# Patient Record
Sex: Male | Born: 1965 | Hispanic: No | Marital: Married | Smoking: Never smoker
Health system: Southern US, Community
[De-identification: ages and names within clinical notes are randomized; demographics above are authoritative.]

## PROBLEM LIST (undated history)

## (undated) DIAGNOSIS — E785 Hyperlipidemia, unspecified: Secondary | ICD-10-CM

## (undated) DIAGNOSIS — R202 Paresthesia of skin: Secondary | ICD-10-CM

## (undated) DIAGNOSIS — K838 Other specified diseases of biliary tract: Secondary | ICD-10-CM

## (undated) DIAGNOSIS — R200 Anesthesia of skin: Secondary | ICD-10-CM

## (undated) DIAGNOSIS — R7303 Prediabetes: Secondary | ICD-10-CM

## (undated) DIAGNOSIS — I10 Essential (primary) hypertension: Secondary | ICD-10-CM

## (undated) DIAGNOSIS — R51 Headache: Secondary | ICD-10-CM

## (undated) DIAGNOSIS — F419 Anxiety disorder, unspecified: Secondary | ICD-10-CM

## (undated) HISTORY — PX: HAND SURGERY: SHX662

## (undated) HISTORY — DX: Hyperlipidemia, unspecified: E78.5

## (undated) HISTORY — PX: COLONOSCOPY: SHX174

## (undated) HISTORY — DX: Essential (primary) hypertension: I10

## (undated) HISTORY — DX: Other specified diseases of biliary tract: K83.8

## (undated) HISTORY — DX: Prediabetes: R73.03

## (undated) HISTORY — DX: Anxiety disorder, unspecified: F41.9

## (undated) SURGERY — Surgical Case
Anesthesia: *Unknown

---

## 2000-03-24 ENCOUNTER — Emergency Department (HOSPITAL_COMMUNITY): Admission: EM | Admit: 2000-03-24 | Discharge: 2000-03-24 | Payer: Self-pay

## 2000-03-24 ENCOUNTER — Encounter: Payer: Self-pay | Admitting: Emergency Medicine

## 2009-04-19 ENCOUNTER — Emergency Department (HOSPITAL_COMMUNITY): Admission: EM | Admit: 2009-04-19 | Discharge: 2009-04-20 | Payer: Self-pay | Admitting: Emergency Medicine

## 2012-06-16 ENCOUNTER — Ambulatory Visit (INDEPENDENT_AMBULATORY_CARE_PROVIDER_SITE_OTHER): Payer: BC Managed Care – PPO | Admitting: Family Medicine

## 2012-06-16 ENCOUNTER — Ambulatory Visit
Admission: RE | Admit: 2012-06-16 | Discharge: 2012-06-16 | Disposition: A | Discharge status: Home / Self Care | Payer: BC Managed Care – PPO | Source: Ambulatory Visit | Attending: Family Medicine | Admitting: Family Medicine

## 2012-06-16 VITALS — BP 170/120 | HR 88 | Temp 98.1°F | Resp 18 | Ht 62.0 in | Wt 163.0 lb

## 2012-06-16 DIAGNOSIS — R030 Elevated blood-pressure reading, without diagnosis of hypertension: Secondary | ICD-10-CM

## 2012-06-16 DIAGNOSIS — Z0000 Encounter for general adult medical examination without abnormal findings: Secondary | ICD-10-CM

## 2012-06-16 DIAGNOSIS — R1013 Epigastric pain: Secondary | ICD-10-CM

## 2012-06-16 DIAGNOSIS — IMO0001 Reserved for inherently not codable concepts without codable children: Secondary | ICD-10-CM

## 2012-06-16 LAB — POCT CBC
Granulocyte percent: 73.5 %G (ref 37–80)
HCT, POC: 52.2 % (ref 43.5–53.7)
Hemoglobin: 17.2 g/dL (ref 14.1–18.1)
Lymph, poc: 1.5 (ref 0.6–3.4)
MCH, POC: 30.2 pg (ref 27–31.2)
MCHC: 33 g/dL (ref 31.8–35.4)
MCV: 91.5 fL (ref 80–97)
MID (cbc): 0.4 (ref 0–0.9)
MPV: 8.3 fL (ref 0–99.8)
POC Granulocyte: 5.3 (ref 2–6.9)
POC LYMPH PERCENT: 20.3 %L (ref 10–50)
POC MID %: 6.2 %M (ref 0–12)
Platelet Count, POC: 274 10*3/uL (ref 142–424)
RBC: 5.7 M/uL (ref 4.69–6.13)
RDW, POC: 14.4 %
WBC: 7.2 10*3/uL (ref 4.6–10.2)

## 2012-06-16 LAB — COMPREHENSIVE METABOLIC PANEL
ALT: 172 U/L — ABNORMAL HIGH (ref 0–53)
AST: 118 U/L — ABNORMAL HIGH (ref 0–37)
Albumin: 4.5 g/dL (ref 3.5–5.2)
Alkaline Phosphatase: 76 U/L (ref 39–117)
BUN: 11 mg/dL (ref 6–23)
CO2: 27 mEq/L (ref 19–32)
Calcium: 9.8 mg/dL (ref 8.4–10.5)
Chloride: 103 mEq/L (ref 96–112)
Creat: 0.95 mg/dL (ref 0.50–1.35)
Glucose, Bld: 117 mg/dL — ABNORMAL HIGH (ref 70–99)
Potassium: 3.6 mEq/L (ref 3.5–5.3)
Sodium: 143 mEq/L (ref 135–145)
Total Bilirubin: 0.5 mg/dL (ref 0.3–1.2)
Total Protein: 7.5 g/dL (ref 6.0–8.3)

## 2012-06-16 LAB — POCT URINALYSIS DIPSTICK
Bilirubin, UA: NEGATIVE
Glucose, UA: NEGATIVE
Ketones, UA: NEGATIVE
Leukocytes, UA: NEGATIVE
Nitrite, UA: NEGATIVE
Protein, UA: 100
Spec Grav, UA: 1.02
Urobilinogen, UA: 0.2
pH, UA: 7

## 2012-06-16 LAB — POCT UA - MICROSCOPIC ONLY
Bacteria, U Microscopic: NEGATIVE
Casts, Ur, LPF, POC: NEGATIVE
Crystals, Ur, HPF, POC: NEGATIVE
Mucus, UA: NEGATIVE
Yeast, UA: NEGATIVE

## 2012-06-16 LAB — LIPID PANEL
Cholesterol: 269 mg/dL — ABNORMAL HIGH (ref 0–200)
HDL: 43 mg/dL (ref >39)
LDL Cholesterol: 180 mg/dL — ABNORMAL HIGH (ref 0–99)
Total CHOL/HDL Ratio: 6.3 Ratio
Triglycerides: 232 mg/dL — ABNORMAL HIGH (ref <150)
VLDL: 46 mg/dL — ABNORMAL HIGH (ref 0–40)

## 2012-06-16 MED ORDER — LISINOPRIL 40 MG PO TABS: 40.0000 mg | ORAL_TABLET | Freq: Every day | ORAL | Status: DC

## 2012-06-16 NOTE — Progress Notes (Signed)
Patient ID: Ethan Silva MRN: [id number], DOB: 1965/07/01 47 y.o. Date of Encounter: 06/16/2012, 10:48 AM  Primary Physician: No primary provider on file.  Chief Complaint: Physical (CPE)  HPI: 47 y.o. y/o male with history noted below here for CPE.  Works as a Education administrator Has had epigastric pain from time to time associated with nausea and vomiting, worse after eating.  Review of Systems: Consitutional: No fever, chills, fatigue, night sweats, lymphadenopathy, or weight changes. Eyes: No visual changes, eye redness, or discharge. ENT/Mouth: Ears: No otalgia, tinnitus, hearing loss, discharge. Nose: No congestion, rhinorrhea, sinus pain, or epistaxis. Throat: No sore throat, post nasal drip, or teeth pain. Cardiovascular: No CP, palpitations, diaphoresis, DOE, edema, orthopnea, PND. Respiratory: No cough, hemoptysis, SOB, or wheezing. Gastrointestinal: No anorexia, dysphagia, reflux, pain,  hematemesis, diarrhea, constipation, BRBPR, or melena. Genitourinary: No dysuria, frequency, urgency, hematuria, incontinence, nocturia, decreased urinary stream, discharge, impotence, or testicular pain/masses. Musculoskeletal: No decreased ROM, myalgias, stiffness, joint swelling, or weakness. Skin: No rash, erythema, lesion changes, pain, warmth, jaundice, or pruritis. Neurological: No headache, dizziness, syncope, seizures, tremors, memory loss, coordination problems, or paresthesias. Psychological: No anxiety, depression, hallucinations, SI/HI. Endocrine: No fatigue, polydipsia, polyphagia, polyuria, or known diabetes. All other systems were reviewed and are otherwise negative.  Past Medical History  Diagnosis Date  . Hyperlipidemia   . Hypertension      History reviewed. No pertinent past surgical history.  Home Meds:  Prior to Admission medications   Medication Sig Start Date End Date Taking? Authorizing Provider  pravastatin (PRAVACHOL) 40 MG tablet Take 40 mg by mouth daily.    Yes Historical Provider, MD    Allergies: No Known Allergies  History   Social History  . Marital Status: Married    Spouse Name: N/A    Number of Children: N/A  . Years of Education: N/A   Occupational History  . Not on file.   Social History Main Topics  . Smoking status: Never Smoker   . Smokeless tobacco: Not on file  . Alcohol Use: No  . Drug Use: No  . Sexually Active: Not on file   Other Topics Concern  . Not on file   Social History Narrative  . No narrative on file    History reviewed. No pertinent family history.  Physical Exam:  200/118 rcheck BP Blood pressure 170/120, pulse 88, temperature 98.1 F (36.7 C), temperature source Oral, resp. rate 18, height 5\' 2"  (1.575 m), weight 163 lb (73.936 kg).  General: Well developed, well nourished, in no acute distress. HEENT: Normocephalic, atraumatic. Conjunctiva pink, sclera non-icteric. Pupils 2 mm constricting to 1 mm, round, regular, and equally reactive to light and accomodation. EOMI. Internal auditory canal clear. TMs with good cone of light and without pathology. Nasal mucosa pink. Nares are without discharge. No sinus tenderness. Oral mucosa pink. Dentition good. Pharynx without exudate.   Neck: Supple. Trachea midline. No thyromegaly. Full ROM. No lymphadenopathy. Lungs: Clear to auscultation bilaterally without wheezes, rales, or rhonchi. Breathing is of normal effort and unlabored. Cardiovascular: RRR with S1 S2. No murmurs, rubs, or gallops appreciated. Distal pulses 2+ symmetrically. No carotid or abdominal bruits Abdomen: Soft, non-distended with normoactive bowel sounds. No hepatosplenomegaly or masses. No rebound/guarding. No CVA tenderness. Without hernias.  Genitourinary:  uncircumcised male. No penile lesions. Testes descended bilaterally, and smooth without tenderness or masses.  Musculoskeletal: Full range of motion and 5/5 strength throughout. Without swelling, atrophy, tenderness, crepitus, or  warmth. Extremities without clubbing, cyanosis, or edema.  Calves supple. Skin: Warm and moist without erythema, ecchymosis, wounds, or rash. Neuro: A+Ox3. CN II-XII grossly intact. Moves all extremities spontaneously. Full sensation throughout. Normal gait. DTR 2+ throughout upper and lower extremities. Finger to nose intact. Psych:  Responds to questions appropriately with a normal affect.   Studies: CBC, CMET, Lipid, UA:  Ekg:  No acute changes  Assessment/Plan:  47 y.o. y/o  male here for CPE -  Signed, Elvina Sidle, MD 06/16/2012 10:48 AM

## 2012-06-17 ENCOUNTER — Other Ambulatory Visit: Payer: Self-pay | Admitting: Family Medicine

## 2012-06-17 DIAGNOSIS — K8020 Calculus of gallbladder without cholecystitis without obstruction: Secondary | ICD-10-CM

## 2012-06-26 ENCOUNTER — Encounter (INDEPENDENT_AMBULATORY_CARE_PROVIDER_SITE_OTHER): Payer: Self-pay | Admitting: General Surgery

## 2012-06-26 ENCOUNTER — Ambulatory Visit (INDEPENDENT_AMBULATORY_CARE_PROVIDER_SITE_OTHER): Payer: BC Managed Care – PPO | Admitting: General Surgery

## 2012-06-26 VITALS — BP 142/98 | HR 62 | Temp 98.0°F | Resp 18 | Ht 62.0 in | Wt 163.0 lb

## 2012-06-26 DIAGNOSIS — K8020 Calculus of gallbladder without cholecystitis without obstruction: Secondary | ICD-10-CM

## 2012-06-26 NOTE — Progress Notes (Signed)
Patient ID: Ethan Silva, male   DOB: April 08, 1965, 47 y.o.   MRN: [id number]  Chief Complaint  Patient presents with  . New Evaluation    Gallbladder    HPI Ethan Silva is a 47 y.o. male.  Is a 47 year old male who is referred by Dr. Milus Glazier for evaluation of gallstones. The patient states that after 2-3. High-fat foods, or spicy foods, he has epigastric pain. This has occurred over the last 2-3 months, with increasing frequency. The patient does have some nausea that time. HPI  Past Medical History  Diagnosis Date  . Hyperlipidemia   . Hypertension     History reviewed. No pertinent past surgical history.  Family History  Problem Relation Age of Onset  . Gallstones Mother     Social History History  Substance Use Topics  . Smoking status: Never Smoker   . Smokeless tobacco: Not on file  . Alcohol Use: No    No Known Allergies  Current Outpatient Prescriptions  Medication Sig Dispense Refill  . lisinopril (PRINIVIL,ZESTRIL) 40 MG tablet Take 1 tablet (40 mg total) by mouth daily.  90 tablet  3  . pravastatin (PRAVACHOL) 40 MG tablet Take 40 mg by mouth daily.       No current facility-administered medications for this visit.    Review of Systems Review of Systems  Constitutional: Negative.   HENT: Negative.   Respiratory: Negative.   Cardiovascular: Negative.   Gastrointestinal: Positive for abdominal pain.  Musculoskeletal: Negative.   Neurological: Negative.   All other systems reviewed and are negative.    Blood pressure 142/98, pulse 62, temperature 98 F (36.7 C), resp. rate 18, height 5\' 2"  (1.575 m), weight 163 lb (73.936 kg).  Physical Exam Physical Exam  Constitutional: He is oriented to person, place, and time. He appears well-developed and well-nourished.  HENT:  Head: Normocephalic and atraumatic.  Eyes: Conjunctivae and EOM are normal. Pupils are equal, round, and reactive to light.  Neck: Normal range of motion. Neck  supple.  Cardiovascular: Normal rate, regular rhythm and normal heart sounds.   Pulmonary/Chest: Effort normal and breath sounds normal.  Abdominal: Soft. Bowel sounds are normal. There is no tenderness. There is no rebound.  Musculoskeletal: Normal range of motion.  Neurological: He is alert and oriented to person, place, and time.  Skin: Skin is warm and dry.    Data Reviewed AST and ALT slightly elevated. T. Bili within normal limits, alkaline phosphatase normal limits  Ultrasound reveals gallstones  Assessment    47 year old male with symptomatic cholelithiasis     Plan    1. We'll proceed to the operating room for a laparoscopic cholecystectomy with IOC. 2.All risks and benefits were discussed with the patient to generally include: infection, bleeding, possible need for post op ERCP, damage to the bile ducts, and bile leak. Alternatives were offered and described.  All questions were answered and the patient voiced understanding of the procedure and wishes to proceed at this point with a laparoscopic cholecystectomy         Ethan Silva., Ethan Silva 06/26/2012, 9:20 AM

## 2012-07-01 DIAGNOSIS — I350 Nonrheumatic aortic (valve) stenosis: Secondary | ICD-10-CM

## 2012-07-01 HISTORY — DX: Nonrheumatic aortic (valve) stenosis: I35.0

## 2012-07-09 ENCOUNTER — Ambulatory Visit: Admit: 2012-07-09 | Payer: Self-pay | Admitting: General Surgery

## 2012-07-09 SURGERY — LAPAROSCOPIC CHOLECYSTECTOMY WITH INTRAOPERATIVE CHOLANGIOGRAM
Anesthesia: General

## 2012-07-10 ENCOUNTER — Encounter (HOSPITAL_COMMUNITY): Payer: Self-pay

## 2012-07-10 ENCOUNTER — Encounter (HOSPITAL_COMMUNITY)
Admission: RE | Admit: 2012-07-10 | Discharge: 2012-07-10 | Disposition: A | Discharge status: Home / Self Care | Payer: BC Managed Care – PPO | Source: Ambulatory Visit | Attending: General Surgery | Admitting: General Surgery

## 2012-07-10 ENCOUNTER — Ambulatory Visit (HOSPITAL_COMMUNITY)
Admission: RE | Admit: 2012-07-10 | Discharge: 2012-07-10 | Disposition: A | Discharge status: Home / Self Care | Payer: BC Managed Care – PPO | Source: Ambulatory Visit | Attending: Anesthesiology | Admitting: Anesthesiology

## 2012-07-10 DIAGNOSIS — Z01812 Encounter for preprocedural laboratory examination: Secondary | ICD-10-CM | POA: Insufficient documentation

## 2012-07-10 DIAGNOSIS — Z01818 Encounter for other preprocedural examination: Secondary | ICD-10-CM | POA: Insufficient documentation

## 2012-07-10 DIAGNOSIS — I10 Essential (primary) hypertension: Secondary | ICD-10-CM | POA: Insufficient documentation

## 2012-07-10 HISTORY — DX: Paresthesia of skin: R20.0

## 2012-07-10 HISTORY — DX: Headache: R51

## 2012-07-10 HISTORY — DX: Anesthesia of skin: R20.2

## 2012-07-10 LAB — CBC
HCT: 45.4 % (ref 39.0–52.0)
Hemoglobin: 16.1 g/dL (ref 13.0–17.0)
MCH: 30.1 pg (ref 26.0–34.0)
MCHC: 35.5 g/dL (ref 30.0–36.0)
MCV: 85 fL (ref 78.0–100.0)
Platelets: 203 10*3/uL (ref 150–400)
RBC: 5.34 MIL/uL (ref 4.22–5.81)
RDW: 13.1 % (ref 11.5–15.5)
WBC: 8.7 10*3/uL (ref 4.0–10.5)

## 2012-07-10 LAB — BASIC METABOLIC PANEL
BUN: 17 mg/dL (ref 6–23)
CO2: 27 mEq/L (ref 19–32)
Calcium: 9.8 mg/dL (ref 8.4–10.5)
Chloride: 102 mEq/L (ref 96–112)
Creatinine, Ser: 1.03 mg/dL (ref 0.50–1.35)
GFR calc Af Amer: >90 mL/min (ref >90)
GFR calc non Af Amer: 84 mL/min — ABNORMAL LOW (ref >90)
Glucose, Bld: 104 mg/dL — ABNORMAL HIGH (ref 70–99)
Potassium: 3.6 mEq/L (ref 3.5–5.1)
Sodium: 141 mEq/L (ref 135–145)

## 2012-07-10 MED ORDER — CEFAZOLIN SODIUM-DEXTROSE 2-3 GM-% IV SOLR
2.0000 g | INTRAVENOUS | Status: AC
  Administered 2012-07-11: 2 g via INTRAVENOUS
  Filled 2012-07-10: qty 50

## 2012-07-10 NOTE — Pre-Procedure Instructions (Signed)
Ethan Silva  07/10/2012   Your procedure is scheduled on: Friday, July 11t   Report to [facility] at  5:30 AM.             Come into Entrance "A", follow signs to EAST Elevators and go to 3rd floor.   Call this number if you have problems the morning of surgery: [phone]   Remember:   Do not eat food or drink liquids after midnight Thursday.   Take these medicines the morning of surgery with A SIP OF WATER: None   Do not wear jewelry.  Do not wear lotions, powders, or colognes. You may NOT wear deodorant.   Men may shave face and neck.   Do not bring valuables to the hospital.  [facility] is not responsible for any belongings or valuables.  Contacts, dentures or bridgework may not be worn into surgery.  Leave suitcase in the car. After surgery it may be brought to your room.   For patients admitted to the hospital, checkout time is 11:00 AM the day of discharge.   Patients discharged the day of surgery will not be allowed to drive home,             And will need a adult to stay with you for the first 24 hrs after surgery.   Name and phone number of your driver:    Special Instructions: Shower using CHG 2 nights before surgery and the night before surgery.  If you shower the day of surgery use CHG.  Use special wash - you have one bottle of CHG for all showers.  You should use approximately 1/3 of the bottle for each shower.   Please read over the following fact sheets that you were given: Pain Booklet and Surgical Site Infection Prevention

## 2012-07-10 NOTE — Progress Notes (Signed)
Pt denies SOB, chest pain, being under the care of a cardiologist, having a stress, echo, or cardiac cath done.

## 2012-07-11 ENCOUNTER — Encounter (HOSPITAL_COMMUNITY): Payer: Self-pay | Admitting: Critical Care Medicine

## 2012-07-11 ENCOUNTER — Ambulatory Visit (HOSPITAL_COMMUNITY): Payer: BC Managed Care – PPO

## 2012-07-11 ENCOUNTER — Ambulatory Visit (HOSPITAL_COMMUNITY)
Admission: RE | Admit: 2012-07-11 | Discharge: 2012-07-11 | Disposition: A | Discharge status: Home / Self Care | Payer: BC Managed Care – PPO | Source: Ambulatory Visit | Attending: General Surgery | Admitting: General Surgery

## 2012-07-11 ENCOUNTER — Encounter (HOSPITAL_COMMUNITY)
Admission: RE | Disposition: A | Discharge status: Home / Self Care | Payer: Self-pay | Source: Ambulatory Visit | Attending: General Surgery

## 2012-07-11 ENCOUNTER — Ambulatory Visit (HOSPITAL_COMMUNITY): Payer: BC Managed Care – PPO | Admitting: Critical Care Medicine

## 2012-07-11 DIAGNOSIS — K219 Gastro-esophageal reflux disease without esophagitis: Secondary | ICD-10-CM | POA: Insufficient documentation

## 2012-07-11 DIAGNOSIS — Z79899 Other long term (current) drug therapy: Secondary | ICD-10-CM | POA: Insufficient documentation

## 2012-07-11 DIAGNOSIS — K8010 Calculus of gallbladder with chronic cholecystitis without obstruction: Secondary | ICD-10-CM

## 2012-07-11 DIAGNOSIS — K8020 Calculus of gallbladder without cholecystitis without obstruction: Secondary | ICD-10-CM | POA: Insufficient documentation

## 2012-07-11 DIAGNOSIS — E785 Hyperlipidemia, unspecified: Secondary | ICD-10-CM | POA: Insufficient documentation

## 2012-07-11 DIAGNOSIS — I10 Essential (primary) hypertension: Secondary | ICD-10-CM | POA: Insufficient documentation

## 2012-07-11 HISTORY — PX: CHOLECYSTECTOMY: SHX55

## 2012-07-11 SURGERY — LAPAROSCOPIC CHOLECYSTECTOMY WITH INTRAOPERATIVE CHOLANGIOGRAM
Anesthesia: General | Site: Abdomen | Wound class: Clean Contaminated

## 2012-07-11 MED ORDER — HYDROMORPHONE HCL PF 1 MG/ML IJ SOLN
0.2500 mg | INTRAMUSCULAR | Status: DC | PRN
  Administered 2012-07-11 (×2): 0.5 mg via INTRAVENOUS

## 2012-07-11 MED ORDER — LIDOCAINE HCL 4 % MT SOLN
OROMUCOSAL | Status: DC | PRN
  Administered 2012-07-11: 4 mL via TOPICAL

## 2012-07-11 MED ORDER — 0.9 % SODIUM CHLORIDE (POUR BTL) OPTIME
TOPICAL | Status: DC | PRN
  Administered 2012-07-11: 1000 mL

## 2012-07-11 MED ORDER — FENTANYL CITRATE 0.05 MG/ML IJ SOLN
INTRAMUSCULAR | Status: DC | PRN
  Administered 2012-07-11: 50 ug via INTRAVENOUS
  Administered 2012-07-11: 100 ug via INTRAVENOUS

## 2012-07-11 MED ORDER — LIDOCAINE HCL (CARDIAC) 20 MG/ML IV SOLN
INTRAVENOUS | Status: DC | PRN
  Administered 2012-07-11: 60 mg via INTRAVENOUS

## 2012-07-11 MED ORDER — NEOSTIGMINE METHYLSULFATE 1 MG/ML IJ SOLN
INTRAMUSCULAR | Status: DC | PRN
  Administered 2012-07-11: 3 mg via INTRAVENOUS

## 2012-07-11 MED ORDER — ONDANSETRON HCL 4 MG/2ML IJ SOLN
INTRAMUSCULAR | Status: DC | PRN
  Administered 2012-07-11: 4 mg via INTRAVENOUS

## 2012-07-11 MED ORDER — SODIUM CHLORIDE 0.9 % IR SOLN
Status: DC | PRN
  Administered 2012-07-11: 1000 mL

## 2012-07-11 MED ORDER — ROCURONIUM BROMIDE 100 MG/10ML IV SOLN
INTRAVENOUS | Status: DC | PRN
  Administered 2012-07-11: 10 mg via INTRAVENOUS
  Administered 2012-07-11: 30 mg via INTRAVENOUS

## 2012-07-11 MED ORDER — IOHEXOL 300 MG/ML  SOLN
INTRAMUSCULAR | Status: DC | PRN
  Administered 2012-07-11: 50 mL

## 2012-07-11 MED ORDER — OXYCODONE HCL 5 MG PO TABS: 5.0000 mg | ORAL_TABLET | ORAL | Status: DC | PRN

## 2012-07-11 MED ORDER — LACTATED RINGERS IV SOLN: INTRAVENOUS | Status: DC | PRN

## 2012-07-11 MED ORDER — SODIUM CHLORIDE 0.9 % IJ SOLN: 3.0000 mL | Freq: Two times a day (BID) | INTRAMUSCULAR | Status: DC

## 2012-07-11 MED ORDER — ACETAMINOPHEN 325 MG PO TABS: 650.0000 mg | ORAL_TABLET | ORAL | Status: DC | PRN

## 2012-07-11 MED ORDER — ONDANSETRON HCL 4 MG/2ML IJ SOLN: 4.0000 mg | Freq: Once | INTRAMUSCULAR | Status: DC | PRN

## 2012-07-11 MED ORDER — ONDANSETRON HCL 4 MG/2ML IJ SOLN: 4.0000 mg | Freq: Four times a day (QID) | INTRAMUSCULAR | Status: DC | PRN

## 2012-07-11 MED ORDER — BUPIVACAINE HCL (PF) 0.25 % IJ SOLN
INTRAMUSCULAR | Status: AC
  Filled 2012-07-11: qty 30

## 2012-07-11 MED ORDER — SODIUM CHLORIDE 0.9 % IJ SOLN: 3.0000 mL | INTRAMUSCULAR | Status: DC | PRN

## 2012-07-11 MED ORDER — MIDAZOLAM HCL 5 MG/5ML IJ SOLN
INTRAMUSCULAR | Status: DC | PRN
  Administered 2012-07-11: 2 mg via INTRAVENOUS

## 2012-07-11 MED ORDER — PROPOFOL 10 MG/ML IV BOLUS
INTRAVENOUS | Status: DC | PRN
  Administered 2012-07-11: 200 mg via INTRAVENOUS

## 2012-07-11 MED ORDER — PHENYLEPHRINE HCL 10 MG/ML IJ SOLN
INTRAMUSCULAR | Status: DC | PRN
  Administered 2012-07-11: 80 ug via INTRAVENOUS
  Administered 2012-07-11: 120 ug via INTRAVENOUS
  Administered 2012-07-11 (×4): 80 ug via INTRAVENOUS

## 2012-07-11 MED ORDER — SODIUM CHLORIDE 0.9 % IV SOLN: 250.0000 mL | INTRAVENOUS | Status: DC | PRN

## 2012-07-11 MED ORDER — CHLORHEXIDINE GLUCONATE 4 % EX LIQD: 1.0000 "application " | Freq: Once | CUTANEOUS | Status: DC

## 2012-07-11 MED ORDER — HYDROMORPHONE HCL PF 1 MG/ML IJ SOLN
INTRAMUSCULAR | Status: AC
  Filled 2012-07-11: qty 1

## 2012-07-11 MED ORDER — GLYCOPYRROLATE 0.2 MG/ML IJ SOLN
INTRAMUSCULAR | Status: DC | PRN
  Administered 2012-07-11: 0.1 mg via INTRAVENOUS
  Administered 2012-07-11: 0.4 mg via INTRAVENOUS
  Administered 2012-07-11: 0.1 mg via INTRAVENOUS

## 2012-07-11 MED ORDER — SUCCINYLCHOLINE CHLORIDE 20 MG/ML IJ SOLN
INTRAMUSCULAR | Status: DC | PRN
  Administered 2012-07-11: 80 mg via INTRAVENOUS

## 2012-07-11 MED ORDER — BUPIVACAINE HCL 0.25 % IJ SOLN
INTRAMUSCULAR | Status: DC | PRN
  Administered 2012-07-11: 30 mL

## 2012-07-11 MED ORDER — OXYCODONE-ACETAMINOPHEN 10-325 MG PO TABS: 1.0000 | ORAL_TABLET | ORAL | Status: DC | PRN

## 2012-07-11 MED ORDER — ACETAMINOPHEN 650 MG RE SUPP: 650.0000 mg | RECTAL | Status: DC | PRN

## 2012-07-11 SURGICAL SUPPLY — 53 items
APL SKNCLS STERI-STRIP NONHPOA (GAUZE/BANDAGES/DRESSINGS) ×1
APPLIER CLIP 5 13 M/L LIGAMAX5 (MISCELLANEOUS) ×2
APR CLP MED LRG 5 ANG JAW (MISCELLANEOUS) ×1
BAG SPEC RTRVL LRG 6X4 10 (ENDOMECHANICALS)
BENZOIN TINCTURE PRP APPL 2/3 (GAUZE/BANDAGES/DRESSINGS) ×2 IMPLANT
CANISTER SUCTION 2500CC (MISCELLANEOUS) ×2 IMPLANT
CHLORAPREP W/TINT 26ML (MISCELLANEOUS) ×2 IMPLANT
CLIP APPLIE 5 13 M/L LIGAMAX5 (MISCELLANEOUS) ×1 IMPLANT
CLOTH BEACON ORANGE TIMEOUT ST (SAFETY) ×2 IMPLANT
COVER MAYO STAND STRL (DRAPES) ×2 IMPLANT
COVER SURGICAL LIGHT HANDLE (MISCELLANEOUS) ×2 IMPLANT
COVER TRANSDUCER ULTRASND (DRAPES) ×1 IMPLANT
DEVICE TROCAR PUNCTURE CLOSURE (ENDOMECHANICALS) ×2 IMPLANT
DRAPE C-ARM 42X72 X-RAY (DRAPES) ×2 IMPLANT
DRAPE UTILITY 15X26 W/TAPE STR (DRAPE) ×4 IMPLANT
ELECT REM PT RETURN 9FT ADLT (ELECTROSURGICAL) ×2
ELECTRODE REM PT RTRN 9FT ADLT (ELECTROSURGICAL) ×1 IMPLANT
GAUZE SPONGE 2X2 8PLY STRL LF (GAUZE/BANDAGES/DRESSINGS) ×1 IMPLANT
GLOVE BIO SURGEON STRL SZ7.5 (GLOVE) ×2 IMPLANT
GLOVE BIOGEL PI IND STRL 6.5 (GLOVE) IMPLANT
GLOVE BIOGEL PI IND STRL 7.0 (GLOVE) IMPLANT
GLOVE BIOGEL PI INDICATOR 6.5 (GLOVE) ×1
GLOVE BIOGEL PI INDICATOR 7.0 (GLOVE) ×2
GLOVE SKINSENSE NS SZ6.5 (GLOVE) ×1
GLOVE SKINSENSE NS SZ7.0 (GLOVE) ×2
GLOVE SKINSENSE STRL SZ6.5 (GLOVE) IMPLANT
GLOVE SKINSENSE STRL SZ7.0 (GLOVE) IMPLANT
GOWN STRL NON-REIN LRG LVL3 (GOWN DISPOSABLE) ×6 IMPLANT
GOWN STRL REIN XL XLG (GOWN DISPOSABLE) ×2 IMPLANT
IV CATH 14GX2 1/4 (CATHETERS) ×2 IMPLANT
KIT BASIN OR (CUSTOM PROCEDURE TRAY) ×2 IMPLANT
KIT ROOM TURNOVER OR (KITS) ×2 IMPLANT
NDL INSUFFLATION 14GA 120MM (NEEDLE) ×1 IMPLANT
NEEDLE INSUFFLATION 14GA 120MM (NEEDLE) ×2 IMPLANT
NS IRRIG 1000ML POUR BTL (IV SOLUTION) ×2 IMPLANT
PAD ARMBOARD 7.5X6 YLW CONV (MISCELLANEOUS) ×4 IMPLANT
POUCH SPECIMEN RETRIEVAL 10MM (ENDOMECHANICALS) IMPLANT
SCISSORS LAP 5X35 DISP (ENDOMECHANICALS) ×2 IMPLANT
SET CHOLANGIOGRAPHY FRANKLIN (SET/KITS/TRAYS/PACK) ×2 IMPLANT
SET IRRIG TUBING LAPAROSCOPIC (IRRIGATION / IRRIGATOR) ×2 IMPLANT
SLEEVE ENDOPATH XCEL 5M (ENDOMECHANICALS) ×2 IMPLANT
SPECIMEN JAR SMALL (MISCELLANEOUS) ×2 IMPLANT
SPONGE GAUZE 2X2 STER 10/PKG (GAUZE/BANDAGES/DRESSINGS) ×1
SPONGE GAUZE 4X4 12PLY (GAUZE/BANDAGES/DRESSINGS) ×1 IMPLANT
STRIP CLOSURE SKIN 1/2X4 (GAUZE/BANDAGES/DRESSINGS) ×1 IMPLANT
SUT MNCRL AB 3-0 PS2 18 (SUTURE) ×2 IMPLANT
SUT VICRYL 0 UR6 27IN ABS (SUTURE) ×1 IMPLANT
TAPE CLOTH SURG 4X10 WHT LF (GAUZE/BANDAGES/DRESSINGS) ×1 IMPLANT
TOWEL OR 17X24 6PK STRL BLUE (TOWEL DISPOSABLE) ×2 IMPLANT
TOWEL OR 17X26 10 PK STRL BLUE (TOWEL DISPOSABLE) ×2 IMPLANT
TRAY LAPAROSCOPIC (CUSTOM PROCEDURE TRAY) ×2 IMPLANT
TROCAR XCEL NON-BLD 11X100MML (ENDOMECHANICALS) ×2 IMPLANT
TROCAR XCEL NON-BLD 5MMX100MML (ENDOMECHANICALS) ×2 IMPLANT

## 2012-07-11 NOTE — Op Note (Signed)
   Pre Operative Diagnosis:  Symptomatic gallstones  Post Operative Diagnosis: same  Procedure: Lap chole with IOC  Surgeon: Dr. Axel Filler  Assistant: none  Anesthesia: GETA  EBL: 5cc  Complications: none  Counts: reported as correct x 2  Findings:  Normal IOC.  Moderately inflammed GB  Indications for procedure:  Pt is a 47 y/o M with several month h/o RUQ abd pain after fatty meals.  Pt was dx with gallstones and seen and counseled in clinic.  He decided to have this gallbladder electively removed.  Details of the procedure:  The patient was taken to the operating and placed in the supine position with bilateral SCDs in place. A time out was called and all facts were verified. A pneumoperitoneum was obtained via A Veress needle technique to a pressure of 14mm of mercury. A 5mm trochar was then placed in the right upper quadrant under visualization, and there were no injuries to any abdominal organs. A 11 mm port was then placed in the umbilical region after infiltrating with local anesthesia under direct visualization. A second epigastric port was placed under direct visualization. The gallbladder was identified and retracted, the peritoneum was then sharply dissected from the gallbladder and this dissection was carried down to Calot's triangle. The cystic duct was identified and stripped away circumferentially and seen going into the gallbladder 360, and the critical angle was obtained. A Cook catheter was used to perform an intraoperative cholangiogram. The biliary radicals as well as the cystic duct and common bile duct were seen free of filling defects.  2 clips were placed proximally one distally and the cystic duct transected. The cystic artery was identified and 2 clips placed proximally and one distally and transected.  We then proceeded to remove the gallbladder off the hepatic fossa with Bovie cautery. A latex retrieval bag was then placed in the abdomen and gallbladder  placed in the bag. The hepatic fossa was then reexamined and hemostasis was achieved with laparoscopic specula and Bovie cautery and was excellent at this portion of the case. The subhepatic fossa and perihepatic fossa was then irrigated until the effluent was clear. The 11 mm trocar fascia was reapproximated with the Endo Close #1 Vicryl x3.  The pneumoperitoneum was evacuated and all trochars removed under direct visulalization.  The skin was then closed with 4-0 Monocryl and the skin dressed with Steri-Strips, gauze, and tape.  The patient was awaken from general anesthesia and taken to the recovery room in stable condition.

## 2012-07-11 NOTE — Anesthesia Postprocedure Evaluation (Signed)
  Anesthesia Post-op Note  Patient: Ethan Silva  Procedure(s) Performed: Procedure(s): LAPAROSCOPIC CHOLECYSTECTOMY WITH INTRAOPERATIVE CHOLANGIOGRAM (N/A)  Patient Location: PACU  Anesthesia Type:General  Level of Consciousness: awake, oriented and patient cooperative  Airway and Oxygen Therapy: Patient Spontanous Breathing  Post-op Pain: mild  Post-op Assessment: Post-op Vital signs reviewed, Patient's Cardiovascular Status Stable, Respiratory Function Stable, Patent Airway, No signs of Nausea or vomiting and Pain level controlled  Post-op Vital Signs: stable  Complications: No apparent anesthesia complications

## 2012-07-11 NOTE — Interval H&P Note (Signed)
History and Physical Interval Note:  07/11/2012 7:19 AM  Ethan Silva  has presented today for surgery, with the diagnosis of GALLSTONES  The various methods of treatment have been discussed with the patient and family. After consideration of risks, benefits and other options for treatment, the patient has consented to  Procedure(s): LAPAROSCOPIC CHOLECYSTECTOMY WITH INTRAOPERATIVE CHOLANGIOGRAM (N/A) as a surgical intervention .  The patient's history has been reviewed, patient examined, no change in status, stable for surgery.  I have reviewed the patient's chart and labs.  Questions were answered to the patient's satisfaction.     [name]., [name]

## 2012-07-11 NOTE — Anesthesia Procedure Notes (Signed)
Procedure Name: Intubation Date/Time: 07/11/2012 7:27 AM Performed by: Elon Alas Pre-anesthesia Checklist: Patient identified, Timeout performed, Emergency Drugs available, Suction available and Patient being monitored Patient Re-evaluated:Patient Re-evaluated prior to inductionOxygen Delivery Method: Circle system utilized Preoxygenation: Pre-oxygenation with 100% oxygen Intubation Type: IV induction Ventilation: Mask ventilation without difficulty Laryngoscope Size: Mac and 4 Grade View: Grade III Tube type: Oral Tube size: 7.5 mm Number of attempts: 1 Airway Equipment and Method: Stylet and LTA kit utilized Placement Confirmation: positive ETCO2,  ETT inserted through vocal cords under direct vision and breath sounds checked- equal and bilateral Secured at: 23 cm Tube secured with: Tape Dental Injury: Teeth and Oropharynx as per pre-operative assessment

## 2012-07-11 NOTE — Preoperative (Signed)
Beta Blockers   Reason not to administer Beta Blockers:Not Applicable 

## 2012-07-11 NOTE — Anesthesia Preprocedure Evaluation (Addendum)
Anesthesia Evaluation  Patient identified by MRN, date of birth, ID band Patient awake    Reviewed: Allergy & Precautions, H&P , Patient's Chart, lab work & pertinent test results  Airway       Dental  (+) Dental Advisory Given   Pulmonary          Cardiovascular hypertension, Pt. on medications     Neuro/Psych  Headaches,    GI/Hepatic GERD-  ,  Endo/Other    Renal/GU      Musculoskeletal   Abdominal   Peds  Hematology   Anesthesia Other Findings   Reproductive/Obstetrics                           Anesthesia Physical Anesthesia Plan  ASA: II  Anesthesia Plan: General   Post-op Pain Management:    Induction: Intravenous  Airway Management Planned: Oral ETT  Additional Equipment:   Intra-op Plan:   Post-operative Plan: Extubation in OR  Informed Consent: I have reviewed the patients History and Physical, chart, labs and discussed the procedure including the risks, benefits and alternatives for the proposed anesthesia with the patient or authorized representative who has indicated his/her understanding and acceptance.   Dental advisory given  Plan Discussed with: Anesthesiologist, Surgeon and CRNA  Anesthesia Plan Comments:        Anesthesia Quick Evaluation

## 2012-07-11 NOTE — H&P (View-Only) (Signed)
Patient ID: Ethan Silva, male   DOB: 04/12/1965, 47 y.o.   MRN: [id number]  Chief Complaint  Patient presents with  . New Evaluation    Gallbladder    HPI Ethan Silva is a 47 y.o. male.  Is a 47-year-old male who is referred by Dr. Lauenstein for evaluation of gallstones. The patient states that after 2-3. High-fat foods, or spicy foods, he has epigastric pain. This has occurred over the last 2-3 months, with increasing frequency. The patient does have some nausea that time. HPI  Past Medical History  Diagnosis Date  . Hyperlipidemia   . Hypertension     History reviewed. No pertinent past surgical history.  Family History  Problem Relation Age of Onset  . Gallstones Mother     Social History History  Substance Use Topics  . Smoking status: Never Smoker   . Smokeless tobacco: Not on file  . Alcohol Use: No    No Known Allergies  Current Outpatient Prescriptions  Medication Sig Dispense Refill  . lisinopril (PRINIVIL,ZESTRIL) 40 MG tablet Take 1 tablet (40 mg total) by mouth daily.  90 tablet  3  . pravastatin (PRAVACHOL) 40 MG tablet Take 40 mg by mouth daily.       No current facility-administered medications for this visit.    Review of Systems Review of Systems  Constitutional: Negative.   HENT: Negative.   Respiratory: Negative.   Cardiovascular: Negative.   Gastrointestinal: Positive for abdominal pain.  Musculoskeletal: Negative.   Neurological: Negative.   All other systems reviewed and are negative.    Blood pressure 142/98, pulse 62, temperature 98 F (36.7 C), resp. rate 18, height 5' 2" (1.575 m), weight 163 lb (73.936 kg).  Physical Exam Physical Exam  Constitutional: He is oriented to person, place, and time. He appears well-developed and well-nourished.  HENT:  Head: Normocephalic and atraumatic.  Eyes: Conjunctivae and EOM are normal. Pupils are equal, round, and reactive to light.  Neck: Normal range of motion. Neck  supple.  Cardiovascular: Normal rate, regular rhythm and normal heart sounds.   Pulmonary/Chest: Effort normal and breath sounds normal.  Abdominal: Soft. Bowel sounds are normal. There is no tenderness. There is no rebound.  Musculoskeletal: Normal range of motion.  Neurological: He is alert and oriented to person, place, and time.  Skin: Skin is warm and dry.    Data Reviewed AST and ALT slightly elevated. T. Bili within normal limits, alkaline phosphatase normal limits  Ultrasound reveals gallstones  Assessment    47-year-old male with symptomatic cholelithiasis     Plan    1. We'll proceed to the operating room for a laparoscopic cholecystectomy with IOC. 2.All risks and benefits were discussed with the patient to generally include: infection, bleeding, possible need for post op ERCP, damage to the bile ducts, and bile leak. Alternatives were offered and described.  All questions were answered and the patient voiced understanding of the procedure and wishes to proceed at this point with a laparoscopic cholecystectomy         Ethan Booher Jr., Ethan Silva 06/26/2012, 9:20 AM    

## 2012-07-11 NOTE — Transfer of Care (Signed)
Immediate Anesthesia Transfer of Care Note  Patient: Ethan Silva  Procedure(s) Performed: Procedure(s): LAPAROSCOPIC CHOLECYSTECTOMY WITH INTRAOPERATIVE CHOLANGIOGRAM (N/A)  Patient Location: PACU  Anesthesia Type:General  Level of Consciousness: awake, alert  and oriented  Airway & Oxygen Therapy: Patient Spontanous Breathing and Patient connected to nasal cannula oxygen  Post-op Assessment: Report given to PACU RN, Post -op Vital signs reviewed and stable and Patient moving all extremities X 4  Post vital signs: Reviewed and stable  Complications: No apparent anesthesia complications

## 2012-07-15 ENCOUNTER — Encounter (HOSPITAL_COMMUNITY): Payer: Self-pay | Admitting: General Surgery

## 2012-07-16 ENCOUNTER — Observation Stay (HOSPITAL_COMMUNITY): Payer: BC Managed Care – PPO | Admitting: Anesthesiology

## 2012-07-16 ENCOUNTER — Encounter (HOSPITAL_COMMUNITY): Payer: Self-pay | Admitting: Anesthesiology

## 2012-07-16 ENCOUNTER — Encounter (HOSPITAL_COMMUNITY)
Admission: EM | Disposition: A | Discharge status: Home / Self Care | Payer: Self-pay | Source: Home / Self Care | Attending: General Surgery

## 2012-07-16 ENCOUNTER — Emergency Department (HOSPITAL_COMMUNITY): Payer: BC Managed Care – PPO

## 2012-07-16 ENCOUNTER — Observation Stay (HOSPITAL_COMMUNITY): Payer: BC Managed Care – PPO

## 2012-07-16 ENCOUNTER — Encounter (HOSPITAL_COMMUNITY): Payer: Self-pay | Admitting: Emergency Medicine

## 2012-07-16 ENCOUNTER — Inpatient Hospital Stay (HOSPITAL_COMMUNITY)
Admission: EM | Admit: 2012-07-16 | Discharge: 2012-07-18 | DRG: 188 | Disposition: A | Discharge status: Home / Self Care | Payer: BC Managed Care – PPO | Attending: General Surgery | Admitting: General Surgery

## 2012-07-16 DIAGNOSIS — K8020 Calculus of gallbladder without cholecystitis without obstruction: Secondary | ICD-10-CM | POA: Diagnosis present

## 2012-07-16 DIAGNOSIS — I10 Essential (primary) hypertension: Secondary | ICD-10-CM | POA: Diagnosis present

## 2012-07-16 DIAGNOSIS — E785 Hyperlipidemia, unspecified: Secondary | ICD-10-CM | POA: Diagnosis present

## 2012-07-16 DIAGNOSIS — Y9210 Unspecified residential institution as the place of occurrence of the external cause: Secondary | ICD-10-CM | POA: Diagnosis present

## 2012-07-16 DIAGNOSIS — G8918 Other acute postprocedural pain: Secondary | ICD-10-CM | POA: Diagnosis present

## 2012-07-16 DIAGNOSIS — R109 Unspecified abdominal pain: Secondary | ICD-10-CM

## 2012-07-16 DIAGNOSIS — E876 Hypokalemia: Secondary | ICD-10-CM | POA: Diagnosis present

## 2012-07-16 DIAGNOSIS — K219 Gastro-esophageal reflux disease without esophagitis: Secondary | ICD-10-CM | POA: Diagnosis present

## 2012-07-16 DIAGNOSIS — K929 Disease of digestive system, unspecified: Principal | ICD-10-CM | POA: Diagnosis present

## 2012-07-16 DIAGNOSIS — Y838 Other surgical procedures as the cause of abnormal reaction of the patient, or of later complication, without mention of misadventure at the time of the procedure: Secondary | ICD-10-CM | POA: Diagnosis present

## 2012-07-16 DIAGNOSIS — K838 Other specified diseases of biliary tract: Secondary | ICD-10-CM

## 2012-07-16 HISTORY — PX: ERCP: SHX60

## 2012-07-16 HISTORY — PX: ERCP: SHX5425

## 2012-07-16 LAB — POCT I-STAT, CHEM 8
BUN: 20 mg/dL (ref 6–23)
Calcium, Ion: 1.2 mmol/L (ref 1.12–1.23)
Chloride: 102 mEq/L (ref 96–112)
Creatinine, Ser: 1.2 mg/dL (ref 0.50–1.35)
Glucose, Bld: 131 mg/dL — ABNORMAL HIGH (ref 70–99)
HCT: 53 % — ABNORMAL HIGH (ref 39.0–52.0)
Hemoglobin: 18 g/dL — ABNORMAL HIGH (ref 13.0–17.0)
Potassium: 3 mEq/L — ABNORMAL LOW (ref 3.5–5.1)
Sodium: 142 mEq/L (ref 135–145)
TCO2: 25 mmol/L (ref 0–100)

## 2012-07-16 LAB — URINALYSIS, ROUTINE W REFLEX MICROSCOPIC
Bilirubin Urine: NEGATIVE
Glucose, UA: NEGATIVE mg/dL
Ketones, ur: NEGATIVE mg/dL
Leukocytes, UA: NEGATIVE
Nitrite: NEGATIVE
Protein, ur: NEGATIVE mg/dL
Specific Gravity, Urine: 1.029 (ref 1.005–1.030)
Urobilinogen, UA: 0.2 mg/dL (ref 0.0–1.0)
pH: 6.5 (ref 5.0–8.0)

## 2012-07-16 LAB — SURGICAL PCR SCREEN
MRSA, PCR: NEGATIVE
Staphylococcus aureus: NEGATIVE

## 2012-07-16 LAB — URINE MICROSCOPIC-ADD ON

## 2012-07-16 LAB — CBC
HCT: 48.9 % (ref 39.0–52.0)
Hemoglobin: 18.1 g/dL — ABNORMAL HIGH (ref 13.0–17.0)
MCH: 31.6 pg (ref 26.0–34.0)
MCHC: 37 g/dL — ABNORMAL HIGH (ref 30.0–36.0)
MCV: 85.3 fL (ref 78.0–100.0)
Platelets: 257 10*3/uL (ref 150–400)
RBC: 5.73 MIL/uL (ref 4.22–5.81)
RDW: 13 % (ref 11.5–15.5)
WBC: 11.7 10*3/uL — ABNORMAL HIGH (ref 4.0–10.5)

## 2012-07-16 LAB — COMPREHENSIVE METABOLIC PANEL
ALT: 95 U/L — ABNORMAL HIGH (ref 0–53)
AST: 36 U/L (ref 0–37)
Albumin: 4.2 g/dL (ref 3.5–5.2)
Alkaline Phosphatase: 69 U/L (ref 39–117)
BUN: 19 mg/dL (ref 6–23)
CO2: 25 mEq/L (ref 19–32)
Calcium: 9.7 mg/dL (ref 8.4–10.5)
Chloride: 99 mEq/L (ref 96–112)
Creatinine, Ser: 1.07 mg/dL (ref 0.50–1.35)
GFR calc Af Amer: >90 mL/min (ref >90)
GFR calc non Af Amer: 80 mL/min — ABNORMAL LOW (ref >90)
Glucose, Bld: 128 mg/dL — ABNORMAL HIGH (ref 70–99)
Potassium: 3.1 mEq/L — ABNORMAL LOW (ref 3.5–5.1)
Sodium: 141 mEq/L (ref 135–145)
Total Bilirubin: 0.3 mg/dL (ref 0.3–1.2)
Total Protein: 7.6 g/dL (ref 6.0–8.3)

## 2012-07-16 LAB — LIPASE, BLOOD: Lipase: 37 U/L (ref 11–59)

## 2012-07-16 SURGERY — ERCP, WITH INTERVENTION IF INDICATED
Anesthesia: General

## 2012-07-16 SURGERY — ERCP, WITH INTERVENTION IF INDICATED
Anesthesia: Monitor Anesthesia Care

## 2012-07-16 MED ORDER — PIPERACILLIN-TAZOBACTAM 3.375 G IVPB
3.3750 g | Freq: Three times a day (TID) | INTRAVENOUS | Status: DC
  Administered 2012-07-16 – 2012-07-18 (×6): 3.375 g via INTRAVENOUS
  Filled 2012-07-16 (×10): qty 50

## 2012-07-16 MED ORDER — DEXTROSE-NACL 5-0.9 % IV SOLN: INTRAVENOUS | Status: DC

## 2012-07-16 MED ORDER — SODIUM CHLORIDE 0.9 % IV SOLN: INTRAVENOUS | Status: DC

## 2012-07-16 MED ORDER — FENTANYL CITRATE 0.05 MG/ML IJ SOLN
INTRAMUSCULAR | Status: AC
  Filled 2012-07-16: qty 2

## 2012-07-16 MED ORDER — LACTATED RINGERS IV SOLN: INTRAVENOUS | Status: DC | PRN

## 2012-07-16 MED ORDER — PHENYLEPHRINE HCL 10 MG/ML IJ SOLN
INTRAMUSCULAR | Status: DC | PRN
  Administered 2012-07-16: 80 ug via INTRAVENOUS
  Administered 2012-07-16 (×2): 40 ug via INTRAVENOUS
  Administered 2012-07-16: 80 ug via INTRAVENOUS

## 2012-07-16 MED ORDER — PANTOPRAZOLE SODIUM 40 MG IV SOLR
40.0000 mg | Freq: Every day | INTRAVENOUS | Status: DC
  Administered 2012-07-16 – 2012-07-17 (×2): 40 mg via INTRAVENOUS
  Filled 2012-07-16 (×3): qty 40

## 2012-07-16 MED ORDER — HYDROMORPHONE HCL PF 1 MG/ML IJ SOLN
1.0000 mg | INTRAMUSCULAR | Status: AC
  Administered 2012-07-16: 1 mg via INTRAVENOUS
  Filled 2012-07-16: qty 1

## 2012-07-16 MED ORDER — HYDROMORPHONE HCL PF 1 MG/ML IJ SOLN
1.0000 mg | Freq: Once | INTRAMUSCULAR | Status: AC
  Administered 2012-07-16: 1 mg via INTRAVENOUS
  Filled 2012-07-16: qty 1

## 2012-07-16 MED ORDER — LISINOPRIL 40 MG PO TABS
40.0000 mg | ORAL_TABLET | Freq: Every day | ORAL | Status: DC
  Administered 2012-07-16 – 2012-07-18 (×3): 40 mg via ORAL
  Filled 2012-07-16: qty 2
  Filled 2012-07-16 (×2): qty 1

## 2012-07-16 MED ORDER — OXYCODONE HCL 5 MG PO TABS: 5.0000 mg | ORAL_TABLET | Freq: Once | ORAL | Status: DC | PRN

## 2012-07-16 MED ORDER — ARTIFICIAL TEARS OP OINT
TOPICAL_OINTMENT | OPHTHALMIC | Status: DC | PRN
  Administered 2012-07-16: 1 via OPHTHALMIC

## 2012-07-16 MED ORDER — SODIUM CHLORIDE 0.9 % IV SOLN
250.0000 mg | Freq: Once | INTRAVENOUS | Status: DC
  Filled 2012-07-16 (×2): qty 250

## 2012-07-16 MED ORDER — FENTANYL CITRATE 0.05 MG/ML IJ SOLN
INTRAMUSCULAR | Status: DC | PRN
  Administered 2012-07-16 (×3): 50 ug via INTRAVENOUS

## 2012-07-16 MED ORDER — SODIUM CHLORIDE 0.9 % IV SOLN: INTRAVENOUS | Status: DC | PRN

## 2012-07-16 MED ORDER — HYDROMORPHONE HCL PF 1 MG/ML IJ SOLN
1.0000 mg | INTRAMUSCULAR | Status: DC | PRN
  Administered 2012-07-16 (×2): 1 mg via INTRAVENOUS
  Filled 2012-07-16 (×2): qty 1

## 2012-07-16 MED ORDER — HYDROMORPHONE HCL PF 1 MG/ML IJ SOLN: 1.0000 mg | INTRAMUSCULAR | Status: DC | PRN

## 2012-07-16 MED ORDER — IOHEXOL 300 MG/ML  SOLN
25.0000 mL | INTRAMUSCULAR | Status: AC
  Administered 2012-07-16 (×2): 25 mL via ORAL

## 2012-07-16 MED ORDER — ONDANSETRON HCL 4 MG/2ML IJ SOLN
4.0000 mg | Freq: Once | INTRAMUSCULAR | Status: AC
  Administered 2012-07-16: 4 mg via INTRAVENOUS
  Filled 2012-07-16: qty 2

## 2012-07-16 MED ORDER — OXYCODONE HCL 5 MG/5ML PO SOLN: 5.0000 mg | Freq: Once | ORAL | Status: DC | PRN

## 2012-07-16 MED ORDER — ONDANSETRON HCL 4 MG/2ML IJ SOLN
INTRAMUSCULAR | Status: DC | PRN
  Administered 2012-07-16: 4 mg via INTRAVENOUS

## 2012-07-16 MED ORDER — METOCLOPRAMIDE HCL 5 MG/ML IJ SOLN: 10.0000 mg | Freq: Once | INTRAMUSCULAR | Status: DC | PRN

## 2012-07-16 MED ORDER — KCL IN DEXTROSE-NACL 40-5-0.9 MEQ/L-%-% IV SOLN
INTRAVENOUS | Status: DC
  Filled 2012-07-16 (×7): qty 1000

## 2012-07-16 MED ORDER — HYDROCODONE-ACETAMINOPHEN 5-325 MG PO TABS
1.0000 | ORAL_TABLET | ORAL | Status: DC | PRN
  Administered 2012-07-18: 2 via ORAL
  Filled 2012-07-16: qty 2

## 2012-07-16 MED ORDER — IOHEXOL 300 MG/ML  SOLN
80.0000 mL | Freq: Once | INTRAMUSCULAR | Status: AC | PRN
  Administered 2012-07-16: 80 mL via INTRAVENOUS

## 2012-07-16 MED ORDER — ONDANSETRON HCL 4 MG/2ML IJ SOLN
4.0000 mg | Freq: Four times a day (QID) | INTRAMUSCULAR | Status: DC | PRN
  Administered 2012-07-17 (×2): 4 mg via INTRAVENOUS
  Filled 2012-07-16 (×3): qty 2

## 2012-07-16 MED ORDER — ONDANSETRON HCL 4 MG/2ML IJ SOLN
4.0000 mg | Freq: Once | INTRAMUSCULAR | Status: AC
  Administered 2012-07-16: 4 mg via INTRAVENOUS

## 2012-07-16 MED ORDER — FENTANYL CITRATE 0.05 MG/ML IJ SOLN
25.0000 ug | INTRAMUSCULAR | Status: DC | PRN
  Administered 2012-07-16 (×2): 50 ug via INTRAVENOUS

## 2012-07-16 MED ORDER — MIDAZOLAM HCL 5 MG/5ML IJ SOLN
INTRAMUSCULAR | Status: DC | PRN
  Administered 2012-07-16: 1 mg via INTRAVENOUS

## 2012-07-16 MED ORDER — HYDROMORPHONE HCL PF 1 MG/ML IJ SOLN
1.0000 mg | INTRAMUSCULAR | Status: DC | PRN
  Administered 2012-07-16 – 2012-07-17 (×7): 1 mg via INTRAVENOUS
  Filled 2012-07-16 (×7): qty 1

## 2012-07-16 MED ORDER — TECHNETIUM TC 99M MEBROFENIN IV KIT
5.0000 | PACK | Freq: Once | INTRAVENOUS | Status: AC | PRN
  Administered 2012-07-16: 5 via INTRAVENOUS

## 2012-07-16 MED ORDER — POTASSIUM CHLORIDE 10 MEQ/100ML IV SOLN
10.0000 meq | Freq: Once | INTRAVENOUS | Status: AC
  Administered 2012-07-16: 10 meq via INTRAVENOUS
  Filled 2012-07-16: qty 100

## 2012-07-16 MED ORDER — LACTATED RINGERS IV SOLN: INTRAVENOUS | Status: DC

## 2012-07-16 NOTE — ED Notes (Addendum)
Pt presents very sweaty and in a lot of pain. Pt's son reports the pt awoke from his sleep about 0330 am in a lot of pain. Pt's son reports the pt has gall bladder surgery on July 11. Pt reports pain on his lower right side and into his lower right side of his back.

## 2012-07-16 NOTE — ED Notes (Signed)
PT. REPORTS SUDDEN ONSET UPPER ABDOMINAL /RIGHT LATERAL ABDOMINAL PAIN THIS MORNING , S/P LAPAROSCOPIC CHOLECYSTECTOMY LAST 7/ 11/2012 BY DR. Derrell Lolling .

## 2012-07-16 NOTE — ED Notes (Signed)
Went into do vital signs but patient is still out of room

## 2012-07-16 NOTE — ED Notes (Signed)
RN called to nuclear med for pt complaining of abdominal pain while receiving test. Pt medicated. Family given update.

## 2012-07-16 NOTE — Progress Notes (Signed)
Admitted to room 6N19 due to bile leak, VSS, alert and oriented,on oxygen at 2LPM.Will continue to monitor.

## 2012-07-16 NOTE — Transfer of Care (Signed)
Immediate Anesthesia Transfer of Care Note  Patient: Ethan Silva  Procedure(s) Performed: Procedure(s): ENDOSCOPIC RETROGRADE CHOLANGIOPANCREATOGRAPHY (ERCP) (N/A)  Patient Location: PACU  Anesthesia Type:General  Level of Consciousness: awake, alert  and patient cooperative  Airway & Oxygen Therapy: Patient Spontanous Breathing and Patient connected to nasal cannula oxygen  Post-op Assessment: Report given to PACU RN, Post -op Vital signs reviewed and stable and Patient moving all extremities  Post vital signs: Reviewed and stable  Complications: No apparent anesthesia complications

## 2012-07-16 NOTE — Anesthesia Preprocedure Evaluation (Addendum)
Anesthesia Evaluation  Patient identified by MRN, date of birth, ID band Patient awake    Reviewed: Allergy & Precautions, H&P , NPO status , Patient's Chart, lab work & pertinent test results, reviewed documented beta blocker date and time   Airway Mallampati: II TM Distance: >3 FB Neck ROM: full    Dental  (+) Teeth Intact and Dental Advisory Given   Pulmonary neg pulmonary ROS,  breath sounds clear to auscultation        Cardiovascular hypertension, On Medications and Pt. on medications negative cardio ROS  Rhythm:regular     Neuro/Psych  Headaches, negative psych ROS   GI/Hepatic Neg liver ROS, GERD-  Medicated and Controlled,  Endo/Other  negative endocrine ROS  Renal/GU negative Renal ROS  negative genitourinary   Musculoskeletal   Abdominal   Peds  Hematology negative hematology ROS (+)   Anesthesia Other Findings See surgeon's H&P   Reproductive/Obstetrics negative OB ROS                          Anesthesia Physical Anesthesia Plan  ASA: II  Anesthesia Plan: General   Post-op Pain Management:    Induction: Intravenous  Airway Management Planned: Oral ETT  Additional Equipment:   Intra-op Plan:   Post-operative Plan: Extubation in OR  Informed Consent: I have reviewed the patients History and Physical, chart, labs and discussed the procedure including the risks, benefits and alternatives for the proposed anesthesia with the patient or authorized representative who has indicated his/her understanding and acceptance.   Dental Advisory Given  Plan Discussed with: CRNA and Surgeon  Anesthesia Plan Comments:         Anesthesia Quick Evaluation

## 2012-07-16 NOTE — Progress Notes (Signed)
ERCP demonstrated leak from the cystic duct. A #10 French 5 cm plastic biliary stent was placed.

## 2012-07-16 NOTE — Consult Note (Signed)
[facility] Consultation  Referring Provider:  Dr. Derrell Lolling - [facility] Primary Care Physician:  No PCP Per Patient Primary Gastroenterologist:   none      Reason for Consultation:     Bile duct leak         HPI:   Ethan Silva is a 47 y.o. male who underwent lap chole with IOC on 07/11/12. He presented to ED with upper abdominal pain. HIDA c/w bile duct leak. Patient speaks limited [place], son serving as interpreter. Pain began this am. It is constant, located in upper abdomen with radiation to RUQ. No nausea. No chills. He hasn't eaten yet today  Past Medical History  Diagnosis Date  . Hyperlipidemia   . Hypertension   . GERD (gastroesophageal reflux disease)   . Headache(784.0)     Hx: of when BP is elevated  . Numbness and tingling in hands     Hx: of    Past Surgical History  Procedure Laterality Date  . No past surgeries    . Cholecystectomy N/A 07/11/2012    Procedure: LAPAROSCOPIC CHOLECYSTECTOMY WITH INTRAOPERATIVE CHOLANGIOGRAM;  Surgeon: Axel Filler, MD;  Location: [facility] OR;  Service: General;  Laterality: N/A;    Family History  Problem Relation Age of Onset  . Gallstones Mother    Negative for colon cancer  History  Substance Use Topics  . Smoking status: Never Smoker   . Smokeless tobacco: Never Used  . Alcohol Use: No    Prior to Admission medications   Medication Sig Start Date End Date Taking? Authorizing Provider  lisinopril (PRINIVIL,ZESTRIL) 40 MG tablet Take 1 tablet (40 mg total) by mouth daily. 06/16/12  Yes Elvina Sidle, MD  oxyCODONE-acetaminophen (PERCOCET) 10-325 MG per tablet Take 1 tablet by mouth every 4 (four) hours as needed for pain. 07/11/12  Yes Axel Filler, MD    Current Facility-Administered Medications  Medication Dose Route Frequency Provider Last Rate Last Dose  . 0.9 %  sodium chloride infusion   Intravenous Continuous Sunnie Nielsen, MD      . dextrose 5 %-0.9 % sodium chloride infusion   Intravenous Continuous  Axel Filler, MD      . HYDROcodone-acetaminophen (NORCO/VICODIN) 5-325 MG per tablet 1-2 tablet  1-2 tablet Oral Q4H PRN Axel Filler, MD      . HYDROmorphone (DILAUDID) injection 1 mg  1 mg Intravenous Q4H PRN Axel Filler, MD      . lisinopril (PRINIVIL,ZESTRIL) tablet 40 mg  40 mg Oral Daily Axel Filler, MD      . ondansetron [facility]) injection 4 mg  4 mg Intravenous Q6H PRN Axel Filler, MD      . pantoprazole (PROTONIX) injection 40 mg  40 mg Intravenous QHS Axel Filler, MD      . piperacillin-tazobactam (ZOSYN) IVPB 3.375 g  3.375 g Intravenous Q8H Axel Filler, MD       Current Outpatient Prescriptions  Medication Sig Dispense Refill  . lisinopril (PRINIVIL,ZESTRIL) 40 MG tablet Take 1 tablet (40 mg total) by mouth daily.  90 tablet  3  . oxyCODONE-acetaminophen (PERCOCET) 10-325 MG per tablet Take 1 tablet by mouth every 4 (four) hours as needed for pain.  30 tablet  0    Allergies as of 07/16/2012  . (No Known Allergies)     Review of Systems:    All systems reviewed and negative except where noted in HPI.     Physical Exam:  Vital signs in last 24 hours: Temp:  [98.3 F (36.8 C)] 98.3  F (36.8 C) (07/16 1050) Pulse Rate:  [65-78] 78 (07/16 1050) Resp:  [20-30] 26 (07/16 1050) BP: (143-184)/(91-110) 163/100 mmHg (07/16 1050) SpO2:  [89 %-98 %] 97 % (07/16 1054)   General:   Hispanic male in NAD Head:  Normocephalic and atraumatic. Eyes:   No icterus.   Conjunctiva pink. Ears:  Normal auditory acuity. Neck:  Supple; no masses felt Lungs:  Respirations even and unlabored.In too much pain to sit up or roll over for auscultation.  Heart:  Regular rate and rhythm;  murmur heard. Abdomen:  Soft, mild-moderately distended with hypoactive bowel sounds. Tender in LUQ.   Msk:  Symmetrical without gross deformities.  Extremities:  Without edema. Neurologic:  Alert ;  grossly normal neurologically. Skin:  Intact without significant lesions or  rashes. Cervical Nodes:  No significant cervical adenopathy. Psych:  Alert and cooperative. Normal affect.  LAB RESULTS:  Recent Labs  07/16/12 0501 07/16/12 0509  WBC 11.7*  --   HGB 18.1* 18.0*  HCT 48.9 53.0*  PLT 257  --    BMET  Recent Labs  07/16/12 0501 07/16/12 0509  NA 141 142  K 3.1* 3.0*  CL 99 102  CO2 25  --   GLUCOSE 128* 131*  BUN 19 20  CREATININE 1.07 1.20  CALCIUM 9.7  --    LFT  Recent Labs  07/16/12 0501  PROT 7.6  ALBUMIN 4.2  AST 36  ALT 95*  ALKPHOS 69  BILITOT 0.3   STUDIES: Nm Hepatobiliary Liver Func  07/16/2012   *RADIOLOGY REPORT*  Clinical Data:  Pain.  Biliary leak.  Cholecystectomy.  NUCLEAR MEDICINE HEPATOBILIARY IMAGING  Technique:  Sequential images of the abdomen were obtained out to 60 minutes following intravenous administration of radiopharmaceutical.  Radiopharmaceutical:  Tc-61m Choletec  Comparison:  None.  Findings:   There is normal radiotracer uptake in the liver. Common bile duct activity is identified at 10 minutes.  There is pooling of radiotracer in the region of the porta hepatis and no duodenal excretion of contrast.  Radiotracer outlines the liver margin, compatible with biliary leak.  Radiotracer outlines both pericolic gutters. Was taken in the in the decubitus position which showed free intraperitoneal spread and redistribution of radiotracer away from the porta hepatis.  IMPRESSION: Intraperitoneal spread of radiotracer consistent with biliary leak following cholecystectomy.  These results will be called to the ordering clinician or representative by the Radiologist Assistant, and communication documented in the PACS Dashboard.   Original Report Authenticated By: Andreas Newport, M.D.   Ct Abdomen Pelvis W Contrast  07/16/2012   *RADIOLOGY REPORT*  Clinical Data: 4 days postop laparoscopic cholecystectomy with sudden onset of epigastric pain related to the right upper quadrant.  CT ABDOMEN AND PELVIS WITH  CONTRAST  Technique:  Multidetector CT imaging of the abdomen and pelvis was performed following the standard protocol during bolus administration of intravenous contrast.  Contrast: 80mL OMNIPAQUE IOHEXOL 300 MG/ML  SOLN  Comparison: 06/16/2012 ultrasound.  No comparison CT.  Findings: Bibasilar subsegmental atelectasis.  Postoperative changes anterior wall from recent cholecystectomy. Surgical clips in the region of the gallbladder bed and cystic duct remnant.  Fluid surrounds the liver and distal common bile duct with small amount of fluid in the pelvis.  In the present clinical setting of postoperative right upper quadrant pain this raises possibility of postoperative bile leak.  No free intraperitoneal air.  9 mm appendicolith.  The appendix distal to this region does not appear inflamed.  Bilateral  renal lesions too small to characterize.  Fatty infiltration of the liver without focal worrisome hepatic lesion.  No splenic, pancreatic or adrenal lesion.  Prominent amount of contrast in the stomach and distal esophagus may be related to the recent ingestion of contrast/slow transit time or reflux.  No abdominal aortic aneurysm.  No adenopathy.  Degenerative changes lumbar spine and lower thoracic spine.  No bony destructive lesion.  Noncontrast filled views of the urinary bladder unremarkable. Partial calcification of the prostate gland.  Clinical and laboratory correlation recommended.  Small hydroceles bilaterally greater on the right.  Minimal amount of fluid right inguinal canal.  IMPRESSION: Postoperative changes anterior wall from recent cholecystectomy. Surgical clips in the region of the gallbladder bed and cystic duct remnant.  Fluid surrounds the liver and distal common bile duct with small amount of fluid in the pelvis.  In the present clinical setting of postoperative right upper quadrant pain this raises possibility of postoperative bile leak.  No free intraperitoneal air.  9 mm appendicolith.  The  appendix distal to this region does not appear inflamed.  Bilateral renal lesions too small to characterize.  Fatty infiltration of the liver.  Prominent amount of contrast in the stomach and distal esophagus may be related to the recent ingestion of contrast/slow transit time or reflux.  Partial calcification of the prostate gland.  Clinical and laboratory correlation recommended.  Small hydroceles bilaterally greater on the right.  Minimal amount of fluid right inguinal canal.  Critical Value/emergent results were called by telephone at the time of interpretation on 07/16/2012 at 7:35 a.m. to Dr. Dierdre Highman, who verbally acknowledged these results.   Original Report Authenticated By: Lacy Duverney, M.D.   PREVIOUS ENDOSCOPIES:            none   Impression / Plan:   47 year old Hispanic male with bile duct leak post cholecystectomy 07/11/12. Will plan for ERCP with stent placement today. He has a lot of contrast in stomach and distal esophagus on CTscan. Will give IV Erythromycin now to clear stomach. Risk/ benefits of ERCP explained to patient and son and patient agrees to consent.   Thanks   LOS: 0 days   Willette Cluster  07/16/2012, 11:20 AM  Chart was reviewed and patient was examined. X-rays and lab were reviewed.    I agree with management and plans. Patient has a biliary leak causing bile peritonitis. Plan ERCP and stent placement today  Barbette Hair. Arlyce Dice, M.D., [facility] Cell [phone]   Barbette Hair. Arlyce Dice, MD, [facility] [phone]

## 2012-07-16 NOTE — Anesthesia Postprocedure Evaluation (Signed)
Anesthesia Post Note  Patient: Ethan Silva  Procedure(s) Performed: Procedure(s) (LRB): ENDOSCOPIC RETROGRADE CHOLANGIOPANCREATOGRAPHY (ERCP) (N/A)  Anesthesia type: General  Patient location: PACU  Post pain: Pain level controlled  Post assessment: Patient's Cardiovascular Status Stable  Last Vitals:  Filed Vitals:   07/16/12 1630  BP:   Pulse: 96  Temp:   Resp: 24    Post vital signs: Reviewed and stable  Level of consciousness: alert  Complications: No apparent anesthesia complications

## 2012-07-16 NOTE — Preoperative (Signed)
Beta Blockers   Reason not to administer Beta Blockers:Not Applicable 

## 2012-07-16 NOTE — ED Provider Notes (Signed)
History    CSN: [id number] Arrival date & time 07/16/12  0445  First MD Initiated Contact with Patient 07/16/12 0451     Chief Complaint  Patient presents with  . Abdominal Pain   (Consider location/radiation/quality/duration/timing/severity/associated sxs/prior Treatment) HPI History provided by patient through Spanish speaking interpreter. Right sided abdominal pain, severe after urinating around 4:30 this morning. He has gallbladder removed 07/11/2012. He has been having some postoperative pain, but not this severe. No fevers or chills. Some nausea but no vomiting or diarrhea. No trauma. No back pain. No hematuria. Pain constant since onset. Last bowel movement yesterday reportedly normal.  Past Medical History  Diagnosis Date  . Hyperlipidemia   . Hypertension   . GERD (gastroesophageal reflux disease)   . Headache(784.0)     Hx: of when BP is elevated  . Numbness and tingling in hands     Hx: of   Past Surgical History  Procedure Laterality Date  . No past surgeries    . Cholecystectomy N/A 07/11/2012    Procedure: LAPAROSCOPIC CHOLECYSTECTOMY WITH INTRAOPERATIVE CHOLANGIOGRAM;  Surgeon: Axel Filler, MD;  Location: [facility] OR;  Service: General;  Laterality: N/A;   Family History  Problem Relation Age of Onset  . Gallstones Mother    History  Substance Use Topics  . Smoking status: Never Smoker   . Smokeless tobacco: Never Used  . Alcohol Use: No    Review of Systems  Constitutional: Negative for fever and chills.  HENT: Negative for neck pain.   Respiratory: Negative for shortness of breath.   Cardiovascular: Negative for chest pain.  Gastrointestinal: Positive for abdominal pain.  Genitourinary: Negative for dysuria.  Musculoskeletal: Negative for back pain.  Skin: Negative for rash.  Neurological: Negative for headaches.  All other systems reviewed and are negative.    Allergies  Review of patient's allergies indicates no known allergies.  Home  Medications   Current Outpatient Rx  Name  Route  Sig  Dispense  Refill  . lisinopril (PRINIVIL,ZESTRIL) 40 MG tablet   Oral   Take 1 tablet (40 mg total) by mouth daily.   90 tablet   3   . oxyCODONE-acetaminophen (PERCOCET) 10-325 MG per tablet   Oral   Take 1 tablet by mouth every 4 (four) hours as needed for pain.   30 tablet   0    BP 143/91  Pulse 71  Temp(Src) 98.3 F (36.8 C) (Oral)  Resp 20  SpO2 98% Physical Exam  Constitutional: He is oriented to person, place, and time. He appears well-developed and well-nourished.  HENT:  Head: Normocephalic and atraumatic.  Eyes: EOM are normal. Pupils are equal, round, and reactive to light.  Neck: Neck supple.  Cardiovascular: Normal rate, regular rhythm and intact distal pulses.   Pulmonary/Chest: Effort normal and breath sounds normal. No respiratory distress.  Abdominal:  Distended with diffuse tenderness more so over right upper quadrant and right flank. Laparoscopic surgery scars with Steri-Strips in place  Musculoskeletal: Normal range of motion. He exhibits no edema.  Neurological: He is alert and oriented to person, place, and time.  Skin: Skin is warm and dry.    ED Course  Procedures (including critical care time)  Results for orders placed during the hospital encounter of 07/16/12  CBC      Result Value Range   WBC 11.7 (*) 4.0 - 10.5 K/uL   RBC 5.73  4.22 - 5.81 MIL/uL   Hemoglobin 18.1 (*) 13.0 - 17.0 g/dL  HCT 48.9  39.0 - 52.0 %   MCV 85.3  78.0 - 100.0 fL   MCH 31.6  26.0 - 34.0 pg   MCHC 37.0 (*) 30.0 - 36.0 g/dL   RDW 29.5  62.1 - 30.8 %   Platelets 257  150 - 400 K/uL  COMPREHENSIVE METABOLIC PANEL      Result Value Range   Sodium 141  135 - 145 mEq/L   Potassium 3.1 (*) 3.5 - 5.1 mEq/L   Chloride 99  96 - 112 mEq/L   CO2 25  19 - 32 mEq/L   Glucose, Bld 128 (*) 70 - 99 mg/dL   BUN 19  6 - 23 mg/dL   Creatinine, Ser 6.57  0.50 - 1.35 mg/dL   Calcium 9.7  8.4 - 84.6 mg/dL   Total  Protein 7.6  6.0 - 8.3 g/dL   Albumin 4.2  3.5 - 5.2 g/dL   AST 36  0 - 37 U/L   ALT 95 (*) 0 - 53 U/L   Alkaline Phosphatase 69  39 - 117 U/L   Total Bilirubin 0.3  0.3 - 1.2 mg/dL   GFR calc non Af Amer 80 (*) >90 mL/min   GFR calc Af Amer >90  >90 mL/min  LIPASE, BLOOD      Result Value Range   Lipase 37  11 - 59 U/L  POCT I-STAT, CHEM 8      Result Value Range   Sodium 142  135 - 145 mEq/L   Potassium 3.0 (*) 3.5 - 5.1 mEq/L   Chloride 102  96 - 112 mEq/L   BUN 20  6 - 23 mg/dL   Creatinine, Ser 9.62  0.50 - 1.35 mg/dL   Glucose, Bld 952 (*) 70 - 99 mg/dL   Calcium, Ion 8.41  1.12 - 1.23 mmol/L   TCO2 25  0 - 100 mmol/L   Hemoglobin 18.0 (*) 13.0 - 17.0 g/dL   HCT 32.4 (*) 40.1 - 02.7 %   Dg Chest 2 View  07/10/2012   *RADIOLOGY REPORT*  Clinical Data: Preoperative cholecystectomy.  Hypertension  CHEST - 2 VIEW  Comparison: None.  Findings: Lungs clear.  Heart size and pulmonary vascularity are normal.  No adenopathy.  No bone lesions.  IMPRESSION: No abnormality noted.   Original Report Authenticated By: Bretta Bang, M.D.   Dg Cholangiogram Operative  07/11/2012   *RADIOLOGY REPORT*  Clinical Data: Gallstones.  Laparoscopic cholecystectomy.  INTRAOPERATIVE CHOLANGIOGRAM  Technique:  Multiple fluoroscopic spot radiographs were obtained during intraoperative cholangiogram and are submitted for interpretation post-operatively.  Comparison: Abdominal ultrasound 06/16/2012.  Findings: The residual cystic duct is cannulated.  The cystic duct is opacified.  The common bile duct and common hepatic duct are opacified.  Both right and left to hepatic ducts are visualized. No focal filling defects are evident.  Contrast is followed into the duodenum.  IMPRESSION:  1.  Status post cholecystectomy without evidence for residual intraductal stone or significant dilation.   Original Report Authenticated By: Marin Roberts, M.D.   US Abdomen Complete  06/16/2012   *RADIOLOGY REPORT*   Clinical Data:  Abdominal pain  COMPLETE ABDOMINAL ULTRASOUND  Comparison:  None.  Findings:  Gallbladder:  There are multiple mobile gallstones within the lumen of the gallbladder measuring up to the 2.2 cm.  Gallbladder wall is mildly thickened at 2 mm.   Negative sonographic Murphy's sign.  Common bile duct:  The common bile duct mildly dilated at 7.4 mm  Liver:  No focal lesion identified.  Within normal limits in parenchymal echogenicity.  IVC:  Appears normal.  Pancreas:  No focal abnormality seen.  Spleen:  Normal size and echogenicity.  Right Kidney:  10.3cm in length.  No evidence of hydronephrosis or stones.  Left Kidney:  12.0.cm in length.  No evidence of hydronephrosis or stones.  Abdominal aorta:  01/05  IMPR 1. Multiple gallstones without evidence of acute cholecystitis.  2.  Mild dilatation of the common bile duct.   Original Report Authenticated By: Genevive Bi, M.D.   Ct Abdomen Pelvis W Contrast  07/16/2012   *RADIOLOGY REPORT*  Clinical Data: 4 days postop laparoscopic cholecystectomy with sudden onset of epigastric pain related to the right upper quadrant.  CT ABDOMEN AND PELVIS WITH CONTRAST  Technique:  Multidetector CT imaging of the abdomen and pelvis was performed following the standard protocol during bolus administration of intravenous contrast.  Contrast: 80mL OMNIPAQUE IOHEXOL 300 MG/ML  SOLN  Comparison: 06/16/2012 ultrasound.  No comparison CT.  Findings: Bibasilar subsegmental atelectasis.  Postoperative changes anterior wall from recent cholecystectomy. Surgical clips in the region of the gallbladder bed and cystic duct remnant.  Fluid surrounds the liver and distal common bile duct with small amount of fluid in the pelvis.  In the present clinical setting of postoperative right upper quadrant pain this raises possibility of postoperative bile leak.  No free intraperitoneal air.  9 mm appendicolith.  The appendix distal to this region does not appear inflamed.  Bilateral  renal lesions too small to characterize.  Fatty infiltration of the liver without focal worrisome hepatic lesion.  No splenic, pancreatic or adrenal lesion.  Prominent amount of contrast in the stomach and distal esophagus may be related to the recent ingestion of contrast/slow transit time or reflux.  No abdominal aortic aneurysm.  No adenopathy.  Degenerative changes lumbar spine and lower thoracic spine.  No bony destructive lesion.  Noncontrast filled views of the urinary bladder unremarkable. Partial calcification of the prostate gland.  Clinical and laboratory correlation recommended.  Small hydroceles bilaterally greater on the right.  Minimal amount of fluid right inguinal canal.  IMPRESSION: Postoperative changes anterior wall from recent cholecystectomy. Surgical clips in the region of the gallbladder bed and cystic duct remnant.  Fluid surrounds the liver and distal common bile duct with small amount of fluid in the pelvis.  In the present clinical setting of postoperative right upper quadrant pain this raises possibility of postoperative bile leak.  No free intraperitoneal air.  9 mm appendicolith.  The appendix distal to this region does not appear inflamed.  Bilateral renal lesions too small to characterize.  Fatty infiltration of the liver.  Prominent amount of contrast in the stomach and distal esophagus may be related to the recent ingestion of contrast/slow transit time or reflux.  Partial calcification of the prostate gland.  Clinical and laboratory correlation recommended.  Small hydroceles bilaterally greater on the right.  Minimal amount of fluid right inguinal canal.  Critical Value/emergent results were called by telephone at the time of interpretation on 07/16/2012 at 7:35 a.m. to Dr. Dierdre Highman, who verbally acknowledged these results.   Original Report Authenticated By: Lacy Duverney, M.D.   IV Dilaudid. IV Zofran. IV fluids. N.p.o. IV potassium for hypokalemia  7:55 AM d/w Tresa Endo from [facility] -  will evaluate for admit.   Date: 07/16/2012  Rate: 66  Rhythm: normal sinus rhythm  QRS Axis: normal  Intervals: normal  ST/T Wave abnormalities: nonspecific ST changes  Conduction  Disutrbances:none  Narrative Interpretation:   Old EKG Reviewed: none available   MDM  Severe right-sided abdominal pain today, 4 days status post cholecystectomy.  Treated with IV fluids and IV narcotics - patient remains a significant amount of pain  Evaluated with CT scan as above - concern for bile leak  General surgery consult for admission   Sunnie Nielsen, MD 07/16/12 [id number]

## 2012-07-16 NOTE — Op Note (Signed)
[name] [address] [place], [address]   ERCP PROCEDURE REPORT  PATIENT: Ethan Silva, Ethan Silva  MR# :[id number] BIRTHDATE: 10/21/1965  GENDER: Male ENDOSCOPIST: Louis Meckel, MD REFERRED BY: PROCEDURE DATE:  07/16/2012 PROCEDURE:   ERCP with stent placement ASA CLASS:   Class II INDICATIONS:established bile leak. MEDICATIONS: MAC sedation, administered by CRNA TOPICAL ANESTHETIC:  DESCRIPTION OF PROCEDURE:   After the risks benefits and alternatives of the procedure were thoroughly explained, informed consent was obtained.  The Pentax Ercp Scope [id number]  endoscope was introduced through the mouth  and advanced to the third portion of the duodenum .  The common bile duct was selectively cannulated with a 0.35 mm guidewire.  Injection demonstrated a moderate leak from the most proximal portion of the cystic duct.  No stones were seen.  A 12mm balloon stone extractor was swept from the common hepatic duct through the ampulla and into the duodenum.  No stones were delivered.  A #10 French 5 cm plastic biliary stent was placed into the common bile duct.. The scope was then completely withdrawn from the patient and the procedure terminated.     COMPLICATIONS:  ENDOSCOPIC IMPRESSION: bile duct leak-status post placement of biliary stent  RECOMMENDATIONS: Repeat ERCP with stent removal in 6-8 weeks    _______________________________ eSigned:  Louis Meckel, MD 07/16/2012 3:31 PM   CC: Axel Filler, MD

## 2012-07-16 NOTE — Anesthesia Procedure Notes (Signed)
Procedure Name: Intubation Date/Time: 07/16/2012 2:47 PM Performed by: Darcey Nora B Pre-anesthesia Checklist: Patient identified, Patient being monitored, Emergency Drugs available and Suction available Patient Re-evaluated:Patient Re-evaluated prior to inductionOxygen Delivery Method: Circle system utilized Preoxygenation: Pre-oxygenation with 100% oxygen Intubation Type: IV induction Laryngoscope Size: Mac and 3 (Glidescope) Grade View: Grade I Tube type: Oral Tube size: 7.5 mm Number of attempts: 1 Airway Equipment and Method: Stylet and Video-laryngoscopy Placement Confirmation: ETT inserted through vocal cords under direct vision,  breath sounds checked- equal and bilateral and positive ETCO2 Secured at: 21 (cm at teeth) cm Tube secured with: Tape Dental Injury: Teeth and Oropharynx as per pre-operative assessment

## 2012-07-16 NOTE — ED Notes (Signed)
MD notified pt continues to be in pain.  Medication ordered.  MD to see the patient.

## 2012-07-16 NOTE — H&P (Signed)
Ethan Silva is an 47 y.o. male.   Chief Complaint: abdominal pain HPI: the patient is a 47 year old male who is postop day #4 from a laparoscopic cholecystectomy with IOC. Patient states that over the admission he woke up at 3 AM with epigastric abdominal pain. He is a prior visit no pain. He said no fevers, chills, emesis, or nausea.  Upon evaluation in the ER he underwent a HIDA scan which revealed a biliary leak.  Past Medical History  Diagnosis Date  . Hyperlipidemia   . Hypertension   . GERD (gastroesophageal reflux disease)   . Headache(784.0)     Hx: of when BP is elevated  . Numbness and tingling in hands     Hx: of    Past Surgical History  Procedure Laterality Date  . No past surgeries    . Cholecystectomy N/A 07/11/2012    Procedure: LAPAROSCOPIC CHOLECYSTECTOMY WITH INTRAOPERATIVE CHOLANGIOGRAM;  Surgeon: Axel Filler, MD;  Location: [facility] OR;  Service: General;  Laterality: N/A;    Family History  Problem Relation Age of Onset  . Gallstones Mother    Social History:  reports that he has never smoked. He has never used smokeless tobacco. He reports that he does not drink alcohol or use illicit drugs.  Allergies: No Known Allergies   (Not in a hospital admission)  Results for orders placed during the hospital encounter of 07/16/12 (from the past 48 hour(s))  CBC     Status: Abnormal   Collection Time    07/16/12  5:01 AM      Result Value Range   WBC 11.7 (*) 4.0 - 10.5 K/uL   RBC 5.73  4.22 - 5.81 MIL/uL   Hemoglobin 18.1 (*) 13.0 - 17.0 g/dL   HCT 16.1  09.6 - 04.5 %   MCV 85.3  78.0 - 100.0 fL   MCH 31.6  26.0 - 34.0 pg   MCHC 37.0 (*) 30.0 - 36.0 g/dL   RDW 40.9  81.1 - 91.4 %   Platelets 257  150 - 400 K/uL  COMPREHENSIVE METABOLIC PANEL     Status: Abnormal   Collection Time    07/16/12  5:01 AM      Result Value Range   Sodium 141  135 - 145 mEq/L   Potassium 3.1 (*) 3.5 - 5.1 mEq/L   Chloride 99  96 - 112 mEq/L   CO2 25  19 - 32 mEq/L    Glucose, Bld 128 (*) 70 - 99 mg/dL   BUN 19  6 - 23 mg/dL   Creatinine, Ser 7.82  0.50 - 1.35 mg/dL   Calcium 9.7  8.4 - 95.6 mg/dL   Total Protein 7.6  6.0 - 8.3 g/dL   Albumin 4.2  3.5 - 5.2 g/dL   AST 36  0 - 37 U/L   ALT 95 (*) 0 - 53 U/L   Alkaline Phosphatase 69  39 - 117 U/L   Total Bilirubin 0.3  0.3 - 1.2 mg/dL   GFR calc non Af Amer 80 (*) >90 mL/min   GFR calc Af Amer >90  >90 mL/min   Comment:            The eGFR has been calculated     using the CKD EPI equation.     This calculation has not been     validated in all clinical     situations.     eGFR's persistently     <90 mL/min signify  possible Chronic Kidney Disease.  LIPASE, BLOOD     Status: None   Collection Time    07/16/12  5:01 AM      Result Value Range   Lipase 37  11 - 59 U/L  POCT I-STAT, CHEM 8     Status: Abnormal   Collection Time    07/16/12  5:09 AM      Result Value Range   Sodium 142  135 - 145 mEq/L   Potassium 3.0 (*) 3.5 - 5.1 mEq/L   Chloride 102  96 - 112 mEq/L   BUN 20  6 - 23 mg/dL   Creatinine, Ser 1.61  0.50 - 1.35 mg/dL   Glucose, Bld 096 (*) 70 - 99 mg/dL   Calcium, Ion 0.45  1.12 - 1.23 mmol/L   TCO2 25  0 - 100 mmol/L   Hemoglobin 18.0 (*) 13.0 - 17.0 g/dL   HCT 40.9 (*) 81.1 - 91.4 %  URINALYSIS, ROUTINE W REFLEX MICROSCOPIC     Status: Abnormal   Collection Time    07/16/12 10:14 AM      Result Value Range   Color, Urine YELLOW  YELLOW   APPearance CLEAR  CLEAR   Specific Gravity, Urine 1.029  1.005 - 1.030   pH 6.5  5.0 - 8.0   Glucose, UA NEGATIVE  NEGATIVE mg/dL   Hgb urine dipstick TRACE (*) NEGATIVE   Bilirubin Urine NEGATIVE  NEGATIVE   Ketones, ur NEGATIVE  NEGATIVE mg/dL   Protein, ur NEGATIVE  NEGATIVE mg/dL   Urobilinogen, UA 0.2  0.0 - 1.0 mg/dL   Nitrite NEGATIVE  NEGATIVE   Leukocytes, UA NEGATIVE  NEGATIVE  URINE MICROSCOPIC-ADD ON     Status: Abnormal   Collection Time    07/16/12 10:14 AM      Result Value Range   WBC, UA 0-2  <3 WBC/hpf    RBC / HPF 0-2  <3 RBC/hpf   Casts HYALINE CASTS (*) NEGATIVE   Nm Hepatobiliary Liver Func  07/16/2012   *RADIOLOGY REPORT*  Clinical Data:  Pain.  Biliary leak.  Cholecystectomy.  NUCLEAR MEDICINE HEPATOBILIARY IMAGING  Technique:  Sequential images of the abdomen were obtained out to 60 minutes following intravenous administration of radiopharmaceutical.  Radiopharmaceutical:  Tc-23m Choletec  Comparison:  None.  Findings:   There is normal radiotracer uptake in the liver. Common bile duct activity is identified at 10 minutes.  There is pooling of radiotracer in the region of the porta hepatis and no duodenal excretion of contrast.  Radiotracer outlines the liver margin, compatible with biliary leak.  Radiotracer outlines both pericolic gutters. Was taken in the in the decubitus position which showed free intraperitoneal spread and redistribution of radiotracer away from the porta hepatis.  IMPRESSION: Intraperitoneal spread of radiotracer consistent with biliary leak following cholecystectomy.  These results will be called to the ordering clinician or representative by the Radiologist Assistant, and communication documented in the PACS Dashboard.   Original Report Authenticated By: Andreas Newport, M.D.   Ct Abdomen Pelvis W Contrast  07/16/2012   *RADIOLOGY REPORT*  Clinical Data: 4 days postop laparoscopic cholecystectomy with sudden onset of epigastric pain related to the right upper quadrant.  CT ABDOMEN AND PELVIS WITH CONTRAST  Technique:  Multidetector CT imaging of the abdomen and pelvis was performed following the standard protocol during bolus administration of intravenous contrast.  Contrast: 80mL OMNIPAQUE IOHEXOL 300 MG/ML  SOLN  Comparison: 06/16/2012 ultrasound.  No comparison CT.  Findings:  Bibasilar subsegmental atelectasis.  Postoperative changes anterior wall from recent cholecystectomy. Surgical clips in the region of the gallbladder bed and cystic duct remnant.  Fluid surrounds  the liver and distal common bile duct with small amount of fluid in the pelvis.  In the present clinical setting of postoperative right upper quadrant pain this raises possibility of postoperative bile leak.  No free intraperitoneal air.  9 mm appendicolith.  The appendix distal to this region does not appear inflamed.  Bilateral renal lesions too small to characterize.  Fatty infiltration of the liver without focal worrisome hepatic lesion.  No splenic, pancreatic or adrenal lesion.  Prominent amount of contrast in the stomach and distal esophagus may be related to the recent ingestion of contrast/slow transit time or reflux.  No abdominal aortic aneurysm.  No adenopathy.  Degenerative changes lumbar spine and lower thoracic spine.  No bony destructive lesion.  Noncontrast filled views of the urinary bladder unremarkable. Partial calcification of the prostate gland.  Clinical and laboratory correlation recommended.  Small hydroceles bilaterally greater on the right.  Minimal amount of fluid right inguinal canal.  IMPRESSION: Postoperative changes anterior wall from recent cholecystectomy. Surgical clips in the region of the gallbladder bed and cystic duct remnant.  Fluid surrounds the liver and distal common bile duct with small amount of fluid in the pelvis.  In the present clinical setting of postoperative right upper quadrant pain this raises possibility of postoperative bile leak.  No free intraperitoneal air.  9 mm appendicolith.  The appendix distal to this region does not appear inflamed.  Bilateral renal lesions too small to characterize.  Fatty infiltration of the liver.  Prominent amount of contrast in the stomach and distal esophagus may be related to the recent ingestion of contrast/slow transit time or reflux.  Partial calcification of the prostate gland.  Clinical and laboratory correlation recommended.  Small hydroceles bilaterally greater on the right.  Minimal amount of fluid right inguinal canal.   Critical Value/emergent results were called by telephone at the time of interpretation on 07/16/2012 at 7:35 a.m. to Dr. Dierdre Highman, who verbally acknowledged these results.   Original Report Authenticated By: Lacy Duverney, M.D.    Review of Systems  Constitutional: Negative for fever and chills.  HENT: Negative.   Respiratory: Negative.   Cardiovascular: Negative.   Gastrointestinal: Positive for abdominal pain. Negative for heartburn, nausea and diarrhea.  Musculoskeletal: Negative.   Skin: Negative.   Neurological: Negative.   All other systems reviewed and are negative.    Blood pressure 163/100, pulse 78, temperature 98.3 F (36.8 C), temperature source Oral, resp. rate 26, SpO2 97.00%. Physical Exam  Vitals reviewed. Constitutional: He is oriented to person, place, and time. He appears well-developed and well-nourished.  HENT:  Head: Normocephalic and atraumatic.  Eyes: Conjunctivae and EOM are normal. Pupils are equal, round, and reactive to light.  Neck: Normal range of motion. Neck supple.  Cardiovascular: Normal rate, regular rhythm and normal heart sounds.   Respiratory: Effort normal and breath sounds normal.  GI: Soft. Bowel sounds are normal. He exhibits no distension. There is tenderness (x4Q). There is guarding. There is no rebound.  Musculoskeletal: Normal range of motion.  Neurological: He is alert and oriented to person, place, and time.  Skin: Skin is warm and dry.     Assessment/Plan 46 year old male with a biliary leak status post laparoscopic cholecystectomy.  1. With the patient in the hospital kept n.p.o. And started on pain medication. 2. Will consult GI  for evaluation for ERCP and possible stent placement. 3. This was discussed with the patient and all questions were answered.  [name]., Halley Kincer 07/16/2012, 10:57 AM

## 2012-07-17 ENCOUNTER — Encounter (HOSPITAL_COMMUNITY): Payer: Self-pay | Admitting: Gastroenterology

## 2012-07-17 LAB — COMPREHENSIVE METABOLIC PANEL
ALT: 76 U/L — ABNORMAL HIGH (ref 0–53)
AST: 68 U/L — ABNORMAL HIGH (ref 0–37)
Albumin: 3.3 g/dL — ABNORMAL LOW (ref 3.5–5.2)
Alkaline Phosphatase: 61 U/L (ref 39–117)
BUN: 13 mg/dL (ref 6–23)
CO2: 27 mEq/L (ref 19–32)
Calcium: 9 mg/dL (ref 8.4–10.5)
Chloride: 104 mEq/L (ref 96–112)
Creatinine, Ser: 0.82 mg/dL (ref 0.50–1.35)
GFR calc Af Amer: >90 mL/min (ref >90)
GFR calc non Af Amer: >90 mL/min (ref >90)
Glucose, Bld: 135 mg/dL — ABNORMAL HIGH (ref 70–99)
Potassium: 4.2 mEq/L (ref 3.5–5.1)
Sodium: 138 mEq/L (ref 135–145)
Total Bilirubin: 0.5 mg/dL (ref 0.3–1.2)
Total Protein: 6.8 g/dL (ref 6.0–8.3)

## 2012-07-17 LAB — CBC
HCT: 43.8 % (ref 39.0–52.0)
Hemoglobin: 15.8 g/dL (ref 13.0–17.0)
MCH: 31.2 pg (ref 26.0–34.0)
MCHC: 36.1 g/dL — ABNORMAL HIGH (ref 30.0–36.0)
MCV: 86.6 fL (ref 78.0–100.0)
Platelets: 201 10*3/uL (ref 150–400)
RBC: 5.06 MIL/uL (ref 4.22–5.81)
RDW: 13.6 % (ref 11.5–15.5)
WBC: 15.4 10*3/uL — ABNORMAL HIGH (ref 4.0–10.5)

## 2012-07-17 NOTE — Progress Notes (Signed)
INITIAL NUTRITION ASSESSMENT  DOCUMENTATION CODES Per approved criteria  -Not Applicable   INTERVENTION:  Advance diet as medically appropriate RD to follow for nutrition care plan, add interventions accordingly  NUTRITION DIAGNOSIS: Inadequate oral intake related to inability to eat as evidenced by NPO status  Goal: Pt to meet >/= 90% of their estimated nutrition needs  Monitor:  PO diet advancement & intake, weight, labs, I/O's  Reason for Assessment: Malnutrition Screening Tool Report  47 y.o. male  Admitting Dx: abdominal pain  ASSESSMENT: Patient s//p laparoscopic cholecystectomy 7/11; presented to ED with sudden upper abdominal pain with some nausea and vomiting.  Patient s/p procedure 7/17: ENDOSCOPIC RETROGRADE CHOLANGIOPANCREATOGRAPHY (ERCP)  Patient's family report patient had a poor appetite for 3 day PTA; was eating chicken soup with a piece of bread; remains NPO at this time; family also reports an approximate 10 lb weight loss, however, records show weight stability ---> RD to monitor nutrition care plan progression.  Height: Ht Readings from Last 1 Encounters:  07/17/12 5\' 3"  (1.6 m)    Weight: Wt Readings from Last 1 Encounters:  07/17/12 165 lb 8 oz (75.07 kg)    Ideal Body Weight: 124 lb  % Ideal Body Weight: 133%  Wt Readings from Last 10 Encounters:  07/17/12 165 lb 8 oz (75.07 kg)  07/17/12 165 lb 8 oz (75.07 kg)  07/17/12 165 lb 8 oz (75.07 kg)  07/11/12 165 lb (74.844 kg)  07/11/12 165 lb (74.844 kg)  07/10/12 165 lb 4.8 oz (74.98 kg)  06/26/12 163 lb (73.936 kg)  06/16/12 163 lb (73.936 kg)    Usual Body Weight: 163 lb  % Usual Body Weight: 101%  BMI:  Body mass index is 29.32 kg/(m^2).  Estimated Nutritional Needs: Kcal: 1800-2000 Protein: 90-100 gm Fluid: 1.8-2.0 L  Skin: abdominal incision   Diet Order: NPO  EDUCATION NEEDS: -No education needs identified at this time   Intake/Output Summary (Last 24 hours) at  07/17/12 1555 Last data filed at 07/17/12 1352  Gross per 24 hour  Intake 683.33 ml  Output   1250 ml  Net -566.67 ml    Labs:   Recent Labs Lab 07/10/12 1609 07/16/12 0501 07/16/12 0509 07/17/12 0525  NA 141 141 142 138  K 3.6 3.1* 3.0* 4.2  CL 102 99 102 104  CO2 27 25  --  27  BUN 17 19 20 13   CREATININE 1.03 1.07 1.20 0.82  CALCIUM 9.8 9.7  --  9.0  GLUCOSE 104* 128* 131* 135*    Scheduled Meds: . lisinopril  40 mg Oral Daily  . pantoprazole (PROTONIX) IV  40 mg Intravenous QHS  . piperacillin-tazobactam (ZOSYN)  IV  3.375 g Intravenous Q8H    Continuous Infusions: . dextrose 5 % and 0.9 % NaCl with KCl 40 mEq/L 100 mL/hr at 07/17/12 1329  . lactated ringers 50 mL/hr at 07/16/12 1335    Past Medical History  Diagnosis Date  . Hyperlipidemia   . Hypertension   . GERD (gastroesophageal reflux disease)   . Headache(784.0)     Hx: of when BP is elevated  . Numbness and tingling in hands     Hx: of    Past Surgical History  Procedure Laterality Date  . Cholecystectomy N/A 07/11/2012    Procedure: LAPAROSCOPIC CHOLECYSTECTOMY WITH INTRAOPERATIVE CHOLANGIOGRAM;  Surgeon: Axel Filler, MD;  Location: [facility] OR;  Service: General;  Laterality: N/A;  . Ercp  07/16/2012  . Ercp N/A 07/16/2012    Procedure:  ENDOSCOPIC RETROGRADE CHOLANGIOPANCREATOGRAPHY (ERCP);  Surgeon: Louis Meckel, MD;  Location: [facility] OR;  Service: Gastroenterology;  Laterality: N/A;    Maureen Chatters, RD, LDN Pager #: [phone] After-Hours Pager #: [phone]

## 2012-07-17 NOTE — Progress Notes (Signed)
[facility] Progress Note   Subjective  *Less abdominal pain**   Objective   Vital signs in last 24 hours: Temp:  [98.2 F (36.8 C)-99.3 F (37.4 C)] 99.3 F (37.4 C) (07/17 1046) Pulse Rate:  [89-105] 89 (07/17 1046) Resp:  [16-24] 20 (07/17 1046) BP: (144-189)/(81-96) 163/90 mmHg (07/17 1046) SpO2:  [92 %-98 %] 95 % (07/17 1046) Weight:  [165 lb 8 oz (75.07 kg)] 165 lb 8 oz (75.07 kg) (07/17 1046)   General:    hispanic male in NAD Heart:  Regular rate and rhythm; no murmurs Lungs: Respirations even and unlabored, lungs CTA bilaterally Abdomen:  Soft, nontender and nondistended. Normal bowel sounds. Extremities:  Without edema. Neurologic:  Alert and oriented,  grossly normal neurologically. Psych:  Cooperative. Normal mood and affect.  Lab Results:  Recent Labs  07/16/12 0501 07/16/12 0509 07/17/12 0525  WBC 11.7*  --  15.4*  HGB 18.1* 18.0* 15.8  HCT 48.9 53.0* 43.8  PLT 257  --  201   BMET  Recent Labs  07/16/12 0501 07/16/12 0509 07/17/12 0525  NA 141 142 138  K 3.1* 3.0* 4.2  CL 99 102 104  CO2 25  --  27  GLUCOSE 128* 131* 135*  BUN 19 20 13   CREATININE 1.07 1.20 0.82  CALCIUM 9.7  --  9.0   LFT  Recent Labs  07/17/12 0525  PROT 6.8  ALBUMIN 3.3*  AST 68*  ALT 76*  ALKPHOS 61  BILITOT 0.5    Studies/Results:  Dg Ercp  07/16/2012   *RADIOLOGY REPORT*  Clinical Data: Bile leak after cholecystectomy.  ERCP  Comparison:  CT and nuclear medicine studies earlier today.  Technique:  Multiple spot images obtained with the fluoroscopic device and submitted for interpretation post-procedure.  ERCP was performed by Dr. Arlyce Dice.  Findings: Images during ERCP show cannulation of the common bile duct.  Contrast injection demonstrates extravasation at the level of the cystic duct stump.  A plastic biliary stent was placed spanning from above the cystic duct to the duodenum.  IMPRESSION: ERCP positive for bile leak at the level of the cystic  duct stump. A plastic biliary stent was placed in the common bile duct.  These images were submitted for radiologic interpretation only. Please see the procedural report for the amount of contrast and the fluoroscopy time utilized.   Original Report Authenticated By: Irish Lack, M.D.     Assessment / Plan:    Bile duct leak post cholecystectomy. Patient had ERCP with placement of biliary stent yesterday. Feels better. WBC up from 11.7 to 15.4 today. On Zosyn.    LOS: 1 day   Willette Cluster  07/17/2012, 10:56 AM  I have personally taken an interval history, reviewed the chart, and examined the patient. Will need repeat ERCP in 6-8 weeks.  Barbette Hair. Arlyce Dice, MD, [facility] [phone]

## 2012-07-17 NOTE — Progress Notes (Signed)
1 Day Post-Op  Subjective: ERCP noted yesterday. Pt feels less pain today.  Wants to walk. No nausea   Objective: Vital signs in last 24 hours: Temp:  [98.2 F (36.8 C)-99.1 F (37.3 C)] 98.9 F (37.2 C) (07/17 0522) Pulse Rate:  [78-105] 99 (07/17 0522) Resp:  [16-26] 18 (07/17 0522) BP: (144-189)/(81-100) 158/91 mmHg (07/17 0522) SpO2:  [89 %-98 %] 96 % (07/17 0522)    Intake/Output from previous day: 07/16 0701 - 07/17 0700 In: 1683.3 [I.V.:1683.3] Out: 1700 [Urine:1700] Intake/Output this shift:    General appearance: alert and cooperative GI: approp ttp, nd, active BS  Lab Results:   Recent Labs  07/16/12 0501 07/16/12 0509 07/17/12 0525  WBC 11.7*  --  15.4*  HGB 18.1* 18.0* 15.8  HCT 48.9 53.0* 43.8  PLT 257  --  201   BMET  Recent Labs  07/16/12 0501 07/16/12 0509 07/17/12 0525  NA 141 142 138  K 3.1* 3.0* 4.2  CL 99 102 104  CO2 25  --  27  GLUCOSE 128* 131* 135*  BUN 19 20 13   CREATININE 1.07 1.20 0.82  CALCIUM 9.7  --  9.0   PT/INR No results found for this basename: LABPROT, INR,  in the last 72 hours ABG No results found for this basename: PHART, PCO2, PO2, HCO3,  in the last 72 hours  Studies/Results: Nm Hepatobiliary Liver Func  07/16/2012   *RADIOLOGY REPORT*  Clinical Data:  Pain.  Biliary leak.  Cholecystectomy.  NUCLEAR MEDICINE HEPATOBILIARY IMAGING  Technique:  Sequential images of the abdomen were obtained out to 60 minutes following intravenous administration of radiopharmaceutical.  Radiopharmaceutical:  Tc-1m Choletec  Comparison:  None.  Findings:   There is normal radiotracer uptake in the liver. Common bile duct activity is identified at 10 minutes.  There is pooling of radiotracer in the region of the porta hepatis and no duodenal excretion of contrast.  Radiotracer outlines the liver margin, compatible with biliary leak.  Radiotracer outlines both pericolic gutters. Was taken in the in the decubitus position which  showed free intraperitoneal spread and redistribution of radiotracer away from the porta hepatis.  IMPRESSION: Intraperitoneal spread of radiotracer consistent with biliary leak following cholecystectomy.  These results will be called to the ordering clinician or representative by the Radiologist Assistant, and communication documented in the PACS Dashboard.   Original Report Authenticated By: Andreas Newport, M.D.   Ct Abdomen Pelvis W Contrast  07/16/2012   *RADIOLOGY REPORT*  Clinical Data: 4 days postop laparoscopic cholecystectomy with sudden onset of epigastric pain related to the right upper quadrant.  CT ABDOMEN AND PELVIS WITH CONTRAST  Technique:  Multidetector CT imaging of the abdomen and pelvis was performed following the standard protocol during bolus administration of intravenous contrast.  Contrast: 80mL OMNIPAQUE IOHEXOL 300 MG/ML  SOLN  Comparison: 06/16/2012 ultrasound.  No comparison CT.  Findings: Bibasilar subsegmental atelectasis.  Postoperative changes anterior wall from recent cholecystectomy. Surgical clips in the region of the gallbladder bed and cystic duct remnant.  Fluid surrounds the liver and distal common bile duct with small amount of fluid in the pelvis.  In the present clinical setting of postoperative right upper quadrant pain this raises possibility of postoperative bile leak.  No free intraperitoneal air.  9 mm appendicolith.  The appendix distal to this region does not appear inflamed.  Bilateral renal lesions too small to characterize.  Fatty infiltration of the liver without focal worrisome hepatic lesion.  No splenic, pancreatic  or adrenal lesion.  Prominent amount of contrast in the stomach and distal esophagus may be related to the recent ingestion of contrast/slow transit time or reflux.  No abdominal aortic aneurysm.  No adenopathy.  Degenerative changes lumbar spine and lower thoracic spine.  No bony destructive lesion.  Noncontrast filled views of the urinary  bladder unremarkable. Partial calcification of the prostate gland.  Clinical and laboratory correlation recommended.  Small hydroceles bilaterally greater on the right.  Minimal amount of fluid right inguinal canal.  IMPRESSION: Postoperative changes anterior wall from recent cholecystectomy. Surgical clips in the region of the gallbladder bed and cystic duct remnant.  Fluid surrounds the liver and distal common bile duct with small amount of fluid in the pelvis.  In the present clinical setting of postoperative right upper quadrant pain this raises possibility of postoperative bile leak.  No free intraperitoneal air.  9 mm appendicolith.  The appendix distal to this region does not appear inflamed.  Bilateral renal lesions too small to characterize.  Fatty infiltration of the liver.  Prominent amount of contrast in the stomach and distal esophagus may be related to the recent ingestion of contrast/slow transit time or reflux.  Partial calcification of the prostate gland.  Clinical and laboratory correlation recommended.  Small hydroceles bilaterally greater on the right.  Minimal amount of fluid right inguinal canal.  Critical Value/emergent results were called by telephone at the time of interpretation on 07/16/2012 at 7:35 a.m. to Dr. Dierdre Highman, who verbally acknowledged these results.   Original Report Authenticated By: Lacy Duverney, M.D.   Dg Ercp  07/16/2012   *RADIOLOGY REPORT*  Clinical Data: Bile leak after cholecystectomy.  ERCP  Comparison:  CT and nuclear medicine studies earlier today.  Technique:  Multiple spot images obtained with the fluoroscopic device and submitted for interpretation post-procedure.  ERCP was performed by Dr. Arlyce Dice.  Findings: Images during ERCP show cannulation of the common bile duct.  Contrast injection demonstrates extravasation at the level of the cystic duct stump.  A plastic biliary stent was placed spanning from above the cystic duct to the duodenum.  IMPRESSION: ERCP  positive for bile leak at the level of the cystic duct stump. A plastic biliary stent was placed in the common bile duct.  These images were submitted for radiologic interpretation only. Please see the procedural report for the amount of contrast and the fluoroscopy time utilized.   Original Report Authenticated By: Irish Lack, M.D.    Anti-infectives: Anti-infectives   Start     Dose/Rate Route Frequency Ordered Stop   07/16/12 1400  piperacillin-tazobactam (ZOSYN) IVPB 3.375 g     3.375 g 12.5 mL/hr over 240 Minutes Intravenous 3 times per day 07/16/12 1103     07/16/12 1245  erythromycin 250 mg in sodium chloride 0.9 % 100 mL IVPB  Status:  Discontinued     250 mg 100 mL/hr over 60 Minutes Intravenous  Once 07/16/12 1206 07/16/12 1805      Assessment/Plan: s/p Procedure(s): ENDOSCOPIC RETROGRADE CHOLANGIOPANCREATOGRAPHY (ERCP) (N/A) Advance diet Pain control Appreciate Dr. Marzetta Board help with the ERCP Hopefully home in 1-2d when pain controlled.  LOS: 1 day    [name]., [facility] 07/17/2012

## 2012-07-18 MED ORDER — ACETAMINOPHEN 325 MG PO TABS: 650.0000 mg | ORAL_TABLET | Freq: Four times a day (QID) | ORAL | Status: DC | PRN

## 2012-07-18 MED ORDER — PANTOPRAZOLE SODIUM 40 MG PO TBEC
40.0000 mg | DELAYED_RELEASE_TABLET | Freq: Every day | ORAL | Status: DC
  Administered 2012-07-18: 40 mg via ORAL
  Filled 2012-07-18: qty 1

## 2012-07-18 MED ORDER — HYDROCODONE-ACETAMINOPHEN 5-325 MG PO TABS: 1.0000 | ORAL_TABLET | ORAL | Status: DC | PRN

## 2012-07-18 NOTE — Progress Notes (Signed)
I agree with with PA Jenning's note.  Adv diet Hopefully home today after he eats and mobilizes

## 2012-07-18 NOTE — Progress Notes (Signed)
[facility] Progress Note   Subjective  no abdominal pain.   Objective   Vital signs in last 24 hours: Temp:  [98.5 F (36.9 C)-99.1 F (37.3 C)] 99.1 F (37.3 C) (07/18 0530) Pulse Rate:  [72-81] 72 (07/18 0530) Resp:  [17-20] 18 (07/18 0530) BP: (142-164)/(89-96) 142/89 mmHg (07/18 0530) SpO2:  [94 %-96 %] 95 % (07/18 0530)   General:    hispanic male in NAD Lungs: Respirations even and unlabored, lungs CTA bilaterally Abdomen:  Soft, nontender and nondistended. Normal bowel sounds. Extremities:  Without edema. Neurologic:  Alert and oriented,  grossly normal neurologically. Psych:  Cooperative. Normal mood and affect.  Lab Results:  Recent Labs  07/16/12 0501 07/16/12 0509 07/17/12 0525  WBC 11.7*  --  15.4*  HGB 18.1* 18.0* 15.8  HCT 48.9 53.0* 43.8  PLT 257  --  201   LFT  Recent Labs  07/17/12 0525  PROT 6.8  ALBUMIN 3.3*  AST 68*  ALT 76*  ALKPHOS 61  BILITOT 0.5    Studies/Results: Dg Ercp  07/16/2012   *RADIOLOGY REPORT*  Clinical Data: Bile leak after cholecystectomy.  ERCP  Comparison:  CT and nuclear medicine studies earlier today.  Technique:  Multiple spot images obtained with the fluoroscopic device and submitted for interpretation post-procedure.  ERCP was performed by Dr. Arlyce Dice.  Findings: Images during ERCP show cannulation of the common bile duct.  Contrast injection demonstrates extravasation at the level of the cystic duct stump.  A plastic biliary stent was placed spanning from above the cystic duct to the duodenum.  IMPRESSION: ERCP positive for bile leak at the level of the cystic duct stump. A plastic biliary stent was placed in the common bile duct.  These images were submitted for radiologic interpretation only. Please see the procedural report for the amount of contrast and the fluoroscopy time utilized.   Original Report Authenticated By: Irish Lack, M.D.     Assessment / Plan:   Bile duct leak post cholecystectomy.  Patient had ERCP with placement of biliary stent two days ago. Our office will arrange for EGD with stent removal to be done in 6 weeks.     LOS: 2 days   Willette Cluster  07/18/2012, 11:09 AM  I have personally taken an interval history, reviewed the chart, and examined the patient.  I agree with the extender's note, impression and recommendations. Signing off.  Barbette Hair. Arlyce Dice, MD, [facility] [phone]

## 2012-07-18 NOTE — Progress Notes (Addendum)
2 Days Post-Op  Subjective: Feeling better just had some full liquids, pain better with pain med.    Objective: Vital signs in last 24 hours: Temp:  [98.5 F (36.9 C)-99.3 F (37.4 C)] 99.1 F (37.3 C) (07/18 0530) Pulse Rate:  [72-89] 72 (07/18 0530) Resp:  [17-20] 18 (07/18 0530) BP: (142-164)/(89-96) 142/89 mmHg (07/18 0530) SpO2:  [94 %-96 %] 95 % (07/18 0530) Weight:  [75.07 kg (165 lb 8 oz)] 75.07 kg (165 lb 8 oz) (07/17 1046)  Diet: full liquids Afebrile, VSS, BP is up some No labs today Intake/Output from previous day: 07/17 0701 - 07/18 0700 In: 3497 [I.V.:3347; IV Piggyback:150] Out: 250 [Urine:250] Intake/Output this shift:    General appearance: alert, cooperative and no distress GI: soft, still some tenderness, incisions look fine.  No distension.,+BS  Lab Results:   Recent Labs  07/16/12 0501 07/16/12 0509 07/17/12 0525  WBC 11.7*  --  15.4*  HGB 18.1* 18.0* 15.8  HCT 48.9 53.0* 43.8  PLT 257  --  201    BMET  Recent Labs  07/16/12 0501 07/16/12 0509 07/17/12 0525  NA 141 142 138  K 3.1* 3.0* 4.2  CL 99 102 104  CO2 25  --  27  GLUCOSE 128* 131* 135*  BUN 19 20 13   CREATININE 1.07 1.20 0.82  CALCIUM 9.7  --  9.0   PT/INR No results found for this basename: LABPROT, INR,  in the last 72 hours   Recent Labs Lab 07/16/12 0501 07/17/12 0525  AST 36 68*  ALT 95* 76*  ALKPHOS 69 61  BILITOT 0.3 0.5  PROT 7.6 6.8  ALBUMIN 4.2 3.3*     Lipase     Component Value Date/Time   LIPASE 37 07/16/2012 0501     Studies/Results: Nm Hepatobiliary Liver Func  07/16/2012   *RADIOLOGY REPORT*  Clinical Data:  Pain.  Biliary leak.  Cholecystectomy.  NUCLEAR MEDICINE HEPATOBILIARY IMAGING  Technique:  Sequential images of the abdomen were obtained out to 60 minutes following intravenous administration of radiopharmaceutical.  Radiopharmaceutical:  Tc-108m Choletec  Comparison:  None.  Findings:   There is normal radiotracer uptake in the  liver. Common bile duct activity is identified at 10 minutes.  There is pooling of radiotracer in the region of the porta hepatis and no duodenal excretion of contrast.  Radiotracer outlines the liver margin, compatible with biliary leak.  Radiotracer outlines both pericolic gutters. Was taken in the in the decubitus position which showed free intraperitoneal spread and redistribution of radiotracer away from the porta hepatis.  IMPRESSION: Intraperitoneal spread of radiotracer consistent with biliary leak following cholecystectomy.  These results will be called to the ordering clinician or representative by the Radiologist Assistant, and communication documented in the PACS Dashboard.   Original Report Authenticated By: Andreas Newport, M.D.   Dg Ercp  07/16/2012   *RADIOLOGY REPORT*  Clinical Data: Bile leak after cholecystectomy.  ERCP  Comparison:  CT and nuclear medicine studies earlier today.  Technique:  Multiple spot images obtained with the fluoroscopic device and submitted for interpretation post-procedure.  ERCP was performed by Dr. Arlyce Dice.  Findings: Images during ERCP show cannulation of the common bile duct.  Contrast injection demonstrates extravasation at the level of the cystic duct stump.  A plastic biliary stent was placed spanning from above the cystic duct to the duodenum.  IMPRESSION: ERCP positive for bile leak at the level of the cystic duct stump. A plastic biliary stent was placed  in the common bile duct.  These images were submitted for radiologic interpretation only. Please see the procedural report for the amount of contrast and the fluoroscopy time utilized.   Original Report Authenticated By: Irish Lack, M.D.    Medications: . lisinopril  40 mg Oral Daily  . pantoprazole  40 mg Oral Daily  . piperacillin-tazobactam (ZOSYN)  IV  3.375 g Intravenous Q8H    Assessment/Plan biliary leak status post laparoscopic cholecystectomy 07/11/2012,Ethan Derrell Lolling, MD ERCP with stent  placement,07/16/2012,  Ethan Meckel, MD Hypertension  hyperlipidemia     Plan:  Mobilize, get him off O2, advance diet at lunch.     1600 hours:  He is feeling better, ask about pain he says he only has 10 pills left at home.  I have given him some additional pain medicine for use at home.  He is scheduled to see Dr. Antonieta Pert next week in follow up.  LOS: 2 days    Ethan Silva 07/18/2012

## 2012-07-23 ENCOUNTER — Ambulatory Visit (INDEPENDENT_AMBULATORY_CARE_PROVIDER_SITE_OTHER): Payer: BC Managed Care – PPO | Admitting: General Surgery

## 2012-07-23 ENCOUNTER — Encounter (INDEPENDENT_AMBULATORY_CARE_PROVIDER_SITE_OTHER): Payer: Self-pay | Admitting: General Surgery

## 2012-07-23 VITALS — BP 124/80 | HR 68 | Temp 97.9°F | Resp 14 | Ht 63.0 in | Wt 156.0 lb

## 2012-07-23 DIAGNOSIS — Z9889 Other specified postprocedural states: Secondary | ICD-10-CM

## 2012-07-23 DIAGNOSIS — Z9049 Acquired absence of other specified parts of digestive tract: Secondary | ICD-10-CM

## 2012-07-23 NOTE — Progress Notes (Signed)
Patient ID: Ethan Silva, male   DOB: 1965-07-20, 47 y.o.   MRN: [id number] The patient is a 47 year old male status post laparoscopic cholecystectomy. Subsequently this patient had a bile leak which time he underwent a CT and biliary stent placement by Dr. Arlyce Dice. The patient is doing well since that time that her abdominal pain at this time. He's eating Well at this time not have any issues with his bowel.  Pathology:revealed no tumor, chronic cholecystitis  On exam: His wounds are clean dry and intact Minimal abdominal pain   Assessment and plan: 1.  We will have this followed back 3 weeks. 2. Patient to return to work 2 weeks with no heavy lifting.

## 2012-07-25 ENCOUNTER — Other Ambulatory Visit: Payer: Self-pay | Admitting: *Deleted

## 2012-07-25 DIAGNOSIS — K839 Disease of biliary tract, unspecified: Secondary | ICD-10-CM

## 2012-08-04 NOTE — Discharge Summary (Signed)
Physician Discharge Summary  Patient ID: Ethan Silva MRN: [id number] DOB/AGE: 1965/09/18 47 y.o.  Admit date: 07/16/2012 Discharge date: 08/04/2012  Admission Diagnoses: Bile Leak  Discharge Diagnoses: s/p bile leak and ERCP with CBD stent placement Active Problems:   Common bile duct leak   Discharged Condition: good  Hospital Course: Pt is a 47 y/o M who recently underwent a Lap chole with IOC.  He present and was admitted with a bile leak.  Pt had an ERCP on day of admission with Stent placement of CBD by Dr. Arlyce Dice.  His abd pain improved over the 2 days her was in the hospital.  He was tol regular PO.  He was otherwise ambulating on his own and had good pain control.  Consults: GI - Dr. Arlyce Dice  Significant Diagnostic Studies: endoscopy: ERCP: bile leak at the cystic duct remnant  Treatments: ERCP with stent placment  Discharge Exam: Blood pressure 149/95, pulse 74, temperature 99.2 F (37.3 C), temperature source Oral, resp. rate 20, height 5\' 3"  (1.6 m), weight 165 lb 8 oz (75.07 kg), SpO2 98.00%. General appearance: alert and cooperative Head: Normocephalic, without obvious abnormality, atraumatic Resp: clear to auscultation bilaterally Cardio: regular rate and rhythm, S1, S2 normal, no murmur, click, rub or gallop GI: soft, non-tender; bowel sounds normal; no masses,  no organomegaly  Disposition: 01-Home or Self Care   Future Appointments Provider Department Dept Phone   08/13/2012 4:10 PM Axel Filler, MD [facility], [place] [phone]       Medication List         acetaminophen 325 MG tablet  Commonly known as:  TYLENOL  Take 2 tablets (650 mg total) by mouth every 6 (six) hours as needed for pain.     HYDROcodone-acetaminophen 5-325 MG per tablet  Commonly known as:  NORCO/VICODIN  Take 1-2 tablets by mouth every 4 (four) hours as needed.     lisinopril 40 MG tablet  Commonly known as:  PRINIVIL,ZESTRIL  Take 1 tablet (40 mg total) by  mouth daily.     oxyCODONE-acetaminophen 10-325 MG per tablet  Commonly known as:  PERCOCET  Take 1 tablet by mouth every 4 (four) hours as needed for pain.           Follow-up Information   Follow up with Lajean Saver, MD. Schedule an appointment as soon as possible for a visit in 1 week.   Contact information:   [address] [place] [address] [phone]       Signed: [name]., [name] 08/04/2012, 9:13 AM

## 2012-08-08 ENCOUNTER — Telehealth: Payer: Self-pay | Admitting: *Deleted

## 2012-08-08 NOTE — Telephone Encounter (Signed)
Per ERCP on 07/16/12, Dr Arlyce Dice asked for a repeat ERCP in 8 weeks to remove the biliary stent. Called pt and through [employer], interpreter # [phone]. Pt given the reason for repeat ERCP to remove stent, pprep instructions and time and location. Pt stated his son can read [place] or he will call for further clarification. Map, instructions mailed to pt.

## 2012-08-13 ENCOUNTER — Ambulatory Visit (INDEPENDENT_AMBULATORY_CARE_PROVIDER_SITE_OTHER): Payer: BC Managed Care – PPO | Admitting: General Surgery

## 2012-08-13 ENCOUNTER — Encounter (INDEPENDENT_AMBULATORY_CARE_PROVIDER_SITE_OTHER): Payer: Self-pay | Admitting: General Surgery

## 2012-08-13 VITALS — BP 130/84 | HR 72 | Resp 16 | Ht 63.0 in | Wt 158.4 lb

## 2012-08-13 DIAGNOSIS — Z9889 Other specified postprocedural states: Secondary | ICD-10-CM

## 2012-08-13 DIAGNOSIS — Z9049 Acquired absence of other specified parts of digestive tract: Secondary | ICD-10-CM

## 2012-08-13 NOTE — Progress Notes (Signed)
Patient ID: Ethan Silva, male   DOB: 01-13-1965, 47 y.o.   MRN: [id number] The patient is a 47 year old male status post laparoscopic cholecystectomy, and bile leak. Patient was seen by Dr. Arlyce Dice and biliary stent placed. After this hospitalization biliary stent patient's been doing well. He states he has some periumbilical pain which is normal secondary to the trocar site. Is otherwise eating well having normal bowel function.  On exam: Wounds clean dry and intact  Assessment and plan: 1. The patient follow up as needed 2. Patient follow up with Dr. Arlyce Dice as instructed

## 2012-09-15 ENCOUNTER — Encounter (HOSPITAL_COMMUNITY): Payer: Self-pay | Admitting: *Deleted

## 2012-10-06 ENCOUNTER — Ambulatory Visit (HOSPITAL_COMMUNITY): Payer: BC Managed Care – PPO

## 2012-10-06 ENCOUNTER — Ambulatory Visit (HOSPITAL_COMMUNITY): Payer: BC Managed Care – PPO | Admitting: Anesthesiology

## 2012-10-06 ENCOUNTER — Encounter (HOSPITAL_COMMUNITY): Payer: Self-pay | Admitting: *Deleted

## 2012-10-06 ENCOUNTER — Encounter (HOSPITAL_COMMUNITY)
Admission: RE | Disposition: A | Discharge status: Home / Self Care | Payer: Self-pay | Source: Ambulatory Visit | Attending: Gastroenterology

## 2012-10-06 ENCOUNTER — Encounter (HOSPITAL_COMMUNITY): Payer: Self-pay | Admitting: Anesthesiology

## 2012-10-06 ENCOUNTER — Ambulatory Visit (HOSPITAL_COMMUNITY)
Admission: RE | Admit: 2012-10-06 | Discharge: 2012-10-06 | Disposition: A | Discharge status: Home / Self Care | Payer: BC Managed Care – PPO | Source: Ambulatory Visit | Attending: Gastroenterology | Admitting: Gastroenterology

## 2012-10-06 DIAGNOSIS — E785 Hyperlipidemia, unspecified: Secondary | ICD-10-CM | POA: Insufficient documentation

## 2012-10-06 DIAGNOSIS — K838 Other specified diseases of biliary tract: Secondary | ICD-10-CM

## 2012-10-06 DIAGNOSIS — K8050 Calculus of bile duct without cholangitis or cholecystitis without obstruction: Secondary | ICD-10-CM | POA: Insufficient documentation

## 2012-10-06 DIAGNOSIS — I10 Essential (primary) hypertension: Secondary | ICD-10-CM | POA: Insufficient documentation

## 2012-10-06 DIAGNOSIS — K839 Disease of biliary tract, unspecified: Secondary | ICD-10-CM

## 2012-10-06 HISTORY — PX: ERCP: SHX5425

## 2012-10-06 SURGERY — ERCP, WITH INTERVENTION IF INDICATED
Anesthesia: Monitor Anesthesia Care

## 2012-10-06 MED ORDER — PROMETHAZINE HCL 25 MG/ML IJ SOLN
6.2500 mg | INTRAMUSCULAR | Status: DC | PRN
  Administered 2012-10-06: 12.5 mg via INTRAVENOUS

## 2012-10-06 MED ORDER — SODIUM CHLORIDE 0.9 % IV SOLN: INTRAVENOUS | Status: DC | PRN

## 2012-10-06 MED ORDER — SODIUM CHLORIDE 0.9 % IV SOLN
INTRAVENOUS | Status: DC
Start: 2012-10-06 — End: 2012-10-06

## 2012-10-06 MED ORDER — LIDOCAINE HCL (CARDIAC) 20 MG/ML IV SOLN
INTRAVENOUS | Status: DC | PRN
  Administered 2012-10-06: 50 mg via INTRAVENOUS

## 2012-10-06 MED ORDER — ONDANSETRON HCL 4 MG/2ML IJ SOLN
INTRAMUSCULAR | Status: DC | PRN
  Administered 2012-10-06: 4 mg via INTRAMUSCULAR

## 2012-10-06 MED ORDER — CIPROFLOXACIN IN D5W 400 MG/200ML IV SOLN
400.0000 mg | Freq: Once | INTRAVENOUS | Status: AC
  Administered 2012-10-06: 400 mg via INTRAVENOUS

## 2012-10-06 MED ORDER — EPHEDRINE SULFATE 50 MG/ML IJ SOLN
INTRAMUSCULAR | Status: DC | PRN
  Administered 2012-10-06 (×2): 5 mg via INTRAVENOUS

## 2012-10-06 MED ORDER — LACTATED RINGERS IV SOLN
INTRAVENOUS | Status: DC
  Administered 2012-10-06: 1000 mL via INTRAVENOUS

## 2012-10-06 MED ORDER — PROPOFOL 10 MG/ML IV BOLUS
INTRAVENOUS | Status: DC | PRN
  Administered 2012-10-06: 160 mg via INTRAVENOUS

## 2012-10-06 MED ORDER — SODIUM CHLORIDE 0.9 % IJ SOLN
INTRAMUSCULAR | Status: AC
  Filled 2012-10-06: qty 10

## 2012-10-06 MED ORDER — FENTANYL CITRATE 0.05 MG/ML IJ SOLN
INTRAMUSCULAR | Status: DC | PRN
  Administered 2012-10-06 (×2): 50 ug via INTRAVENOUS

## 2012-10-06 MED ORDER — CIPROFLOXACIN IN D5W 400 MG/200ML IV SOLN
INTRAVENOUS | Status: AC
  Filled 2012-10-06: qty 200

## 2012-10-06 MED ORDER — MIDAZOLAM HCL 5 MG/5ML IJ SOLN
INTRAMUSCULAR | Status: DC | PRN
  Administered 2012-10-06: 2 mg via INTRAVENOUS

## 2012-10-06 MED ORDER — PROMETHAZINE HCL 25 MG/ML IJ SOLN
INTRAMUSCULAR | Status: AC
  Filled 2012-10-06: qty 1

## 2012-10-06 NOTE — Anesthesia Postprocedure Evaluation (Signed)
Anesthesia Post Note  Patient: Ethan Silva  Procedure(s) Performed: Procedure(s) (LRB): ENDOSCOPIC RETROGRADE CHOLANGIOPANCREATOGRAPHY (ERCP) (N/A)  Anesthesia type: General  Patient location: PACU  Post pain: Pain level controlled  Post assessment: Post-op Vital signs reviewed  Last Vitals: BP 134/92  Pulse 68  Temp(Src) 36.4 C (Oral)  Resp 15  Ht 5\' 3"  (1.6 m)  Wt 132 lb 4.4 oz (60 kg)  BMI 23.44 kg/m2  SpO2 94%  Post vital signs: Reviewed  Level of consciousness: sedated  Complications: No apparent anesthesia complications

## 2012-10-06 NOTE — Consult Note (Signed)
  History of Present Illness: 47 year old Hispanic male status post biliary stent insertion for a bile duct leak in July, 2014, here for repeat ERCP and stent removal.  Patient has no GI complaints.    Past Medical History  Diagnosis Date  . Hyperlipidemia   . Hypertension   . Numbness and tingling in hands     Hx: of  . Headache(784.0)     Hx: of when BP is elevated   Past Surgical History  Procedure Laterality Date  . Cholecystectomy N/A 07/11/2012    Procedure: LAPAROSCOPIC CHOLECYSTECTOMY WITH INTRAOPERATIVE CHOLANGIOGRAM;  Surgeon: Axel Filler, MD;  Location: [facility] OR;  Service: General;  Laterality: N/A;  . Ercp  07/16/2012  . Ercp N/A 07/16/2012    Procedure: ENDOSCOPIC RETROGRADE CHOLANGIOPANCREATOGRAPHY (ERCP);  Surgeon: Louis Meckel, MD;  Location: [facility] OR;  Service: Gastroenterology;  Laterality: N/A;   family history includes Gallstones in his mother. Current Facility-Administered Medications  Medication Dose Route Frequency Provider Last Rate Last Dose  . 0.9 %  sodium chloride infusion   Intravenous Continuous Louis Meckel, MD      . ciprofloxacin (CIPRO) IVPB 400 mg  400 mg Intravenous Once Louis Meckel, MD      . lactated ringers infusion   Intravenous Continuous Louis Meckel, MD 20 mL/hr at 10/06/12 0947 1,000 mL at 10/06/12 0947   Allergies as of 07/25/2012  . (No Known Allergies)    reports that he has never smoked. He has never used smokeless tobacco. He reports that he does not drink alcohol or use illicit drugs.     Review of Systems: Pertinent positive and negative review of systems were noted in the above HPI section. All other review of systems were otherwise negative.  Vital signs were reviewed in today's medical record Physical Exam: General: Well developed , well nourished, no acute distress Skin: anicteric Head: Normocephalic and atraumatic Eyes:  sclerae anicteric, EOMI Ears: Normal auditory acuity Mouth: No deformity or  lesions Neck: Supple, no masses or thyromegaly Lungs: Clear throughout to auscultation Heart: Regular rate and rhythm; no murmurs, rubs or bruits Abdomen: Soft, non tender and non distended. No masses, hepatosplenomegaly or hernias noted. Normal Bowel sounds Rectal:deferred Musculoskeletal: Symmetrical with no gross deformities  Skin: No lesions on visible extremities Pulses:  Normal pulses noted Extremities: No clubbing, cyanosis, edema or deformities noted Neurological: Alert oriented x 4, grossly nonfocal Cervical Nodes:  No significant cervical adenopathy Inguinal Nodes: No significant inguinal adenopathy Psychological:  Alert and cooperative. Normal mood and affect  Impression-bile duct leak, status post biliary stent placement  Plan repeat ERCP with stent removal and cholangiogram

## 2012-10-06 NOTE — Transfer of Care (Signed)
Immediate Anesthesia Transfer of Care Note  Patient: Ethan Silva  Procedure(s) Performed: Procedure(s): ENDOSCOPIC RETROGRADE CHOLANGIOPANCREATOGRAPHY (ERCP) (N/A)  Patient Location: PACU  Anesthesia Type:General  Level of Consciousness: awake, alert  and oriented  Airway & Oxygen Therapy: Patient Spontanous Breathing and Patient connected to face mask oxygen  Post-op Assessment: Report given to PACU RN and Post -op Vital signs reviewed and stable  Post vital signs: Reviewed and stable  Complications: No apparent anesthesia complications

## 2012-10-06 NOTE — Anesthesia Preprocedure Evaluation (Addendum)
Anesthesia Evaluation  Patient identified by MRN, date of birth, ID band Patient awake    Reviewed: Allergy & Precautions, H&P , NPO status , Patient's Chart, lab work & pertinent test results, reviewed documented beta blocker date and time   Airway Mallampati: II TM Distance: >3 FB Neck ROM: full    Dental  (+) Teeth Intact and Dental Advisory Given   Pulmonary neg pulmonary ROS,  breath sounds clear to auscultation        Cardiovascular hypertension, On Medications and Pt. on medications negative cardio ROS  Rhythm:regular     Neuro/Psych  Headaches, negative psych ROS   GI/Hepatic Neg liver ROS, GERD-  Medicated and Controlled,  Endo/Other  negative endocrine ROS  Renal/GU negative Renal ROS     Musculoskeletal   Abdominal   Peds  Hematology negative hematology ROS (+)   Anesthesia Other Findings See surgeon's H&P   Reproductive/Obstetrics negative OB ROS                           Anesthesia Physical  Anesthesia Plan  ASA: II  Anesthesia Plan: General and MAC   Post-op Pain Management:    Induction: Intravenous  Airway Management Planned: Oral ETT  Additional Equipment:   Intra-op Plan:   Post-operative Plan: Extubation in OR  Informed Consent: I have reviewed the patients History and Physical, chart, labs and discussed the procedure including the risks, benefits and alternatives for the proposed anesthesia with the patient or authorized representative who has indicated his/her understanding and acceptance.   Dental Advisory Given  Plan Discussed with: CRNA  Anesthesia Plan Comments:        Anesthesia Quick Evaluation

## 2012-10-06 NOTE — Op Note (Signed)
[facility] [address] [place], [address]   ERCP PROCEDURE REPORT  PATIENT: Ethan, Silva  MR# :[id number] BIRTHDATE: 04/18/1965  GENDER: Male ENDOSCOPIST: Louis Meckel, MD REFERRED BY: PROCEDURE DATE:  10/06/2012 PROCEDURE:   ERCP with sphincterotomy/papillotomy and ERCP with removal of calculus/calculi, removal of biliary stent ASA CLASS:   Class II INDICATIONS:therapy of bile leak.  status post biliary stent placement in July, 2014 for cystic duct leak MEDICATIONS: Cipro 400 mg IV and MAC sedation, administered by CRNA  TOPICAL ANESTHETIC:  DESCRIPTION OF PROCEDURE:   After the risks benefits and alternatives of the procedure were thoroughly explained, informed consent was obtained.  The Pentax ERCP [id number]  endoscope was introduced through the mouth  and advanced to the third portion of the duodenum .  1.  The plastic biliary stent was seen protruding from the ampulla. This was grabbed by a polypectomy snare and pulled from the common bile duct  The common bile duct was selectively cannulated and injected.  There was no leakage of contrast dye was seen throughout the biliary system including the cystic duct.  There were at least 2 filling defects measuring 4-5 mm in the distal bile duct consistent with stones. A 12-15 mm sphincterotomy cut was made.  The bile duct was swept with a 15mm balloon stone extractor multiple times.  Stones and debris were removed.  Final cholangiogram was normal. 2.  The plastic biliary stent was seen protruding from the ampulla. This was grabbed by a polypectomy snare and pulled from the common bile duct   The scope was then completely withdrawn from the patient and the procedure terminated.     COMPLICATIONS:  ENDOSCOPIC IMPRESSION: 1.  choledocholithiasis-status post sphincterotomy and stone removal 2.  healed cystic duct leak  RECOMMENDATIONS: Expectant  management    _______________________________ eSignedLouis Meckel, MD 10/06/2012 12:26 PM   CC: Ernestina Columbia, MD

## 2012-10-07 ENCOUNTER — Encounter (HOSPITAL_COMMUNITY): Payer: Self-pay | Admitting: Gastroenterology

## 2012-10-08 ENCOUNTER — Emergency Department (HOSPITAL_COMMUNITY)
Admission: EM | Admit: 2012-10-08 | Discharge: 2012-10-08 | Disposition: A | Discharge status: Home / Self Care | Payer: BC Managed Care – PPO | Attending: Emergency Medicine | Admitting: Emergency Medicine

## 2012-10-08 ENCOUNTER — Encounter: Payer: Self-pay | Admitting: Gastroenterology

## 2012-10-08 DIAGNOSIS — Z8639 Personal history of other endocrine, nutritional and metabolic disease: Secondary | ICD-10-CM | POA: Insufficient documentation

## 2012-10-08 DIAGNOSIS — R110 Nausea: Secondary | ICD-10-CM

## 2012-10-08 DIAGNOSIS — I10 Essential (primary) hypertension: Secondary | ICD-10-CM | POA: Insufficient documentation

## 2012-10-08 DIAGNOSIS — Z79899 Other long term (current) drug therapy: Secondary | ICD-10-CM | POA: Insufficient documentation

## 2012-10-08 DIAGNOSIS — Z862 Personal history of diseases of the blood and blood-forming organs and certain disorders involving the immune mechanism: Secondary | ICD-10-CM | POA: Insufficient documentation

## 2012-10-08 DIAGNOSIS — R109 Unspecified abdominal pain: Secondary | ICD-10-CM

## 2012-10-08 LAB — COMPREHENSIVE METABOLIC PANEL
ALT: 34 U/L (ref 0–53)
AST: 24 U/L (ref 0–37)
Alkaline Phosphatase: 57 U/L (ref 39–117)
BUN: 13 mg/dL (ref 6–23)
CO2: 24 mEq/L (ref 19–32)
Chloride: 99 mEq/L (ref 96–112)
Creatinine, Ser: 0.76 mg/dL (ref 0.50–1.35)
GFR calc Af Amer: >90 mL/min (ref >90)
GFR calc non Af Amer: >90 mL/min (ref >90)
Glucose, Bld: 108 mg/dL — ABNORMAL HIGH (ref 70–99)
Potassium: 3.7 mEq/L (ref 3.5–5.1)
Sodium: 135 mEq/L (ref 135–145)
Total Bilirubin: 0.3 mg/dL (ref 0.3–1.2)
Total Protein: 7.7 g/dL (ref 6.0–8.3)

## 2012-10-08 LAB — CBC WITH DIFFERENTIAL/PLATELET
Basophils Absolute: 0 10*3/uL (ref 0.0–0.1)
Basophils Relative: 0 % (ref 0–1)
Eosinophils Absolute: 0 10*3/uL (ref 0.0–0.7)
HCT: 47 % (ref 39.0–52.0)
Hemoglobin: 16.7 g/dL (ref 13.0–17.0)
Lymphocytes Relative: 17 % (ref 12–46)
Lymphs Abs: 1.4 10*3/uL (ref 0.7–4.0)
MCH: 30.4 pg (ref 26.0–34.0)
MCHC: 35.5 g/dL (ref 30.0–36.0)
MCV: 85.6 fL (ref 78.0–100.0)
Monocytes Absolute: 0.3 10*3/uL (ref 0.1–1.0)
Monocytes Relative: 4 % (ref 3–12)
Neutro Abs: 6.5 10*3/uL (ref 1.7–7.7)
Neutrophils Relative %: 78 % — ABNORMAL HIGH (ref 43–77)
Platelets: 226 10*3/uL (ref 150–400)
RBC: 5.49 MIL/uL (ref 4.22–5.81)
RDW: 13.7 % (ref 11.5–15.5)
WBC: 8.2 10*3/uL (ref 4.0–10.5)

## 2012-10-08 LAB — URINE MICROSCOPIC-ADD ON

## 2012-10-08 LAB — URINALYSIS, ROUTINE W REFLEX MICROSCOPIC
Bilirubin Urine: NEGATIVE
Glucose, UA: NEGATIVE mg/dL
Ketones, ur: NEGATIVE mg/dL
Leukocytes, UA: NEGATIVE
Nitrite: NEGATIVE
Protein, ur: NEGATIVE mg/dL
Specific Gravity, Urine: 1.02 (ref 1.005–1.030)
Urobilinogen, UA: 0.2 mg/dL (ref 0.0–1.0)
pH: 6 (ref 5.0–8.0)

## 2012-10-08 LAB — LIPASE, BLOOD: Lipase: 27 U/L (ref 11–59)

## 2012-10-08 MED ORDER — ONDANSETRON HCL 4 MG/2ML IJ SOLN
4.0000 mg | Freq: Once | INTRAMUSCULAR | Status: AC
  Administered 2012-10-08: 4 mg via INTRAVENOUS
  Filled 2012-10-08: qty 2

## 2012-10-08 MED ORDER — ONDANSETRON 4 MG PO TBDP: 4.0000 mg | ORAL_TABLET | Freq: Three times a day (TID) | ORAL | Status: DC | PRN

## 2012-10-08 MED ORDER — SODIUM CHLORIDE 0.9 % IV BOLUS (SEPSIS)
1000.0000 mL | Freq: Once | INTRAVENOUS | Status: AC
  Administered 2012-10-08: 1000 mL via INTRAVENOUS

## 2012-10-08 MED ORDER — HYDROCODONE-ACETAMINOPHEN 5-325 MG PO TABS: 2.0000 | ORAL_TABLET | ORAL | Status: DC | PRN

## 2012-10-08 MED ORDER — MORPHINE SULFATE 4 MG/ML IJ SOLN
4.0000 mg | Freq: Once | INTRAMUSCULAR | Status: AC
  Administered 2012-10-08: 4 mg via INTRAVENOUS
  Filled 2012-10-08: qty 1

## 2012-10-08 NOTE — ED Provider Notes (Signed)
CSN: [id number]     Arrival date & time 10/08/12  7062 History   First MD Initiated Contact with Patient 10/08/12 [phone]     Chief Complaint  Patient presents with  . Fever   (Consider location/radiation/quality/duration/timing/severity/associated sxs/prior Treatment) HPI Comments: Patient is a 47 year old male with a past medical history of hypertension who presents with abdominal pain since last night. Symptoms started gradually and progressively worsened since the onset. The pain is aching and mild without radiation. Pain is worse with walking. No alleviating factors. Patient reports associated nausea and subjective fevere. Patient had a cholescystectomy 3 months and had an ERCP 2 days ago. Symptoms started after the procedure 2 days ago. No other associated symptoms.   Patient is a 47 y.o. male presenting with fever.  Fever Associated symptoms: nausea     Past Medical History  Diagnosis Date  . Hyperlipidemia   . Hypertension   . Numbness and tingling in hands     Hx: of  . Headache(784.0)     Hx: of when BP is elevated   Past Surgical History  Procedure Laterality Date  . Cholecystectomy N/A 07/11/2012    Procedure: LAPAROSCOPIC CHOLECYSTECTOMY WITH INTRAOPERATIVE CHOLANGIOGRAM;  Surgeon: Axel Filler, MD;  Location: [facility] OR;  Service: General;  Laterality: N/A;  . Ercp  07/16/2012  . Ercp N/A 07/16/2012    Procedure: ENDOSCOPIC RETROGRADE CHOLANGIOPANCREATOGRAPHY (ERCP);  Surgeon: Louis Meckel, MD;  Location: [facility] OR;  Service: Gastroenterology;  Laterality: N/A;  . Ercp N/A 10/06/2012    Procedure: ENDOSCOPIC RETROGRADE CHOLANGIOPANCREATOGRAPHY (ERCP);  Surgeon: Louis Meckel, MD;  Location: [facility];  Service: Endoscopy;  Laterality: N/A;   Family History  Problem Relation Age of Onset  . Gallstones Mother    History  Substance Use Topics  . Smoking status: Never Smoker   . Smokeless tobacco: Never Used  . Alcohol Use: No    Review of Systems  Constitutional:  Positive for fever.  Gastrointestinal: Positive for nausea and abdominal pain.  All other systems reviewed and are negative.    Allergies  Review of patient's allergies indicates no known allergies.  Home Medications   Current Outpatient Rx  Name  Route  Sig  Dispense  Refill  . ibuprofen (ADVIL,MOTRIN) 200 MG tablet   Oral   Take 200 mg by mouth every 6 (six) hours as needed for pain or fever.         Marland Kitchen lisinopril (PRINIVIL,ZESTRIL) 40 MG tablet   Oral   Take 1 tablet (40 mg total) by mouth daily.   90 tablet   3    BP 178/112  Pulse 88  Temp(Src) 98.6 F (37 C) (Oral)  Resp 20  SpO2 96% Physical Exam  Nursing note and vitals reviewed. Constitutional: He is oriented to person, place, and time. He appears well-developed and well-nourished. No distress.  HENT:  Head: Normocephalic and atraumatic.  Eyes: Conjunctivae and EOM are normal. Pupils are equal, round, and reactive to light. No scleral icterus.  Neck: Normal range of motion.  Cardiovascular: Normal rate and regular rhythm.  Exam reveals no gallop and no friction rub.   No murmur heard. Pulmonary/Chest: Effort normal and breath sounds normal. He has no wheezes. He has no rales. He exhibits no tenderness.  Abdominal: Soft. He exhibits no distension. There is no tenderness. There is no rebound and no guarding.  Mild tenderness to palpation on generalized bilateral abdomen. No focal tenderness or peritoneal signs.   Musculoskeletal: Normal range  of motion.  Neurological: He is alert and oriented to person, place, and time. Coordination normal.  Speech is goal-oriented. Moves limbs without ataxia.   Skin: Skin is warm and dry.  Psychiatric: He has a normal mood and affect. His behavior is normal.    ED Course  Procedures (including critical care time) Labs Review Labs Reviewed  CBC WITH DIFFERENTIAL - Abnormal; Notable for the following:    Neutrophils Relative % 78 (*)    All other components within normal  limits  COMPREHENSIVE METABOLIC PANEL - Abnormal; Notable for the following:    Glucose, Bld 108 (*)    All other components within normal limits  URINALYSIS, ROUTINE W REFLEX MICROSCOPIC - Abnormal; Notable for the following:    Hgb urine dipstick TRACE (*)    All other components within normal limits  URINE CULTURE  LIPASE, BLOOD  URINE MICROSCOPIC-ADD ON   Imaging Review Dg Ercp Biliary & Pancreatic Ducts  10/06/2012   CLINICAL DATA:  Common bile duct stones.  EXAM: ERCP performed by Dr. Melvia Heaps.  TECHNIQUE: Multiple spot images obtained with the fluoroscopic device and submitted for interpretation post-procedure.  COMPARISON:  ERCP dated 07/16/2012  FINDINGS: Multiple stones are visible in the common bile duct on the initial image. Sphincterotomy was performed and balloon sweep was performed for stone removal. No stones are visible on the last available image.  IMPRESSION: Sphincterotomy with extraction of multiple common bile duct stones.  These images were submitted for radiologic interpretation only. Please see the procedural report for the amount of contrast and the fluoroscopy time utilized.  FLUOROSCOPY TIME: FLUOROSCOPY TIME  1 Min 42 seconds.   Electronically Signed   By: Geanie Cooley M.D.   On: 10/06/2012 13:35    MDM   1. Abdominal pain   2. Nausea     8:50 AM Labs and urinalysis pending. Vitals stable and patient afebrile. Patient will have fluids, morphine and zofran for symptoms.   12:02 PM Patient feeling better. Labs and urinalysis unremarkable. I spoke with Doug Sou, PA-C about the patient who agrees he can be discharged with an outpatient follow up. I scheduled the patient for an appointment next week. Patient instructed to return with worsening or concerning symptoms. Patient will be discharged with Vicodin and Zofran for symptoms.     Emilia Beck, PA-C 10/08/12 1204

## 2012-10-08 NOTE — ED Notes (Signed)
Pt had surgery to remove gall stones on Monday. Pt states he began to have fever last night and pain around lower abdomen and bilat sides. Pt states the pain is so severe he when he was walking he had to stop walking suddenly

## 2012-10-08 NOTE — Progress Notes (Signed)
   CARE MANAGEMENT ED NOTE 10/08/2012  Patient:  SIRR, KABEL   Account Number:  [id number]  Date Initiated:  10/08/2012  Documentation initiated by:  Edd Arbour  Subjective/Objective Assessment:   bcbs [place] ppo 47 yr old male without pcp listed in EPIC     Subjective/Objective Assessment Detail:     Action/Plan:   Action/Plan Detail:   Anticipated DC Date:  10/08/2012     Status Recommendation to Physician:   Result of Recommendation:    Other ED Services  Consult Working Plan    DC Planning Services  Other  PCP issues  Outpatient Services - Pt will follow up    Choice offered to / List presented to:            Status of service:  Completed, signed off  ED Comments:   ED Comments Detail:  [facility] ED CM noted pt with coverage but no pcp listed Spoke with pt who confirms no pcp [facility] ED CM spoke with pt on how to obtain an in network pcp with insurance coverage via the customer service number or web site Cm reviewed ED level of care for crisis/emergent services and community pcp level of care to manage continuous or chronic medical concerns.  The pt voiced understanding CM encouraged pt and discussed pt's responsibility to verify with pt's insurance carrier that any recommended medical provider offered by any emergency room or a hospital provider is within the carrier's network. The pt voiced understanding

## 2012-10-08 NOTE — ED Notes (Signed)
Pt primary language is spanish.  Pt son at bedside to help interpret.

## 2012-10-08 NOTE — ED Notes (Signed)
Pt states he also has pain in his left arm and lower legs as well.

## 2012-10-08 NOTE — ED Provider Notes (Signed)
Medical screening examination/treatment/procedure(s) were conducted as a shared visit with non-physician practitioner(s) and myself.  I personally evaluated the patient during the encounter  Patient evaluated by me. Patient here 2 days after an ERCP with abdominal pain. The nausea or vomiting. No fever. States there was a fever last night. On exam, vitals are stable. He has a benign abdomen with mild epigastric tenderness. No rebound or guarding. No masses. Last normal here. We contacted GI who stated that he can followup with them. We do not feel he needs a scan, GI agrees. Stable for discharge  Dagmar Hait, MD 10/08/12 1546

## 2012-10-09 LAB — URINE CULTURE
Colony Count: NO GROWTH
Culture: NO GROWTH
Special Requests: NORMAL

## 2012-10-10 ENCOUNTER — Encounter (HOSPITAL_COMMUNITY): Payer: Self-pay | Admitting: Emergency Medicine

## 2012-10-10 ENCOUNTER — Emergency Department (HOSPITAL_COMMUNITY)
Admission: EM | Admit: 2012-10-10 | Discharge: 2012-10-10 | Disposition: A | Discharge status: Home / Self Care | Payer: BC Managed Care – PPO | Attending: Emergency Medicine | Admitting: Emergency Medicine

## 2012-10-10 ENCOUNTER — Emergency Department (HOSPITAL_COMMUNITY): Admission: EM | Admit: 2012-10-10 | Discharge: 2012-10-10 | Discharge status: Against Medical Advice | Payer: Self-pay

## 2012-10-10 DIAGNOSIS — Z8719 Personal history of other diseases of the digestive system: Secondary | ICD-10-CM | POA: Insufficient documentation

## 2012-10-10 DIAGNOSIS — Z8639 Personal history of other endocrine, nutritional and metabolic disease: Secondary | ICD-10-CM | POA: Insufficient documentation

## 2012-10-10 DIAGNOSIS — R109 Unspecified abdominal pain: Secondary | ICD-10-CM

## 2012-10-10 DIAGNOSIS — Z9089 Acquired absence of other organs: Secondary | ICD-10-CM | POA: Insufficient documentation

## 2012-10-10 DIAGNOSIS — Z79899 Other long term (current) drug therapy: Secondary | ICD-10-CM | POA: Insufficient documentation

## 2012-10-10 DIAGNOSIS — R110 Nausea: Secondary | ICD-10-CM | POA: Insufficient documentation

## 2012-10-10 DIAGNOSIS — R1012 Left upper quadrant pain: Secondary | ICD-10-CM | POA: Insufficient documentation

## 2012-10-10 DIAGNOSIS — I10 Essential (primary) hypertension: Secondary | ICD-10-CM | POA: Insufficient documentation

## 2012-10-10 DIAGNOSIS — Z862 Personal history of diseases of the blood and blood-forming organs and certain disorders involving the immune mechanism: Secondary | ICD-10-CM | POA: Insufficient documentation

## 2012-10-10 DIAGNOSIS — R509 Fever, unspecified: Secondary | ICD-10-CM | POA: Insufficient documentation

## 2012-10-10 LAB — CBC WITH DIFFERENTIAL/PLATELET
Basophils Absolute: 0 10*3/uL (ref 0.0–0.1)
Basophils Relative: 0 % (ref 0–1)
Eosinophils Absolute: 0 10*3/uL (ref 0.0–0.7)
Eosinophils Relative: 0 % (ref 0–5)
HCT: 49.8 % (ref 39.0–52.0)
Hemoglobin: 18.2 g/dL — ABNORMAL HIGH (ref 13.0–17.0)
Lymphs Abs: 1.4 10*3/uL (ref 0.7–4.0)
MCH: 30.7 pg (ref 26.0–34.0)
MCHC: 36.5 g/dL — ABNORMAL HIGH (ref 30.0–36.0)
MCV: 84.1 fL (ref 78.0–100.0)
Monocytes Absolute: 0.4 10*3/uL (ref 0.1–1.0)
Monocytes Relative: 4 % (ref 3–12)
Neutro Abs: 7.7 10*3/uL (ref 1.7–7.7)
Neutrophils Relative %: 81 % — ABNORMAL HIGH (ref 43–77)
Platelets: 261 10*3/uL (ref 150–400)
RBC: 5.92 MIL/uL — ABNORMAL HIGH (ref 4.22–5.81)
RDW: 13.4 % (ref 11.5–15.5)
WBC: 9.5 10*3/uL (ref 4.0–10.5)

## 2012-10-10 LAB — COMPREHENSIVE METABOLIC PANEL
ALT: 38 U/L (ref 0–53)
AST: 26 U/L (ref 0–37)
Albumin: 4.5 g/dL (ref 3.5–5.2)
Alkaline Phosphatase: 59 U/L (ref 39–117)
BUN: 15 mg/dL (ref 6–23)
Calcium: 9.6 mg/dL (ref 8.4–10.5)
Chloride: 100 mEq/L (ref 96–112)
Creatinine, Ser: 0.86 mg/dL (ref 0.50–1.35)
GFR calc Af Amer: >90 mL/min (ref >90)
GFR calc non Af Amer: >90 mL/min (ref >90)
Glucose, Bld: 131 mg/dL — ABNORMAL HIGH (ref 70–99)
Potassium: 3.8 mEq/L (ref 3.5–5.1)
Sodium: 141 mEq/L (ref 135–145)
Total Bilirubin: 0.4 mg/dL (ref 0.3–1.2)
Total Protein: 8.6 g/dL — ABNORMAL HIGH (ref 6.0–8.3)

## 2012-10-10 LAB — ACETAMINOPHEN LEVEL: Acetaminophen (Tylenol), Serum: <15 ug/mL (ref 10–30)

## 2012-10-10 LAB — LIPASE, BLOOD: Lipase: 25 U/L (ref 11–59)

## 2012-10-10 MED ORDER — GI COCKTAIL ~~LOC~~
30.0000 mL | Freq: Once | ORAL | Status: AC
  Administered 2012-10-10: 30 mL via ORAL
  Filled 2012-10-10: qty 30

## 2012-10-10 MED ORDER — OMEPRAZOLE 20 MG PO CPDR: 20.0000 mg | DELAYED_RELEASE_CAPSULE | Freq: Every day | ORAL | Status: DC

## 2012-10-10 NOTE — ED Provider Notes (Signed)
CSN: [id number]     Arrival date & time 10/10/12  0917 History   First MD Initiated Contact with Patient 10/10/12 [phone]     Chief Complaint  Patient presents with  . Abdominal Pain  . Fever   (Consider location/radiation/quality/duration/timing/severity/associated sxs/prior Treatment) Patient is a 47 y.o. male presenting with abdominal pain and fever.  Abdominal Pain Pain location:  LUQ Pain quality: aching   Pain radiates to:  Does not radiate Pain severity:  Moderate Onset quality:  Gradual Timing:  Intermittent Progression:  Unchanged Chronicity:  New Context comment:  Recent ERCP with retained stone removal and stent removal Relieved by: tylenol, norco, zofran. Worsened by:  Nothing tried Associated symptoms: chills, fever and nausea   Associated symptoms: no chest pain, no cough, no diarrhea, no shortness of breath and no vomiting   Fever Associated symptoms: chills and nausea   Associated symptoms: no chest pain, no congestion, no cough, no diarrhea and no vomiting     Past Medical History  Diagnosis Date  . Hyperlipidemia   . Hypertension   . Numbness and tingling in hands     Hx: of  . Headache(784.0)     Hx: of when BP is elevated   Past Surgical History  Procedure Laterality Date  . Cholecystectomy N/A 07/11/2012    Procedure: LAPAROSCOPIC CHOLECYSTECTOMY WITH INTRAOPERATIVE CHOLANGIOGRAM;  Surgeon: Axel Filler, MD;  Location: [facility] OR;  Service: General;  Laterality: N/A;  . Ercp  07/16/2012  . Ercp N/A 07/16/2012    Procedure: ENDOSCOPIC RETROGRADE CHOLANGIOPANCREATOGRAPHY (ERCP);  Surgeon: Louis Meckel, MD;  Location: [facility] OR;  Service: Gastroenterology;  Laterality: N/A;  . Ercp N/A 10/06/2012    Procedure: ENDOSCOPIC RETROGRADE CHOLANGIOPANCREATOGRAPHY (ERCP);  Surgeon: Louis Meckel, MD;  Location: [facility];  Service: Endoscopy;  Laterality: N/A;   Family History  Problem Relation Age of Onset  . Gallstones Mother    History  Substance Use  Topics  . Smoking status: Never Smoker   . Smokeless tobacco: Never Used  . Alcohol Use: No    Review of Systems  Constitutional: Positive for fever and chills.  HENT: Negative for congestion.   Respiratory: Negative for cough and shortness of breath.   Cardiovascular: Negative for chest pain.  Gastrointestinal: Positive for nausea and abdominal pain. Negative for vomiting and diarrhea.  All other systems reviewed and are negative.    Allergies  Review of patient's allergies indicates no known allergies.  Home Medications   Current Outpatient Rx  Name  Route  Sig  Dispense  Refill  . acetaminophen (TYLENOL) 500 MG tablet   Oral   Take 500 mg by mouth every 6 (six) hours as needed for pain or fever.         Marland Kitchen HYDROcodone-acetaminophen (NORCO/VICODIN) 5-325 MG per tablet   Oral   Take 2 tablets by mouth every 4 (four) hours as needed for pain.   10 tablet   0   . lisinopril (PRINIVIL,ZESTRIL) 40 MG tablet   Oral   Take 1 tablet (40 mg total) by mouth daily.   90 tablet   3   . ondansetron (ZOFRAN ODT) 4 MG disintegrating tablet   Oral   Take 1 tablet (4 mg total) by mouth every 8 (eight) hours as needed for nausea.   10 tablet   0    BP 163/110  Pulse 99  Temp(Src) 98.3 F (36.8 C) (Oral)  Resp 20  SpO2 95% Physical Exam  Nursing note and vitals  reviewed. Constitutional: He is oriented to person, place, and time. He appears well-developed and well-nourished. No distress.  HENT:  Head: Normocephalic and atraumatic.  Mouth/Throat: Oropharynx is clear and moist.  Eyes: Conjunctivae are normal. Pupils are equal, round, and reactive to light. No scleral icterus.  Neck: Neck supple.  Cardiovascular: Normal rate, regular rhythm, normal heart sounds and intact distal pulses.   No murmur heard. Pulmonary/Chest: Effort normal and breath sounds normal. No stridor. No respiratory distress. He has no wheezes. He has no rales.  Abdominal: Soft. He exhibits no  distension. There is tenderness in the left upper quadrant. There is no rigidity, no rebound, no guarding and no CVA tenderness.  Musculoskeletal: Normal range of motion. He exhibits no edema.  Neurological: He is alert and oriented to person, place, and time.  Skin: Skin is warm and dry. No rash noted.  Psychiatric: He has a normal mood and affect. His behavior is normal.    ED Course  Procedures (including critical care time) Labs Review Labs Reviewed  CBC WITH DIFFERENTIAL - Abnormal; Notable for the following:    RBC 5.92 (*)    Hemoglobin 18.2 (*)    MCHC 36.5 (*)    Neutrophils Relative % 81 (*)    All other components within normal limits  COMPREHENSIVE METABOLIC PANEL - Abnormal; Notable for the following:    Glucose, Bld 131 (*)    Total Protein 8.6 (*)    All other components within normal limits  LIPASE, BLOOD  ACETAMINOPHEN LEVEL   Imaging Review No results found.  EKG Interpretation   None       MDM   1. Abdominal pain    47 yo male with recent ERCP presenting with continued abdominal pain, now in LUQ.  He was well appearing, with stable vital signs, afebrile, with a reassuring abdominal exam.  On repeated exams, he had mild LUQ tenderness with no peritoneal signs.  His lab tests were unremarkable including a normal WBC count.  He was concerned about a possible intra abdominal infection.  Even having an ERCP 5 days ago, his workup and abdominal exams are not consistent with bowel perforation or intraabdominal infection.  I do not think he needs abdominal imaging.  However, will treat LUQ pain with GI cocktail and omeprazole.  He will follow up with GI in 4 days.  He will return to the ED if his symptoms worsen or change.      Candyce Churn, MD 10/10/12 [phone]

## 2012-10-10 NOTE — ED Notes (Signed)
Pt reports abd pain, n/v, and fever/chills since Tuesday. Was seen and treated here on the 8th. Has been taking zofran, vicodin and tylenol. Denies diarrhea

## 2012-10-11 ENCOUNTER — Encounter (HOSPITAL_COMMUNITY): Payer: Self-pay | Admitting: Emergency Medicine

## 2012-10-11 ENCOUNTER — Ambulatory Visit (INDEPENDENT_AMBULATORY_CARE_PROVIDER_SITE_OTHER): Payer: BC Managed Care – PPO | Admitting: Physician Assistant

## 2012-10-11 ENCOUNTER — Emergency Department (HOSPITAL_COMMUNITY)
Admission: EM | Admit: 2012-10-11 | Discharge: 2012-10-11 | Disposition: A | Discharge status: Home / Self Care | Payer: BC Managed Care – PPO | Attending: Emergency Medicine | Admitting: Emergency Medicine

## 2012-10-11 VITALS — BP 154/104 | HR 83 | Temp 98.3°F | Resp 16 | Ht 62.25 in | Wt 157.4 lb

## 2012-10-11 DIAGNOSIS — I10 Essential (primary) hypertension: Secondary | ICD-10-CM | POA: Insufficient documentation

## 2012-10-11 DIAGNOSIS — Z8639 Personal history of other endocrine, nutritional and metabolic disease: Secondary | ICD-10-CM | POA: Insufficient documentation

## 2012-10-11 DIAGNOSIS — IMO0001 Reserved for inherently not codable concepts without codable children: Secondary | ICD-10-CM

## 2012-10-11 DIAGNOSIS — R6883 Chills (without fever): Secondary | ICD-10-CM

## 2012-10-11 DIAGNOSIS — Z862 Personal history of diseases of the blood and blood-forming organs and certain disorders involving the immune mechanism: Secondary | ICD-10-CM | POA: Insufficient documentation

## 2012-10-11 DIAGNOSIS — Z8719 Personal history of other diseases of the digestive system: Secondary | ICD-10-CM | POA: Insufficient documentation

## 2012-10-11 DIAGNOSIS — Z79899 Other long term (current) drug therapy: Secondary | ICD-10-CM | POA: Insufficient documentation

## 2012-10-11 LAB — COMPREHENSIVE METABOLIC PANEL
ALT: 37 U/L (ref 0–53)
AST: 25 U/L (ref 0–37)
Albumin: 4.2 g/dL (ref 3.5–5.2)
Alkaline Phosphatase: 56 U/L (ref 39–117)
BUN: 17 mg/dL (ref 6–23)
CO2: 26 mEq/L (ref 19–32)
Calcium: 9.8 mg/dL (ref 8.4–10.5)
Chloride: 99 mEq/L (ref 96–112)
Creatinine, Ser: 0.86 mg/dL (ref 0.50–1.35)
GFR calc Af Amer: >90 mL/min (ref >90)
GFR calc non Af Amer: >90 mL/min (ref >90)
Glucose, Bld: 97 mg/dL (ref 70–99)
Potassium: 3.6 mEq/L (ref 3.5–5.1)
Sodium: 136 mEq/L (ref 135–145)
Total Bilirubin: 0.5 mg/dL (ref 0.3–1.2)
Total Protein: 7.9 g/dL (ref 6.0–8.3)

## 2012-10-11 LAB — CBC WITH DIFFERENTIAL/PLATELET
Basophils Absolute: 0 10*3/uL (ref 0.0–0.1)
Basophils Relative: 0 % (ref 0–1)
Eosinophils Absolute: 0 10*3/uL (ref 0.0–0.7)
Eosinophils Relative: 0 % (ref 0–5)
HCT: 48.4 % (ref 39.0–52.0)
Hemoglobin: 17.3 g/dL — ABNORMAL HIGH (ref 13.0–17.0)
Lymphocytes Relative: 16 % (ref 12–46)
Lymphs Abs: 1.3 10*3/uL (ref 0.7–4.0)
MCH: 30.4 pg (ref 26.0–34.0)
MCV: 85.1 fL (ref 78.0–100.0)
Monocytes Absolute: 0.5 10*3/uL (ref 0.1–1.0)
Monocytes Relative: 6 % (ref 3–12)
Neutro Abs: 6.7 10*3/uL (ref 1.7–7.7)
Neutrophils Relative %: 78 % — ABNORMAL HIGH (ref 43–77)
Platelets: 246 10*3/uL (ref 150–400)
RBC: 5.69 MIL/uL (ref 4.22–5.81)
RDW: 13.7 % (ref 11.5–15.5)
WBC: 8.6 10*3/uL (ref 4.0–10.5)

## 2012-10-11 MED ORDER — LISINOPRIL 40 MG PO TABS: 40.0000 mg | ORAL_TABLET | Freq: Every day | ORAL | Status: DC

## 2012-10-11 NOTE — ED Notes (Signed)
EDP notified of pt's hypertension - pt instructed to follow up with his pcp

## 2012-10-11 NOTE — Progress Notes (Signed)
   [address] [place] [address]   Phone [phone]  Subjective:    Patient ID: Ethan Silva, male    DOB: 1965-01-22, 47 y.o.   MRN: [id number]  HPI Pt presents to clinic for BP recheck.  He has been in and out of the hospital since his gallbladder surgery 7/11 with complications.  Until about August he was on his lisinopril for his BP but he ran out of the medication and has not gotten a refill.  He has been to the ER every other day for the last 6 days and he BP has been high at all those visits.  He is still having abd pain and feeling bad but when he went today he was told to f/u with his PCP for BP check.  He is having no chest pain or SOB.  He tolerated the medication well when he was on it.  He does not check his BP at home.  Visit was interpreted by Staff CMA.  Review of Systems  Cardiovascular: Negative for chest pain and leg swelling.       Objective:   Physical Exam  Vitals reviewed. Constitutional: He appears well-developed and well-nourished.  HENT:  Head: Normocephalic and atraumatic.  Right Ear: External ear normal.  Left Ear: External ear normal.  Eyes: Conjunctivae are normal.  Neck: Normal range of motion.  Cardiovascular: Normal rate, regular rhythm and normal heart sounds.   No murmur heard. Pulmonary/Chest: Effort normal and breath sounds normal.  Skin: Skin is warm and dry.  Psychiatric: He has a normal mood and affect. His behavior is normal. Judgment and thought content normal.   Reviewed CMET from his ER visit today.    Assessment & Plan:  Elevated blood pressure - Plan: lisinopril (PRINIVIL,ZESTRIL) 40 MG tablet  Will give pt a 1 month supply - he will f/u here in 3 weeks to recheck his BP while he is on the medication.  He will continue with his planned f/u for his abd pain with the surgeon.    Benny Lennert PA-C 10/11/2012 5:11 PM

## 2012-10-11 NOTE — ED Provider Notes (Addendum)
CSN: [id number]     Arrival date & time 10/11/12  1016 History   First MD Initiated Contact with Patient 10/11/12 1031     Chief Complaint  Patient presents with  . Chills   (Consider location/radiation/quality/duration/timing/severity/associated sxs/prior Treatment) HPI  This is a 47 year old male with recent history of cholecystectomy with surgical complications including a bile leak and stent placement who presents with chills. Patient has been seen twice in the last 3-4 days for similar complaints. He had an ERCP done on 10/06/12 for removal of his biliary stent. He has been seen in emergency room twice since then. He has had reassuring lab work.  Patient states that he came in yesterday and was having left upper quadrant pain. He denies pain at this time but states he's continuing to felt chilled and hot. He states that he is taking Tylenol home.  He denies any other symptoms at this time.  Past Medical History  Diagnosis Date  . Hyperlipidemia   . Hypertension   . Numbness and tingling in hands     Hx: of  . Headache(784.0)     Hx: of when BP is elevated   Past Surgical History  Procedure Laterality Date  . Cholecystectomy N/A 07/11/2012    Procedure: LAPAROSCOPIC CHOLECYSTECTOMY WITH INTRAOPERATIVE CHOLANGIOGRAM;  Surgeon: Axel Filler, MD;  Location: [facility] OR;  Service: General;  Laterality: N/A;  . Ercp  07/16/2012  . Ercp N/A 07/16/2012    Procedure: ENDOSCOPIC RETROGRADE CHOLANGIOPANCREATOGRAPHY (ERCP);  Surgeon: Louis Meckel, MD;  Location: [facility] OR;  Service: Gastroenterology;  Laterality: N/A;  . Ercp N/A 10/06/2012    Procedure: ENDOSCOPIC RETROGRADE CHOLANGIOPANCREATOGRAPHY (ERCP);  Surgeon: Louis Meckel, MD;  Location: [facility];  Service: Endoscopy;  Laterality: N/A;   Family History  Problem Relation Age of Onset  . Gallstones Mother    History  Substance Use Topics  . Smoking status: Never Smoker   . Smokeless tobacco: Never Used  . Alcohol Use: No     Review of Systems  Constitutional: Positive for chills. Negative for fever.  Respiratory: Negative.  Negative for chest tightness and shortness of breath.   Cardiovascular: Negative.  Negative for chest pain.  Gastrointestinal: Negative.  Negative for nausea, vomiting and abdominal pain.  Genitourinary: Negative.  Negative for dysuria.  Neurological: Negative for headaches.  All other systems reviewed and are negative.    Allergies  Review of patient's allergies indicates no known allergies.  Home Medications   Current Outpatient Rx  Name  Route  Sig  Dispense  Refill  . acetaminophen (TYLENOL) 500 MG tablet   Oral   Take 500 mg by mouth every 6 (six) hours as needed for pain or fever.         Marland Kitchen HYDROcodone-acetaminophen (NORCO/VICODIN) 5-325 MG per tablet   Oral   Take 2 tablets by mouth every 4 (four) hours as needed for pain.   10 tablet   0   . lisinopril (PRINIVIL,ZESTRIL) 40 MG tablet   Oral   Take 1 tablet (40 mg total) by mouth daily.   90 tablet   3   . ondansetron (ZOFRAN ODT) 4 MG disintegrating tablet   Oral   Take 1 tablet (4 mg total) by mouth every 8 (eight) hours as needed for nausea.   10 tablet   0   . omeprazole (PRILOSEC) 20 MG capsule   Oral   Take 1 capsule (20 mg total) by mouth daily.   30 capsule  0    BP 162/110  Pulse 88  Temp(Src) 98.2 F (36.8 C) (Rectal)  Resp 16  SpO2 96% Physical Exam  Nursing note and vitals reviewed. Constitutional: He is oriented to person, place, and time. He appears well-developed and well-nourished. No distress.  HENT:  Head: Normocephalic and atraumatic.  Mouth/Throat: Oropharynx is clear and moist.  Cardiovascular: Normal rate, regular rhythm and normal heart sounds.   No murmur heard. Pulmonary/Chest: Effort normal and breath sounds normal. No respiratory distress. He has no wheezes.  Abdominal: Soft. Bowel sounds are normal. He exhibits no distension. There is no tenderness. There is no  rebound and no guarding.  Well-healed laparoscopic scars  Musculoskeletal: He exhibits no edema.  Lymphadenopathy:    He has no cervical adenopathy.  Neurological: He is alert and oriented to person, place, and time.  Skin: Skin is warm and dry.  Psychiatric: He has a normal mood and affect.    ED Course  Procedures (including critical care time) Labs Review Labs Reviewed  CBC WITH DIFFERENTIAL - Abnormal; Notable for the following:    Hemoglobin 17.3 (*)    Neutrophils Relative % 78 (*)    All other components within normal limits  COMPREHENSIVE METABOLIC PANEL   Imaging Review No results found.  EKG Interpretation   None       MDM   1. Chills (without fever)    This is a 47 year old male who presents with chills from home. This is his third ER visit since his ERCP on October 6. He denies any chest pain or abdominal pain at this time. He denies any headache. He is nontoxic-appearing on exam his vital signs are notable for blood pressure of 162/110.  He has a benign exam. A rectal temp was obtained and the patient is afebrile. Screening lab work was obtained given his recent surgical complications. CBC and CMP are within normal limits. Patient was reassured. He is to followup with gastroenterology as already scheduled.  After history, exam, and medical workup I feel the patient has been appropriately medically screened and is safe for discharge home. Pertinent diagnoses were discussed with the patient. Patient was given return precautions.  Shon Baton, MD 10/11/12 1337  Review of patient's prior charts reveal recent blood pressures of 160-170/110. He takes lisinopril at home and reports compliance. He denies any headache or chest pain. Blood pressures in July appeared to be in the 120s-130s/80s. At this time will defer further blood pressure evaluation and treatment his primary care Dr. Patient stated understanding.  Shon Baton, MD 10/11/12 1344

## 2012-10-11 NOTE — ED Notes (Signed)
He tells me he is feeling an occasional "cold" feeling in his stomach; followed by a hot flash which engulfs his entire trunk.  Seen here yesterday for same; and rx with omeperazole.  He further tells me he underwent lap. Chole. This July; and had to "have a tube put in after that".  This said tube was removed 10-06-2012.  He is in no distress.  He states "maybe they need to do x-ray or ultrasound to see if something is wrong inside".

## 2012-10-11 NOTE — ED Notes (Signed)
Pt states his face feels hot - oral temp 98. Will give pt ice pack for comfort

## 2012-10-12 ENCOUNTER — Emergency Department (HOSPITAL_COMMUNITY)
Admission: EM | Admit: 2012-10-12 | Discharge: 2012-10-12 | Disposition: A | Discharge status: Home / Self Care | Payer: BC Managed Care – PPO | Attending: Emergency Medicine | Admitting: Emergency Medicine

## 2012-10-12 ENCOUNTER — Encounter (HOSPITAL_COMMUNITY): Payer: Self-pay | Admitting: Emergency Medicine

## 2012-10-12 DIAGNOSIS — I10 Essential (primary) hypertension: Secondary | ICD-10-CM | POA: Insufficient documentation

## 2012-10-12 DIAGNOSIS — Z760 Encounter for issue of repeat prescription: Secondary | ICD-10-CM | POA: Insufficient documentation

## 2012-10-12 DIAGNOSIS — Z8639 Personal history of other endocrine, nutritional and metabolic disease: Secondary | ICD-10-CM | POA: Insufficient documentation

## 2012-10-12 DIAGNOSIS — Z862 Personal history of diseases of the blood and blood-forming organs and certain disorders involving the immune mechanism: Secondary | ICD-10-CM | POA: Insufficient documentation

## 2012-10-12 DIAGNOSIS — Z79899 Other long term (current) drug therapy: Secondary | ICD-10-CM | POA: Insufficient documentation

## 2012-10-12 MED ORDER — HYDROCODONE-ACETAMINOPHEN 5-325 MG PO TABS: 1.0000 | ORAL_TABLET | ORAL | Status: DC | PRN

## 2012-10-12 NOTE — ED Notes (Addendum)
Pt presents wanting a medication refill, Norco.  Pt has an appointment scheduled 10/14 with a GI MD.  Pt has been seen for GI complaints several times this week.  Pt has not been taking Prilosec.  Sts he did not know what it was for.

## 2012-10-12 NOTE — ED Provider Notes (Signed)
CSN: [id number]     Arrival date & time 10/12/12  1033 History   First MD Initiated Contact with Patient 10/12/12 1043     Chief Complaint  Patient presents with  . Medication Refill   (Consider location/radiation/quality/duration/timing/severity/associated sxs/prior Treatment) HPI Interview done through Spanish interpreter Patient presents to the emergency department requesting a refill of his hydrocodone. In July he had a cholecystectomy and on October 6 he had a endoscopic retrograde cholangiopancreatography done by Dr. Arlyce Dice. He comes to the ED because he is out of his pain medication. He see's his PCP this Tuesday for follow-up. He also takes 1 Tylenol a day to help with pain as well. He denies having worsening belly pain but is still having mild discomfort from the procedure. Denies taking more than prescribed. Denies having headache, fevers, nausea, vomiting or diarrhea.      Past Medical History  Diagnosis Date  . Hyperlipidemia   . Hypertension   . Numbness and tingling in hands     Hx: of  . Headache(784.0)     Hx: of when BP is elevated   Past Surgical History  Procedure Laterality Date  . Cholecystectomy N/A 07/11/2012    Procedure: LAPAROSCOPIC CHOLECYSTECTOMY WITH INTRAOPERATIVE CHOLANGIOGRAM;  Surgeon: Axel Filler, MD;  Location: [facility] OR;  Service: General;  Laterality: N/A;  . Ercp  07/16/2012  . Ercp N/A 07/16/2012    Procedure: ENDOSCOPIC RETROGRADE CHOLANGIOPANCREATOGRAPHY (ERCP);  Surgeon: Louis Meckel, MD;  Location: [facility] OR;  Service: Gastroenterology;  Laterality: N/A;  . Ercp N/A 10/06/2012    Procedure: ENDOSCOPIC RETROGRADE CHOLANGIOPANCREATOGRAPHY (ERCP);  Surgeon: Louis Meckel, MD;  Location: [facility];  Service: Endoscopy;  Laterality: N/A;   Family History  Problem Relation Age of Onset  . Gallstones Mother    History  Substance Use Topics  . Smoking status: Never Smoker   . Smokeless tobacco: Never Used  . Alcohol Use: No    Review of  Systems The patient denies anorexia, fever, weight loss,, vision loss, decreased hearing, hoarseness, chest pain, syncope, dyspnea on exertion, peripheral edema, balance deficits, hemoptysis, abdominal pain, melena, hematochezia, severe indigestion/heartburn, hematuria, incontinence, genital sores, muscle weakness, suspicious skin lesions, transient blindness, difficulty walking, depression, unusual weight change, abnormal bleeding, enlarged lymph nodes, angioedema, and breast masses.  Allergies  Review of patient's allergies indicates no known allergies.  Home Medications   Current Outpatient Rx  Name  Route  Sig  Dispense  Refill  . acetaminophen (TYLENOL) 500 MG tablet   Oral   Take 500 mg by mouth every 6 (six) hours as needed for pain or fever.         Marland Kitchen HYDROcodone-acetaminophen (NORCO/VICODIN) 5-325 MG per tablet   Oral   Take 2 tablets by mouth every 4 (four) hours as needed for pain.   10 tablet   0   . lisinopril (PRINIVIL,ZESTRIL) 40 MG tablet   Oral   Take 1 tablet (40 mg total) by mouth daily.   30 tablet   0   . omeprazole (PRILOSEC) 20 MG capsule   Oral   Take 1 capsule (20 mg total) by mouth daily.   30 capsule   0   . ondansetron (ZOFRAN ODT) 4 MG disintegrating tablet   Oral   Take 1 tablet (4 mg total) by mouth every 8 (eight) hours as needed for nausea.   10 tablet   0   . HYDROcodone-acetaminophen (NORCO/VICODIN) 5-325 MG per tablet   Oral   Take 1  tablet by mouth every 4 (four) hours as needed for pain.   12 tablet   0    BP 148/100  Pulse 77  Temp(Src) 98.1 F (36.7 C) (Oral)  Resp 16  SpO2 97% Physical Exam  Nursing note and vitals reviewed. Constitutional: He appears well-developed and well-nourished. No distress.  HENT:  Head: Normocephalic and atraumatic.  Eyes: Pupils are equal, round, and reactive to light.  Neck: Normal range of motion. Neck supple.  Cardiovascular: Normal rate and regular rhythm.   Pulmonary/Chest: Effort  normal.  Abdominal: Soft. Bowel sounds are normal. He exhibits no distension, no fluid wave, no ascites and no mass. There is tenderness (very mild and diffuse). There is no rigidity, no rebound, no guarding, no CVA tenderness, no tenderness at McBurney's point and negative Murphy's sign.    Neurological: He is alert.  Skin: Skin is warm and dry.    ED Course  Procedures (including critical care time) Labs Review Labs Reviewed - No data to display Imaging Review No results found.  EKG Interpretation   None       MDM   1. Medication refill    Pt not having any new symptoms or worsened pain. Having pain still and pain medications are gone. Will refill meds, told he needs to ask his surgeon from now on for pain medication. He says he understands.  47 y.o.Ethan Silva's evaluation in the Emergency Department is complete. It has been determined that no acute conditions requiring further emergency intervention are present at this time. The patient/guardian have been advised of the diagnosis and plan. We have discussed signs and symptoms that warrant return to the ED, such as changes or worsening in symptoms.  Vital signs are stable at discharge. Filed Vitals:   10/12/12 1045  BP: 148/100  Pulse: 77  Temp: 98.1 F (36.7 C)  Resp: 16    Patient/guardian has voiced understanding and agreed to follow-up with the PCP or specialist.     Dorthula Matas, PA-C 10/12/12 1141

## 2012-10-12 NOTE — ED Provider Notes (Signed)
Medical screening examination/treatment/procedure(s) were performed by non-physician practitioner and as supervising physician I was immediately available for consultation/collaboration.  Juandaniel Manfredo T Cha Gomillion, MD 10/12/12 1545 

## 2012-10-13 ENCOUNTER — Encounter: Payer: Self-pay | Admitting: Gastroenterology

## 2012-10-13 ENCOUNTER — Ambulatory Visit (INDEPENDENT_AMBULATORY_CARE_PROVIDER_SITE_OTHER)
Admission: RE | Admit: 2012-10-13 | Discharge: 2012-10-13 | Disposition: A | Discharge status: Home / Self Care | Payer: BC Managed Care – PPO | Source: Ambulatory Visit | Attending: Gastroenterology | Admitting: Gastroenterology

## 2012-10-13 ENCOUNTER — Ambulatory Visit (INDEPENDENT_AMBULATORY_CARE_PROVIDER_SITE_OTHER): Payer: BC Managed Care – PPO | Admitting: Gastroenterology

## 2012-10-13 VITALS — BP 140/90 | HR 80 | Temp 99.1°F | Ht 62.0 in | Wt 159.2 lb

## 2012-10-13 DIAGNOSIS — K838 Other specified diseases of biliary tract: Secondary | ICD-10-CM

## 2012-10-13 DIAGNOSIS — R1011 Right upper quadrant pain: Secondary | ICD-10-CM

## 2012-10-13 MED ORDER — IOHEXOL 300 MG/ML  SOLN
100.0000 mL | Freq: Once | INTRAMUSCULAR | Status: AC | PRN
  Administered 2012-10-13: 100 mL via INTRAVENOUS

## 2012-10-13 NOTE — Progress Notes (Signed)
Reviewed and agree with management.  A small but significant bile duct leak should be considered. Barbette Hair. Arlyce Dice, M.D., Joella Prince D. Arlyce Dice, MD, [facility] [phone]

## 2012-10-13 NOTE — Progress Notes (Signed)
10/13/2012 Ethan Silva [id number] 01/23/1965   History of Present Illness: This is a 47 year old male who is known to Dr. Arlyce Dice for recent issues with bile duct leak. He had a laparoscopic cholecystectomy in July at which time a bile leak occurred. 5 days later he underwent ERCP with stent placement by Dr. Arlyce Dice.  He returned last week on 10-6 for repeat ERCP and stent removal. Upon removal of the stent he also had 2 bile duct stones removed. Final cholangiogram was normal confirming healing of the bile leak. Since that procedure he has been complaining of persistent abdominal pain in the upper/mid abdomen, just left of the midline. Also complaining of chills and possible fever. Not much appetite. He has been in the ER on 3 occasions in the past week. Labs reveal a normal CBC, BMP, and lipase. He has been discharged on each occasion with pain medication and anti-emetics. He was scheduled for this appointment in our office today for followup. He says the pain has not gotten any better, keeps him from sleeping at night, and is constant throughout the day. Complains of nausea but no vomiting. No repeat imaging studies of any kind were performed during the ED visit.  Today at his visit he is accompanied by to relatives and also a Nurse, learning disability from [facility]. Most of the information was obtained via a Nurse, learning disability.   Current Medications, Allergies, Past Medical History, Past Surgical History, Family History and Social History were reviewed in [employer] record.  Physical Exam: BP 140/90  Pulse 80  Temp(Src) 99.1 F (37.3 C)  Ht 5\' 2"  (1.575 m)  Wt 72.235 kg (159 lb 4 oz)  BMI 29.12 kg/m2 General: Well developed, Hispanic male in no acute distress; appears slightly anxious Head: Normocephalic and atraumatic Eyes:  Sclerae anicteric, conjunctiva pink  Ears: Normal auditory acuity Lungs: Clear throughout to auscultation Heart: Regular rate and rhythm Abdomen: Soft, non-distended.   BS present.  Mid-abdominal TTP (just left of the mid-line) without R/R/G. Musculoskeletal: Symmetrical with no gross deformities  Extremities: No edema  Neurological: Alert oriented x 4, grossly nonfocal Psychological:  Alert and cooperative. Normal mood and affect.  Appears anxious.  Assessment and Recommendations: -Abdominal pain in a 47 year old male who recently had cholecystectomy with a bile duct leak, requiring stent placement. Stent was removed by ERCP on 10/6 and bile duct stones were removed at that time as well. Final cholangiogram was normal. He continues with abdominal pain more in the mid abdomen and left upper quadrant. Unsure if this is related to his biliary issues or if this is a separate problem. Will get a CT scan of the abdomen and pelvis stat today. If the CT scan is completely negative, then need to consider HIDA scan for further evaluation.

## 2012-10-13 NOTE — Patient Instructions (Signed)
  You have been scheduled for a CT scan of the abdomen and pelvis at [facility] ([address]---this is in the same building as Architectural technologist).   You are scheduled today  10-13-2012 at 4:30 am. You should  Arrive at 4:15 PM.  prior to your appointment time for registration. Please follow the written instructions below on the day of your exam:  WARNING: IF YOU ARE ALLERGIC TO IODINE/X-RAY DYE, PLEASE NOTIFY RADIOLOGY IMMEDIATELY AT [phone]! YOU WILL BE GIVEN A 13 HOUR PREMEDICATION PREP.  1) Do not eat or drink until after the test.2) You have been given 2 bottles of oral contrast to drink. The solution may taste  better if refrigerated, but do NOT add ice or any other liquid to this solution. Shake  well before drinking.    Drink 1 bottle of contrast @ 2:30 PM  (2 hours prior to your exam)  Drink 1 bottle of contrast @ 3:30 PM (1 hour prior to your exam)  You may take any medications as prescribed with a small amount of water except for the following: Metformin, Glucophage, Glucovance, Avandamet, Riomet, Fortamet, Actoplus Met, Janumet, Glumetza or Metaglip. The above medications must be held the day of the exam AND 48 hours after the exam.  The purpose of you drinking the oral contrast is to aid in the visualization of your intestinal tract. The contrast solution may cause some diarrhea. Before your exam is started, you will be given a small amount of fluid to drink. Depending on your individual set of symptoms, you may also receive an intravenous injection of x-ray contrast/dye. Plan on being at [facility] for 30 minutes or long, depending on the type of exam you are having performed.  If you have any questions regarding your exam or if you need to reschedule, you may call the CT department at [phone] between the hours of 8:00 am and 5:00 pm, Monday-Friday.  ________________________________________________________________________

## 2012-10-14 ENCOUNTER — Telehealth: Payer: Self-pay | Admitting: Gastroenterology

## 2012-10-14 ENCOUNTER — Ambulatory Visit: Payer: BC Managed Care – PPO | Admitting: Gastroenterology

## 2012-10-14 ENCOUNTER — Encounter: Payer: Self-pay | Admitting: Gastroenterology

## 2012-10-14 MED ORDER — DICYCLOMINE HCL 10 MG PO CAPS: ORAL_CAPSULE | ORAL | Status: DC

## 2012-10-14 NOTE — Telephone Encounter (Signed)
Patient given results and recommendation. Scheduled with Dr. Arlyce Dice on 10/17/12 at 8:45 AM. Rx sent to pharmacy.

## 2012-10-14 NOTE — Telephone Encounter (Signed)
Patient's CT scan is completely negative.  Bile leak has resolved.  No other cause for his symptoms by CT scan.  Dr. Arlyce Dice would like to see him back sometime next week if we could please try to get him an appointment (which may be impossible).  We can give him some bentyl 10 mg to take three times a day to see if it will help in the meantime.  The medication is for intestinal spasm/cramping.  No pain medication.  Thank you,  Jess

## 2012-10-14 NOTE — Telephone Encounter (Signed)
Patient is asking for CT results. Please, advise 

## 2012-10-17 ENCOUNTER — Encounter: Payer: Self-pay | Admitting: Gastroenterology

## 2012-10-17 ENCOUNTER — Ambulatory Visit (INDEPENDENT_AMBULATORY_CARE_PROVIDER_SITE_OTHER): Payer: BC Managed Care – PPO | Admitting: Gastroenterology

## 2012-10-17 ENCOUNTER — Encounter (HOSPITAL_COMMUNITY): Payer: Self-pay | Admitting: Emergency Medicine

## 2012-10-17 ENCOUNTER — Emergency Department (HOSPITAL_COMMUNITY)
Admission: EM | Admit: 2012-10-17 | Discharge: 2012-10-17 | Disposition: A | Discharge status: Home / Self Care | Payer: BC Managed Care – PPO | Attending: Emergency Medicine | Admitting: Emergency Medicine

## 2012-10-17 VITALS — BP 130/100 | HR 73 | Ht 62.0 in | Wt 156.0 lb

## 2012-10-17 DIAGNOSIS — R109 Unspecified abdominal pain: Secondary | ICD-10-CM | POA: Insufficient documentation

## 2012-10-17 DIAGNOSIS — Z79899 Other long term (current) drug therapy: Secondary | ICD-10-CM | POA: Insufficient documentation

## 2012-10-17 DIAGNOSIS — Z8719 Personal history of other diseases of the digestive system: Secondary | ICD-10-CM | POA: Insufficient documentation

## 2012-10-17 DIAGNOSIS — I10 Essential (primary) hypertension: Secondary | ICD-10-CM | POA: Insufficient documentation

## 2012-10-17 DIAGNOSIS — Z8639 Personal history of other endocrine, nutritional and metabolic disease: Secondary | ICD-10-CM | POA: Insufficient documentation

## 2012-10-17 DIAGNOSIS — Z862 Personal history of diseases of the blood and blood-forming organs and certain disorders involving the immune mechanism: Secondary | ICD-10-CM | POA: Insufficient documentation

## 2012-10-17 DIAGNOSIS — Z9089 Acquired absence of other organs: Secondary | ICD-10-CM | POA: Insufficient documentation

## 2012-10-17 DIAGNOSIS — R1011 Right upper quadrant pain: Secondary | ICD-10-CM

## 2012-10-17 MED ORDER — GI COCKTAIL ~~LOC~~
30.0000 mL | Freq: Once | ORAL | Status: AC
  Administered 2012-10-17: 30 mL via ORAL
  Filled 2012-10-17: qty 30

## 2012-10-17 MED ORDER — HYOSCYAMINE SULFATE ER 0.375 MG PO TBCR: EXTENDED_RELEASE_TABLET | ORAL | Status: DC

## 2012-10-17 MED ORDER — ALPRAZOLAM 0.5 MG PO TABS: ORAL_TABLET | ORAL | Status: DC

## 2012-10-17 NOTE — Assessment & Plan Note (Signed)
The patient has very nonspecific GI complaints which I believe is causing severe anxiety.  There is no evidence for underlying GI pathology including retained bile duct stones or bile duct leak.  At this point I believe he is mostly suffering from anxiety when he has any type of GI disturbance.  Recommendations #1 begin Xanax 0.5 mg q. 8H #2 Levbid 0.  375 mg twice a day #3 continue protonix

## 2012-10-17 NOTE — ED Notes (Signed)
Pt states that he had galbladder surgery on the 6th of October and has been having ABD pain since.  Pt states he is supposed to have surgery again for the post-op pain.

## 2012-10-17 NOTE — ED Provider Notes (Signed)
CSN: [id number]     Arrival date & time 10/17/12  0630 History   First MD Initiated Contact with Patient 10/17/12 [phone]     Chief Complaint  Patient presents with  . Abdominal Pain   (Consider location/radiation/quality/duration/timing/severity/associated sxs/prior Treatment) HPI 47 year old male with abdominal pain. The patient points to an area between his epigastrium and umbilicus is the region hurts. This has been ongoing issue since a recent ERCP. Describes the pain as constant. Sometimes worse shortly after eating. It does not radiate. He feels like he needs to burp frequently. Mild nausea. No vomiting. No diarrhea. No fevers or chills. Patient with multiple recent ER evaluations as well as gastroenterology. Been taking prescribed medications including recently prescribed Bentyl as well as prilosec with no significant change in symptoms.  Recent labs and CT abdomen pelvis were reviewed. These have been fairly unremarkable. Additional history provided by patient's family translating.  Past Medical History  Diagnosis Date  . Hyperlipidemia   . Hypertension   . Numbness and tingling in hands     Hx: of  . Headache(784.0)     Hx: of when BP is elevated  . Bile duct leak    Past Surgical History  Procedure Laterality Date  . Cholecystectomy N/A 07/11/2012    Procedure: LAPAROSCOPIC CHOLECYSTECTOMY WITH INTRAOPERATIVE CHOLANGIOGRAM;  Surgeon: Axel Filler, MD;  Location: [facility] OR;  Service: General;  Laterality: N/A;  . Ercp  07/16/2012  . Ercp N/A 07/16/2012    Procedure: ENDOSCOPIC RETROGRADE CHOLANGIOPANCREATOGRAPHY (ERCP);  Surgeon: Louis Meckel, MD;  [facility] OR;  Service: Gastroenterology;  Laterality: N/A;  . Ercp N/A 10/06/2012    Procedure: ENDOSCOPIC RETROGRADE CHOLANGIOPANCREATOGRAPHY (ERCP);  Surgeon: Louis Meckel, MD;  Location: [facility];  Service: Endoscopy;  Laterality: N/A;   Family History  Problem Relation Age of Onset  . Gallstones Mother   . Diabetes  Paternal Uncle    History  Substance Use Topics  . Smoking status: Never Smoker   . Smokeless tobacco: Never Used  . Alcohol Use: No    Review of Systems  All systems reviewed and negative, other than as noted in HPI.   Allergies  Review of patient's allergies indicates no known allergies.  Home Medications   Current Outpatient Rx  Name  Route  Sig  Dispense  Refill  . acetaminophen (TYLENOL) 500 MG tablet   Oral   Take 500 mg by mouth every 6 (six) hours as needed for pain or fever.         . dicyclomine (BENTYL) 10 MG capsule      Take one tablet PO TID for spasms, cramping   90 capsule   0   . HYDROcodone-acetaminophen (NORCO/VICODIN) 5-325 MG per tablet   Oral   Take 1 tablet by mouth every 4 (four) hours as needed for pain.   12 tablet   0   . lisinopril (PRINIVIL,ZESTRIL) 40 MG tablet   Oral   Take 1 tablet (40 mg total) by mouth daily.   30 tablet   0   . omeprazole (PRILOSEC) 20 MG capsule   Oral   Take 1 capsule (20 mg total) by mouth daily.   30 capsule   0   . ondansetron (ZOFRAN ODT) 4 MG disintegrating tablet   Oral   Take 1 tablet (4 mg total) by mouth every 8 (eight) hours as needed for nausea.   10 tablet   0    BP 142/94  Pulse 94  Temp(Src) 98  F (36.7 C) (Oral)  Resp 22  Ht 5\' 2"  (1.575 m)  Wt 159 lb (72.122 kg)  BMI 29.07 kg/m2  SpO2 93% Physical Exam  Nursing note and vitals reviewed. Constitutional: He appears well-developed and well-nourished. No distress.  HENT:  Head: Normocephalic and atraumatic.  Eyes: Conjunctivae are normal. Right eye exhibits no discharge. Left eye exhibits no discharge.  Neck: Neck supple.  Cardiovascular: Normal rate, regular rhythm and normal heart sounds.  Exam reveals no gallop and no friction rub.   No murmur heard. Pulmonary/Chest: Effort normal and breath sounds normal. No respiratory distress.  Abdominal: Soft. He exhibits no distension. There is tenderness. There is no rebound and no  guarding.  Musculoskeletal: He exhibits no edema and no tenderness.  Neurological: He is alert.  Skin: Skin is warm and dry.  Psychiatric: He has a normal mood and affect. His behavior is normal. Thought content normal.    ED Course  Procedures (including critical care time) Labs Review Labs Reviewed - No data to display Imaging Review No results found.  EKG Interpretation   None       MDM   1. Abdominal pain    A 47 year old male with abdominal pain. Recent evaluations for the same. Recent imaging, including CT a/p, and laboratory testing was reviewed. I feel very little utility in additional testing in emergency room at this time with no change in symptoms since last evaluation. Patient has a followup appointment with GI later this morning.       Raeford Razor, MD 10/21/12 1452

## 2012-10-17 NOTE — Patient Instructions (Signed)
We are giving you a written prescription of Xanax Follow up in 3-4 weeks

## 2012-10-17 NOTE — Progress Notes (Signed)
History of Present Illness:  [name] continues to complain of diffuse abdominal pain with indigestion, cramping and occasional diarrhea.  Symptoms are very nonspecific.  He's had several ER visits and a recent office visit.  Lab studies and CT were unremarkable.  When he gets discomfort he feels he cannot breathe.  He is without fever or jaundice.  Today he is pain-free unless he palpates his abdomen at which point he says he develops soreness.    Review of Systems: Pertinent positive and negative review of systems were noted in the above HPI section. All other review of systems were otherwise negative.    Current Medications, Allergies, Past Medical History, Past Surgical History, Family History and Social History were reviewed in [employer] electronic medical record  Vital signs were reviewed in today's medical record. Physical Exam: General: Well developed , well nourished, no acute distress Skin: anicteric Head: Normocephalic and atraumatic Eyes:  sclerae anicteric, EOMI Ears: Normal auditory acuity Mouth: No deformity or lesions Lungs: Clear throughout to auscultation Heart: Regular rate and rhythm; no murmurs, rubs or bruits Abdomen: Soft, and non distended. No masses, hepatosplenomegaly or hernias noted. Normal Bowel sounds.  There is minimal tenderness to palpation in the midepigastrium Rectal:deferred Musculoskeletal: Symmetrical with no gross deformities  Pulses:  Normal pulses noted Extremities: No clubbing, cyanosis, edema or deformities noted Neurological: Alert oriented x 4, grossly nonfocal Psychological:  Alert and cooperative. Normal mood and affect

## 2012-10-27 ENCOUNTER — Encounter (HOSPITAL_COMMUNITY): Payer: Self-pay | Admitting: Emergency Medicine

## 2012-10-27 DIAGNOSIS — Z8669 Personal history of other diseases of the nervous system and sense organs: Secondary | ICD-10-CM | POA: Insufficient documentation

## 2012-10-27 DIAGNOSIS — Z79899 Other long term (current) drug therapy: Secondary | ICD-10-CM | POA: Insufficient documentation

## 2012-10-27 DIAGNOSIS — K219 Gastro-esophageal reflux disease without esophagitis: Secondary | ICD-10-CM | POA: Insufficient documentation

## 2012-10-27 DIAGNOSIS — R109 Unspecified abdominal pain: Secondary | ICD-10-CM | POA: Insufficient documentation

## 2012-10-27 DIAGNOSIS — Z8719 Personal history of other diseases of the digestive system: Secondary | ICD-10-CM | POA: Insufficient documentation

## 2012-10-27 DIAGNOSIS — E785 Hyperlipidemia, unspecified: Secondary | ICD-10-CM | POA: Insufficient documentation

## 2012-10-27 DIAGNOSIS — F411 Generalized anxiety disorder: Secondary | ICD-10-CM | POA: Insufficient documentation

## 2012-10-27 DIAGNOSIS — I10 Essential (primary) hypertension: Secondary | ICD-10-CM | POA: Insufficient documentation

## 2012-10-27 LAB — COMPREHENSIVE METABOLIC PANEL
ALT: 40 U/L (ref 0–53)
AST: 24 U/L (ref 0–37)
Albumin: 4 g/dL (ref 3.5–5.2)
Alkaline Phosphatase: 59 U/L (ref 39–117)
CO2: 26 mEq/L (ref 19–32)
Calcium: 9.5 mg/dL (ref 8.4–10.5)
Creatinine, Ser: 0.86 mg/dL (ref 0.50–1.35)
GFR calc non Af Amer: >90 mL/min (ref >90)
Potassium: 3.6 mEq/L (ref 3.5–5.1)
Sodium: 137 mEq/L (ref 135–145)
Total Bilirubin: 0.3 mg/dL (ref 0.3–1.2)
Total Protein: 7.6 g/dL (ref 6.0–8.3)

## 2012-10-27 LAB — CBC WITH DIFFERENTIAL/PLATELET
Basophils Absolute: 0 10*3/uL (ref 0.0–0.1)
Basophils Relative: 0 % (ref 0–1)
Eosinophils Absolute: 0.1 10*3/uL (ref 0.0–0.7)
Eosinophils Relative: 1 % (ref 0–5)
HCT: 45.2 % (ref 39.0–52.0)
Hemoglobin: 16.3 g/dL (ref 13.0–17.0)
Lymphocytes Relative: 26 % (ref 12–46)
Lymphs Abs: 2.1 10*3/uL (ref 0.7–4.0)
MCH: 30.9 pg (ref 26.0–34.0)
MCHC: 36.1 g/dL — ABNORMAL HIGH (ref 30.0–36.0)
MCV: 85.8 fL (ref 78.0–100.0)
Monocytes Absolute: 0.5 10*3/uL (ref 0.1–1.0)
Neutrophils Relative %: 67 % (ref 43–77)
Platelets: 226 10*3/uL (ref 150–400)
RBC: 5.27 MIL/uL (ref 4.22–5.81)
RDW: 13.5 % (ref 11.5–15.5)
WBC: 8.4 10*3/uL (ref 4.0–10.5)

## 2012-10-27 NOTE — ED Notes (Signed)
No answer in wr.

## 2012-10-27 NOTE — ED Notes (Signed)
Pt. reports nausea and vomitting ( x5) this evening , denies diarrhea / fever or chills.  Occasional mid abdominal pain only when actively vomitting . No pain at arrival.

## 2012-10-28 ENCOUNTER — Emergency Department (HOSPITAL_COMMUNITY): Payer: BC Managed Care – PPO

## 2012-10-28 ENCOUNTER — Emergency Department (HOSPITAL_COMMUNITY)
Admission: EM | Admit: 2012-10-28 | Discharge: 2012-10-28 | Disposition: A | Discharge status: Home / Self Care | Payer: BC Managed Care – PPO | Attending: Emergency Medicine | Admitting: Emergency Medicine

## 2012-10-28 DIAGNOSIS — F419 Anxiety disorder, unspecified: Secondary | ICD-10-CM

## 2012-10-28 DIAGNOSIS — K219 Gastro-esophageal reflux disease without esophagitis: Secondary | ICD-10-CM

## 2012-10-28 LAB — LIPASE, BLOOD: Lipase: 42 U/L (ref 11–59)

## 2012-10-28 MED ORDER — ONDANSETRON 8 MG PO TBDP: ORAL_TABLET | ORAL | Status: DC

## 2012-10-28 MED ORDER — GI COCKTAIL ~~LOC~~
30.0000 mL | Freq: Once | ORAL | Status: AC
  Administered 2012-10-28: 30 mL via ORAL
  Filled 2012-10-28: qty 30

## 2012-10-28 MED ORDER — ONDANSETRON 4 MG PO TBDP
8.0000 mg | ORAL_TABLET | Freq: Once | ORAL | Status: AC
  Administered 2012-10-28: 8 mg via ORAL
  Filled 2012-10-28: qty 2

## 2012-10-28 MED ORDER — SUCRALFATE 1 GM/10ML PO SUSP
1.0000 g | Freq: Four times a day (QID) | ORAL | Status: DC
Start: 2012-10-28 — End: 2012-11-12

## 2012-10-28 NOTE — ED Provider Notes (Signed)
CSN: [id number]     Arrival date & time 10/27/12  2010 History   First MD Initiated Contact with Patient 10/28/12 0036     Chief Complaint  Patient presents with  . Emesis   (Consider location/radiation/quality/duration/timing/severity/associated sxs/prior Treatment) Patient is a 47 y.o. male presenting with vomiting. The history is provided by the patient.  Emesis Severity:  Mild Duration:  1 day Timing:  Rare Quality:  Stomach contents Progression:  Resolved Chronicity:  Recurrent Recent urination:  Normal Relieved by:  Nothing Worsened by:  Nothing tried Ineffective treatments:  None tried Associated symptoms: abdominal pain   Associated symptoms: no diarrhea   Risk factors: no alcohol use     Past Medical History  Diagnosis Date  . Hyperlipidemia   . Hypertension   . Numbness and tingling in hands     Hx: of  . Headache(784.0)     Hx: of when BP is elevated  . Bile duct leak    Past Surgical History  Procedure Laterality Date  . Cholecystectomy N/A 07/11/2012    Procedure: LAPAROSCOPIC CHOLECYSTECTOMY WITH INTRAOPERATIVE CHOLANGIOGRAM;  Surgeon: Axel Filler, MD;  Location: [facility] OR;  Service: General;  Laterality: N/A;  . Ercp  07/16/2012  . Ercp N/A 07/16/2012    Procedure: ENDOSCOPIC RETROGRADE CHOLANGIOPANCREATOGRAPHY (ERCP);  Surgeon: Louis Meckel, MD;  Location: [facility] OR;  Service: Gastroenterology;  Laterality: N/A;  . Ercp N/A 10/06/2012    Procedure: ENDOSCOPIC RETROGRADE CHOLANGIOPANCREATOGRAPHY (ERCP);  Surgeon: Louis Meckel, MD;  Location: [facility];  Service: Endoscopy;  Laterality: N/A;   Family History  Problem Relation Age of Onset  . Gallstones Mother   . Diabetes Paternal Uncle    History  Substance Use Topics  . Smoking status: Never Smoker   . Smokeless tobacco: Never Used  . Alcohol Use: No    Review of Systems  Gastrointestinal: Positive for vomiting and abdominal pain. Negative for diarrhea and constipation.  All other systems  reviewed and are negative.    Allergies  Review of patient's allergies indicates no known allergies.  Home Medications   Current Outpatient Rx  Name  Route  Sig  Dispense  Refill  . HYDROcodone-acetaminophen (NORCO/VICODIN) 5-325 MG per tablet   Oral   Take 1 tablet by mouth every 4 (four) hours as needed for pain.   12 tablet   0   . lisinopril (PRINIVIL,ZESTRIL) 40 MG tablet   Oral   Take 1 tablet (40 mg total) by mouth daily.   30 tablet   0   . omeprazole (PRILOSEC) 20 MG capsule   Oral   Take 1 capsule (20 mg total) by mouth daily.   30 capsule   0    BP 151/97  Pulse 73  Temp(Src) 98.3 F (36.8 C) (Oral)  Resp 16  Wt 163 lb (73.936 kg)  BMI 29.81 kg/m2  SpO2 93% Physical Exam  Constitutional: He is oriented to person, place, and time. He appears well-developed and well-nourished. No distress.  HENT:  Head: Normocephalic and atraumatic.  Mouth/Throat: Oropharynx is clear and moist.  Eyes: Conjunctivae are normal. Pupils are equal, round, and reactive to light.  Neck: Normal range of motion. Neck supple.  Cardiovascular: Normal rate, regular rhythm and intact distal pulses.   Pulmonary/Chest: Effort normal and breath sounds normal. He has no wheezes. He has no rales.  Abdominal: Soft. Bowel sounds are normal. There is no tenderness. There is no rebound and no guarding.  Musculoskeletal: Normal range of motion.  Neurological: He is alert and oriented to person, place, and time. He has normal reflexes.  Skin: Skin is warm and dry.  Psychiatric: His mood appears anxious.    ED Course  Procedures (including critical care time) Labs Review Labs Reviewed  CBC WITH DIFFERENTIAL - Abnormal; Notable for the following:    MCHC 36.1 (*)    All other components within normal limits  COMPREHENSIVE METABOLIC PANEL - Abnormal; Notable for the following:    Glucose, Bld 152 (*)    All other components within normal limits  LIPASE, BLOOD   Imaging Review Dg Abd  Acute W/chest  10/28/2012   CLINICAL DATA:  Low abdominal pain.  EXAM: ACUTE ABDOMEN SERIES (ABDOMEN 2 VIEW & CHEST 1 VIEW)  COMPARISON:  CT 10/13/2012 and earlier studies  FINDINGS: There is no evidence of dilated bowel loops or free intraperitoneal air. Surgical clips in the right upper abdomen. No radiopaque calculi or other significant radiographic abnormality is seen. Heart size and mediastinal contours are within normal limits. Both lungs are clear. Tortuous thoracic aorta. No effusion.  IMPRESSION: Negative abdominal radiographs.  No acute cardiopulmonary disease.   Electronically Signed   By: Oley Balm M.D.   On: 10/28/2012 01:42    EKG Interpretation   None       MDM  No diagnosis found. Improved with GI cocktail.  Recent normal Ct.  Also recently seen by GI and the MD thought patient may have an anxietal component as no cause could be found on labs or exam or CT.  I concur with this.  I will treat for GERD>  Patient should follow up closely with his PMD for ongoing care.  There is no indication today for repeat imaging   Lasonja Lakins K Jyles Sontag-Rasch, MD 10/28/12 [phone]

## 2012-11-06 ENCOUNTER — Other Ambulatory Visit: Payer: Self-pay

## 2012-11-11 ENCOUNTER — Ambulatory Visit (INDEPENDENT_AMBULATORY_CARE_PROVIDER_SITE_OTHER): Payer: BC Managed Care – PPO | Admitting: Internal Medicine

## 2012-11-11 VITALS — BP 140/90 | HR 62 | Temp 98.3°F | Resp 16 | Ht 63.0 in | Wt 157.0 lb

## 2012-11-11 DIAGNOSIS — I10 Essential (primary) hypertension: Secondary | ICD-10-CM

## 2012-11-11 DIAGNOSIS — Z7189 Other specified counseling: Secondary | ICD-10-CM

## 2012-11-11 DIAGNOSIS — IMO0001 Reserved for inherently not codable concepts without codable children: Secondary | ICD-10-CM

## 2012-11-11 MED ORDER — LISINOPRIL 40 MG PO TABS: 40.0000 mg | ORAL_TABLET | Freq: Every day | ORAL | Status: DC

## 2012-11-11 NOTE — Patient Instructions (Signed)
Hipertensin (Hypertension) Cuando el corazn late [employer] sangre a travs de las arterias. La fuerza que se origina es la presin arterial. Si la presin es demasiado elevada, se denomina hipertensin. El peligro radica en que puede sufrirla y no saberlo. Hipertensin puede significar que su corazn debe trabajar ms intensamente para bombear sangre. Las arterias pueden Dietitian o rgidas. El [place] extra [place] el riesgo de enfermedades cardacas, ictus y [employer].  La presin arterial est formada por dos nmeros: el nmero mayor sobre el nmero menor, por ejemplo110/70. Se seala "110/72". Los valores ideales son por debajo de 120 para el nmero ms alto (sistlica) y por debajo de 80 para el ms bajo (diastlica). Anote su presin sangunea hoy. Debe prestar mucha atencin a su presin arterial si sufre alguna otra enfermedad como:  Insuficiencia cardaca  Ataques cardiacos previos  Diabetes  Enfermedad renal crnica  Ictus previo  Mltiples factores de riesgo para enfermedades cardacas Para diagnosticar si usted sufre hipertensin arterial, debe medirse la presin mientras encuentra sentado con el brazo elevado a la altura del nivel del corazn. Debe medirse al menos 2 veces. Una nica lectura de presin arterial elevada (especialmente en el servicio de emergencias) no significa que necesita tratamiento. Hay enfermedades en las que la presin arterial es diferente en ambos brazos. Es importante que consulte rpidamente con su mdico para un control. La Har[employer]son de las personas sufren hipertensin esencial, lo que significa que no tiene una causa especfica. Este tipo de hipertensin puede bajarse modificando algunos factores en el estilo de vida como:  Librarian, academic.  El consumo de cigarrillos.  La falta de actividad fsica.  Peso excesivo  Consumo de drogas y alcohol.  Consumiendo menos sal La mayora de las personas no tienen sntomas hasta que la hipertensin  ocasiona un dao en el organismo. El tratamiento efectivo puede evitar, Designer, industrial/product o reducir ese dao. TRATAMIENTO: El tratamiento para la hipertensin, cuando se ha identificado una causa, est dirigido a la misma Hay un gran nmero en medicamentos para tratarla. Se agrupan en diferentes categoras y Media planner los medicamentos indicados para usted. Muchos medicamentos disponibles tienen efectos secundarios. Debe comentar los efectos secundarios con su mdico. Si la presin arterial permanece elevada despus de modificar su estilo de vida o comenzar a tomar medicamentos:  Los medicamentos deben ser reemplazados  Puede ser necesario evaluar otros problemas.  Debe estar seguro que comprende las indicaciones, que sabe cmo y cundo [employer].  Asegrese de Education officer, environmental un control con su mdico dentro del tiempo indicado (generalmente dentro de las [employer]) para volver a Systems analyst presin arterial y Dentist los medicamentos prescritos.  Si est tomando ms de un medicamento para la presin arterial, asegrese que sabe cmo y en qu momentos debe tomarlos. Tomar los medicamentos al mismo tiempo puede dar como resultado un gran descenso en la presin arterial. SOLICITE ATENCIN MDICA DE INMEDIATO SI PRESENTA:  Dolor de cabeza intenso, visin borrosa o cambios en la visin, o confusin.  Debilidad o adormecimientos inusuales o sensacin de desmayo.  Dolor de pecho o abdominal intenso, vmitos o problemas para respirar. ASEGRESE QUE:   Comprende estas instrucciones.  Controlar su enfermedad.  Solicitar ayuda inmediatamente si no mejora o si empeora. Document Released: 12/18/2004 Document Revised: 03/12/2011 [facility] Patient Information 2014 [place], [place]. La dieta DASH  (DASH Diet) La dieta DASH (por su nombre en ingls) significa "Abordaje diettico para detener la hipertensin". Es un plan de alimentacin saludable que ha demostrado reducir la  presin arterial  elevada (hipertensin) en tan solo [address] mientras probablemente tambin ofrezca otros beneficios significativos para la salud. Estos otros beneficios incluyen la reduccin del riesgo de sufrir cncer de mama despus de la menopausia y de sufrir diabetes tipo 2, enfermedades cardacas, cncer de colon e ictus. Los beneficios para la salud tambin incluyen la prdida de peso y la disminucin del riesgo de insuficiencia renal en pacientes con enfermedad renal crnica.  GUAS PARA LA DIETA   Limite la cantidad de sal (sodio). Su dieta debe contener menos de 1500 mg de [employer].  Limite el consumo de carbohidratos refinados o procesados. Su dieta debe incluir principalmente granos enteros. Consuma postres con moderacin y limite el agregado de International aid/development worker.  Incluya pequeas cantidades de grasas saludables para el corazn. Estos tipos de grasas estn contenidas en las nueces, aceites y [place]. Limite las grasas saturadas y trans. Estas grasas han demostrado ser perjudiciales para el organismo. ELECCIN DE LOS ALIMENTOS  Los siguientes grupos de alimentos se basan en una dieta de 2000 caloras. Consulte a su nutricionista matriculado cules son sus necesidades calricas individuales.  Granos y productos elaborados con granos (6 a 8 porciones por Futures trader).   Consuma ms a menudo: Pan integral, arroz integral, pasta de trigo o de [place] integral, quinoa, palomitas de maz sin grasa o sal aadida (infladas).  Consuma con menos frecuencia:  Pan blanco, pastas blancas, arroz blanco, pan de maz. Verduras (4 a 5 porciones por Futures trader).   Consuma ms a menudo: Hortalizas frescas, congeladas y Primary school teacher. Puede consumir las hortalizas crudas, al vapor, asadas o grilladas con una mnima cantidad de grasas.  Consuma con menos frecuencia o evite: Vegetales con crema o fritos. Verduras en salsa de [place]. Frutas (4 a 5 porciones por da).  Consuma ms a menudo: Frutas frescas, en conserva (en su jugo natural) o  frutas congeladas. Frutas secas sin el agregado de azcar. Cien por ciento jugo de fruta ( taza [237 ml] por da).  Consuma con menos frecuencia: Frutas secas con azcar agregado. Fruta enlatada en almbar light o espeso. Carnes magras, pescado y aves de corral (2 porciones o Cabin crew. Una porcin es de 3 a 4 oz. [16-109 g]).  Consuma ms a menudo: Carne molida 90% magra o ms de lomo o solomillo . Cortes redondos de carne, [place] de pollo y de [place]. Todos los pescados. Prepare la carne al horno o asada a la parrilla o al grill. Nada debe ser frito.  Consuma con menos frecuencia o evite: Cortes grasos de carne, [place] o pata, muslo o ala de pollo. Cortes de carne o pescado fritos. Lcteos (2 a 3 porciones)  Consuma ms a menudo: La [employer] o con bajo contenido de grasa, yogur descremado o semidescremado, queso con bajo contenido en grasa o parcialmente descremado.  Consuma con menos frecuencia o evite: Leche (entera, al 2%). Yogur de [employer]. Quesos con toda su grasa. Nueces, semillas y legumbres (4 a 5 porciones por semana).  Consuma ms a menudo: Todos los alimentos sin sal agregada.  Consuma con menos frecuencia o evite: Nueces y semillas con sal, frijoles enlatados con sal agregada. Grasas y dulces (limitados)  Consuma ms a menudo: Public affairs consultant, margarinas en pote sin grasas trans, gelatina sin azcar. Mayonesa y aderezos para Solicitor.  Consuma con menos frecuencia o evite: Aceites de coco, aceites de palma, [place], margarina en barra, crema, [employer] y [address], [place], [place], [place]. Irven Shelling MS INFORMACIN  [place]  Diet Eating Plan (Plan Nutricional Dieta Dash): www.dashdiet.org  Document Released: 12/07/2010 Document Revised: 03/12/2011 [facility] Patient Information 2014 [place], [place]. DASH Diet The DASH diet stands for "Dietary Approaches to Stop Hypertension." It is a healthy eating plan that has been shown to reduce high blood  pressure (hypertension) in as little as 14 days, while also possibly providing other significant health benefits. These other health benefits include reducing the risk of breast cancer after menopause and reducing the risk of type 2 diabetes, heart disease, colon cancer, and stroke. Health benefits also include weight loss and slowing kidney failure in patients with chronic kidney disease.  DIET GUIDELINES  Limit salt (sodium). Your diet should contain less than 1500 mg of sodium daily.  Limit refined or processed carbohydrates. Your diet should include mostly whole grains. Desserts and added sugars should be used sparingly.  Include small amounts of heart-healthy fats. These types of fats include nuts, oils, and tub margarine. Limit saturated and trans fats. These fats have been shown to be harmful in the body. CHOOSING FOODS  The following food groups are based on a 2000 calorie diet. See your Registered Dietitian for individual calorie needs. Grains and Grain Products (6 to 8 servings daily)  Eat More Often: Whole-wheat bread, brown rice, whole-grain or wheat pasta, quinoa, popcorn without added fat or salt (air popped).  Eat Less Often: White bread, white pasta, white rice, cornbread. Vegetables (4 to 5 servings daily)  Eat More Often: Fresh, frozen, and canned vegetables. Vegetables may be raw, steamed, roasted, or grilled with a minimal amount of fat.  Eat Less Often/Avoid: Creamed or fried vegetables. Vegetables in a cheese sauce. Fruit (4 to 5 servings daily)  Eat More Often: All fresh, canned (in natural juice), or frozen fruits. Dried fruits without added sugar. One hundred percent fruit juice ( cup [237 mL] daily).  Eat Less Often: Dried fruits with added sugar. Canned fruit in light or heavy syrup. [employer], Fish, and Poultry (2 servings or less daily. One serving is 3 to 4 oz [85-114 g]).  Eat More Often: Ninety percent or leaner ground beef, tenderloin, sirloin. Round cuts  of beef, chicken breast, [place] breast. All fish. Grill, bake, or broil your meat. Nothing should be fried.  Eat Less Often/Avoid: Fatty cuts of meat, [place], or chicken leg, thigh, or wing. Fried cuts of meat or fish. Dairy (2 to 3 servings)  Eat More Often: Low-fat or fat-free milk, low-fat plain or light yogurt, reduced-fat or part-skim cheese.  Eat Less Often/Avoid: Milk (whole, 2%).Whole milk yogurt. Full-fat cheeses. Nuts, Seeds, and Legumes (4 to 5 servings per week)  Eat More Often: All without added salt.  Eat Less Often/Avoid: Salted nuts and seeds, canned beans with added salt. Fats and Sweets (limited)  Eat More Often: Vegetable oils, tub margarines without trans fats, sugar-free gelatin. Mayonnaise and salad dressings.  Eat Less Often/Avoid: Coconut oils, palm oils, butter, stick margarine, cream, half and half, cookies, candy, pie. FOR MORE INFORMATION The Dash Diet Eating Plan: www.dashdiet.org Document Released: 12/07/2010 Document Revised: 03/12/2011 Document Reviewed: 12/07/2010 [facility] Patient Information 2014 [place], [place].

## 2012-11-11 NOTE — Progress Notes (Signed)
  Subjective:    Patient ID: Ethan Silva, male    DOB: 10-02-1965, 47 y.o.   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 HTN controlled with lisinopril   Review of Systems     Objective:   Physical Exam  Vitals reviewed. Constitutional: He is oriented to person, place, and time. He appears well-developed and well-nourished. No distress.  HENT:  Head: Normocephalic.  Eyes: EOM are normal.  Cardiovascular: Normal rate, regular rhythm and normal heart sounds.   Pulmonary/Chest: Effort normal and breath sounds normal.  Abdominal: There is tenderness. There is no rebound and no guarding.  Neurological: He is alert and oriented to person, place, and time. He exhibits normal muscle tone. Coordination normal.  Psychiatric: He has a normal mood and affect.          Assessment & Plan:  RF meds

## 2012-11-12 ENCOUNTER — Other Ambulatory Visit: Payer: Self-pay | Admitting: Gastroenterology

## 2012-11-12 ENCOUNTER — Telehealth: Payer: Self-pay | Admitting: Gastroenterology

## 2012-11-12 MED ORDER — SUCRALFATE 1 GM/10ML PO SUSP: 1.0000 g | Freq: Four times a day (QID) | ORAL | Status: DC

## 2012-11-12 NOTE — Telephone Encounter (Signed)
Can this patient have this prescription

## 2012-11-12 NOTE — Telephone Encounter (Signed)
Carafate sent to pharmacy  Xanax sent to Dr Arlyce Dice for approval

## 2012-11-12 NOTE — Telephone Encounter (Signed)
Xanax faxed to pharmacy.

## 2012-11-14 ENCOUNTER — Encounter: Payer: Self-pay | Admitting: Gastroenterology

## 2012-11-14 ENCOUNTER — Ambulatory Visit (INDEPENDENT_AMBULATORY_CARE_PROVIDER_SITE_OTHER): Payer: BC Managed Care – PPO | Admitting: Gastroenterology

## 2012-11-14 VITALS — BP 128/90 | HR 76 | Ht 62.0 in | Wt 158.5 lb

## 2012-11-14 DIAGNOSIS — R1011 Right upper quadrant pain: Secondary | ICD-10-CM

## 2012-11-14 MED ORDER — NA SULFATE-K SULFATE-MG SULF 17.5-3.13-1.6 GM/177ML PO SOLN
1.0000 | Freq: Once | ORAL | Status: DC
Start: 2012-11-14 — End: 2012-11-16

## 2012-11-14 NOTE — Progress Notes (Signed)
History of Present Illness:  Mr. Felipa Furnace continues to complain of periumbilical pain, now more on the left than the right.  He is constipated.  With Carafate he develops burning up into his chest.  When he moves his bowels the left-sided discomfort decreases.  He denies rectal bleeding or melena.    Review of Systems: Pertinent positive and negative review of systems were noted in the above HPI section. All other review of systems were otherwise negative.    Current Medications, Allergies, Past Medical History, Past Surgical History, Family History and Social History were reviewed in [employer] electronic medical record  Vital signs were reviewed in today's medical record. Physical Exam: General: Well developed , well nourished, no acute distress There is minimal tenderness in the left and) focal areas without guarding or rebound.  There are no obvious masses organomegaly

## 2012-11-14 NOTE — Patient Instructions (Signed)

## 2012-11-14 NOTE — Addendum Note (Signed)
Addended by: Marlowe Kays on: 11/14/2012 11:04 AM   Modules accepted: Orders

## 2012-11-14 NOTE — Assessment & Plan Note (Addendum)
Pain is now periumbilical rather than right upper quadrant and encompasses both the right and left periumbilical areas.  Seems to improve with bowel movements.  It could be related to constipation.  Recommendations #1 discontinue Carafate, Xanax and dicyclomine #2 colonoscopy #3 reevaluate for pain if colonoscopy is negative and he starts moving his bowels more regularly

## 2012-11-16 ENCOUNTER — Emergency Department (HOSPITAL_COMMUNITY)
Admission: EM | Admit: 2012-11-16 | Discharge: 2012-11-16 | Disposition: A | Discharge status: Home / Self Care | Payer: BC Managed Care – PPO | Attending: Emergency Medicine | Admitting: Emergency Medicine

## 2012-11-16 ENCOUNTER — Encounter (HOSPITAL_COMMUNITY): Payer: Self-pay | Admitting: Emergency Medicine

## 2012-11-16 DIAGNOSIS — R1013 Epigastric pain: Secondary | ICD-10-CM | POA: Insufficient documentation

## 2012-11-16 DIAGNOSIS — Z8719 Personal history of other diseases of the digestive system: Secondary | ICD-10-CM | POA: Insufficient documentation

## 2012-11-16 DIAGNOSIS — Z8639 Personal history of other endocrine, nutritional and metabolic disease: Secondary | ICD-10-CM | POA: Insufficient documentation

## 2012-11-16 DIAGNOSIS — I10 Essential (primary) hypertension: Secondary | ICD-10-CM | POA: Insufficient documentation

## 2012-11-16 DIAGNOSIS — Z79899 Other long term (current) drug therapy: Secondary | ICD-10-CM | POA: Insufficient documentation

## 2012-11-16 DIAGNOSIS — R110 Nausea: Secondary | ICD-10-CM | POA: Insufficient documentation

## 2012-11-16 DIAGNOSIS — G8928 Other chronic postprocedural pain: Secondary | ICD-10-CM | POA: Insufficient documentation

## 2012-11-16 DIAGNOSIS — Z862 Personal history of diseases of the blood and blood-forming organs and certain disorders involving the immune mechanism: Secondary | ICD-10-CM | POA: Insufficient documentation

## 2012-11-16 LAB — COMPREHENSIVE METABOLIC PANEL
ALT: 29 U/L (ref 0–53)
AST: 21 U/L (ref 0–37)
Albumin: 4.3 g/dL (ref 3.5–5.2)
Alkaline Phosphatase: 58 U/L (ref 39–117)
BUN: 11 mg/dL (ref 6–23)
CO2: 27 mEq/L (ref 19–32)
Calcium: 9.6 mg/dL (ref 8.4–10.5)
Chloride: 98 mEq/L (ref 96–112)
Creatinine, Ser: 0.88 mg/dL (ref 0.50–1.35)
GFR calc Af Amer: >90 mL/min (ref >90)
GFR calc non Af Amer: >90 mL/min (ref >90)
Glucose, Bld: 111 mg/dL — ABNORMAL HIGH (ref 70–99)
Sodium: 135 mEq/L (ref 135–145)
Total Bilirubin: 0.4 mg/dL (ref 0.3–1.2)
Total Protein: 7.8 g/dL (ref 6.0–8.3)

## 2012-11-16 LAB — CBC
HCT: 48.2 % (ref 39.0–52.0)
Hemoglobin: 17.6 g/dL — ABNORMAL HIGH (ref 13.0–17.0)
MCH: 31.5 pg (ref 26.0–34.0)
MCHC: 36.5 g/dL — ABNORMAL HIGH (ref 30.0–36.0)
MCV: 86.4 fL (ref 78.0–100.0)
Platelets: 223 10*3/uL (ref 150–400)
RBC: 5.58 MIL/uL (ref 4.22–5.81)
RDW: 13 % (ref 11.5–15.5)
WBC: 7 10*3/uL (ref 4.0–10.5)

## 2012-11-16 LAB — LIPASE, BLOOD: Lipase: 26 U/L (ref 11–59)

## 2012-11-16 MED ORDER — ONDANSETRON 4 MG PO TBDP
4.0000 mg | ORAL_TABLET | Freq: Once | ORAL | Status: AC
  Administered 2012-11-16: 4 mg via ORAL
  Filled 2012-11-16: qty 1

## 2012-11-16 MED ORDER — SODIUM CHLORIDE 0.9 % IV BOLUS (SEPSIS)
1000.0000 mL | Freq: Once | INTRAVENOUS | Status: AC
  Administered 2012-11-16: 1000 mL via INTRAVENOUS

## 2012-11-16 MED ORDER — MORPHINE SULFATE 4 MG/ML IJ SOLN
4.0000 mg | Freq: Once | INTRAMUSCULAR | Status: AC
  Administered 2012-11-16: 4 mg via INTRAVENOUS
  Filled 2012-11-16: qty 1

## 2012-11-16 MED ORDER — GI COCKTAIL ~~LOC~~
30.0000 mL | Freq: Once | ORAL | Status: AC
  Administered 2012-11-16: 30 mL via ORAL
  Filled 2012-11-16: qty 30

## 2012-11-16 NOTE — ED Notes (Signed)
MD at bedside. 

## 2012-11-16 NOTE — ED Notes (Signed)
Pt reports abd pain and nausea since yesterday. Pt reports that he feels like he needs to vomit but can not. States the pain is epigastric. No SOB or diaphoresis with the pain.

## 2012-11-16 NOTE — ED Provider Notes (Signed)
CSN: [id number]     Arrival date & time 11/16/12  4098 History   First MD Initiated Contact with Patient 11/16/12 0720     Chief Complaint  Patient presents with  . Abdominal Pain   (Consider location/radiation/quality/duration/timing/severity/associated sxs/prior Treatment) Patient is a 47 y.o. male presenting with abdominal pain.  Abdominal Pain  Pt with history of cholecystectomy and followup ERCP for bile leak had stent placed in July has had chronic epigastric abdominal pain since that time. He reprots onset of severe aching epigastric pain yesterday associated with nausea but no vomiting and no diarrhea/constipation. He was seen in GI clinic 2 days ago for similar and at that time reported pain improved with bowel movements. He was unable to tolerate carafate he had been prescribed the week before and so that was stopped as well. He was scheduled for colonoscopy.   Past Medical History  Diagnosis Date  . Hyperlipidemia   . Hypertension   . Numbness and tingling in hands     Hx: of  . Headache(784.0)     Hx: of when BP is elevated  . Bile duct leak    Past Surgical History  Procedure Laterality Date  . Cholecystectomy N/A 07/11/2012    Procedure: LAPAROSCOPIC CHOLECYSTECTOMY WITH INTRAOPERATIVE CHOLANGIOGRAM;  Surgeon: Axel Filler, MD;  Location: [facility] OR;  Service: General;  Laterality: N/A;  . Ercp  07/16/2012  . Ercp N/A 07/16/2012    Procedure: ENDOSCOPIC RETROGRADE CHOLANGIOPANCREATOGRAPHY (ERCP);  Surgeon: Louis Meckel, MD;  Location: [facility] OR;  Service: Gastroenterology;  Laterality: N/A;  . Ercp N/A 10/06/2012    Procedure: ENDOSCOPIC RETROGRADE CHOLANGIOPANCREATOGRAPHY (ERCP);  Surgeon: Louis Meckel, MD;  Location: [facility];  Service: Endoscopy;  Laterality: N/A;   Family History  Problem Relation Age of Onset  . Gallstones Mother   . Diabetes Paternal Uncle    History  Substance Use Topics  . Smoking status: Never Smoker   . Smokeless tobacco: Never Used   . Alcohol Use: No    Review of Systems  Gastrointestinal: Positive for abdominal pain.   All other systems reviewed and are negative except as noted in HPI.   Allergies  Review of patient's allergies indicates no known allergies.  Home Medications   Current Outpatient Rx  Name  Route  Sig  Dispense  Refill  . ALPRAZolam (XANAX) 0.5 MG tablet   Oral   Take 0.5 mg by mouth 3 (three) times daily as needed for anxiety.         . dicyclomine (BENTYL) 10 MG capsule   Oral   Take 10 mg by mouth 4 (four) times daily -  before meals and at bedtime.         Marland Kitchen HYDROcodone-acetaminophen (NORCO/VICODIN) 5-325 MG per tablet   Oral   Take 1 tablet by mouth every 4 (four) hours as needed for moderate pain.         Marland Kitchen lisinopril (PRINIVIL,ZESTRIL) 40 MG tablet   Oral   Take 40 mg by mouth daily.         . sucralfate (CARAFATE) 1 GM/10ML suspension   Oral   Take by mouth 4 (four) times daily.          BP 147/90  Pulse 75  Temp(Src) 99.2 F (37.3 C) (Oral)  Resp 18  SpO2 96% Physical Exam  Nursing note and vitals reviewed. Constitutional: He is oriented to person, place, and time. He appears well-developed and well-nourished.  HENT:  Head: Normocephalic and atraumatic.  Eyes: EOM are normal. Pupils are equal, round, and reactive to light.  Neck: Normal range of motion. Neck supple.  Cardiovascular: Normal rate, normal heart sounds and intact distal pulses.   Pulmonary/Chest: Effort normal and breath sounds normal.  Abdominal: Bowel sounds are normal. He exhibits no distension. There is tenderness (epigastric, mild, no peritoneal signs). There is no rebound and no guarding.  Musculoskeletal: Normal range of motion. He exhibits no edema and no tenderness.  Neurological: He is alert and oriented to person, place, and time. He has normal strength. No cranial nerve deficit or sensory deficit.  Skin: Skin is warm and dry. No rash noted.  Psychiatric: He has a normal mood and  affect.    ED Course  Procedures (including critical care time) Labs Review Labs Reviewed  CBC - Abnormal; Notable for the following:    Hemoglobin 17.6 (*)    MCHC 36.5 (*)    All other components within normal limits  COMPREHENSIVE METABOLIC PANEL - Abnormal; Notable for the following:    Glucose, Bld 111 (*)    All other components within normal limits  LIPASE, BLOOD   Imaging Review No results found.  EKG Interpretation   None       MDM   1. Epigastric pain     Pt with numerous ED, PCP and GI visits for similar abdominal pain over the last several weeks with extensive ED workup including CT, labs and pain meds. He had another follow up ERCP about a month ago to remove stent and had a retained stone at that time. Will check labs, give Zofran/GI cocktail and reassess.   9:52 AM Labs unremarkable. Pain improved. Advised to take Maalox and followup with PCP/GI for any further problems.   Charles B. Bernette Mayers, MD 11/16/12 [phone]

## 2012-11-17 ENCOUNTER — Telehealth: Payer: Self-pay | Admitting: Gastroenterology

## 2012-11-17 ENCOUNTER — Encounter: Payer: Self-pay | Admitting: Gastroenterology

## 2012-11-17 NOTE — Telephone Encounter (Signed)
Dr Kaplan please advise 

## 2012-11-17 NOTE — Telephone Encounter (Signed)
Ok

## 2012-11-18 MED ORDER — HYDROCODONE-ACETAMINOPHEN 5-325 MG PO TABS: 1.0000 | ORAL_TABLET | ORAL | Status: DC | PRN

## 2012-11-18 NOTE — Telephone Encounter (Signed)
Patient picking up printed rx today

## 2012-11-20 ENCOUNTER — Ambulatory Visit (INDEPENDENT_AMBULATORY_CARE_PROVIDER_SITE_OTHER): Payer: BC Managed Care – PPO | Admitting: Nurse Practitioner

## 2012-11-20 ENCOUNTER — Encounter: Payer: Self-pay | Admitting: Nurse Practitioner

## 2012-11-20 VITALS — BP 142/92 | HR 86 | Ht 62.0 in | Wt 153.2 lb

## 2012-11-20 DIAGNOSIS — R109 Unspecified abdominal pain: Secondary | ICD-10-CM

## 2012-11-20 MED ORDER — METHOCARBAMOL 500 MG PO TABS: 500.0000 mg | ORAL_TABLET | Freq: Two times a day (BID) | ORAL | Status: DC

## 2012-11-20 MED ORDER — SULINDAC 200 MG PO TABS: 200.0000 mg | ORAL_TABLET | Freq: Every day | ORAL | Status: DC

## 2012-11-20 NOTE — Patient Instructions (Addendum)
We have sent  medications to your pharmacy for you to pick up at your convenience. DO NOT drink alcohol while taking this medication. Stop taking Vicodin. Call us in 2 weeks if no better.

## 2012-11-21 ENCOUNTER — Encounter: Payer: Self-pay | Admitting: Nurse Practitioner

## 2012-11-21 DIAGNOSIS — R109 Unspecified abdominal pain: Secondary | ICD-10-CM | POA: Insufficient documentation

## 2012-11-21 NOTE — Progress Notes (Signed)
     History of Present Illness:  Patient is a 47 year old spanish speaking male, here with an interpreter, for evaluation of abdominal pain. In July of this year patient underwent a lar cholecystectomy for symptomatic cholelithiasis. Surgery was complicated by a bile leak requiring ERCP with stent placement 07/16/12 by Dr. Arlyce Dice. On 10/06/12 he had an ERCP with sphincterotomy /papillotomy/removal of stones and removal of the biliary stent. Since then patient has had multiple visits to ED and our office ( as outlined below) with negative workup.   ED 10/8. Abdominal pain. Labs were normal. Patient discharged.   ED 10/10. Left upper quadrant pain. He was treated with a GI cocktail and asked to followup with [place].   ED 10/11 abdominal pain and chills. Temp was normal. Labs normal. Patient discharged.   ED 10/12 abdominal pain. Stable, Discharged.   Office visit 10/13. Patient P.A's in the office  CTscan was ordered and negative  Office visit 10/17/12. Patient saw Dr. Arlyce Dice for abdominal pain.   ED 10/28 with vomiting. Treated for GERD  11/14 office visit. Patient saw Dr. Arlyce Dice  11/16 ED. Abdominal pain. Negative workup.   Patient describes upper abdominal pain present since cholecystectomy. He has no pain when lying completely still. It mainly hurts to twist or lay on his sides. Deep inspiration causes the same epigastric pain.   Current Medications, Allergies, Past Medical History, Past Surgical History, Family History and Social History were reviewed in [employer] record.   Physical Exam: General: Pleasant, well developed , hispanic male in no acute distress Head: Normocephalic and atraumatic Eyes:  sclerae anicteric, conjunctiva pink  Ears: Normal auditory acuity Lungs: Clear throughout to auscultation Heart: Regular rate and rhythm Abdomen: Soft, non distended, mild mid upper abdominal tenderness. Negative Carnett's sign.  non-tender. No masses, no  hepatomegaly. Normal bowel sounds Musculoskeletal: Symmetrical with no gross deformities  Extremities: No edema  Neurological: Alert oriented x 4, grossly nonfocal Psychological:  Alert and cooperative. Normal mood and affect  Assessment and Recommendations:  47 year old male with mid upper abdominal pain since cholecystectomy / bile duct leak in July. He has had numerous ED visits and office visits with [place] with negative workup including labs and CTscan. His description of pain is c/w musculoskeletal origin vrs neuropathic vrs adhesions. Pain only present when he lays or moves certain ways. I advised him to discontinue narcotics. Will try Clinoril and a muscle relaxer over next 10 days.

## 2012-11-24 NOTE — Progress Notes (Signed)
Reviewed and agree with management. Robert D. Kaplan, M.D., FACG  

## 2012-12-15 ENCOUNTER — Ambulatory Visit (INDEPENDENT_AMBULATORY_CARE_PROVIDER_SITE_OTHER): Payer: BC Managed Care – PPO | Admitting: Emergency Medicine

## 2012-12-15 VITALS — BP 136/92 | HR 66 | Temp 98.1°F | Resp 14 | Ht 62.0 in | Wt 155.0 lb

## 2012-12-15 DIAGNOSIS — I10 Essential (primary) hypertension: Secondary | ICD-10-CM

## 2012-12-15 DIAGNOSIS — R7309 Other abnormal glucose: Secondary | ICD-10-CM

## 2012-12-15 DIAGNOSIS — R300 Dysuria: Secondary | ICD-10-CM

## 2012-12-15 LAB — POCT CBC
Granulocyte percent: 55.5 %G (ref 37–80)
HCT, POC: 49.2 % (ref 43.5–53.7)
Hemoglobin: 16 g/dL (ref 14.1–18.1)
Lymph, poc: 2.5 (ref 0.6–3.4)
MCH, POC: 30.3 pg (ref 27–31.2)
MCHC: 32.5 g/dL (ref 31.8–35.4)
MCV: 93.2 fL (ref 80–97)
MID (cbc): 0.4 (ref 0–0.9)
MPV: 8.3 fL (ref 0–99.8)
POC Granulocyte: 3.7 (ref 2–6.9)
POC LYMPH PERCENT: 38.5 %L (ref 10–50)
Platelet Count, POC: 235 10*3/uL (ref 142–424)
RDW, POC: 13.9 %
WBC: 6.6 10*3/uL (ref 4.6–10.2)

## 2012-12-15 LAB — POCT URINALYSIS DIPSTICK
Bilirubin, UA: NEGATIVE
Glucose, UA: NEGATIVE
Ketones, UA: NEGATIVE
Leukocytes, UA: NEGATIVE
Nitrite, UA: NEGATIVE
Spec Grav, UA: 1.02
Urobilinogen, UA: 0.2
pH, UA: 7

## 2012-12-15 LAB — POCT UA - MICROSCOPIC ONLY
Casts, Ur, LPF, POC: NEGATIVE
Crystals, Ur, HPF, POC: NEGATIVE
Yeast, UA: NEGATIVE

## 2012-12-15 LAB — POCT GLYCOSYLATED HEMOGLOBIN (HGB A1C): Hemoglobin A1C: 5.3

## 2012-12-15 MED ORDER — LISINOPRIL 40 MG PO TABS: 40.0000 mg | ORAL_TABLET | Freq: Every day | ORAL | Status: DC

## 2012-12-15 NOTE — Progress Notes (Signed)
[facility] [address] [place] [address] [phone]  Date:  12/15/2012   Name:  Ethan Silva   DOB:  [birth year]   MRN:  [id number]  PCP:  No PCP Per Patient    Chief Complaint: Medication Refill and Abdominal Pain   History of Present Illness:  Ethan Silva is a 47 y.o. very pleasant male patient who presents with the following:  Comes to the office with history of hypertension.  Needs a refill on his medication.  No adverse effects of medication and no evidence end organ injury.  Recent CMP but no lipids.  Over past 5 months has elevated BS but no HBA1C. Has some dysuria and LLQ discomfort when he urinates.  No discharge.  No fever or chills.  No nausea or vomiting.  No stool change.  No rash, cough, coryza.  Patient Active Problem List   Diagnosis Date Noted  . Abdominal  pain, other specified site 11/21/2012  . Abdominal pain, right upper quadrant 10/13/2012  . HTN (hypertension) 10/11/2012  . Calculus of bile duct without mention of cholecystitis or obstruction 10/06/2012  . Common bile duct leak 07/16/2012    Past Medical History  Diagnosis Date  . Hyperlipidemia   . Hypertension   . Numbness and tingling in hands     Hx: of  . Headache(784.0)     Hx: of when BP is elevated  . Bile duct leak     Past Surgical History  Procedure Laterality Date  . Cholecystectomy N/A 07/11/2012    Procedure: LAPAROSCOPIC CHOLECYSTECTOMY WITH INTRAOPERATIVE CHOLANGIOGRAM;  Surgeon: Axel Filler, MD;  Location: [facility] OR;  Service: General;  Laterality: N/A;  . Ercp  07/16/2012  . Ercp N/A 07/16/2012    Procedure: ENDOSCOPIC RETROGRADE CHOLANGIOPANCREATOGRAPHY (ERCP);  Surgeon: Louis Meckel, MD;  [facility] OR;  Service: Gastroenterology;  Laterality: N/A;  . Ercp N/A 10/06/2012    Procedure: ENDOSCOPIC RETROGRADE CHOLANGIOPANCREATOGRAPHY (ERCP);  Surgeon: Louis Meckel, MD;  [facility];  Service: Endoscopy;  Laterality: N/A;    History   Substance Use Topics  . Smoking status: Never Smoker   . Smokeless tobacco: Never Used  . Alcohol Use: No    Family History  Problem Relation Age of Onset  . Gallstones Mother   . Diabetes Paternal Uncle     No Known Allergies  Medication list has been reviewed and updated.  Current Outpatient Prescriptions on File Prior to Visit  Medication Sig Dispense Refill  . lisinopril (PRINIVIL,ZESTRIL) 40 MG tablet Take 40 mg by mouth daily.      Marland Kitchen HYDROcodone-acetaminophen (NORCO/VICODIN) 5-325 MG per tablet Take 1 tablet by mouth every 6 (six) hours as needed for moderate pain.      . methocarbamol (ROBAXIN) 500 MG tablet Take 1 tablet (500 mg total) by mouth 2 (two) times daily.  20 tablet  0  . sulindac (CLINORIL) 200 MG tablet Take 1 tablet (200 mg total) by mouth daily.  10 tablet  0   No current facility-administered medications on file prior to visit.    Review of Systems:  As per HPI, otherwise negative.    Physical Examination: Filed Vitals:   12/15/12 1904  BP: 136/92  Pulse: 66  Temp: 98.1 F (36.7 C)  Resp: 14   Filed Vitals:   12/15/12 1904  Height: 5\' 2"  (1.575 m)  Weight: 155 lb (70.308 kg)   Body mass index is 28.34 kg/(m^2). Ideal Body Weight: Weight in (lb)  to have BMI = 25: 136.4  GEN: WDWN, NAD, Non-toxic, A & O x 3 HEENT: Atraumatic, Normocephalic. Neck supple. No masses, No LAD. Ears and Nose: No external deformity. CV: RRR, No M/G/R. No JVD. No thrill. No extra heart sounds. PULM: CTA B, no wheezes, crackles, rhonchi. No retractions. No resp. distress. No accessory muscle use. ABD: S, NT, ND, +BS. No rebound. No HSM. EXTR: No c/c/e NEURO Normal gait.  PSYCH: Normally interactive. Conversant. Not depressed or anxious appearing.  Calm demeanor.  GENITALIA:  Normal non circumcised.  No hernia  Assessment and Plan: Hypertension Refills Dysuria Labs Abdominal pain  Signed,  Phillips Odor, MD   Results for orders placed in visit on  12/15/12  POCT GLYCOSYLATED HEMOGLOBIN (HGB A1C)      Result Value Range   Hemoglobin A1C 5.3    POCT UA - MICROSCOPIC ONLY      Result Value Range   WBC, Ur, HPF, POC 0-1     RBC, urine, microscopic 0-2     Bacteria, U Microscopic trace     Mucus, UA trace     Epithelial cells, urine per micros 0-1     Crystals, Ur, HPF, POC neg     Casts, Ur, LPF, POC neg     Yeast, UA neg    POCT URINALYSIS DIPSTICK      Result Value Range   Color, UA yellow     Clarity, UA clear     Glucose, UA neg     Bilirubin, UA neg     Ketones, UA neg     Spec Grav, UA 1.020     Blood, UA trace     pH, UA 7.0     Protein, UA neg     Urobilinogen, UA 0.2     Nitrite, UA neg     Leukocytes, UA Negative    POCT CBC      Result Value Range   WBC 6.6  4.6 - 10.2 K/uL   Lymph, poc 2.5  0.6 - 3.4   POC LYMPH PERCENT 38.5  10 - 50 %L   MID (cbc) 0.4  0 - 0.9   POC MID % 6.0  0 - 12 %M   POC Granulocyte 3.7  2 - 6.9   Granulocyte percent 55.5  37 - 80 %G   RBC 5.28  4.69 - 6.13 M/uL   Hemoglobin 16.0  14.1 - 18.1 g/dL   HCT, POC 16.1  09.6 - 53.7 %   MCV 93.2  80 - 97 fL   MCH, POC 30.3  27 - 31.2 pg   MCHC 32.5  31.8 - 35.4 g/dL   RDW, POC 04.5     Platelet Count, POC 235  142 - 424 K/uL   MPV 8.3  0 - 99.8 fL

## 2012-12-15 NOTE — Patient Instructions (Signed)
Hipertensión  (Hypertension)  Cuando el corazón late bombea la sangre a través de las arterias. La fuerza que se origina es la presión arterial. Si la presión es demasiado elevada, se denomina hipertensión. El peligro radica en que puede sufrirla y no saberlo. Hipertensión puede significar que su corazón debe trabajar más intensamente para bombear sangre. Las arterias pueden estar estrechas o rígidas. El trabajo extra aumenta el riesgo de enfermedades cardíacas, ictus y otros problemas.   La presión arterial está formada por dos números: el número mayor sobre el número menor, por ejemplo110/70. Se señala "110/72". Los valores ideales son por debajo de 120 para el número más alto (sistólica) y por debajo de 80 para el más bajo (diastólica). Anote su presión sanguínea hoy.  Debe prestar mucha atención a su presión arterial si sufre alguna otra enfermedad como:  · Insuficiencia cardíaca  · Ataques cardiacos previos  · Diabetes  · Enfermedad renal crónica  · Ictus previo  · Múltiples factores de riesgo para enfermedades cardíacas  Para diagnosticar si usted sufre hipertensión arterial, debe medirse la presión mientras encuentra sentado con el brazo elevado a la altura del nivel del corazón. Debe medirse al menos 2 veces. Una única lectura de presión arterial elevada (especialmente en el servicio de emergencias) no significa que necesita tratamiento. Hay enfermedades en las que la presión arterial es diferente en ambos brazos. Es importante que consulte rápidamente con su médico para un control.  La mayoría de las personas sufren hipertensión esencial, lo que significa que no tiene una causa específica. Este tipo de hipertensión puede bajarse modificando algunos factores en el estilo de vida como:  · Estrés.  · El consumo de cigarrillos.  · La falta de actividad física.  · Peso excesivo  · Consumo de drogas y alcohol.  · Consumiendo menos sal  La mayoría de las personas no tienen síntomas hasta que la hipertensión  ocasiona un daño en el organismo. El tratamiento efectivo puede evitar, demorar o reducir ese daño.  TRATAMIENTO:  El tratamiento para la hipertensión, cuando se ha identificado una causa, está dirigido a la misma Hay un gran número en medicamentos para tratarla. Se agrupan en diferentes categorías y el médico seleccionará los medicamentos indicados para usted. Muchos medicamentos disponibles tienen efectos secundarios. Debe comentar los efectos secundarios con su médico.  Si la presión arterial permanece elevada después de modificar su estilo de vida o comenzar a tomar medicamentos:  · Los medicamentos deben ser reemplazados  · Puede ser necesario evaluar otros problemas.  · Debe estar seguro que comprende las indicaciones, que sabe cómo y cuándo tomar los medicamentos.  · Asegúrese de realizar un control con su médico dentro del tiempo indicado (generalmente dentro de las dos semanas) para volver a evaluar la presión arterial y revisar los medicamentos prescritos.  · Si está tomando más de un medicamento para la presión arterial, asegúrese que sabe cómo y en qué momentos debe tomarlos. Tomar los medicamentos al mismo tiempo puede dar como resultado un gran descenso en la presión arterial.  SOLICITE ATENCIÓN MÉDICA DE INMEDIATO SI PRESENTA:  · Dolor de cabeza intenso, visión borrosa o cambios en la visión, o confusión.  · Debilidad o adormecimientos inusuales o sensación de desmayo.  · Dolor de pecho o abdominal intenso, vómitos o problemas para respirar.  ASEGÚRESE QUE:   · Comprende estas instrucciones.  · Controlará su enfermedad.  · Solicitará ayuda inmediatamente si no mejora o si empeora.  Document Released: 12/18/2004 Document Revised: 03/12/2011  Exit[employer]®   Patient Information ©2014 [employer].

## 2012-12-17 LAB — GC/CHLAMYDIA PROBE AMP
CT Probe RNA: NEGATIVE
GC Probe RNA: NEGATIVE

## 2012-12-18 ENCOUNTER — Telehealth: Payer: Self-pay

## 2012-12-18 NOTE — Telephone Encounter (Signed)
Patient's son called regarding his Dad's lab results. Please return the call at [phone]. Thank you.

## 2012-12-19 NOTE — Telephone Encounter (Signed)
This patient has not been seen here, is this in correct chart?

## 2013-01-07 ENCOUNTER — Telehealth: Payer: Self-pay | Admitting: Gastroenterology

## 2013-01-07 NOTE — Telephone Encounter (Signed)
Pt had a piece of bread and some milk at 6am. Discussed with pt that he should have nothing else to eat and only clear liquids. Pt asked about prep and wanted to know what to mix it with. Instructed pt to mix the prep with water.

## 2013-01-08 ENCOUNTER — Ambulatory Visit (AMBULATORY_SURGERY_CENTER): Payer: BC Managed Care – PPO | Admitting: Gastroenterology

## 2013-01-08 ENCOUNTER — Encounter: Payer: Self-pay | Admitting: Gastroenterology

## 2013-01-08 VITALS — BP 147/91 | HR 63 | Temp 97.1°F | Resp 20 | Ht 62.0 in | Wt 158.0 lb

## 2013-01-08 DIAGNOSIS — R1011 Right upper quadrant pain: Secondary | ICD-10-CM

## 2013-01-08 MED ORDER — SODIUM CHLORIDE 0.9 % IV SOLN: 500.0000 mL | INTRAVENOUS | Status: DC

## 2013-01-08 NOTE — Procedures (Signed)
°

## 2013-01-08 NOTE — Patient Instructions (Signed)
YOU HAD AN ENDOSCOPIC PROCEDURE TODAY AT THE [facility]: Refer to the procedure report that was given to you for any specific questions about what was found during the examination.  If the procedure report does not answer your questions, please call your gastroenterologist to clarify.  If you requested that your care partner not be given the details of your procedure findings, then the procedure report has been included in a sealed envelope for you to review at your convenience later.  YOU SHOULD EXPECT: Some feelings of bloating in the abdomen. Passage of more gas than usual.  Walking can help get rid of the air that was put into your GI tract during the procedure and reduce the bloating. If you had a lower endoscopy (such as a colonoscopy or flexible sigmoidoscopy) you may notice spotting of blood in your stool or on the toilet paper. If you underwent a bowel prep for your procedure, then you may not have a normal bowel movement for a few days.  DIET: Your first meal following the procedure should be a light meal and then it is ok to progress to your normal diet.  A half-sandwich or bowl of soup is an example of a good first meal.  Heavy or fried foods are harder to digest and may make you feel nauseous or bloated.  Likewise meals heavy in dairy and vegetables can cause extra gas to form and this can also increase the bloating.  Drink plenty of fluids but you should avoid alcoholic beverages for 24 hours.  ACTIVITY: Your care partner should take you home directly after the procedure.  You should plan to take it easy, moving slowly for the rest of the day.  You can resume normal activity the day after the procedure however you should NOT DRIVE or use heavy machinery for 24 hours (because of the sedation medicines used during the test).    SYMPTOMS TO REPORT IMMEDIATELY: A gastroenterologist can be reached at any hour.  During normal business hours, 8:30 AM to 5:00 PM Monday through Friday,  call [phone].  After hours and on weekends, please call the GI answering service at [phone] who will take a message and have the physician on call contact you.   Following lower endoscopy (colonoscopy or flexible sigmoidoscopy):  Excessive amounts of blood in the stool  Significant tenderness or worsening of abdominal pains  Swelling of the abdomen that is new, acute  Fever of 100F or higher   FOLLOW UP: If any biopsies were taken you will be contacted by phone or by letter within the next 1-3 weeks.  Call your gastroenterologist if you have not heard about the biopsies in 3 weeks.  Our staff will call the home number listed on your records the next business day following your procedure to check on you and address any questions or concerns that you may have at that time regarding the information given to you following your procedure. This is a courtesy call and so if there is no answer at the home number and we have not heard from you through the emergency physician on call, we will assume that you have returned to your regular daily activities without incident.  SIGNATURES/CONFIDENTIALITY: You and/or your care partner have signed paperwork which will be entered into your electronic medical record.  These signatures attest to the fact that that the information above on your After Visit Summary has been reviewed and is understood.  Full responsibility of the confidentiality of  this discharge information lies with you and/or your care-partner.   Resume medications. Repeat procedure in 10 years.

## 2013-01-08 NOTE — Op Note (Signed)
[facility] [address] [place], [id number]   COLONOSCOPY PROCEDURE REPORT  PATIENT: Ethan Silva, Graceson  MR#: #[id number] BIRTHDATE: [birth year] , 47  yrs. old GENDER: Male ENDOSCOPIST: Louis Meckelobert D Rayyan Orsborn, MD REFERRED BY: PROCEDURE DATE:  01/08/2013 PROCEDURE:   Colonoscopy, diagnostic First Screening Colonoscopy - Avg.  risk and is 50 yrs.  old or older Yes.  Prior Negative Screening - Now for repeat screening. N/A  History of Adenoma - Now for follow-up colonoscopy & has been > or = to 3 yrs.  N/A  Polyps Removed Today? No.  Recommend repeat exam, <10 yrs? No. ASA CLASS:   Class II INDICATIONS:abdominal pain in the lower left quadrant. MEDICATIONS: MAC sedation, administered by CRNA and propofol (Diprivan) 300mg  IV  DESCRIPTION OF PROCEDURE:   After the risks benefits and alternatives of the procedure were thoroughly explained, informed consent was obtained.  A digital rectal exam revealed no abnormalities of the rectum.   The LB [id number]  endoscope was introduced through the anus and advanced to the cecum, which was identified by both the appendix and ileocecal valve. No adverse events experienced.   The quality of the prep was excellent using Suprep  The instrument was then slowly withdrawn as the colon was fully examined.      COLON FINDINGS: A normal appearing cecum, ileocecal valve, and appendiceal orifice were identified.  The ascending, hepatic flexure, transverse, splenic flexure, descending, sigmoid colon and rectum appeared unremarkable.  No polyps or cancers were seen. Retroflexed views revealed no abnormalities. The time to cecum=1 minutes 37 seconds.  Withdrawal time=7 minutes 03 seconds.  The scope was withdrawn and the procedure completed. COMPLICATIONS: There were no complications.  ENDOSCOPIC IMPRESSION: Normal colon  RECOMMENDATIONS: Continue current colorectal screening recommendations for "routine risk" patients with a repeat  colonoscopy in 10 years.   eSigned:  Louis Meckelobert D Emilio Baylock, MD 01/08/2013 4:17 PM   cc:   PATIENT NAME:  Ethan Silva, [name] MR#: #[id number]

## 2013-01-08 NOTE — Progress Notes (Signed)
Complete pre op explained through interpretor.  Pt stable to RR after procedure

## 2013-01-09 ENCOUNTER — Telehealth: Payer: Self-pay | Admitting: *Deleted

## 2013-01-09 NOTE — Telephone Encounter (Signed)
  Follow up Call-  Call back number 01/08/2013  Post procedure Call Back phone  # [phone] son's phone #  Permission to leave phone message Yes     Patient questions:  Do you have a fever, pain , or abdominal swelling? no Pain Score  0 *  Have you tolerated food without any problems? yes  Have you been able to return to your normal activities? yes  Do you have any questions about your discharge instructions: Diet   no Medications  no Follow up visit  no  Do you have questions or concerns about your Care? no  Actions: * If pain score is 4 or above: No action needed, pain <4.

## 2013-01-11 ENCOUNTER — Ambulatory Visit (INDEPENDENT_AMBULATORY_CARE_PROVIDER_SITE_OTHER): Payer: BC Managed Care – PPO | Admitting: Emergency Medicine

## 2013-01-11 VITALS — BP 132/92 | HR 102 | Temp 102.3°F | Resp 18 | Ht 63.5 in | Wt 154.8 lb

## 2013-01-11 DIAGNOSIS — J111 Influenza due to unidentified influenza virus with other respiratory manifestations: Secondary | ICD-10-CM

## 2013-01-11 MED ORDER — PSEUDOEPHEDRINE-GUAIFENESIN ER 60-600 MG PO TB12: 1.0000 | ORAL_TABLET | Freq: Two times a day (BID) | ORAL | Status: DC

## 2013-01-11 MED ORDER — LISINOPRIL 40 MG PO TABS: 40.0000 mg | ORAL_TABLET | Freq: Every day | ORAL | Status: DC

## 2013-01-11 MED ORDER — OSELTAMIVIR PHOSPHATE 75 MG PO CAPS: 75.0000 mg | ORAL_CAPSULE | Freq: Two times a day (BID) | ORAL | Status: DC

## 2013-01-11 MED ORDER — HYDROCOD POLST-CHLORPHEN POLST 10-8 MG/5ML PO LQCR: 5.0000 mL | Freq: Two times a day (BID) | ORAL | Status: DC | PRN

## 2013-01-11 NOTE — Progress Notes (Signed)
[facility] [address], [place] [address] [phone]  Date:  01/11/2013   Name:  Ethan Silva   DOB:  May 20, 1965   MRN:  [id number]  PCP:  No PCP Per Patient    Chief Complaint: Fever, Chills, Headache and Generalized Body Aches   History of Present Illness:  Ethan Silva is a 48 y.o. very pleasant male patient who presents with the following:  Ill suddenly yesterday afternoon with fever and chills, non productive cough, sore throat, myalgias, arthralgias and malaise.  Fatigued.  No nausea or vomiting. No stoll change or rash.  No wheezing or shortness of breath.  No improvement with over the counter medications or other home remedies. Denies other complaint or health concern today.   Patient Active Problem List   Diagnosis Date Noted   Abdominal  pain, other specified site 11/21/2012   Abdominal pain, right upper quadrant 10/13/2012   HTN (hypertension) 10/11/2012   Calculus of bile duct without mention of cholecystitis or obstruction 10/06/2012   Common bile duct leak 07/16/2012    Past Medical History  Diagnosis Date   Hyperlipidemia    Hypertension    Numbness and tingling in hands     Hx: of   Headache(784.0)     Hx: of when BP is elevated   Bile duct leak     Past Surgical History  Procedure Laterality Date   Cholecystectomy N/A 07/11/2012    Procedure: LAPAROSCOPIC CHOLECYSTECTOMY WITH INTRAOPERATIVE CHOLANGIOGRAM;  Surgeon: Axel FillerArmando Ramirez, MD;  [facility];  Service: General;  Laterality: N/A;   Ercp  07/16/2012   Ercp N/A 07/16/2012    Procedure: ENDOSCOPIC RETROGRADE CHOLANGIOPANCREATOGRAPHY (ERCP);  Surgeon: Louis Meckelobert D Kaplan, MD;  [facility];  Service: Gastroenterology;  Laterality: N/A;   Ercp N/A 10/06/2012    Procedure: ENDOSCOPIC RETROGRADE CHOLANGIOPANCREATOGRAPHY (ERCP);  Surgeon: Louis Meckelobert D Kaplan, MD;  [facility];  Service: Endoscopy;  Laterality: N/A;    History  Substance  Use Topics   Smoking status: Never Smoker    Smokeless tobacco: Never Used   Alcohol Use: No    Family History  Problem Relation Age of Onset   Gallstones Mother    Diabetes Paternal Uncle     No Known Allergies  Medication list has been reviewed and updated.  Current Outpatient Prescriptions on File Prior to Visit  Medication Sig Dispense Refill   lisinopril (PRINIVIL,ZESTRIL) 40 MG tablet Take 1 tablet (40 mg total) by mouth daily.  30 tablet  5   HYDROcodone-acetaminophen (NORCO/VICODIN) 5-325 MG per tablet Take 1 tablet by mouth every 6 (six) hours as needed for moderate pain.       methocarbamol (ROBAXIN) 500 MG tablet Take 1 tablet (500 mg total) by mouth 2 (two) times daily.  20 tablet  0   sulindac (CLINORIL) 200 MG tablet Take 1 tablet (200 mg total) by mouth daily.  10 tablet  0   No current facility-administered medications on file prior to visit.    Review of Systems:  As per HPI, otherwise negative.    Physical Examination: Filed Vitals:   01/11/13 1536  BP: 132/92  Pulse: 102  Temp: 102.3 F (39.1 C)  Resp: 18   Filed Vitals:   01/11/13 1536  Height: 5' 3.5" (1.613 m)  Weight: 154 lb 12.8 oz (70.217 kg)   Body mass index is 26.99 kg/(m^2). Ideal Body Weight: Weight in (lb) to have BMI = 25: 143.1  GEN: WDWN,  moderate distress, Non-toxic, A & O x 3 HEENT: Atraumatic, Normocephalic. Neck supple. No masses, No LAD. Ears and Nose: No external deformity. CV: RRR, No M/G/R. No JVD. No thrill. No extra heart sounds. PULM: CTA B, no wheezes, crackles, rhonchi. No retractions. No resp. distress. No accessory muscle use. ABD: S, NT, ND, +BS. No rebound. No HSM. EXTR: No c/c/e NEURO Normal gait.  PSYCH: Normally interactive. Conversant. Not depressed or anxious appearing.  Calm demeanor.    Assessment and Plan: Influenza tamiflu tussionex   Signed,  Phillips Odor, MD

## 2013-01-11 NOTE — Patient Instructions (Signed)
Gripe en el adulto  (Influenza, Adult)  La gripe es una infeccin viral del tracto respiratorio. Ocurre con ms frecuencia en los meses de invierno, ya que las personas pasan ms tiempo en contacto cercano. La gripe puede enfermarlo considerablemente. Se transmite de [place] persona a otra (es contagiosa). CAUSAS  La causa es un virus que infecta el tracto respiratorio. Puede contagiarse el virus al aspirar las gotitas que una persona infectada elimina al toser o Brewing technologist. Tambin puede contagiarse al tocar algo que fue recientemente contaminado con el virus y [employer] mano a la boca, la nariz o los ojos.  SNTOMAS  Los sntomas pueden durar entre 4 y 49 das y pueden ser:   Cristy Hilts.  Escalofros.  Dolor de [place], dolores en el cuerpo y musculares.  Dolor de Investment banker, operational.  Molestias en el pecho y tos.  Prdida del apetito.  Debilidad o cansancio.  Mareos.  Nuseas o vmitos. DIAGNSTICO  El diagnstico se realiza segn su historia clnica y el examen fsico. Es necesario realizar un anlisis de secreciones de la nariz y la garganta para confirmar el diagnstico.  RIESGOS Y COMPLICACIONES  Tendr mayor riesgo de sufrir un resfro grave si consume cigarrillos, es diabtico, sufre una enfermedad cardaca (como insuficiencia cardaca) o pulmonar crnica (como asma) o si tiene debilitado el sistema inmunolgico. Los ancianos y las mujeres embarazadas tienen ms riesgo de sufrir infecciones graves. La complicacin ms frecuente de la gripe es la infeccin pulmonar (pneumonia). En algunos casos esta complicacin puede requerir asistencia mdica de emergencia y puede poner en peligro su vida.  PREVENCIN  La vacunacin anual contra la gripe es la mejor manera de evitar enfermarse. Se recomienda ahora de manera rutinaria una vacuna anual contra la gripe a todos los [employer].  TRATAMIENTO  En los casos leves, la gripe se cura sin tratamiento. El tratamiento est dirigido a  Herbalist sntomas. En los casos ms graves, el mdico podr recetar medicamentos antivirales para acortar el curso de la enfermedad. Los antibiticos no son eficaces, ya que esta infeccin la causa un virus y no una bacteria.  INSTRUCCIONES PARA EL CUIDADO EN EL HOGAR   Slo tome medicamentos de venta libre o recetados para Conservation officer, historic buildings, Tree surgeon o fiebre, segn las indicaciones del mdico.  Utilice un humidificador de niebla fra para Museum/gallery exhibitions officer respiracin.  Haga reposo hasta que la temperatura sea normal: Generalmente esto lleva entre 3 y 4 das.  Debe ingerir gran cantidad de lquido para mantener la orina de tono claro o color amarillo plido.  Reunion su boca y su nariz al toser o Brewing technologist, y [employer] las manos muy bien para evitar que se propague el virus.  Foy [place] en su casa y no concurra al [place] o a la escuela hasta que la fiebre haya desaparecido al menos por 1 da completo. SOLICITE ATENCIN MDICA SI:   Siente dolor en el pecho o una tos profunda que empeora o produce ms mucosidad.  Tiene nuseas, vmitos o diarrea. SOLICITE ATENCIN [place] DE INMEDIATO SI:   Tiene dificultad para respirar, le falta la respiracin o la piel o las uas se tornan azulados.  Presenta dolor intenso o rigidez en el cuello.  Comienza a sentir dolor de cabeza, en el rostro o en los odos.  Tiene fiebre recurrente o esta empeora.  Presenta nuseas o vmitos que no puede controlar. ASEGRESE DE QUE:   Comprende estas instrucciones.  Controlar su enfermedad.  Solicitar ayuda de inmediato si no mejora o si empeora.  Document Released: 09/27/2004 Document Revised: 06/19/2011 [facility] Patient Information 2014 [place], [place].

## 2013-01-13 ENCOUNTER — Ambulatory Visit (AMBULATORY_SURGERY_CENTER): Payer: Self-pay

## 2013-01-13 ENCOUNTER — Encounter: Payer: Self-pay | Admitting: Gastroenterology

## 2013-01-13 VITALS — Ht 62.0 in | Wt 154.0 lb

## 2013-01-13 DIAGNOSIS — R1032 Left lower quadrant pain: Secondary | ICD-10-CM

## 2013-01-16 DIAGNOSIS — Z0271 Encounter for disability determination: Secondary | ICD-10-CM

## 2013-01-19 ENCOUNTER — Telehealth: Payer: Self-pay | Admitting: Gastroenterology

## 2013-01-19 NOTE — Telephone Encounter (Signed)
no

## 2013-01-20 ENCOUNTER — Encounter: Payer: BC Managed Care – PPO | Admitting: Gastroenterology

## 2013-03-28 ENCOUNTER — Ambulatory Visit (INDEPENDENT_AMBULATORY_CARE_PROVIDER_SITE_OTHER): Payer: BC Managed Care – PPO | Admitting: Family Medicine

## 2013-03-28 VITALS — BP 136/88 | HR 69 | Temp 98.2°F | Resp 16 | Ht 62.0 in | Wt 165.0 lb

## 2013-03-28 DIAGNOSIS — M549 Dorsalgia, unspecified: Secondary | ICD-10-CM

## 2013-03-28 DIAGNOSIS — IMO0002 Reserved for concepts with insufficient information to code with codable children: Secondary | ICD-10-CM

## 2013-03-28 MED ORDER — NAPROXEN 500 MG PO TABS
ORAL_TABLET | ORAL | Status: DC
Start: 2013-03-28 — End: 2015-04-12

## 2013-03-28 MED ORDER — METHOCARBAMOL 500 MG PO TABS: ORAL_TABLET | ORAL | Status: DC

## 2013-03-28 MED ORDER — HYDROCODONE-ACETAMINOPHEN 5-325 MG PO TABS: ORAL_TABLET | ORAL | Status: DC

## 2013-03-28 NOTE — Progress Notes (Signed)
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 2 weeks ago he carried sheet rock up some stairs and strained his back. It is continued to hurt him for 2 weeks. About 12 or 13 years ago he had a similar problem. He does not have chronic back pain problem. This was not filed as workers Youth workercompensation. The pain does not radiate down the leg. It hurts in the nighttime. He has continued to work.  Objective: Obviously a Education administratorpainter, with splattered paint on him. He can walk with a fairly normal gait. Straight leg raising test negative. Abdomen soft. He is tender in the lumbosacral region of his low back, and both SI joint areas. Pain extends into the muscles of the paraspinous regions of the low back, around above both hips.  Assessment: Back pain and strain  Plan: X-rays needed Give a Spanish booklet on care of his back Naproxen Robaxin Norco Return if not improving

## 2013-03-28 NOTE — Patient Instructions (Addendum)
Read the back care booklet  Take the muscle relaxant, methocarbamol, 1 at breakfast, lunch, and supper and 2 at bedtime. If it makes you to sleepy, decrease the dose.   Take the anti-inflammatory pain medication, naproxen, one twice daily at breakfast and supper  Take the hydrocodone pain pills only when needed for severe pain  If you're not improving over the next 7-10 days please return  Stay out of work until Wednesday. If you're not much better by then I will extend your note longer.

## 2013-08-23 ENCOUNTER — Ambulatory Visit (INDEPENDENT_AMBULATORY_CARE_PROVIDER_SITE_OTHER): Payer: BC Managed Care – PPO | Admitting: Family Medicine

## 2013-08-23 VITALS — BP 158/102 | HR 51 | Temp 98.0°F | Resp 16 | Ht 62.25 in | Wt 159.0 lb

## 2013-08-23 DIAGNOSIS — Z13220 Encounter for screening for lipoid disorders: Secondary | ICD-10-CM

## 2013-08-23 DIAGNOSIS — R001 Bradycardia, unspecified: Secondary | ICD-10-CM

## 2013-08-23 DIAGNOSIS — E785 Hyperlipidemia, unspecified: Secondary | ICD-10-CM

## 2013-08-23 DIAGNOSIS — K029 Dental caries, unspecified: Secondary | ICD-10-CM

## 2013-08-23 DIAGNOSIS — Z131 Encounter for screening for diabetes mellitus: Secondary | ICD-10-CM

## 2013-08-23 DIAGNOSIS — I498 Other specified cardiac arrhythmias: Secondary | ICD-10-CM

## 2013-08-23 DIAGNOSIS — I10 Essential (primary) hypertension: Secondary | ICD-10-CM

## 2013-08-23 LAB — COMPREHENSIVE METABOLIC PANEL
ALT: 30 U/L (ref 0–53)
AST: 18 U/L (ref 0–37)
Albumin: 4.6 g/dL (ref 3.5–5.2)
Alkaline Phosphatase: 38 U/L — ABNORMAL LOW (ref 39–117)
BUN: 16 mg/dL (ref 6–23)
CO2: 25 mEq/L (ref 19–32)
Calcium: 9.3 mg/dL (ref 8.4–10.5)
Chloride: 105 mEq/L (ref 96–112)
Glucose, Bld: 101 mg/dL — ABNORMAL HIGH (ref 70–99)
Potassium: 4.1 mEq/L (ref 3.5–5.3)
Sodium: 140 mEq/L (ref 135–145)
Total Bilirubin: 0.4 mg/dL (ref 0.2–1.2)
Total Protein: 7.4 g/dL (ref 6.0–8.3)

## 2013-08-23 LAB — LIPID PANEL
Cholesterol: 278 mg/dL — ABNORMAL HIGH (ref 0–200)
HDL: 40 mg/dL (ref >39)
LDL Cholesterol: 185 mg/dL — ABNORMAL HIGH (ref 0–99)
Triglycerides: 267 mg/dL — ABNORMAL HIGH (ref <150)
VLDL: 53 mg/dL — ABNORMAL HIGH (ref 0–40)

## 2013-08-23 LAB — HEMOGLOBIN A1C
Hgb A1c MFr Bld: 5.8 % — ABNORMAL HIGH (ref <5.7)
Mean Plasma Glucose: 120 mg/dL — ABNORMAL HIGH (ref <117)

## 2013-08-23 MED ORDER — TRAMADOL HCL 50 MG PO TABS: 50.0000 mg | ORAL_TABLET | Freq: Three times a day (TID) | ORAL | Status: DC | PRN

## 2013-08-23 MED ORDER — LISINOPRIL 40 MG PO TABS: 40.0000 mg | ORAL_TABLET | Freq: Every day | ORAL | Status: DC

## 2013-08-23 MED ORDER — PENICILLIN V POTASSIUM 500 MG PO TABS: 500.0000 mg | ORAL_TABLET | Freq: Three times a day (TID) | ORAL | Status: DC

## 2013-08-23 NOTE — Patient Instructions (Signed)
Take your blood pressure medication every day!  Please come and see [place] in 2-3 weeks so we can see how it is working for you.   Use the penicillin for infection around your tooth, and the tramadol for pain as needed.  Please look into your dental insurance situation- you may have insurance already!  In any case you do need to see a dentist as soon as you can I will be in touch with your labs.

## 2013-08-23 NOTE — Progress Notes (Addendum)
[facility] [address], [place] [address] [phone]  Date:  08/23/2013   Name:  Ethan Silva   DOB:  April 15, 1965   MRN:  [id number]  PCP:  No PCP Per Patient    Chief Complaint: Dizziness, Medication Refill, Labs Only and Dental Pain   History of Present Illness:  Jacobi Nile is a 48 y.o. very pleasant male patient who presents with the following:  He is here today for BP and cholesterol check- he is fasting for labs.  He just ran out of his BP medication and was not able to take it today or yesteday He also notes pain in a left mandibular tooth.  It has been hurting for about one week, he has not noted trouble with it in the past.  He does have health insurance but is not sure about dental insurance.   Also, he notes that when he looks up or first gets up in the morning he sometimes will feel vertigo for a few seconds. He has noted this for about one month  He is otherwise feeling well  Patient Active Problem List   Diagnosis Date Noted  . Abdominal  pain, other specified site 11/21/2012  . Abdominal pain, right upper quadrant 10/13/2012  . HTN (hypertension) 10/11/2012  . Calculus of bile duct without mention of cholecystitis or obstruction 10/06/2012  . Common bile duct leak 07/16/2012    Past Medical History  Diagnosis Date  . Hyperlipidemia   . Hypertension   . Numbness and tingling in hands     Hx: of  . Headache(784.0)     Hx: of when BP is elevated  . Bile duct leak     Past Surgical History  Procedure Laterality Date  . Cholecystectomy N/A 07/11/2012    Procedure: LAPAROSCOPIC CHOLECYSTECTOMY WITH INTRAOPERATIVE CHOLANGIOGRAM;  Surgeon: Axel Filler, MD;  Location: [facility] OR;  Service: General;  Laterality: N/A;  . Ercp  07/16/2012  . Ercp N/A 07/16/2012    Procedure: ENDOSCOPIC RETROGRADE CHOLANGIOPANCREATOGRAPHY (ERCP);  Surgeon: Louis Meckel, MD;  Location[facility];  Service: Gastroenterology;  Laterality: N/A;  . Ercp  N/A 10/06/2012    Procedure: ENDOSCOPIC RETROGRADE CHOLANGIOPANCREATOGRAPHY (ERCP);  Surgeon: Louis Meckel, MD;  Location: [facility];  Service: Endoscopy;  Laterality: N/A;  . Colonoscopy      History  Substance Use Topics  . Smoking status: Never Smoker   . Smokeless tobacco: Never Used  . Alcohol Use: No    Family History  Problem Relation Age of Onset  . Gallstones Mother   . Diabetes Paternal Uncle     No Known Allergies  Medication list has been reviewed and updated.  Current Outpatient Prescriptions on File Prior to Visit  Medication Sig Dispense Refill  . lisinopril (PRINIVIL,ZESTRIL) 40 MG tablet Take 1 tablet (40 mg total) by mouth daily.  30 tablet  5  . HYDROcodone-acetaminophen (NORCO) 5-325 MG per tablet Take one every 4-6 hours for severe pain only  25 tablet  0  . methocarbamol (ROBAXIN) 500 MG tablet Take one at breakfast, lunch, and supper and 2 at bedtime for muscle relaxant  40 tablet  1  . naproxen (NAPROSYN) 500 MG tablet Take one twice daily with breakfast and supper for pain and inflammation  30 tablet  0   No current facility-administered medications on file prior to visit.    Review of Systems:  As per HPI- otherwise negative.   Physical Examination: Filed Vitals:  08/23/13 1023  BP: 158/102  Pulse: 51  Temp: 98 F (36.7 C)  Resp: 16   Filed Vitals:   08/23/13 1023  Height: 5' 2.25" (1.581 m)  Weight: 159 lb (72.122 kg)   Body mass index is 28.85 kg/(m^2). Ideal Body Weight: Weight in (lb) to have BMI = 25: 137.5  GEN: WDWN, NAD, Non-toxic, A & O x 3, overweight HEENT: Atraumatic, Normocephalic. Neck supple. No masses, No LAD.  Bilateral TM wnl, oropharynx normal.  PEERL,EOMI.   Ears and Nose: No external deformity. CV: RRR, No M/G/R. No JVD. No thrill. No extra heart sounds. PULM: CTA B, no wheezes, crackles, rhonchi. No retractions. No resp. distress. No accessory muscle use. ABD: S, NT, ND, +BS. No rebound. No HSM. EXTR: No  c/c/e NEURO Normal gait. Normal strength, sensation and DTR all extremities  PSYCH: Normally interactive. Conversant. Not depressed or anxious appearing.  Calm demeanor.  Dental exam shows caries and plaque, evidence of lack of preventative care.  He has tenderness along #32- 35, mild erythema of the adjacent gums.  No evidence of abscess.    EKG: sinus bradycardia, rate in the 50s.  Otherwise normal.  Assessment and Plan: Essential hypertension - Plan: Comprehensive metabolic panel, EKG 12-Lead, lisinopril (PRINIVIL,ZESTRIL) 40 MG tablet  Dental caries - Plan: penicillin v potassium (VEETID) 500 MG tablet, traMADol (ULTRAM) 50 MG tablet, DISCONTINUED: traMADol (ULTRAM) 50 MG tablet  Bradycardia - Plan: EKG 12-Lead  Screening for hyperlipidemia - Plan: Lipid panel  Screening for diabetes mellitus - Plan: Hemoglobin A1c  His BP is up but he has not had medication in 48 hours.  Refilled his lisinopril and asked him to come back for a recheck in 2-3 weeks.  He has mild bradycardia but EKG does not show heart block- normal PR interval.  Found customer service number for him to call [employer] and ask about his dental insurance standing.   See patient instructions for more details.     Signed Abbe Amsterdam, MD  Called 8/26 to go over labs.  His home number does not work, his son answered his cell.  Asked him to pass along a message that I am sending in a chl medication to his drug store.  Will send a letter with more details.   rx for mevacor 20

## 2013-08-26 ENCOUNTER — Encounter: Payer: Self-pay | Admitting: Family Medicine

## 2013-08-26 MED ORDER — LOVASTATIN 20 MG PO TABS: 20.0000 mg | ORAL_TABLET | Freq: Every day | ORAL | Status: DC

## 2013-08-26 NOTE — Addendum Note (Signed)
Addended by: Pearline Cables on: 08/26/2013 06:28 PM   Modules accepted: Orders

## 2014-07-04 ENCOUNTER — Emergency Department (HOSPITAL_COMMUNITY)
Admission: EM | Admit: 2014-07-04 | Discharge: 2014-07-04 | Disposition: A | Discharge status: Home / Self Care | Payer: 59 | Attending: Emergency Medicine | Admitting: Emergency Medicine

## 2014-07-04 ENCOUNTER — Emergency Department (HOSPITAL_COMMUNITY): Payer: 59

## 2014-07-04 ENCOUNTER — Encounter (HOSPITAL_COMMUNITY): Payer: Self-pay | Admitting: *Deleted

## 2014-07-04 DIAGNOSIS — E785 Hyperlipidemia, unspecified: Secondary | ICD-10-CM | POA: Insufficient documentation

## 2014-07-04 DIAGNOSIS — Z79899 Other long term (current) drug therapy: Secondary | ICD-10-CM | POA: Diagnosis not present

## 2014-07-04 DIAGNOSIS — Z8719 Personal history of other diseases of the digestive system: Secondary | ICD-10-CM | POA: Insufficient documentation

## 2014-07-04 DIAGNOSIS — I10 Essential (primary) hypertension: Secondary | ICD-10-CM | POA: Insufficient documentation

## 2014-07-04 DIAGNOSIS — R079 Chest pain, unspecified: Secondary | ICD-10-CM | POA: Diagnosis not present

## 2014-07-04 LAB — BASIC METABOLIC PANEL
Anion gap: 13 (ref 5–15)
BUN: 15 mg/dL (ref 6–20)
CO2: 23 mmol/L (ref 22–32)
Calcium: 9.3 mg/dL (ref 8.9–10.3)
Chloride: 104 mmol/L (ref 101–111)
Creatinine, Ser: 0.91 mg/dL (ref 0.61–1.24)
GFR calc Af Amer: >60 mL/min (ref >60)
GFR calc non Af Amer: >60 mL/min (ref >60)
Glucose, Bld: 109 mg/dL — ABNORMAL HIGH (ref 65–99)
Potassium: 3.9 mmol/L (ref 3.5–5.1)
Sodium: 140 mmol/L (ref 135–145)

## 2014-07-04 LAB — HEPATIC FUNCTION PANEL
ALT: 38 U/L (ref 17–63)
AST: 31 U/L (ref 15–41)
Albumin: 4 g/dL (ref 3.5–5.0)
Alkaline Phosphatase: 47 U/L (ref 38–126)
Bilirubin, Direct: 0.1 mg/dL (ref 0.1–0.5)
Indirect Bilirubin: 0.6 mg/dL (ref 0.3–0.9)
Total Bilirubin: 0.7 mg/dL (ref 0.3–1.2)
Total Protein: 7.1 g/dL (ref 6.5–8.1)

## 2014-07-04 LAB — CBC
HCT: 48.2 % (ref 39.0–52.0)
Hemoglobin: 17.1 g/dL — ABNORMAL HIGH (ref 13.0–17.0)
MCHC: 35.5 g/dL (ref 30.0–36.0)
MCV: 85.5 fL (ref 78.0–100.0)
Platelets: 197 10*3/uL (ref 150–400)
RBC: 5.64 MIL/uL (ref 4.22–5.81)
RDW: 13.4 % (ref 11.5–15.5)
WBC: 7.1 10*3/uL (ref 4.0–10.5)

## 2014-07-04 LAB — I-STAT TROPONIN, ED: Troponin i, poc: 0 ng/mL (ref 0.00–0.08)

## 2014-07-04 LAB — LIPASE, BLOOD: Lipase: 32 U/L (ref 22–51)

## 2014-07-04 NOTE — ED Notes (Signed)
NAD at this time. Pt is stable and going home with his wife. 

## 2014-07-04 NOTE — Discharge Instructions (Signed)
Try taking Maalox, 2 tablespoons, before meals and at bedtime, to help with the discomfort.    Dolor de pecho (no especfico) (Chest Pain (Nonspecific)) Con frecuencia es difcil dar un diagnstico especfico de la causa del dolor de Dwalepecho. Siempre hay una posibilidad de que el dolor podra estar relacionado con algo grave, como un ataque al corazn o un cogulo sanguneo en los pulmones. Debe someterse a controles con el mdico para ms evaluaciones. CAUSAS   Acidez.  Neumona o bronquitis.  Ansiedad o estrs.  Inflamacin de la zona que rodea al corazn (pericarditis) o a los pulmones (pleuritis o pleuresa).  Un cogulo sanguneo en el pulmn.  Colapso de un pulmn (neumotrax), que puede aparecer de Regions Financial Corporationmanera repentina por s solo (neumotrax espontneo) o debido a un traumatismo en el trax.  Culebrilla (virus del herpes zster). La pared torcica est compuesta por huesos, msculos y TEFL teachercartlago. Cualquiera de estos puede ser la fuente del dolor.  Puede haber una contusin en los huesos debido a una lesin.  Puede haber un esguince en los msculos o el cartlago ocasionado por la tos o por Clariontrabajo excesivo.  El cartlago puede verse afectado por una inflamacin y Psychologist, counsellingprovocar dolor (costocondritis). DIAGNSTICO  Gretchen ShortQuizs se necesiten anlisis de laboratorio u otros estudios para Veterinary surgeonencontrar la causa del Engineer, miningdolor. Adems, puede indicarle que se haga una prueba llamada electrocadiograma (ECG) ambulatorio. El ECG registra los patrones de los latidos cardacos durante 24horas. Adems, pueden hacerle otros estudios, por ejemplo:  Ecocardiograma transtorcico (ETT). Durante Management consultantel ecocardiograma, se usan ondas sonoras para evaluar el flujo de la sangre a travs del corazn.  Ecocardiograma transesofgico (ETE).  Monitoreo cardaco. Permite que el mdico controle la frecuencia y el ritmo cardaco en tiempo real.  Monitor Holter. Es un dispositivo porttil que eBayregistra los latidos cardacos y  [place] and the Grenadinesayuda a Education administratordiagnosticar las arritmias cardacas. Le permite al American Expressmdico registrar la actividad cardaca durante varios das, si es necesario.  Pruebas de estrs por ejercicio o por medicamentos que aceleran los latidos cardacos. TRATAMIENTO   El tratamiento depende de la causa del dolor de Jim Fallspecho. El tratamiento puede incluir:  Inhibidores de la acidez estomacal.  Antiinflamatorios.  Analgsicos para las enfermedades inflamatorias.  Antibiticos, si hay una infeccin.  Podrn aconsejarle que modifique su estilo de vida. Esto incluye dejar de fumar y evitar el alcohol, la cafena y el chocolate.  Pueden aconsejarle que mantenga la cabeza levantada (elevada) cuando duerme. Esto reduce la probabilidad de que el cido retroceda del estmago al esfago. En la International Business Machinesmayora de los casos, el dolor de pecho no especfico mejorar en el trmino de 2 a 3das, con reposo y IAC/InterActiveCorpanalgsicos suaves.  INSTRUCCIONES PARA EL CUIDADO EN EL HOGAR   Si le prescriben antibiticos, tmelos tal como se le indic. Termnelos aunque comience a sentirse mejor.  [address]Durante los das siguientes, no haga actividades fsicas que provoquen dolor de Watervlietpecho. Contine con las actividades fsicas tal como se le indic  No consuma ningn producto que contenga tabaco, incluidos cigarrillos, tabaco de Theatre managermascar o cigarrillos electrnicos.  Evite el consumo de alcohol.  Tome los medicamentos solamente como se lo haya indicado el mdico.  Siga las sugerencias del mdico en lo que respecta a las pruebas adicionales, si el dolor de pecho no desaparece.  Concurra a todas las visitas de control programadas. Si no lo hace, podra desarrollar problemas permanentes (crnicos) relacionados con el dolor. Si hay algn problema para concurrir a una cita, llame para reprogramarla. SOLICITE ATENCIN MDICA SI:   El  dolor de pecho no desaparece, incluso despus del tratamiento.  Tiene una erupcin cutnea con ampollas en el pecho.  Tiene  fiebre. SOLICITE ATENCIN MDICA DE [employer] SI:   Aumenta el dolor de pecho o este se irradia hacia el brazo, el cuello, la [place], la espalda o el abdomen.  Le falta el aire.  La tos empeora, o expectora sangre.  Siente dolor intenso en la espalda o el abdomen.  Se siente nauseoso o vomita.  Siente debilidad intensa.  Se desmaya.  Tiene escalofros. Esto es Radio broadcast assistant. No espere a ver si el dolor se pasa. Obtenga ayuda mdica de inmediato. Llame a los servicios de emergencia locales (911 en los [place]). No conduzca por sus propios medios [facility]. ASEGRESE DE QUE:   Comprende estas instrucciones.  Controlar su afeccin.  Recibir ayuda de inmediato si no mejora o si empeora. Document Released: 12/18/2004 Document Revised: 12/23/2012 [facility] Patient Information 2015 [place], [place]. This information is not intended to replace advice given to you by your health care provider. Make sure you discuss any questions you have with your health care provider.

## 2014-07-04 NOTE — ED Notes (Signed)
Pt to ED c/o L sided non radiating chest pain onset two days ago associated with NV. Also reports pain worsens on exertion

## 2014-07-04 NOTE — ED Provider Notes (Signed)
CSN: [id number]     Arrival date & time 07/04/14  0616 History   First MD Initiated Contact with Patient 07/04/14 0701     Chief Complaint  Patient presents with  . Chest Pain     (Consider location/radiation/quality/duration/timing/severity/associated sxs/prior Treatment) HPI   Ethan Silva is a 49 y.o. male  who presents for evaluation of intermittent chest pain which is in the left anterior chest. The chest discomfort comes and goes. It is not associated with any other symptoms. He denies fever, chills, productive cough, vomiting, weakness or dizziness. He had a similar episode of pain one year ago, was evaluated, but nothing was found to cause it. He reports that he is taking his usual medications, without relief. He has plans to see a new primary care provider. There are no other known modifying factors.  Past Medical History  Diagnosis Date  . Hyperlipidemia   . Hypertension   . Numbness and tingling in hands     Hx: of  . Headache(784.0)     Hx: of when BP is elevated  . Bile duct leak    Past Surgical History  Procedure Laterality Date  . Cholecystectomy N/A 07/11/2012    Procedure: LAPAROSCOPIC CHOLECYSTECTOMY WITH INTRAOPERATIVE CHOLANGIOGRAM;  Surgeon: Axel FillerArmando Ramirez, MD;  Location: [facility] OR;  Service: General;  Laterality: N/A;  . Ercp  07/16/2012  . Ercp N/A 07/16/2012    Procedure: ENDOSCOPIC RETROGRADE CHOLANGIOPANCREATOGRAPHY (ERCP);  Surgeon: Louis Meckelobert D Kaplan, MD;  [facility];  Service: Gastroenterology;  Laterality: N/A;  . Ercp N/A 10/06/2012    Procedure: ENDOSCOPIC RETROGRADE CHOLANGIOPANCREATOGRAPHY (ERCP);  Surgeon: Louis Meckelobert D Kaplan, MD;  [facility];  Service: Endoscopy;  Laterality: N/A;  . Colonoscopy     Family History  Problem Relation Age of Onset  . Gallstones Mother   . Diabetes Paternal Uncle    History  Substance Use Topics  . Smoking status: Never Smoker   . Smokeless tobacco: Never Used  . Alcohol Use: No    Review of  Systems  All other systems reviewed and are negative.     Allergies  Review of patient's allergies indicates no known allergies.  Home Medications   Prior to Admission medications   Medication Sig Start Date End Date Taking? Authorizing Provider  lisinopril (PRINIVIL,ZESTRIL) 40 MG tablet Take 1 tablet (40 mg total) by mouth daily. 08/23/13  Yes Gwenlyn FoundJessica C Copland, MD  lovastatin (MEVACOR) 20 MG tablet Take 1 tablet (20 mg total) by mouth at bedtime. 08/26/13  Yes Gwenlyn FoundJessica C Copland, MD  methocarbamol (ROBAXIN) 500 MG tablet Take one at breakfast, lunch, and supper and 2 at bedtime for muscle relaxant Patient not taking: Reported on 07/04/2014 03/28/13   Peyton Najjaravid H Hopper, MD  naproxen (NAPROSYN) 500 MG tablet Take one twice daily with breakfast and supper for pain and inflammation Patient not taking: Reported on 07/04/2014 03/28/13   Peyton Najjaravid H Hopper, MD  penicillin v potassium (VEETID) 500 MG tablet Take 1 tablet (500 mg total) by mouth 3 (three) times daily. Patient not taking: Reported on 07/04/2014 08/23/13   Pearline CablesJessica C Copland, MD  traMADol (ULTRAM) 50 MG tablet Take 1 tablet (50 mg total) by mouth every 8 (eight) hours as needed. Patient not taking: Reported on 07/04/2014 08/23/13   Gwenlyn FoundJessica C Copland, MD   BP 148/95 mmHg  Pulse 64  Temp(Src) 99.3 F (37.4 C)  Resp 18  SpO2 96% Physical Exam  Constitutional: He is oriented to person, place, and time. He appears  well-developed and well-nourished.  HENT:  Head: Normocephalic and atraumatic.  Right Ear: External ear normal.  Left Ear: External ear normal.  Eyes: Conjunctivae and EOM are normal. Pupils are equal, round, and reactive to light.  Neck: Normal range of motion and phonation normal. Neck supple.  Cardiovascular: Normal rate, regular rhythm and normal heart sounds.   Pulmonary/Chest: Effort normal and breath sounds normal. He exhibits no tenderness and no bony tenderness.  Abdominal: Soft. There is no tenderness.  Musculoskeletal:  Normal range of motion.  Neurological: He is alert and oriented to person, place, and time. No cranial nerve deficit or sensory deficit. He exhibits normal muscle tone. Coordination normal.  Skin: Skin is warm, dry and intact.  Psychiatric: He has a normal mood and affect. His behavior is normal. Judgment and thought content normal.  Nursing note and vitals reviewed.   ED Course  Procedures (including critical care time) Medications - No data to display  Patient Vitals for the past 24 hrs:  BP Temp Pulse Resp SpO2  07/04/14 0914 148/95 mmHg - 64 18 96 %  07/04/14 0845 143/94 mmHg - 74 18 96 %  07/04/14 0830 139/93 mmHg - 63 19 94 %  07/04/14 0800 156/97 mmHg - 63 16 97 %  07/04/14 0730 145/97 mmHg - 60 18 95 %  07/04/14 0715 156/93 mmHg - 65 18 95 %  07/04/14 0656 (!) 165/101 mmHg - 70 18 95 %  07/04/14 0625 (!) 197/114 mmHg 99.3 F (37.4 C) 75 22 96 %    9:25 AM Reevaluation with update and discussion. After initial assessment and treatment, an updated evaluation reveals he relates that he has had some intermittent tingling in his lower chest, in the last hour. Currently having no chest pain. Findings discussed with patient and wife, all questions answered. Ethan Silva L    Labs Review Labs Reviewed  CBC - Abnormal; Notable for the following:    Hemoglobin 17.1 (*)    All other components within normal limits  BASIC METABOLIC PANEL - Abnormal; Notable for the following:    Glucose, Bld 109 (*)    All other components within normal limits  HEPATIC FUNCTION PANEL  LIPASE, BLOOD  I-STAT TROPOININ, ED    Imaging Review Dg Chest 2 View  07/04/2014   CLINICAL DATA:  Non radiating left-sided chest pain, onset 2 days ago.  EXAM: CHEST  2 VIEW  COMPARISON:  10/28/2012  FINDINGS: The heart size and mediastinal contours are within normal limits. Both lungs are clear. The visualized skeletal structures are unremarkable.  IMPRESSION: No active cardiopulmonary disease.   Electronically  Signed   By: Ellery Plunk M.D.   On: 07/04/2014 06:46     EKG Interpretation   Date/Time:  Sunday July 04 2014 06:24:04 EDT Ventricular Rate:  75 PR Interval:  149 QRS Duration: 88 QT Interval:  402 QTC Calculation: 449 R Axis:   31 Text Interpretation:  Sinus rhythm Probable left atrial enlargement  Abnormal R-wave progression, late transition Baseline wander in lead(s) II  III aVF since last tracing no significant change Confirmed by Momen Ham  MD,  Poetry Cerro ([id number]) on 07/04/2014 7:34:23 AM      MDM   Final diagnoses:  Nonspecific chest pain    Nonspecific CP in low risk patient for cardiac disease. Doubt ACS, PE or pneumonia. Suspect GI source.  Nursing Notes Reviewed/ Care Coordinated Applicable Imaging Reviewed Interpretation of Laboratory Data incorporated into ED treatment  The patient appears reasonably screened and/or stabilized  for discharge and I doubt any other medical condition or other [facility] requiring further screening, evaluation, or treatment in the ED at this time prior to discharge.  Plan: Home Medications- Antacid AC/HS; Home Treatments- rest; return here if the recommended treatment, does not improve the symptoms; Recommended follow up- PCP on 06/12/14 as scheduled     Mancel Bale, MD 07/04/14 [phone]

## 2014-09-01 ENCOUNTER — Other Ambulatory Visit: Payer: Self-pay | Admitting: Family Medicine

## 2014-09-03 ENCOUNTER — Other Ambulatory Visit: Payer: Self-pay | Admitting: Family Medicine

## 2014-09-03 DIAGNOSIS — E785 Hyperlipidemia, unspecified: Secondary | ICD-10-CM

## 2014-09-03 MED ORDER — LOVASTATIN 20 MG PO TABS: 20.0000 mg | ORAL_TABLET | Freq: Every day | ORAL | Status: DC

## 2015-01-02 DIAGNOSIS — N189 Chronic kidney disease, unspecified: Secondary | ICD-10-CM

## 2015-01-02 HISTORY — DX: Chronic kidney disease, unspecified: N18.9

## 2015-02-19 ENCOUNTER — Ambulatory Visit (INDEPENDENT_AMBULATORY_CARE_PROVIDER_SITE_OTHER): Payer: BLUE CROSS/BLUE SHIELD | Admitting: Internal Medicine

## 2015-02-19 VITALS — BP 130/78 | HR 78 | Temp 98.6°F | Resp 12 | Ht 63.0 in | Wt 169.0 lb

## 2015-02-19 DIAGNOSIS — G5601 Carpal tunnel syndrome, right upper limb: Secondary | ICD-10-CM

## 2015-02-19 MED ORDER — MELOXICAM 15 MG PO TABS: 15.0000 mg | ORAL_TABLET | Freq: Every day | ORAL | Status: DC

## 2015-02-19 NOTE — Patient Instructions (Signed)
Sndrome del tnel carpiano (Carpal Tunnel Syndrome) El sndrome del tnel carpiano es una afeccin que causa dolor en la mano y en el brazo. El tnel carpiano es un espacio estrecho ubicado en el lado palmar de la [place]. Los movimientos de la [place] o ciertas enfermedades pueden causar hinchazn del tnel. Esta hinchazn comprime el nervio principal de la mueca (nervio mediano). CAUSAS  Esta afeccin puede ser causada por lo siguiente:   Movimientos repetidos de [employer].  Lesiones en la [place].  Artritis.  Un quiste o un tumor en el tnel carpiano.  Acumulacin de lquido Solicitor. A veces, se desconoce la causa de esta afeccin.  FACTORES DE RIESGO Es ms probable que esta afeccin se manifieste en:   Las personas que tienen trabajos en los que deben [employer] mismos movimientos repetidos de las [place], como los carniceros y los cajeros.  Las mujeres.  Las personas que tienen determinadas enfermedades, por ejemplo:  Diabetes.  Obesidad.  Tiroides hipoactiva (hipotiroidismo).  Insuficiencia renal. SNTOMAS  Los sntomas de esta afeccin incluyen lo siguiente:   Sensacin de hormigueo en los dedos de la mano, Production assistant, radio, el ndice y el dedo [place].  Hormigueo o adormecimiento en la mano.  Sensacin de Social research officer, government en todo el brazo, especialmente cuando la [place] y el codo estn flexionados durante [place].  Dolor en la mueca que sube por el brazo hasta el hombro.  Dolor que baja por la mano o los dedos.  Sensacin de [employer]. Tal vez tenga dificultad para tomar y Licensed conveyancer. Los sntomas pueden empeorar durante la noche.  DIAGNSTICO  Esta afeccin se diagnostica mediante la historia clnica y un examen fsico. Tambin pueden hacerle exmenes, que incluyen los siguientes:   Electromiografa (EMG). Esta prueba mide las seales elctricas que los nervios les envan a los msculos.  Radiografas. TRATAMIENTO  El  tratamiento de esta afeccin incluye lo siguiente:  Cambios en el estilo de vida. Es importante dejar de Field seismologist o modificar la actividad que caus la afeccin.  Fisioterapia o terapia ocupacional.  Analgsicos y antiinflamatorios. Esto puede incluir medicamentos que se inyectan en la [place].  Una frula para la [place].  [place]. INSTRUCCIONES PARA EL CUIDADO EN EL HOGAR  Si tiene una frula:  sela como se lo haya indicado el mdico. Qutesela solamente como se lo haya indicado el mdico.  Afloje la frula si los dedos se le entumecen, siente hormigueos o se le enfran y se tornan de Optician, dispensing.  Mantenga la frula limpia y seca. Instrucciones generales  [employer] de venta libre y los recetados solamente como se lo haya indicado el mdico.  Haga reposar la [place] de toda actividad que le cause dolor. Si la afeccin tiene relacin con Leander Rams, hable con su empleador [employer] pueden [place], por [place], usar una almohadilla para apoyar la mueca mientras tipea.  Si se lo indican, aplique hielo sobre la zona dolorida:  Ponga el hielo en una bolsa plstica.  Coloque una toalla entre la piel y la bolsa de hielo.  Coloque el hielo durante 72minutos, 2 a 3veces por Training and development officer.  Concurra a todas las visitas de control como se lo haya indicado el mdico. Esto es importante.  Haga los ejercicios como se lo hayan indicado el mdico, el fisioterapeuta o el terapeuta ocupacional. SOLICITE ATENCIN MDICA SI:   Aparecen nuevos sntomas.  El dolor no se alivia con los [employer].  Los sntomas empeoran.  Esta informacin no tiene Theme park manager el consejo del mdico. Asegrese de hacerle al mdico cualquier pregunta que tenga.   Document Released: 12/18/2004 Document Revised: 09/08/2014 Elsevier Interactive Patient Education 2016 Elsevier Inc. Carpal Tunnel Syndrome Carpal tunnel syndrome is a condition that causes pain in your hand and arm. The  carpal tunnel is a narrow area located on the palm side of your wrist. Repeated wrist motion or certain diseases may cause swelling within the tunnel. This swelling pinches the main nerve in the wrist (median nerve). CAUSES  This condition may be caused by:   Repeated wrist motions.  Wrist injuries.  Arthritis.  A cyst or tumor in the carpal tunnel.  Fluid buildup during pregnancy. Sometimes the cause of this condition is not known.  RISK FACTORS This condition is more likely to develop in:   People who have jobs that cause them to repeatedly move their wrists in the same motion, such as butchers and cashiers.  Women.  People with certain conditions, such as:  Diabetes.  Obesity.  An underactive thyroid (hypothyroidism).  Kidney failure. SYMPTOMS  Symptoms of this condition include:   A tingling feeling in your fingers, especially in your thumb, index, and middle fingers.  Tingling or numbness in your hand.  An aching feeling in your entire arm, especially when your wrist and elbow are bent for long periods of time.  Wrist pain that goes up your arm to your shoulder.  Pain that goes down into your palm or fingers.  A weak feeling in your hands. You may have trouble grabbing and holding items. Your symptoms may feel worse during the night.  DIAGNOSIS  This condition is diagnosed with a medical history and physical exam. You may also have tests, including:   An electromyogram (EMG). This test measures electrical signals sent by your nerves into the muscles.  X-rays. TREATMENT  Treatment for this condition includes:  Lifestyle changes. It is important to stop doing or modify the activity that caused your condition.  Physical or occupational therapy.  Medicines for pain and inflammation. This may include medicine that is injected into your wrist.  A wrist splint.  Surgery. HOME CARE INSTRUCTIONS  If You Have a Splint:  Wear it as told by your health care  provider. Remove it only as told by your health care provider.  Loosen the splint if your fingers become numb and tingle, or if they turn cold and blue.  Keep the splint clean and dry. General Instructions  Take over-the-counter and prescription medicines only as told by your health care provider.  Rest your wrist from any activity that may be causing your pain. If your condition is work related, talk to your employer about changes that can be made, such as getting a wrist pad to use while typing.  If directed, apply ice to the painful area:  Put ice in a plastic bag.  Place a towel between your skin and the bag.  Leave the ice on for 20 minutes, 2-3 times per day.  Keep all follow-up visits as told by your health care provider. This is important.  Do any exercises as told by your health care provider, physical therapist, or occupational therapist. SEEK MEDICAL CARE IF:   You have new symptoms.  Your pain is not controlled with medicines.  Your symptoms get worse.   This information is not intended to replace advice given to you by your health care provider. Make sure you discuss any questions you have  with your health care provider.   Document Released: 12/16/1999 Document Revised: 09/08/2014 Document Reviewed: 05/05/2014 Elsevier Interactive Patient Education [employer].

## 2015-02-19 NOTE — Progress Notes (Signed)
   Subjective:    Patient ID: Ethan Silva, male    DOB: February 22, 1965, 50 y.o.   MRN: [id number] By signing my name below, I, Littie Deeds, attest that this documentation has been prepared under the direction and in the presence of Ellamae Sia, MD.  Electronically Signed: Littie Deeds, Medical Scribe. 02/19/2015. 1:40 PM.  HPI HPI Comments: Ethan Silva is a 50 y.o. male who presents to the [facility] complaining of intermittent right hand pain that started about a month ago. 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 The pain is worse after working. He reports having associated numbness in his fingers, which is sometimes noticed while working. He sometimes feels swelling to his hand and sometimes feel like his fingers may pop. The pain sometimes wakes him up at night.   Review of Systems     Objective:   Physical Exam  Constitutional: He is oriented to person, place, and time. He appears well-developed and well-nourished. No distress.  HENT:  Head: Normocephalic and atraumatic.  Mouth/Throat: Oropharynx is clear and moist. No oropharyngeal exudate.  Eyes: Pupils are equal, round, and reactive to light.  Neck: Neck supple.  Cardiovascular: Normal rate.   Pulmonary/Chest: Effort normal.  Musculoskeletal: He exhibits no edema or tenderness.  No obvious swelling or joint tenderness in the right hand.  Neurological: He is alert and oriented to person, place, and time. No cranial nerve deficit.  Tinel's negative. Grip is full.  Skin: Skin is warm and dry. No rash noted.  Psychiatric: He has a normal mood and affect. His behavior is normal.  Nursing note and vitals reviewed. BP 130/78 mmHg  Pulse 78  Temp(Src) 98.6 F (37 C)  Resp 12  Ht  (1.6 m)  Wt 169 lb (76.658 kg)  BMI 29.94 kg/m2  SpO2 95%         Assessment & Plan:  Carpal tunnel syndrome of right wrist - Plan: Ambulatory referral to Orthopedic Surgery wrist splint at  night and at work Ref to Dr Lajoyce Corners for eval next  Meds ordered this encounter  Medications  . meloxicam (MOBIC) 15 MG tablet    Sig: Take 1 tablet (15 mg total) by mouth daily.    Dispense:  30 tablet    Refill:  0     I have completed the patient encounter in its entirety as documented by the scribe, with editing by me where necessary. Amirr Achord P. Merla Riches, M.D.

## 2015-04-08 DIAGNOSIS — R110 Nausea: Secondary | ICD-10-CM | POA: Diagnosis not present

## 2015-04-08 DIAGNOSIS — Z79899 Other long term (current) drug therapy: Secondary | ICD-10-CM | POA: Diagnosis not present

## 2015-04-08 DIAGNOSIS — Z791 Long term (current) use of non-steroidal anti-inflammatories (NSAID): Secondary | ICD-10-CM | POA: Diagnosis not present

## 2015-04-08 DIAGNOSIS — R079 Chest pain, unspecified: Secondary | ICD-10-CM | POA: Insufficient documentation

## 2015-04-08 DIAGNOSIS — Z8719 Personal history of other diseases of the digestive system: Secondary | ICD-10-CM | POA: Diagnosis not present

## 2015-04-08 DIAGNOSIS — I159 Secondary hypertension, unspecified: Secondary | ICD-10-CM | POA: Insufficient documentation

## 2015-04-08 DIAGNOSIS — E785 Hyperlipidemia, unspecified: Secondary | ICD-10-CM | POA: Diagnosis not present

## 2015-04-08 DIAGNOSIS — R6883 Chills (without fever): Secondary | ICD-10-CM | POA: Diagnosis not present

## 2015-04-08 DIAGNOSIS — I10 Essential (primary) hypertension: Secondary | ICD-10-CM | POA: Insufficient documentation

## 2015-04-08 NOTE — ED Notes (Signed)
Pt states about 2 hours prior to arrival he started having sudden onset of left side chest pressure with nausea and SOB; Pt stats symptoms increased over that time; pt states he has a dry productive cough; Pt has reports having chills; denies fever at home; Pt c/o of chest pain 5/10 on arrival; Pt a&ox 4 on arrival; Pt denies being around anyone who has been sick;

## 2015-04-09 ENCOUNTER — Encounter (HOSPITAL_COMMUNITY): Payer: Self-pay

## 2015-04-09 ENCOUNTER — Emergency Department (HOSPITAL_COMMUNITY)
Admission: EM | Admit: 2015-04-09 | Discharge: 2015-04-09 | Disposition: A | Discharge status: Home / Self Care | Payer: BLUE CROSS/BLUE SHIELD | Attending: Emergency Medicine | Admitting: Emergency Medicine

## 2015-04-09 ENCOUNTER — Emergency Department (HOSPITAL_COMMUNITY): Payer: BLUE CROSS/BLUE SHIELD

## 2015-04-09 DIAGNOSIS — R079 Chest pain, unspecified: Secondary | ICD-10-CM

## 2015-04-09 DIAGNOSIS — I159 Secondary hypertension, unspecified: Secondary | ICD-10-CM

## 2015-04-09 LAB — I-STAT TROPONIN, ED
Troponin i, poc: 0 ng/mL (ref 0.00–0.08)
Troponin i, poc: 0.01 ng/mL (ref 0.00–0.08)

## 2015-04-09 LAB — CBC
HCT: 47.8 % (ref 39.0–52.0)
Hemoglobin: 16.3 g/dL (ref 13.0–17.0)
MCH: 29.5 pg (ref 26.0–34.0)
MCHC: 34.1 g/dL (ref 30.0–36.0)
MCV: 86.4 fL (ref 78.0–100.0)
Platelets: 225 10*3/uL (ref 150–400)
RBC: 5.53 MIL/uL (ref 4.22–5.81)
RDW: 13.5 % (ref 11.5–15.5)
WBC: 7.4 10*3/uL (ref 4.0–10.5)

## 2015-04-09 LAB — BASIC METABOLIC PANEL
Anion gap: 12 (ref 5–15)
BUN: 16 mg/dL (ref 6–20)
CO2: 25 mmol/L (ref 22–32)
Calcium: 9 mg/dL (ref 8.9–10.3)
Chloride: 103 mmol/L (ref 101–111)
Creatinine, Ser: 0.93 mg/dL (ref 0.61–1.24)
GFR calc Af Amer: >60 mL/min (ref >60)
GFR calc non Af Amer: >60 mL/min (ref >60)
Glucose, Bld: 93 mg/dL (ref 65–99)
Potassium: 3.6 mmol/L (ref 3.5–5.1)
Sodium: 140 mmol/L (ref 135–145)

## 2015-04-09 MED ORDER — ACETAMINOPHEN 325 MG PO TABS
650.0000 mg | ORAL_TABLET | Freq: Once | ORAL | Status: AC
  Administered 2015-04-09: 650 mg via ORAL
  Filled 2015-04-09: qty 2

## 2015-04-09 NOTE — ED Notes (Signed)
Dr. Wentz at bedside. 

## 2015-04-09 NOTE — ED Provider Notes (Signed)
CSN: [id number]     Arrival date & time 04/08/15  2351 History  By signing my name below, I, Placido Sou, attest that this documentation has been prepared under the direction and in the presence of Mancel Bale, MD. Electronically Signed: Placido Sou, ED Scribe. 04/09/2015. 2:46 AM.   Chief Complaint  Patient presents with  . Chest Pain  . Nausea  . Chills   The history is provided by the patient and a relative. No language interpreter was used.    HPI Comments: Jarrel Knoke is a 50 y.o. male with a PMHx of HLD and HTN who presents to the Emergency Department complaining of sudden onset, stabbing/pressure, 5/10, left sided CP onset at 10:00 PM. He reports that he was lying down at the onset of his pain and denies a hx of similar symptoms. His pain worsens with deep breathing and movement. Pt reports chills and nausea as well as a feeling of "leg heaviness" which has since alleviated. Pt denies a hx of narcotic abuse, ETOH abuse or smoking. He works in Holiday representative. He denies diaphoresis, back pain, abd pain, vomiting or any other associated symptoms at this time.   Past Medical History  Diagnosis Date  . Hyperlipidemia   . Hypertension   . Numbness and tingling in hands     Hx: of  . Headache(784.0)     Hx: of when BP is elevated  . Bile duct leak    Past Surgical History  Procedure Laterality Date  . Cholecystectomy N/A 07/11/2012    Procedure: LAPAROSCOPIC CHOLECYSTECTOMY WITH INTRAOPERATIVE CHOLANGIOGRAM;  Surgeon: Axel Filler, MD;  Location: [facility] OR;  Service: General;  Laterality: N/A;  . Ercp  07/16/2012  . Ercp N/A 07/16/2012    Procedure: ENDOSCOPIC RETROGRADE CHOLANGIOPANCREATOGRAPHY (ERCP);  Surgeon: Louis Meckel, MD;  Location: [facility] OR;  Service: Gastroenterology;  Laterality: N/A;  . Ercp N/A 10/06/2012    Procedure: ENDOSCOPIC RETROGRADE CHOLANGIOPANCREATOGRAPHY (ERCP);  Surgeon: Louis Meckel, MD;  Location: [facility];  Service: Endoscopy;  Laterality:  N/A;  . Colonoscopy     Family History  Problem Relation Age of Onset  . Gallstones Mother   . Diabetes Paternal Uncle    Social History  Substance Use Topics  . Smoking status: Never Smoker   . Smokeless tobacco: Never Used  . Alcohol Use: No    Review of Systems  Constitutional: Positive for chills. Negative for diaphoresis.  Cardiovascular: Positive for chest pain.  Gastrointestinal: Positive for nausea. Negative for vomiting and abdominal pain.  Musculoskeletal: Negative for back pain.  All other systems reviewed and are negative.  Allergies  Review of patient's allergies indicates no known allergies.  Home Medications   Prior to Admission medications   Medication Sig Start Date End Date Taking? Authorizing Provider  lisinopril (PRINIVIL,ZESTRIL) 40 MG tablet TAKE ONE TABLET BY MOUTH ONCE DAILY.   "OV NEEDED FOR REFILLS" 09/03/14  Yes Chelle Jeffery, PA-C  lovastatin (MEVACOR) 20 MG tablet Take 1 tablet (20 mg total) by mouth at bedtime. 09/03/14  Yes Gwenlyn Found Copland, MD  meloxicam (MOBIC) 15 MG tablet Take 1 tablet (15 mg total) by mouth daily. 02/19/15  Yes Tonye Pearson, MD  methocarbamol (ROBAXIN) 500 MG tablet Take one at breakfast, lunch, and supper and 2 at bedtime for muscle relaxant Patient not taking: Reported on 07/04/2014 03/28/13   Peyton Najjar, MD  naproxen (NAPROSYN) 500 MG tablet Take one twice daily with breakfast and supper for pain and inflammation Patient not  taking: Reported on 07/04/2014 03/28/13   Peyton Najjar, MD  penicillin v potassium (VEETID) 500 MG tablet Take 1 tablet (500 mg total) by mouth 3 (three) times daily. Patient not taking: Reported on 07/04/2014 08/23/13   Pearline Cables, MD  traMADol (ULTRAM) 50 MG tablet Take 1 tablet (50 mg total) by mouth every 8 (eight) hours as needed. Patient not taking: Reported on 07/04/2014 08/23/13   Gwenlyn Found Copland, MD   BP 147/96 mmHg  Pulse 64  Temp(Src) 98.8 F (37.1 C) (Oral)  Resp 18  Ht   (1.6 m)  Wt 171 lb 4 oz (77.678 kg)  BMI 30.34 kg/m2  SpO2 95% Physical Exam  Constitutional: He is oriented to person, place, and time. He appears well-developed and well-nourished.  HENT:  Head: Normocephalic and atraumatic.  Right Ear: External ear normal.  Left Ear: External ear normal.  Eyes: Conjunctivae and EOM are normal. Pupils are equal, round, and reactive to light.  Neck: Normal range of motion and phonation normal. Neck supple.  Cardiovascular: Normal rate, regular rhythm and normal heart sounds.  Exam reveals no gallop and no friction rub.   No murmur heard. Hypertensive during examination  Pulmonary/Chest: Effort normal and breath sounds normal. No respiratory distress. He has no wheezes. He has no rales. He exhibits no bony tenderness.  Abdominal: Soft. There is no tenderness.  Musculoskeletal: Normal range of motion.  Neurological: He is alert and oriented to person, place, and time. No cranial nerve deficit or sensory deficit. He exhibits normal muscle tone. Coordination normal.  Skin: Skin is warm, dry and intact.  Psychiatric: He has a normal mood and affect. His behavior is normal. Judgment and thought content normal.  Nursing note and vitals reviewed.  ED Course  Procedures  DIAGNOSTIC STUDIES: Oxygen Saturation is 93% on RA, adequate by my interpretation.    COORDINATION OF CARE: 2:45 AM Discussed next steps with pt. He verbalized understanding and is agreeable with the plan.   Labs Review Labs Reviewed  BASIC METABOLIC PANEL  CBC  I-STAT TROPOININ, ED  I-STAT TROPOININ, ED  Rosezena Sensor, ED   Imaging Review Dg Chest 2 View  04/09/2015  CLINICAL DATA:  Left-sided chest pain and pressure, nausea and shortness of breath for 2 hours. EXAM: CHEST  2 VIEW COMPARISON:  07/04/2014 FINDINGS: Low lung volumes accentuating the cardiac size and leading to bronchovascular crowding. No focal consolidation, pleural effusion or pneumothorax. There is degenerative  change in the spine. IMPRESSION: Low lung volumes without acute process. Electronically Signed   By: Rubye Oaks M.D.   On: 04/09/2015 01:08   I have personally reviewed and evaluated these images and lab results as part of my medical decision-making.   EKG Interpretation   Date/Time:  Friday April 08 2015 23:58:40 EDT Ventricular Rate:  82 PR Interval:  142 QRS Duration: 82 QT Interval:  382 QTC Calculation: 446 R Axis:   78 Text Interpretation:  Normal sinus rhythm Normal ECG since last tracing no  significant change Confirmed by Effie Shy  MD, Ayeden Gladman ([id number]) on 04/09/2015  2:39:44 AM      MDM   Final diagnoses:  Nonspecific chest pain  Secondary hypertension, unspecified    Nonspecific chest pain. Low risk for coronary artery disease. Delta troponin is negative. Doubt ACS, PE or pneumonia.  Nursing Notes Reviewed/ Care Coordinated Applicable Imaging Reviewed Interpretation of Laboratory Data incorporated into ED treatment  The patient appears reasonably screened and/or stabilized for discharge and I  doubt any other medical condition or other [facility] requiring further screening, evaluation, or treatment in the ED at this time prior to discharge.  Plan: Home Medications- OTC analgesia; Home Treatments- rest; return here if the recommended treatment, does not improve the symptoms; Recommended follow up- PCP checkup one or 2 weeks for further evaluation. Recommended to use resource guide to find a primary care doctor, and return here if needed, for problems.     I personally performed the services described in this documentation, which was scribed in my presence. The recorded information has been reviewed and is accurate.       Mancel BaleElliott Baird Polinski, MD 04/09/15 [phone]

## 2015-04-09 NOTE — Discharge Instructions (Signed)
Dolor de pecho inespecfico (Nonspecific Chest Pain) Suele ser difcil encontrar la causa del dolor de [place]. Siempre existe una posibilidad de que el dolor est relacionado con algo grave, como un infarto de miocardio o un cogulo sanguneo en los pulmones. Hay muchas enfermedades que no son potencialmente mortales que pueden causar dolor de [place]. Es importante que concurra a las visitas de control con el mdico.  CUIDADOS EN EL HOGAR  Si le recetaron antibiticos, asegrese de terminarlos, incluso si comienza a Actor.  Evite las [employer] causen dolor de [place].  No consuma ningn producto que contenga tabaco, lo que incluye cigarrillos, tabaco de Theatre manager o Administrator, Civil Service. Si necesita ayuda para dejar de fumar, consulte al mdico.  No beba alcohol.  Tome los medicamentos solamente como se lo haya indicado el mdico.  Concurra a todas las visitas de control como se lo haya indicado el mdico. Esto es importante. Esto incluye otros estudios si el dolor de pecho no desaparece.  El mdico puede indicarle que mantenga la cabeza levantada (elevada) mientras duerme.  Haga cambios en su estilo de vida segn las indicaciones del mdico. Estos pueden incluir lo siguiente:  Education administrator actividad fsica con regularidad. Pdale al mdico que le sugiera algunas actividades que sean seguras para usted.  Consumir una dieta cardiosaludable. El mdico o un especialista en alimentacin (nutricionista) pueden ayudarlo a que haga elecciones saludables.  Mantener un peso saludable.  Controlar la diabetes, si es necesario.  Reducir las situaciones de estrs. SOLICITE AYUDA SI:  El dolor de pecho no desaparece, incluso despus del tratamiento.  Tiene una erupcin cutnea con ampollas en el pecho.  Tiene fiebre. SOLICITE AYUDA DE INMEDIATO SI:  El dolor en el pecho es ms intenso.  La tos empeora, o expectora sangre.  Siente un dolor intenso en el vientre  (abdomen).  Se siente muy dbil.  Pierde el conocimiento (se desmaya).  Tiene escalofros.  Tiene una molestia repentina e inexplicable en el pecho.  Tiene molestias repentinas e [employer], la espalda, el cuello o la [place].  Le falta el aire en cualquier momento.  Comienza a sudar de [place] repentina o la piel se le humedece.  Siente nuseas.  Vomita.  Se siente repentinamente mareado o se desmaya.  Siente que el corazn comienza a latir rpidamente o que se saltea latidos. Estos sntomas pueden Customer service manager. No espere hasta que los sntomas desaparezcan. Solicite atencin mdica de inmediato. Comunquese con el servicio de emergencias de su localidad (911 en los [place]). No conduzca por sus propios medios [employer].   Esta informacin no tiene Theme park manager el consejo del mdico. Asegrese de hacerle al mdico cualquier pregunta que tenga.   Document Released: 03/16/2008 Document Revised: 01/08/2014 Elsevier Interactive Patient Education 2016 [employer].  Hipertensin (Hypertension) La hipertensin, conocida comnmente como presin arterial alta, se produce cuando la sangre bombea en las arterias con mucha fuerza. Las arterias son los vasos sanguneos que transportan la sangre desde el corazn hacia todas las partes del cuerpo. Una lectura de la presin arterial consiste en un nmero ms alto sobre un nmero ms bajo, por ejemplo, 110/72. El nmero ms alto (presin sistlica) corresponde a la presin interna de las arterias cuando el corazn [place]. El nmero ms bajo (presin diastlica) corresponde a la presin interna de las arterias cuando el corazn se relaja. En condiciones ideales, la presin arterial debe ser inferior a 120/80. La hipertensin fuerza al corazn a trabajar  ms para bombear la sangre. Las arterias pueden estrecharse o ponerse rgidas. La hipertensin no tratada o no controlada puede causar  infarto de miocardio, ictus, enfermedad renal y otros problemas. FACTORES DE RIESGO Algunos factores de riesgo de hipertensin son controlables, pero otros no lo son.  Entre los factores de riesgo que usted no puede Chief Operating Officercontrolar, se Baxter Internationalincluyen los siguientes:   La raza. El riesgo es mayor para las Statisticianpersonas afroamericanas.  La edad. Los riesgos aumentan con la edad.  El sexo. Antes de los 45aos, los hombres corren ms Goodyear Tireriesgo que las mujeres. Despus de los 65aos, las mujeres corren ms Lexmark Internationalriesgo que los hombres. Entre los factores de riesgo que usted puede Chief Operating Officercontrolar, se Baxter Internationalincluyen los siguientes:  No hacer la cantidad suficiente de actividad fsica o ejercicio.  Tener sobrepeso.  Consumir mucha grasa, azcar, caloras o sal en la dieta.  Beber alcohol en exceso. SIGNOS Y SNTOMAS Por lo general, la hipertensin no causa signos o sntomas. La hipertensin arterial demasiado alta (crisis hipertensiva) puede causar dolor de cabeza, ansiedad, falta de aire y hemorragia nasal. DIAGNSTICO Para detectar si usted tiene hipertensin, el mdico le medir la presin arterial mientras est sentado, con el brazo levantado a la altura del corazn. Debe medirla al Roosevelt Medical Centermenos dos veces en el mismo brazo. Determinadas condiciones pueden causar una diferencia de presin arterial entre el brazo izquierdo y Aeronautical engineerel derecho. El hecho de tener una sola lectura de la presin arterial ms alta que lo normal no significa que Research scientist (physical sciences)necesita un tratamiento. Si no est claro si tiene hipertensin arterial, es posible que se le pida que regrese otro da para volver a controlarle la presin arterial. O bien se le puede pedir que se controle la presin arterial en su casa durante 1 o ms meses. TRATAMIENTO El tratamiento de la hipertensin arterial incluye hacer cambios en el estilo de vida y, posiblemente, tomar medicamentos. Un estilo de vida saludable puede ayudar a bajar la presin arterial alta. Quiz deba cambiar algunos hbitos. Los  cambios en el estilo de vida pueden incluir lo siguiente:  Seguir la dieta DASH. Esta dieta tiene un alto contenido de frutas, verduras y Radiation protection practitionercereales integrales. Incluye poca cantidad de sal, carnes rojas y azcares agregados.  Mantenga el consumo de sodio por debajo de 2300 mg por da.  Realizar al BJ's Wholesalemenos entre 30 y 45 minutos de ejercicio Badger Leeaerbico, 4 veces por semana como mnimo.  Perder peso, si es necesario.  No fumar.  Limitar el consumo de bebidas alcohlicas.  Aprender formas de reducir el estrs. El mdico puede recetarle medicamentos si los cambios en el estilo de vida no son suficientes para Museum/gallery curatorlograr controlar la presin arterial y si una de las siguientes afirmaciones es verdadera:  Cleone Slimiene entre 18 y 3859 aos y su presin arterial sistlica est por encima de 140.  Tiene 60 aos o ms y su presin arterial sistlica est por encima de 150.  Su presin arterial diastlica est por encima de 90.  Tiene diabetes y su presin arterial sistlica est por encima de 140 o su presin arterial diastlica est por encima de 90.  Tiene una enfermedad renal y su presin arterial est por encima de 140/90.  Tiene una enfermedad cardaca y su presin arterial est por encima de 140/90. La presin arterial deseada puede variar en funcin de las enfermedades, la edad y otros factores personales. INSTRUCCIONES PARA EL CUIDADO EN EL HOGAR  Haga que le midan de nuevo la presin arterial segn las indicaciones del mdico.  Tregoome  los medicamentos solamente como se lo haya indicado el mdico. Siga cuidadosamente las indicaciones. Los medicamentos para la presin arterial deben tomarse segn las indicaciones. Los medicamentos pierden eficacia al omitir las dosis. El hecho de omitir las dosis tambin [place] el riesgo de otros problemas.  No fume.  Contrlese la presin arterial en su casa segn las indicaciones del mdico. SOLICITE ATENCIN MDICA SI:   Piensa que tiene una reaccin alrgica a  los medicamentos.  Tiene mareos o dolores de cabeza con Naval architect.  Tiene hinchazn en los tobillos.  Tiene problemas de visin. SOLICITE ATENCIN MDICA DE INMEDIATO SI:  Siente un dolor de cabeza intenso o confusin.  Siente debilidad inusual, adormecimiento o que Hospital doctor.  Siente dolor intenso en el pecho o en el abdomen.  Vomita repetidas veces.  Tiene dificultad para respirar. ASEGRESE DE QUE:   Comprende estas instrucciones.  Controlar su afeccin.  Recibir ayuda de inmediato si no mejora o si empeora.   Esta informacin no tiene Theme park manager el consejo del mdico. Asegrese de hacerle al mdico cualquier pregunta que tenga.   Document Released: 12/18/2004 Document Revised: 05/04/2014 Elsevier Interactive Patient Education [employer]

## 2015-04-12 ENCOUNTER — Ambulatory Visit (INDEPENDENT_AMBULATORY_CARE_PROVIDER_SITE_OTHER): Payer: BLUE CROSS/BLUE SHIELD | Admitting: Family Medicine

## 2015-04-12 VITALS — BP 138/90 | HR 68 | Temp 98.3°F | Resp 16 | Ht 63.0 in | Wt 168.4 lb

## 2015-04-12 DIAGNOSIS — I10 Essential (primary) hypertension: Secondary | ICD-10-CM

## 2015-04-12 DIAGNOSIS — S29019A Strain of muscle and tendon of unspecified wall of thorax, initial encounter: Secondary | ICD-10-CM | POA: Diagnosis not present

## 2015-04-12 DIAGNOSIS — S2341XA Sprain of ribs, initial encounter: Secondary | ICD-10-CM

## 2015-04-12 DIAGNOSIS — K219 Gastro-esophageal reflux disease without esophagitis: Secondary | ICD-10-CM | POA: Diagnosis not present

## 2015-04-12 MED ORDER — AMLODIPINE BESYLATE 5 MG PO TABS: 5.0000 mg | ORAL_TABLET | Freq: Every day | ORAL | Status: DC

## 2015-04-12 MED ORDER — PREDNISONE 20 MG PO TABS: ORAL_TABLET | ORAL | Status: DC

## 2015-04-12 NOTE — Progress Notes (Signed)
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 He had a couple of falls but none that he considers severe. He's had the pain for over 5 days. The pain is located in the left side of his chest is made worse by twisting. He's having no bowel symptoms, no shortness of breath, no sweating.  Patient was seen in the emergency room 4 days ago and they hold Friday of evaluations or undertaken. He was told at the end to take Tylenol.  Objective:BP 138/90 mmHg  Pulse 68  Temp(Src) 98.3 F (36.8 C) (Oral)  Resp 16  Ht 5\' 3"  (1.6 m)  Wt 168 lb 6.4 oz (76.386 kg)  BMI 29.84 kg/m2  SpO2 98% HEENT: Unremarkable Chest: Nontender, clear to auscultation, no rash, no swelling or bony deformity Heart: Regular no murmur Abdomen: Soft nontender without HSM or masses Skin: No rash  We discussed specks of blood pressure was still elevated despite taking his lisinopril.  Assessment: Poorly controlled blood pressure, inflammatory type pain in the chest wall with possible reflux  Essential hypertension - Plan: amLODipine (NORVASC) 5 MG tablet  Sprain and strain of ribs, initial encounter - Plan: predniSONE (DELTASONE) 20 MG tablet  Gastroesophageal reflux disease without esophagitis  Elvina SidleKurt Elleana Stillson, MD

## 2015-04-12 NOTE — Patient Instructions (Addendum)
Dolor en la pared torácica °(Chest Wall Pain) °El dolor en la pared torácica se produce en los huesos y los músculos del pecho o alrededor de estos. A veces, una lesión causa este dolor. En ocasiones, la causa puede ser desconocida. Este dolor puede durar varias semanas. °INSTRUCCIONES PARA EL CUIDADO EN EL HOGAR  °Esté atento a cualquier cambio en los síntomas. Tome estas medidas para aliviar el dolor:  °· Haga reposo como se lo haya indicado el médico.   °· Evite las actividades que causan dolor. Estas pueden ser aquellas que requieren el uso de los músculos del tórax, los abdominales o los laterales para levantar objetos pesados.    °· Si se lo indican, aplique hielo sobre la zona dolorida: °¨ Ponga el hielo en una bolsa plástica. °¨ Coloque una toalla entre la piel y la bolsa de hielo. °¨ Coloque el hielo durante 20 minutos, 2 a 3 veces por día. °· Tome los medicamentos de venta libre y los recetados solamente como se lo haya indicado el médico. °· No consuma productos que contengan tabaco, incluidos cigarrillos, tabaco de mascar y cigarrillos electrónicos. Si necesita ayuda para dejar de fumar, consulte al médico. °· Concurra a todas las visitas de control como se lo haya indicado el médico. Esto es importante. °SOLICITE ATENCIÓN MÉDICA SI: °· Tiene fiebre. °· El dolor de pecho empeora. °· Aparecen nuevos síntomas. °SOLICITE ATENCIÓN MÉDICA DE INMEDIATO SI: °· Tiene náuseas o vómitos. °· Transpira o tiene sensación de desvanecimiento. °· Tiene tos con flema (esputo) o expectora sangre al toser. °· Le falta el aire. °  °Esta información no tiene como fin reemplazar el consejo del médico. Asegúrese de hacerle al médico cualquier pregunta que tenga. °  °Document Released: 01/29/2006 Document Revised: 09/08/2014 °Elsevier Interactive Patient Education ©2016 Elsevier Inc. ° °

## 2015-06-06 ENCOUNTER — Emergency Department (HOSPITAL_COMMUNITY): Payer: BLUE CROSS/BLUE SHIELD

## 2015-06-06 ENCOUNTER — Emergency Department (HOSPITAL_COMMUNITY)
Admission: EM | Admit: 2015-06-06 | Discharge: 2015-06-06 | Disposition: A | Discharge status: Home / Self Care | Payer: BLUE CROSS/BLUE SHIELD | Attending: Emergency Medicine | Admitting: Emergency Medicine

## 2015-06-06 ENCOUNTER — Encounter (HOSPITAL_COMMUNITY): Payer: Self-pay | Admitting: Emergency Medicine

## 2015-06-06 DIAGNOSIS — Z8719 Personal history of other diseases of the digestive system: Secondary | ICD-10-CM | POA: Diagnosis not present

## 2015-06-06 DIAGNOSIS — I10 Essential (primary) hypertension: Secondary | ICD-10-CM | POA: Insufficient documentation

## 2015-06-06 DIAGNOSIS — E785 Hyperlipidemia, unspecified: Secondary | ICD-10-CM | POA: Diagnosis not present

## 2015-06-06 DIAGNOSIS — Z7952 Long term (current) use of systemic steroids: Secondary | ICD-10-CM | POA: Insufficient documentation

## 2015-06-06 DIAGNOSIS — Z79899 Other long term (current) drug therapy: Secondary | ICD-10-CM | POA: Insufficient documentation

## 2015-06-06 DIAGNOSIS — R5383 Other fatigue: Secondary | ICD-10-CM | POA: Diagnosis not present

## 2015-06-06 DIAGNOSIS — R251 Tremor, unspecified: Secondary | ICD-10-CM | POA: Diagnosis not present

## 2015-06-06 LAB — CBC WITH DIFFERENTIAL/PLATELET
Basophils Absolute: 0 10*3/uL (ref 0.0–0.1)
Basophils Relative: 0 %
Eosinophils Absolute: 0 10*3/uL (ref 0.0–0.7)
Eosinophils Relative: 0 %
HCT: 49.6 % (ref 39.0–52.0)
Hemoglobin: 17.2 g/dL — ABNORMAL HIGH (ref 13.0–17.0)
Lymphocytes Relative: 14 %
Lymphs Abs: 1.4 10*3/uL (ref 0.7–4.0)
MCH: 29.7 pg (ref 26.0–34.0)
MCHC: 34.7 g/dL (ref 30.0–36.0)
MCV: 85.5 fL (ref 78.0–100.0)
Monocytes Absolute: 0.6 10*3/uL (ref 0.1–1.0)
Monocytes Relative: 6 %
Neutro Abs: 7.7 10*3/uL (ref 1.7–7.7)
Neutrophils Relative %: 80 %
Platelets: 228 10*3/uL (ref 150–400)
RBC: 5.8 MIL/uL (ref 4.22–5.81)
RDW: 13.6 % (ref 11.5–15.5)
WBC: 9.8 10*3/uL (ref 4.0–10.5)

## 2015-06-06 LAB — COMPREHENSIVE METABOLIC PANEL
ALT: 38 U/L (ref 17–63)
AST: 31 U/L (ref 15–41)
Albumin: 4 g/dL (ref 3.5–5.0)
Alkaline Phosphatase: 51 U/L (ref 38–126)
Anion gap: 7 (ref 5–15)
BUN: 16 mg/dL (ref 6–20)
CO2: 26 mmol/L (ref 22–32)
Calcium: 9.3 mg/dL (ref 8.9–10.3)
Chloride: 106 mmol/L (ref 101–111)
Creatinine, Ser: 0.96 mg/dL (ref 0.61–1.24)
GFR calc Af Amer: >60 mL/min (ref >60)
GFR calc non Af Amer: >60 mL/min (ref >60)
Glucose, Bld: 134 mg/dL — ABNORMAL HIGH (ref 65–99)
Potassium: 3.8 mmol/L (ref 3.5–5.1)
Sodium: 139 mmol/L (ref 135–145)
Total Bilirubin: 0.5 mg/dL (ref 0.3–1.2)
Total Protein: 7.4 g/dL (ref 6.5–8.1)

## 2015-06-06 LAB — URINALYSIS, DIPSTICK ONLY
Bilirubin Urine: NEGATIVE
Glucose, UA: NEGATIVE mg/dL
Ketones, ur: NEGATIVE mg/dL
Leukocytes, UA: NEGATIVE
Nitrite: NEGATIVE
Protein, ur: NEGATIVE mg/dL
Specific Gravity, Urine: 1.029 (ref 1.005–1.030)
pH: 6 (ref 5.0–8.0)

## 2015-06-06 LAB — TSH: TSH: 0.571 u[IU]/mL (ref 0.350–4.500)

## 2015-06-06 NOTE — ED Provider Notes (Signed)
CSN: [id number]     Arrival date & time 06/06/15  0865 History   First MD Initiated Contact with Patient 06/06/15 0845     Chief Complaint  Patient presents with  . Fatigue  . Tremors   I am I a bilingual certified provider. History and physical obtained in Spanish as preferred by the patient and family.  HPI Patient presents to the emergency Department by private vehicle with family. Patient had [facility] to [facility] right [facility] and was discharged the same day with a prescription for Norco which patient states that he seldom taken as he has minimal pain. He states the forearm is feeling well and that he is otherwise in his usual state of health except for feeling some tremors and sweating. He states that he's had this in the past when he had his gallbladder removed. He states he feels tremors and sweating intermittently and he clenches his jaw with this. No fevers he does not identified this as chills. He denies any incontinence or biting his tongue or losing consciousness or a postictal state related to these. No history of diabetes and does not have any insulin that he takes. He does not have any thyroid disorders. He denies any chest pain or shortness of breath or dysuria. Patient states that due to the severity of the symptoms he presented here for further evaluation. He has not tried anything to make the symptoms better. He has not noticed anything makes it better or worse. He states that they last 30 minutes or so. His wife at the bedside state that he is anxious person and tends to think about things a lot and that this appears to trigger  Past Medical History  Diagnosis Date  . Hyperlipidemia   . Hypertension   . Numbness and tingling in hands     Hx: of  . Headache(784.0)     Hx: of when BP is elevated  . Bile duct leak    Past Surgical History  Procedure Laterality Date  . Cholecystectomy N/A 07/11/2012    Procedure: LAPAROSCOPIC  CHOLECYSTECTOMY WITH INTRAOPERATIVE CHOLANGIOGRAM;  Surgeon: Axel Filler, MD;  Location: [facility] OR;  Service: General;  Laterality: N/A;  . Ercp  07/16/2012  . Ercp N/A 07/16/2012    Procedure: ENDOSCOPIC RETROGRADE CHOLANGIOPANCREATOGRAPHY (ERCP);  Surgeon: Louis Meckel, MD;  Location: [facility] OR;  Service: Gastroenterology;  Laterality: N/A;  . Ercp N/A 10/06/2012    Procedure: ENDOSCOPIC RETROGRADE CHOLANGIOPANCREATOGRAPHY (ERCP);  Surgeon: Louis Meckel, MD;  Location: [facility];  Service: Endoscopy;  Laterality: N/A;  . Colonoscopy     Family History  Problem Relation Age of Onset  . Gallstones Mother   . Diabetes Paternal Uncle    Social History  Substance Use Topics  . Smoking status: Never Smoker   . Smokeless tobacco: Never Used  . Alcohol Use: No    Review of Systems  Constitutional: Negative for fever.  Allergic/Immunologic: Negative for immunocompromised state.  All other systems reviewed and are negative.     Allergies  Review of patient's allergies indicates no known allergies.  Home Medications   Prior to Admission medications   Medication Sig Start Date End Date Taking? Authorizing Provider  amLODipine (NORVASC) 5 MG tablet Take 1 tablet (5 mg total) by mouth daily. 04/12/15   Elvina Sidle, MD  lovastatin (MEVACOR) 20 MG tablet Take 1 tablet (20 mg total) by mouth at bedtime. 09/03/14   Pearline Cables, MD  predniSONE (DELTASONE) 20 MG tablet Two daily with food 04/12/15   Elvina SidleKurt Lauenstein, MD   BP 139/96 mmHg  Pulse 88  Temp(Src) 99.1 F (37.3 C) (Oral)  Resp 19  SpO2 92% Physical Exam  Constitutional: He appears well-developed and well-nourished. No distress.  HENT:  Head: Normocephalic and atraumatic.  Left Ear: External ear normal.  Eyes: Conjunctivae are normal. Pupils are equal, round, and reactive to light. Right eye exhibits no discharge. Left eye exhibits no discharge.  Neck: Normal range of motion. Neck supple.  Cardiovascular: Normal rate  and regular rhythm.   No murmur heard. Pulmonary/Chest: Effort normal and breath sounds normal. No respiratory distress.  Abdominal: Soft. Bowel sounds are normal. He exhibits no distension and no mass. There is no tenderness. There is no rebound and no guarding.  Musculoskeletal:  Right upper extremity without significant swelling, full ROM of fingers, no cyanosis, <2 sec cap refill, normal sensation. Mild swelling. Nontender. [facility]bow [facility]  Neurological: He is alert.  Skin: Skin is warm. He is not diaphoretic.  Psychiatric: He has a normal mood and affect.    ED Course  Procedures (including critical care time) Labs Review Labs Reviewed  CBC WITH DIFFERENTIAL/PLATELET - Abnormal; Notable for the following:    Hemoglobin 17.2 (*)    All other components within normal limits  COMPREHENSIVE METABOLIC PANEL - Abnormal; Notable for the following:    Glucose, Bld 134 (*)    All other components within normal limits  URINALYSIS, DIPSTICK ONLY - Abnormal; Notable for the following:    Hgb urine dipstick SMALL (*)    All other components within normal limits  TSH    Imaging Review Dg Chest Port 1 View  06/06/2015  CLINICAL DATA:  Fatigue, chills since Saturday EXAM: PORTABLE CHEST 1 VIEW COMPARISON:  04/09/2015 FINDINGS: Heart and mediastinal contours are within normal limits. No focal opacities or effusions. No acute bony abnormality. IMPRESSION: No active disease. Electronically Signed   By: Charlett NoseKevin  Dover M.D.   On: 06/06/2015 09:15   I have personally reviewed and evaluated these images and lab results as part of my medical decision-making.   EKG Interpretation None      MDM   Final diagnoses:  Occasional tremors    Symptoms are not consistent with a seizure due to lack of postictal episode, bilateral symptoms, and full consciousness during events. Concern for a possible electrolyte abnormalities were CMP obtained. Patient denies any vomiting, diarrhea or other  trigger. Will obtain a TSH given the fatigue and anxiety. Patient denies any suicidal ideation or depressive symptoms. Chest x-ray and UA obtained given possibility that the symptoms are chills and infectious, although patient is afebrile here with reassuring vital signs the patient denies any infectious symptoms otherwise. Pending results.  Urinalysis, chest x-ray, TSH, CMP and CBC unremarkable. Discussed results with family and patient. Patient instructed to return for altered mental status, focal neurological deficits, confusion, severe pain, or other concerning symptoms. Patient discharged in good condition with follow-up with primary care provider.    Sidney AceAlison Charruf Yvana Samonte, MD 06/06/15 1049  Cathren LaineKevin Steinl, MD 06/06/15 [id number]

## 2015-06-06 NOTE — Discharge Instructions (Signed)
Fatiga  (Fatigue)  La fatiga es una sensación de cansancio en todo momento, falta de energía o falta de motivación. La fatiga ocasional o leve con frecuencia es una reacción normal a la actividad o la vida en general. Sin embargo, la fatiga de larga duración (crónica) o extrema puede indicar una enfermedad preexistente.  INSTRUCCIONES PARA EL CUIDADO EN EL HOGAR   Controle su fatiga para ver si hay cambios. Las siguientes indicaciones ayudarán a aliviar cualquier molestia que pueda sentir:  · Hable con el médico acerca de cuánto debe dormir cada noche. Trate de dormir la cantidad de tiempo requerida todas las noches.  · Tome los medicamentos solamente como se lo haya indicado el médico.  · Siga una dieta saludable y nutritiva. Pida ayuda al médico si necesita hacer cambios en su dieta.  · Beba suficiente líquido para mantener la orina clara o de color amarillo pálido.  · Practique actividades que lo relajen, como yoga, meditación, terapia de masajes o acupuntura.  · Haga ejercicios regularmente.  · Cambie las situaciones que le provocan estrés. Trate de que su rutina de trabajo y personal sea moderada.  · No consuma drogas.  · Limite el consumo de alcohol a no más de 1 medida por día si es mujer y no está embarazada, y 2 medidas si es hombre. Una medida equivale a 12 onzas de cerveza, 5 onzas de vino o 1½ onzas de bebidas alcohólicas de alta graduación.  · Tome una multivitamina, si se lo indicó el médico.  SOLICITE ATENCIÓN MÉDICA SI:   · La fatiga no mejora.  · Tiene fiebre.  · Tiene pérdida o aumento involuntario de peso.  · Tiene dolores de cabeza.  · Tiene dificultad para:  ¨ Dormirse.  ¨ Dormir durante toda la noche.  · Se siente enojado, culpable, ansioso o triste.  · No puede defecar (estreñimiento).  · Tiene la piel seca.  · Tiene hinchadas las piernas u otra parte del cuerpo.  SOLICITE ATENCIÓN MÉDICA DE INMEDIATO SI:   · Se siente confundido.  · Tiene visión borrosa.  · Sufre mareos o se desmaya.  · Sufre  un dolor intenso de cabeza.  · Siente dolor intenso en el abdomen, la pelvis o la espalda.  · Tiene dolor de pecho, dificultad para respirar, o latidos cardíacos irregulares o acelerados.  · No puede orinar u orina menos de lo normal.  · Presenta sangrado anormal, como sangrado del recto, la vagina, la nariz, los pulmones o los pezones.  · Vomita sangre.  · Tiene pensamientos acerca de hacerse daño a sí mismo o cometer suicidio.  · Le preocupa la posibilidad de hacerle daño a otra persona.     Esta información no tiene como fin reemplazar el consejo del médico. Asegúrese de hacerle al médico cualquier pregunta que tenga.     Document Released: 04/05/2008 Document Revised: 01/08/2014  Elsevier Interactive Patient Education ©2016 Elsevier Inc.

## 2015-06-06 NOTE — ED Notes (Signed)
Pt to ER by private vehicle with family. Pt reports [facility] Pt was discharged home same day with script for pain medication, which patient reports taking. Pt reports yesterday began feeling generally fatigued and weak and noticed to be having tremors and sweating. Pt denies pain to arm. Denies foul odor. Denies fevers. Cast in place to right arm. Able to freely move fingers, denies numbness, tingling, etc. Color WDL. A/o x4.

## 2015-07-06 ENCOUNTER — Encounter: Payer: Self-pay | Admitting: Physician Assistant

## 2015-07-06 ENCOUNTER — Ambulatory Visit (INDEPENDENT_AMBULATORY_CARE_PROVIDER_SITE_OTHER): Payer: BLUE CROSS/BLUE SHIELD | Admitting: Physician Assistant

## 2015-07-06 VITALS — BP 130/94 | HR 91 | Temp 98.4°F | Resp 16 | Ht 62.0 in | Wt 164.0 lb

## 2015-07-06 DIAGNOSIS — E785 Hyperlipidemia, unspecified: Secondary | ICD-10-CM | POA: Diagnosis not present

## 2015-07-06 DIAGNOSIS — R319 Hematuria, unspecified: Secondary | ICD-10-CM | POA: Diagnosis not present

## 2015-07-06 DIAGNOSIS — I10 Essential (primary) hypertension: Secondary | ICD-10-CM

## 2015-07-06 DIAGNOSIS — R197 Diarrhea, unspecified: Secondary | ICD-10-CM

## 2015-07-06 DIAGNOSIS — R110 Nausea: Secondary | ICD-10-CM

## 2015-07-06 DIAGNOSIS — R51 Headache: Secondary | ICD-10-CM

## 2015-07-06 DIAGNOSIS — R809 Proteinuria, unspecified: Secondary | ICD-10-CM

## 2015-07-06 DIAGNOSIS — R519 Headache, unspecified: Secondary | ICD-10-CM

## 2015-07-06 LAB — POCT CBC
Granulocyte percent: 68.1 %G (ref 37–80)
HCT, POC: 54.6 % — AB (ref 43.5–53.7)
Hemoglobin: 18 g/dL (ref 14.1–18.1)
Lymph, poc: 2.1 (ref 0.6–3.4)
MCH, POC: 28.1 pg (ref 27–31.2)
MCHC: 33 g/dL (ref 31.8–35.4)
MCV: 85.1 fL (ref 80–97)
MID (cbc): 0.6 (ref 0–0.9)
MPV: 8.1 fL (ref 0–99.8)
POC Granulocyte: 5.7 (ref 2–6.9)
POC LYMPH PERCENT: 24.6 %L (ref 10–50)
POC MID %: 7.3 %M (ref 0–12)
Platelet Count, POC: 441 10*3/uL — AB (ref 142–424)
RBC: 6.41 M/uL — AB (ref 4.69–6.13)
RDW, POC: 13.8 %
WBC: 8.4 10*3/uL (ref 4.6–10.2)

## 2015-07-06 LAB — LIPID PANEL
Cholesterol: 311 mg/dL — ABNORMAL HIGH (ref 125–200)
HDL: 47 mg/dL (ref >=40)
LDL Cholesterol: 196 mg/dL — ABNORMAL HIGH (ref <130)
Total CHOL/HDL Ratio: 6.6 Ratio — ABNORMAL HIGH (ref <=5.0)
Triglycerides: 342 mg/dL — ABNORMAL HIGH (ref <150)
VLDL: 68 mg/dL — ABNORMAL HIGH (ref <30)

## 2015-07-06 LAB — POCT URINALYSIS DIP (MANUAL ENTRY)
Bilirubin, UA: NEGATIVE
Glucose, UA: NEGATIVE
Ketones, POC UA: NEGATIVE
Leukocytes, UA: NEGATIVE
Nitrite, UA: NEGATIVE
Protein Ur, POC: 100 — AB
Spec Grav, UA: >=1.03
Urobilinogen, UA: 0.2
pH, UA: 6

## 2015-07-06 LAB — COMPREHENSIVE METABOLIC PANEL
ALT: 45 U/L (ref 9–46)
AST: 31 U/L (ref 10–35)
Albumin: 4.8 g/dL (ref 3.6–5.1)
Alkaline Phosphatase: 58 U/L (ref 40–115)
BUN: 14 mg/dL (ref 7–25)
CO2: 26 mmol/L (ref 20–31)
Calcium: 9.8 mg/dL (ref 8.6–10.3)
Chloride: 102 mmol/L (ref 98–110)
Creat: 0.93 mg/dL (ref 0.70–1.33)
Glucose, Bld: 101 mg/dL — ABNORMAL HIGH (ref 65–99)
Potassium: 3.2 mmol/L — ABNORMAL LOW (ref 3.5–5.3)
Sodium: 141 mmol/L (ref 135–146)
Total Bilirubin: 0.7 mg/dL (ref 0.2–1.2)
Total Protein: 8 g/dL (ref 6.1–8.1)

## 2015-07-06 LAB — POC MICROSCOPIC URINALYSIS (UMFC): Mucus: ABSENT

## 2015-07-06 MED ORDER — LISINOPRIL 20 MG PO TABS: 20.0000 mg | ORAL_TABLET | Freq: Every day | ORAL | Status: DC

## 2015-07-06 MED ORDER — RANITIDINE HCL 150 MG PO TABS: 150.0000 mg | ORAL_TABLET | Freq: Two times a day (BID) | ORAL | Status: DC

## 2015-07-06 NOTE — Progress Notes (Signed)
Subjective:    Patient ID: Ethan Silva, male    DOB: 08-Sep-1965, 50 y.o.   MRN: [id number]  Chief Complaint  Patient presents with  . stomach pain    x 4 days  . diarrhea   HPI  Patient is a 50 yo male who presents today with complaints of diarrhea x 4days. States 4 days ago he ate a spicy chorizo, soon followed acute abdominal pain and diarrhea.  He has taken peptobismol with minimal relief.  Admits to heartburn, nausea, and last night an episode of palpitations felt in the abdomen and his head when lying down.    Denies chest pain, fever, abdominal pain, hematchezia, black tarry stool and hemoptysis.  Complains of headache, and dizziness with standing and sudden head movements x 1year.                                                                                                                                    Compliant with medications   Review of Systems All others negative except those listed in HPI.  Patient Active Problem List   Diagnosis Date Noted  . HTN (hypertension) 10/11/2012   Current Outpatient Prescriptions on File Prior to Visit  Medication Sig Dispense Refill  . lovastatin (MEVACOR) 20 MG tablet Take 1 tablet (20 mg total) by mouth at bedtime. (Patient not taking: Reported on 07/06/2015) 90 tablet 0   No current facility-administered medications on file prior to visit.    No Known Allergies       Objective:   Physical Exam  Constitutional: He is oriented to person, place, and time. He appears well-developed and well-nourished.  Neck: Normal range of motion. Neck supple. No tracheal deviation present. No thyromegaly present.  Cardiovascular: Normal rate, regular rhythm, normal heart sounds and intact distal pulses.  Exam reveals no gallop and no friction rub.   No murmur heard. Pulmonary/Chest: Effort normal and breath sounds normal. No respiratory distress. He has no wheezes. He has no rales. He exhibits no tenderness.  Abdominal: Soft. He  exhibits no distension and no mass. Bowel sounds are increased. There is tenderness in the epigastric area, periumbilical area and left upper quadrant. There is guarding. There is no rigidity, no rebound, no tenderness at McBurney's point and negative Murphy's sign.  Lymphadenopathy:    He has no cervical adenopathy.  Neurological: He is alert and oriented to person, place, and time.  Skin: Skin is warm and dry. He is not diaphoretic.  Psychiatric: He has a normal mood and affect. His behavior is normal. Judgment and thought content normal.     Results for orders placed or performed in visit on 07/06/15  POCT urinalysis dipstick  Result Value Ref Range   Color, UA yellow yellow   Clarity, UA clear clear   Glucose, UA negative negative   Bilirubin, UA negative negative   Ketones, POC UA negative negative   Spec Grav,  UA >=1.030    Blood, UA moderate (A) negative   pH, UA 6.0    Protein Ur, POC =100 (A) negative   Urobilinogen, UA 0.2    Nitrite, UA Negative Negative   Leukocytes, UA Negative Negative  POCT Microscopic Urinalysis ([facility])  Result Value Ref Range   WBC,UR,HPF,POC None None WBC/hpf   RBC,UR,HPF,POC None None RBC/hpf   Bacteria None None, Too numerous to count   Mucus Absent Absent   Epithelial Cells, UR Per Microscopy Few (A) None, Too numerous to count cells/hpf  POCT CBC  Result Value Ref Range   WBC 8.4 4.6 - 10.2 K/uL   Lymph, poc 2.1 0.6 - 3.4   POC LYMPH PERCENT 24.6 10 - 50 %L   MID (cbc) 0.6 0 - 0.9   POC MID % 7.3 0 - 12 %M   POC Granulocyte 5.7 2 - 6.9   Granulocyte percent 68.1 37 - 80 %G   RBC 6.41 (A) 4.69 - 6.13 M/uL   Hemoglobin 18.0 14.1 - 18.1 g/dL   HCT, POC 16.154.6 (A) 09.643.5 - 53.7 %   MCV 85.1 80 - 97 fL   MCH, POC 28.1 27 - 31.2 pg   MCHC 33.0 31.8 - 35.4 g/dL   RDW, POC 04.513.8 %   Platelet Count, POC 441 (A) 142 - 424 K/uL   MPV 8.1 0 - 99.8 fL        Assessment & Plan:  1. Diarrhea, unspecified type  CBC does not indicate infectious  origin.    Other labs pending, will address any abnormalities noted.  Patient told if diarrhea continues for more than 2 weeks to return for stool culture and further evaluation. - Comprehensive metabolic panel - Lipid panel - POCT CBC  2. Nausea without vomiting  Likely due to reflux.  Trial of Zantac initiated.  Patient instructed to take medication twice daily.    If symptoms persist will re-evaluated etiology. - ranitidine (ZANTAC) 150 MG tablet; Take 1 tablet (150 mg total) by mouth 2 (two) times daily.  Dispense: 60 tablet; Refill: 0  3. Headache, unspecified headache type Stable.  Likely due to uncontrolled hypertension.  If worsen patient instructed to return to office.  4. Essential hypertension  Uncontrolled.  Patient currently not taking medication as prescribed.  Discontinued amlodipine due to lower extremity swelling.    Started patient back on lisinopril at lower dosage of 20mg .    Patient instructed to follow up in 6 weeks to assess management. - POCT urinalysis dipstick - POCT Microscopic Urinalysis ([facility]) - lisinopril (PRINIVIL,ZESTRIL) 20 MG tablet; Take 1 tablet (20 mg total) by mouth daily.  Dispense: 90 tablet; Refill: 3  5. Proteinuria 6. Hematuria Noted on urinalysis today.  Will recheck at 6 week follow up and manage as necessary.  Patient instructed to follow up in 6 weeks for annual physical exam and hypertension management.  Rasheen Bells P. Penniner, PA-S

## 2015-07-06 NOTE — Patient Instructions (Signed)
     IF you received an x-ray today, you will receive an invoice from [facility]. Please contact [facility] at [phone] with questions or concerns regarding your invoice.   IF you received labwork today, you will receive an invoice from [facility]. Please contact [facility] at [phone] with questions or concerns regarding your invoice.   Our billing staff will not be able to assist you with questions regarding bills from these companies.  You will be contacted with the lab results as soon as they are available. The fastest way to get your results is to activate your My Chart account. Instructions are located on the last page of this paperwork. If you have not heard from us regarding the results in 2 weeks, please contact this office.      

## 2015-07-06 NOTE — Progress Notes (Signed)
Patient ID: Ethan Silva, male    DOB: 22-Jul-1965, 50 y.o.   MRN: [id number]  PCP: Dorrene GermanEdwin A Avbuere, MD  Subjective:   Chief Complaint  Patient presents with  . stomach pain    x 4 days  . diarrhea    HPI Presents for evaluation of abdominal pain and diarrhea x 4 days. He is accompanied by a Nurse, learning disabilitytranslator.  In addition, he needs refills of his medications and his wife would like him to have a PSA test..  His current symptoms began following consumption of a spicy chorizo. The abdominal pain has significantly improved (to his baseline, he has chronic peri-umbilical pain, even following cholecystectomy), but he continues to have diarrhea 3-4 times each day. Pepto-bismol has helped. No blood or mucous in the stool.  He describes heartburn and nausea, and reports an episode last night where he experienced palpitations in the upper abdomen and head when he was lying down. Reports headache and brief dizziness with rapid position change for the past year. Chart review reveals similar complaint in 2015 as well.  No fever, chills, hematochezia or melena.  There is some confusion regarding his medications. He has not taken Mevacor for 6 months. At some point he was switched from lisinopril 40 mg to amlodipine 5 mg, but he doesn't know why. Last Rx was 09/2014 for a 30-day supply of lisinopril. It appears that amlodipine was started on 04/12/2015, but there is not documentation in the chart regarding the rationale for the change.     Review of Systems As above.    Patient Active Problem List   Diagnosis Date Noted  . HTN (hypertension) 10/11/2012     CURRENT MEDICATIONS: Amlodipine 5 mg 1 PO QD    No Known Allergies     Objective:  Physical Exam  Constitutional: He is oriented to person, place, and time. He appears well-developed and well-nourished. He is active and cooperative. No distress.  BP 130/94 mmHg  Pulse 91  Temp(Src) 98.4 F (36.9 C) (Oral)  Resp 16  Ht 5'  2" (1.575 m)  Wt 164 lb (74.39 kg)  BMI 29.99 kg/m2  SpO2 95%  HENT:  Head: Normocephalic and atraumatic.  Right Ear: Hearing normal.  Left Ear: Hearing normal.  Eyes: Conjunctivae are normal. No scleral icterus.  Neck: Normal range of motion. Neck supple. No thyromegaly present.  Cardiovascular: Normal rate, regular rhythm and normal heart sounds.   Pulses:      Radial pulses are 2+ on the right side, and 2+ on the left side.  1+ pitting edema bilateral LE  Pulmonary/Chest: Effort normal and breath sounds normal.  Abdominal: Normal appearance and bowel sounds are normal. He exhibits no distension, no ascites and no mass. There is no hepatosplenomegaly. There is tenderness (peri-umilical and lower quadrant tenderness is his baseline) in the right lower quadrant, epigastric area, periumbilical area, suprapubic area and left lower quadrant.  Lymphadenopathy:       Head (right side): No tonsillar, no preauricular, no posterior auricular and no occipital adenopathy present.       Head (left side): No tonsillar, no preauricular, no posterior auricular and no occipital adenopathy present.    He has no cervical adenopathy.       Right: No supraclavicular adenopathy present.       Left: No supraclavicular adenopathy present.  Neurological: He is alert and oriented to person, place, and time. No sensory deficit.  Skin: Skin is warm, dry and intact. No rash noted. No  cyanosis or erythema. Nails show no clubbing.  Psychiatric: He has a normal mood and affect. His speech is normal and behavior is normal.           Assessment & Plan:   1. Diarrhea, unspecified type Possibly viral vs food-borne. Hydrate.  - Comprehensive metabolic panel - Lipid panel - POCT CBC  2. Nausea without vomiting He may have GERD. Trial of ranitidine. If symptoms persist, would check H pylori. - ranitidine (ZANTAC) 150 MG tablet; Take 1 tablet (150 mg total) by mouth 2 (two) times daily.  Dispense: 60 tablet;  Refill: 0  3. Headache, unspecified headache type Unclear etiology. Possibly due to uncontrolled HTN? Possibly due to current illness?  4. Essential hypertension He has a little bit of LE edema. Stop amlodipine. Restart lisinopril but at 20 mg instead of 40 mg. Re-evaluate in 6 weeks. - POCT urinalysis dipstick - POCT Microscopic Urinalysis ([facility]) - lisinopril (PRINIVIL,ZESTRIL) 20 MG tablet; Take 1 tablet (20 mg total) by mouth daily.  Dispense: 90 tablet; Refill: 3  5. Proteinuria 6. Hematuria Possibly due to uncontrolled HTN. Await remaining labs. Recheck at 6 week follow-up visit, along with PSA, lipids.   Fernande Brashelle S. Reis Goga, PA-C Physician Assistant-Certified [facility]

## 2015-07-08 MED ORDER — LOVASTATIN 20 MG PO TABS: 20.0000 mg | ORAL_TABLET | Freq: Every day | ORAL | Status: DC

## 2015-07-08 NOTE — Addendum Note (Signed)
Addended by: Fernande BrasJEFFERY, Fenix Ruppe S on: 07/08/2015 11:12 AM   Modules accepted: Orders

## 2015-08-30 ENCOUNTER — Ambulatory Visit (INDEPENDENT_AMBULATORY_CARE_PROVIDER_SITE_OTHER): Payer: BLUE CROSS/BLUE SHIELD | Admitting: Physician Assistant

## 2015-08-30 ENCOUNTER — Encounter: Payer: Self-pay | Admitting: Physician Assistant

## 2015-08-30 VITALS — BP 140/82 | HR 64 | Temp 98.2°F | Resp 18 | Ht 62.0 in | Wt 169.0 lb

## 2015-08-30 DIAGNOSIS — Z125 Encounter for screening for malignant neoplasm of prostate: Secondary | ICD-10-CM

## 2015-08-30 DIAGNOSIS — K439 Ventral hernia without obstruction or gangrene: Secondary | ICD-10-CM

## 2015-08-30 DIAGNOSIS — E785 Hyperlipidemia, unspecified: Secondary | ICD-10-CM | POA: Insufficient documentation

## 2015-08-30 DIAGNOSIS — Z0000 Encounter for general adult medical examination without abnormal findings: Secondary | ICD-10-CM | POA: Diagnosis not present

## 2015-08-30 DIAGNOSIS — Z6830 Body mass index (BMI) 30.0-30.9, adult: Secondary | ICD-10-CM | POA: Diagnosis not present

## 2015-08-30 DIAGNOSIS — R319 Hematuria, unspecified: Secondary | ICD-10-CM | POA: Diagnosis not present

## 2015-08-30 DIAGNOSIS — I10 Essential (primary) hypertension: Secondary | ICD-10-CM

## 2015-08-30 DIAGNOSIS — R1013 Epigastric pain: Secondary | ICD-10-CM | POA: Diagnosis not present

## 2015-08-30 DIAGNOSIS — Z1159 Encounter for screening for other viral diseases: Secondary | ICD-10-CM | POA: Diagnosis not present

## 2015-08-30 DIAGNOSIS — Z114 Encounter for screening for human immunodeficiency virus [HIV]: Secondary | ICD-10-CM | POA: Diagnosis not present

## 2015-08-30 DIAGNOSIS — Z23 Encounter for immunization: Secondary | ICD-10-CM | POA: Diagnosis not present

## 2015-08-30 LAB — POCT URINALYSIS DIP (MANUAL ENTRY)
Bilirubin, UA: NEGATIVE
Glucose, UA: NEGATIVE
Ketones, POC UA: NEGATIVE
Leukocytes, UA: NEGATIVE
Nitrite, UA: NEGATIVE
Spec Grav, UA: 1.025
Urobilinogen, UA: 0.2
pH, UA: 5.5

## 2015-08-30 LAB — COMPREHENSIVE METABOLIC PANEL
ALT: 36 U/L (ref 9–46)
AST: 23 U/L (ref 10–35)
Albumin: 4.5 g/dL (ref 3.6–5.1)
Alkaline Phosphatase: 59 U/L (ref 40–115)
BUN: 11 mg/dL (ref 7–25)
CO2: 25 mmol/L (ref 20–31)
Calcium: 9.4 mg/dL (ref 8.6–10.3)
Chloride: 104 mmol/L (ref 98–110)
Creat: 0.91 mg/dL (ref 0.70–1.33)
Glucose, Bld: 91 mg/dL (ref 65–99)
Potassium: 4.2 mmol/L (ref 3.5–5.3)
Sodium: 139 mmol/L (ref 135–146)
Total Bilirubin: 0.6 mg/dL (ref 0.2–1.2)
Total Protein: 7.3 g/dL (ref 6.1–8.1)

## 2015-08-30 LAB — CBC WITH DIFFERENTIAL/PLATELET
Basophils Absolute: 0 cells/uL (ref 0–200)
Basophils Relative: 0 %
Eosinophils Absolute: 124 cells/uL (ref 15–500)
Eosinophils Relative: 2 %
HCT: 50.1 % — ABNORMAL HIGH (ref 38.5–50.0)
Hemoglobin: 17.6 g/dL — ABNORMAL HIGH (ref 13.2–17.1)
Lymphocytes Relative: 31 %
Lymphs Abs: 1922 cells/uL (ref 850–3900)
MCH: 30.2 pg (ref 27.0–33.0)
MCHC: 35.1 g/dL (ref 32.0–36.0)
MCV: 86.1 fL (ref 80.0–100.0)
MPV: 9.4 fL (ref 7.5–12.5)
Monocytes Absolute: 372 cells/uL (ref 200–950)
Monocytes Relative: 6 %
Neutro Abs: 3782 cells/uL (ref 1500–7800)
Neutrophils Relative %: 61 %
Platelets: 238 10*3/uL (ref 140–400)
RBC: 5.82 MIL/uL — ABNORMAL HIGH (ref 4.20–5.80)
RDW: 14 % (ref 11.0–15.0)
WBC: 6.2 10*3/uL (ref 3.8–10.8)

## 2015-08-30 LAB — HIV ANTIBODY (ROUTINE TESTING W REFLEX): HIV 1&2 Ab, 4th Generation: NONREACTIVE

## 2015-08-30 LAB — HEPATITIS C ANTIBODY: HCV Ab: REACTIVE — AB

## 2015-08-30 LAB — PSA: PSA: 0.8 ng/mL (ref <=4.0)

## 2015-08-30 LAB — POC MICROSCOPIC URINALYSIS (UMFC): Mucus: ABSENT

## 2015-08-30 LAB — LIPID PANEL
Cholesterol: 269 mg/dL — ABNORMAL HIGH (ref 125–200)
HDL: 40 mg/dL (ref >=40)
LDL Cholesterol: 173 mg/dL — ABNORMAL HIGH (ref <130)
Total CHOL/HDL Ratio: 6.7 Ratio — ABNORMAL HIGH (ref <=5.0)
Triglycerides: 281 mg/dL — ABNORMAL HIGH (ref <150)
VLDL: 56 mg/dL — ABNORMAL HIGH (ref <30)

## 2015-08-30 NOTE — Progress Notes (Signed)
Patient ID: Ethan Silva, male    DOB: [birth year], 50 y.o.   MRN: [id number]  PCP: Dorrene GermanEdwin A Avbuere, MD  Chief Complaint  Patient presents with  . 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  He was here for an acute GI illness earlier this summer. He was noted to have hematuria and elevated hgb at that time. Those will be rechecked today.   Colorectal Cancer Screening: 2015, repeat in 10 years Prostate Cancer Screening: unknown Bone Density Testing: not yet a candidate HIV Screening: not yet STI Screening: very low risk Seasonal Influenza Vaccination: today Td/Tdap Vaccination: today Pneumococcal Vaccination: not yet a candidate Zoster Vaccination: not yet a candidate    Patient Active Problem List   Diagnosis Date Noted  . BMI 30.0-30.9,adult 08/30/2015  . Hyperlipidemia 08/30/2015  . HTN (hypertension) 10/11/2012    Past Medical History:  Diagnosis Date  . Bile duct leak   . Headache(784.0)    Hx: of when BP is elevated  . Hyperlipidemia   . Hypertension   . Numbness and tingling in hands    Hx: of     Prior to Admission medications   Medication Sig Start Date End Date Taking? Authorizing Provider  lisinopril (PRINIVIL,ZESTRIL) 20 MG tablet Take 1 tablet (20 mg total) by mouth daily. 07/06/15  Yes Savien Mamula, PA-C  lovastatin (MEVACOR) 20 MG tablet Take 1 tablet (20 mg total) by mouth at bedtime. 07/08/15  Yes Emony Dormer, PA-C  ranitidine (ZANTAC) 150 MG tablet Take 1 tablet (150 mg total) by mouth 2 (two) times daily. 07/06/15  Yes Porfirio Oarhelle Ceciley Buist, PA-C    No Known Allergies  Past Surgical History:  Procedure Laterality Date  . CHOLECYSTECTOMY N/A 07/11/2012   Procedure: LAPAROSCOPIC CHOLECYSTECTOMY WITH INTRAOPERATIVE CHOLANGIOGRAM;  Surgeon: Axel FillerArmando Ramirez, MD;  Location: [facility] OR;  Service: General;  Laterality: N/A;  . COLONOSCOPY    . ERCP  07/16/2012  . ERCP N/A 07/16/2012   Procedure: ENDOSCOPIC RETROGRADE  CHOLANGIOPANCREATOGRAPHY (ERCP);  Surgeon: Louis Meckelobert D Kaplan, MD;  [facility];  Service: Gastroenterology;  Laterality: N/A;  . ERCP N/A 10/06/2012   Procedure: ENDOSCOPIC RETROGRADE CHOLANGIOPANCREATOGRAPHY (ERCP);  Surgeon: Louis Meckelobert D Kaplan, MD;  [facility];  Service: Endoscopy;  Laterality: N/A;    Family History  Problem Relation Age of Onset  . Gallstones Mother   . Diabetes Paternal Uncle   . Gallstones Brother     Social History   Social History  . Marital status: Married    Spouse name: Davonna BellingMargarita  . Number of children: 3  . Years of education: 6th grade   Occupational History  . 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 Smoking status: Never Smoker  . Smokeless tobacco: Never Used  . Alcohol use No  . Drug use: No  . Sexual activity: Yes    Partners: Female   Other Topics Concern  . None   Social History Narrative   [place] Came to the [place] in [place].   Lives with his wife and their sons.   His daughter lives in [place], [place] with her husband.       Review of Systems  Constitutional: Negative.   HENT: Negative.   Eyes: Negative.   Respiratory: Negative.   Cardiovascular: Negative.   Gastrointestinal: Positive for abdominal pain (low pelvis, central, not daily. Lasts <5 minutes. Improves with rest and with BM). Negative for abdominal distention,  anal bleeding, blood in stool, constipation, diarrhea, nausea, rectal pain and vomiting.       Increased belching, heartburn/indigestion, upper abdominal pain. Notes a bulge above the umbilicus. Not painful.  Endocrine: Negative.   Genitourinary: Negative.   Musculoskeletal: Negative.   Skin: Negative.   Allergic/Immunologic: Negative.   Neurological: Negative.   Hematological: Negative.   Psychiatric/Behavioral: Negative.         Objective:  Physical Exam  Constitutional: He is oriented to person, place, and time. He  appears well-developed and well-nourished. He is active and cooperative.  Non-toxic appearance. He does not have a sickly appearance. He does not appear ill. No distress.  BP 140/82 (BP Location: Right Arm, Patient Position: Sitting, Cuff Size: Small)   Pulse 64   Temp 98.2 F (36.8 C) (Oral)   Resp 18   Ht 5\' 2"  (1.575 m)   Wt 169 lb (76.7 kg)   SpO2 97%   BMI 30.91 kg/m    HENT:  Head: Normocephalic and atraumatic.  Right Ear: Hearing, tympanic membrane, external ear and ear canal normal.  Left Ear: Hearing, tympanic membrane, external ear and ear canal normal.  Nose: Nose normal.  Mouth/Throat: Uvula is midline, oropharynx is clear and moist and mucous membranes are normal. He does not have dentures. No oral lesions. No trismus in the jaw. Normal dentition. No dental abscesses, uvula swelling, lacerations or dental caries.  Eyes: Conjunctivae, EOM and lids are normal. Pupils are equal, round, and reactive to light. Right eye exhibits no discharge. Left eye exhibits no discharge. No scleral icterus.  Fundoscopic exam:      The right eye shows no arteriolar narrowing, no AV nicking, no exudate, no hemorrhage and no papilledema.       The left eye shows no arteriolar narrowing, no AV nicking, no exudate, no hemorrhage and no papilledema.  Neck: Normal range of motion, full passive range of motion without pain and phonation normal. Neck supple. No spinous process tenderness and no muscular tenderness present. No neck rigidity. No tracheal deviation, no edema, no erythema and normal range of motion present. No thyromegaly present.  Cardiovascular: Normal rate, regular rhythm, S1 normal, S2 normal, normal heart sounds, intact distal pulses and normal pulses.  Exam reveals no gallop and no friction rub.   No murmur heard. Pulmonary/Chest: Effort normal and breath sounds normal. No respiratory distress. He has no wheezes. He has no rales.  Abdominal: Soft. Normal appearance and bowel sounds are  normal. He exhibits no distension and no mass. There is no hepatosplenomegaly. There is tenderness in the epigastric area. There is no rebound and no guarding. A hernia is present. Hernia confirmed positive in the ventral area.    Musculoskeletal: Normal range of motion. He exhibits no edema or tenderness.       Cervical back: Normal. He exhibits normal range of motion, no tenderness, no bony tenderness, no swelling, no edema, no deformity, no laceration, no pain, no spasm and normal pulse.       Thoracic back: Normal.       Lumbar back: Normal.  Lymphadenopathy:       Head (right side): No submental, no submandibular, no tonsillar, no preauricular, no posterior auricular and no occipital adenopathy present.       Head (left side): No submental, no submandibular, no tonsillar, no preauricular, no posterior auricular and no occipital adenopathy present.    He has no cervical adenopathy.       Right: No supraclavicular adenopathy present.  Left: No supraclavicular adenopathy present.  Neurological: He is alert and oriented to person, place, and time. He has normal strength and normal reflexes. He displays no tremor. No cranial nerve deficit. He exhibits normal muscle tone. Coordination and gait normal.  Skin: Skin is warm, dry and intact. No abrasion, no ecchymosis, no laceration, no lesion and no rash noted. He is not diaphoretic. No cyanosis or erythema. No pallor. Nails show no clubbing.  Psychiatric: He has a normal mood and affect. His speech is normal and behavior is normal. Judgment and thought content normal. Cognition and memory are normal.           Assessment & Plan:  1. Annual physical exam Age appropriate anticipatory guidance provided.  2. Need for influenza vaccination - Flu Vaccine QUAD 36+ mos IM  3. Need for Tdap vaccination - Tdap vaccine greater than or equal to 7yo IM  4. Need for hepatitis C screening test - Hepatitis C antibody  5. Screening for HIV  (human immunodeficiency virus) - HIV antibody  6. Screening for prostate cancer - PSA  7. Hyperlipidemia - Lipid panel  8. Essential hypertension Just controlled. Continue current regimen and monitoring. - CBC with Differential/Platelet - Comprehensive metabolic panel  9. Hematuria Likely due to dehydration from illness. Update today. - POCT urinalysis dipstick - POCT Microscopic Urinalysis ([facility])  10. BMI 30.0-30.9,adult Healthy eating and regular exercise.  11. Abdominal pain, epigastric Await lab results. H pylori eradication treatment if positive test. - H. pylori breath test  12. Ventral hernia without obstruction or gangrene Asymptomatic. Anticipatory guidance.   Fernande Bras, PA-C Physician Assistant-Certified [facility]

## 2015-08-30 NOTE — Patient Instructions (Addendum)
   IF you received an x-ray today, you will receive an invoice from [facility]. Please contact [facility] at [phone] with questions or concerns regarding your invoice.   IF you received labwork today, you will receive an invoice from [facility]. Please contact [facility] at [phone] with questions or concerns regarding your invoice.   Our billing staff will not be able to assist you with questions regarding bills from these companies.  You will be contacted with the lab results as soon as they are available. The fastest way to get your results is to activate your My Chart account. Instructions are located on the last page of this paperwork. If you have not heard from us regarding the results in 2 weeks, please contact this office.    Keeping you healthy  Get these tests  Blood pressure- Have your blood pressure checked once a year by your healthcare provider.  Normal blood pressure is 120/80  Weight- Have your body mass index (BMI) calculated to screen for obesity.  BMI is a measure of body fat based on height and weight. You can also calculate your own BMI at www.nhlbis[place]ort.com/bmi/.  Cholesterol- Have your cholesterol checked every year.  Diabetes- Have your blood sugar checked regularly if you have high blood pressure, high cholesterol, have a family history of diabetes or if you are overweight.  Screening for Colon Cancer- Colonoscopy starting at age 50.  Screening may begin sooner depending on your family history and other health conditions. Follow up colonoscopy as directed by your Gastroenterologist.  Screening for Prostate Cancer- Both blood work (PSA) and a rectal exam help screen for Prostate Cancer.  Screening begins at age 40 with African-American men and at age 50 with Caucasian men.  Screening may begin sooner depending on your family history.  Take these medicines  Aspirin- One aspirin daily can help prevent Heart  disease and Stroke.  Flu shot- Every fall.  Tetanus- Every 10 years.  Zostavax- Once after the age of 60 to prevent Shingles.  Pneumonia shot- Once after the age of 65; if you are younger than 65, ask your healthcare provider if you need a Pneumonia shot.  Take these steps  Don't smoke- If you do smoke, talk to your doctor about quitting.  For tips on how to quit, go to www.smokefree.gov or call 1-800-QUIT-NOW.  Be physically active- Exercise 5 days a week for at least 30 minutes.  If you are not already physically active start slow and gradually work up to 30 minutes of moderate physical activity.  Examples of moderate activity include walking briskly, mowing the yard, dancing, swimming, bicycling, etc.  Eat a healthy diet- Eat a variety of healthy food such as fruits, vegetables, low fat milk, low fat cheese, yogurt, lean meant, poultry, fish, beans, tofu, etc. For more information go to www.thenutritionsource.org  Drink alcohol in moderation- Limit alcohol intake to less than two drinks a day. Never drink and drive.  Dentist- Brush and floss twice daily; visit your dentist twice a year.  Depression- Your emotional health is as important as your physical health. If you're feeling down, or losing interest in things you would normally enjoy please talk to your healthcare provider.  Eye exam- Visit your eye doctor every year.  Safe sex- If you may be exposed to a sexually transmitted infection, use a condom.  Seat belts- Seat belts can save your life; always wear one.  Smoke/Carbon Monoxide detectors- These detectors need to be installed on   the appropriate level of your home.  Replace batteries at least once a year.  Skin cancer- When out in the sun, cover up and use sunscreen 15 SPF or higher.  Violence- If anyone is threatening you, please tell your healthcare provider.  Living Will/ Health care power of attorney- Speak with your healthcare provider and family. 

## 2015-08-31 LAB — H. PYLORI BREATH TEST: H. pylori Breath Test: DETECTED — AB

## 2015-09-01 LAB — HEPATITIS C RNA QUANTITATIVE: HCV Quantitative: NOT DETECTED IU/mL (ref <15)

## 2015-09-08 MED ORDER — LOVASTATIN 40 MG PO TABS: 40.0000 mg | ORAL_TABLET | Freq: Every day | ORAL | 3 refills | Status: DC

## 2015-09-08 MED ORDER — AMOXICILLIN 500 MG PO CAPS
1000.0000 mg | ORAL_CAPSULE | Freq: Two times a day (BID) | ORAL | 0 refills | Status: AC
Start: 2015-09-08 — End: 2015-09-18

## 2015-09-08 MED ORDER — CLARITHROMYCIN 500 MG PO TABS: 500.0000 mg | ORAL_TABLET | Freq: Two times a day (BID) | ORAL | 0 refills | Status: AC

## 2015-09-08 MED ORDER — OMEPRAZOLE 20 MG PO CPDR: 20.0000 mg | DELAYED_RELEASE_CAPSULE | Freq: Two times a day (BID) | ORAL | 0 refills | Status: DC

## 2015-09-08 NOTE — Addendum Note (Signed)
Addended by: Fernande BrasJEFFERY, Dreshon Proffit S on: 09/08/2015 08:35 AM   Modules accepted: Orders

## 2015-09-24 ENCOUNTER — Telehealth: Payer: Self-pay | Admitting: *Deleted

## 2015-09-26 NOTE — Telephone Encounter (Signed)
The results were sent to the patient with the following message in My Chart, which the EMR indicates has been read:  [name],   The test results show:  1. You do NOT have Hepatitis C (the screening test was positive, but the confirmatory test was NEGATIVE).  2. You do NOT have HIV.  3. Prostate, kidney and liver tests are NORMAL.  4. Your red blood cells are mildly elevated. We will watch this.  5. The cholesterol is too high. I will send in A higher dose of the Mevacor. You can use up the 20 mg tablets that you have by taking 2 of them together, one time each day. Then fill the new prescription for the 40 mg tablet. We need to recheck this in 12 weeks.  6. The test for Helicobacter pylori was positive. That is likely why you've been having the stomach problem. I will send in the treatment for that, as well.   Warmly,  Ethan Silva  The H pylori treatment was sent to the pharmacy on 9/17 (amxolicillin clarithromycin and Prilosec). The cholesterol medication (new, higher dose) was sent to his pharmacy on 9/07, a 90-day supply and 3 refills.  He should be set.

## 2015-10-09 ENCOUNTER — Encounter (HOSPITAL_COMMUNITY): Payer: Self-pay | Admitting: Emergency Medicine

## 2015-10-09 ENCOUNTER — Emergency Department (HOSPITAL_COMMUNITY): Payer: BLUE CROSS/BLUE SHIELD

## 2015-10-09 ENCOUNTER — Emergency Department (HOSPITAL_COMMUNITY)
Admission: EM | Admit: 2015-10-09 | Discharge: 2015-10-10 | Disposition: A | Discharge status: Home / Self Care | Payer: BLUE CROSS/BLUE SHIELD | Attending: Emergency Medicine | Admitting: Emergency Medicine

## 2015-10-09 DIAGNOSIS — N201 Calculus of ureter: Secondary | ICD-10-CM | POA: Insufficient documentation

## 2015-10-09 DIAGNOSIS — R319 Hematuria, unspecified: Secondary | ICD-10-CM | POA: Insufficient documentation

## 2015-10-09 DIAGNOSIS — R109 Unspecified abdominal pain: Secondary | ICD-10-CM

## 2015-10-09 DIAGNOSIS — I10 Essential (primary) hypertension: Secondary | ICD-10-CM | POA: Insufficient documentation

## 2015-10-09 LAB — URINALYSIS, ROUTINE W REFLEX MICROSCOPIC
Glucose, UA: NEGATIVE mg/dL
Ketones, ur: 15 mg/dL — AB
Nitrite: POSITIVE — AB
Protein, ur: >300 mg/dL — AB
Specific Gravity, Urine: 1.025 (ref 1.005–1.030)
pH: 5.5 (ref 5.0–8.0)

## 2015-10-09 LAB — BASIC METABOLIC PANEL
Anion gap: 11 (ref 5–15)
BUN: 14 mg/dL (ref 6–20)
CO2: 26 mmol/L (ref 22–32)
Calcium: 9.5 mg/dL (ref 8.9–10.3)
Chloride: 105 mmol/L (ref 101–111)
Creatinine, Ser: 1.47 mg/dL — ABNORMAL HIGH (ref 0.61–1.24)
GFR calc Af Amer: >60 mL/min (ref >60)
GFR calc non Af Amer: 53 mL/min — ABNORMAL LOW (ref >60)
Glucose, Bld: 120 mg/dL — ABNORMAL HIGH (ref 65–99)
Potassium: 3.5 mmol/L (ref 3.5–5.1)
Sodium: 142 mmol/L (ref 135–145)

## 2015-10-09 LAB — CBC
HCT: 50.6 % (ref 39.0–52.0)
Hemoglobin: 17.2 g/dL — ABNORMAL HIGH (ref 13.0–17.0)
MCH: 30.3 pg (ref 26.0–34.0)
MCHC: 34 g/dL (ref 30.0–36.0)
MCV: 89.1 fL (ref 78.0–100.0)
Platelets: 231 10*3/uL (ref 150–400)
RBC: 5.68 MIL/uL (ref 4.22–5.81)
RDW: 13.4 % (ref 11.5–15.5)
WBC: 12 10*3/uL — ABNORMAL HIGH (ref 4.0–10.5)

## 2015-10-09 LAB — URINE MICROSCOPIC-ADD ON

## 2015-10-09 MED ORDER — HYDROMORPHONE HCL 1 MG/ML IJ SOLN
1.0000 mg | Freq: Once | INTRAMUSCULAR | Status: AC
  Administered 2015-10-09: 1 mg via INTRAVENOUS
  Filled 2015-10-09: qty 1

## 2015-10-09 MED ORDER — ONDANSETRON 4 MG PO TBDP
ORAL_TABLET | ORAL | Status: AC
  Filled 2015-10-09: qty 1

## 2015-10-09 MED ORDER — AMLODIPINE BESYLATE 5 MG PO TABS: 5.0000 mg | ORAL_TABLET | Freq: Every day | ORAL | 0 refills | Status: DC

## 2015-10-09 MED ORDER — KETOROLAC TROMETHAMINE 30 MG/ML IJ SOLN
15.0000 mg | Freq: Once | INTRAMUSCULAR | Status: AC
  Administered 2015-10-09: 15 mg via INTRAVENOUS
  Filled 2015-10-09: qty 1

## 2015-10-09 MED ORDER — ONDANSETRON HCL 4 MG/2ML IJ SOLN
4.0000 mg | Freq: Once | INTRAMUSCULAR | Status: AC
  Administered 2015-10-09: 4 mg via INTRAVENOUS
  Filled 2015-10-09: qty 2

## 2015-10-09 MED ORDER — ONDANSETRON 4 MG PO TBDP
4.0000 mg | ORAL_TABLET | Freq: Once | ORAL | Status: AC
  Administered 2015-10-09: 4 mg via ORAL

## 2015-10-09 MED ORDER — OXYCODONE-ACETAMINOPHEN 5-325 MG PO TABS
ORAL_TABLET | ORAL | Status: AC
  Filled 2015-10-09: qty 1

## 2015-10-09 MED ORDER — SODIUM CHLORIDE 0.9 % IV SOLN: INTRAVENOUS | Status: DC

## 2015-10-09 MED ORDER — OXYCODONE-ACETAMINOPHEN 5-325 MG PO TABS: 1.0000 | ORAL_TABLET | Freq: Four times a day (QID) | ORAL | 0 refills | Status: DC | PRN

## 2015-10-09 MED ORDER — OXYCODONE-ACETAMINOPHEN 5-325 MG PO TABS
1.0000 | ORAL_TABLET | ORAL | Status: DC | PRN
  Administered 2015-10-09: 1 via ORAL

## 2015-10-09 NOTE — ED Triage Notes (Signed)
Patient actively vomiting in waiting room, brought back into triage for for nausea and pain medicine

## 2015-10-09 NOTE — ED Provider Notes (Addendum)
[facility]-EMERGENCY DEPT Provider Note   CSN: [id number] Arrival date & time: 10/09/15  1625     History   Chief Complaint Chief Complaint  Patient presents with  . Flank Pain    HPI Ethan Silva is a 50 y.o. male.  Patient with hx htn, c/o left flank pain, acute onset yesterday. Pain constant, mod-severe, persistent, without specific exacerbating or alleviating factors. No dysuria or hematuria. No hx kidney stone. No trauma to area. No fever or chills. Remote hx cholecystectomy, no other abd surgery. Denies scrotal or testicular pain.    The history is provided by a relative. A language interpreter was used.  Flank Pain  Pertinent negatives include no chest pain, no headaches and no shortness of breath.    Past Medical History:  Diagnosis Date  . Bile duct leak   . Headache(784.0)    Hx: of when BP is elevated  . Hyperlipidemia   . Hypertension   . Numbness and tingling in hands    Hx: of    Patient Active Problem List   Diagnosis Date Noted  . BMI 30.0-30.9,adult 08/30/2015  . Hyperlipidemia 08/30/2015  . HTN (hypertension) 10/11/2012    Past Surgical History:  Procedure Laterality Date  . CHOLECYSTECTOMY N/A 07/11/2012   Procedure: LAPAROSCOPIC CHOLECYSTECTOMY WITH INTRAOPERATIVE CHOLANGIOGRAM;  Surgeon: Axel FillerArmando Ramirez, MD;  Location: [facility] OR;  Service: General;  Laterality: N/A;  . COLONOSCOPY    . ERCP  07/16/2012  . ERCP N/A 07/16/2012   Procedure: ENDOSCOPIC RETROGRADE CHOLANGIOPANCREATOGRAPHY (ERCP);  Surgeon: Louis Meckelobert D Kaplan, MD;  [facility];  Service: Gastroenterology;  Laterality: N/A;  . ERCP N/A 10/06/2012   Procedure: ENDOSCOPIC RETROGRADE CHOLANGIOPANCREATOGRAPHY (ERCP);  Surgeon: Louis Meckelobert D Kaplan, MD;  [facility];  Service: Endoscopy;  Laterality: N/A;       Home Medications    Prior to Admission medications   Medication Sig Start Date End Date Taking? Authorizing Provider  alum & mag hydroxide-simeth (MAALOX PLUS) 400-400-40  MG/5ML suspension Take 5 mLs by mouth every 6 (six) hours as needed for indigestion.   Yes Historical Provider, MD  amLODipine (NORVASC) 5 MG tablet Take 1 tablet (5 mg total) by mouth daily. 10/09/15   Cathren LaineKevin Dalary Hollar, MD  omeprazole (PRILOSEC) 20 MG capsule Take 1 capsule (20 mg total) by mouth 2 (two) times daily before a meal. 09/08/15 09/18/15  Chelle Jeffery, PA-C  oxyCODONE-acetaminophen (PERCOCET/ROXICET) 5-325 MG tablet Take 1-2 tablets by mouth every 6 (six) hours as needed for severe pain. 10/09/15   Cathren LaineKevin Alaija Ruble, MD    Family History Family History  Problem Relation Age of Onset  . Gallstones Mother   . Diabetes Paternal Uncle   . Gallstones Brother     Social History Social History  Substance Use Topics  . Smoking status: Never Smoker  . Smokeless tobacco: Never Used  . Alcohol use No     Allergies   Review of patient's allergies indicates no known allergies.   Review of Systems Review of Systems  Constitutional: Negative for fever.  HENT: Negative for sore throat.   Eyes: Negative for redness.  Respiratory: Negative for shortness of breath.   Cardiovascular: Negative for chest pain.  Gastrointestinal: Positive for nausea and vomiting.  Genitourinary: Positive for flank pain. Negative for dysuria.  Musculoskeletal: Positive for back pain.  Skin: Negative for rash.  Neurological: Negative for headaches.  Hematological: Does not bruise/bleed easily.  Psychiatric/Behavioral: Negative for confusion.     Physical Exam Updated Vital Signs BP 147/99  Pulse 74   Temp 98.7 F (37.1 C) (Oral)   Resp 18   SpO2 93%   Physical Exam  Constitutional: He appears well-developed and well-nourished. No distress.  HENT:  Head: Atraumatic.  Eyes: Conjunctivae are normal.  Neck: Neck supple. No tracheal deviation present.  Cardiovascular: Normal rate, regular rhythm, normal heart sounds and intact distal pulses.   Pulmonary/Chest: Effort normal and breath sounds normal. No  accessory muscle usage. No respiratory distress.  Abdominal: Soft. Bowel sounds are normal. He exhibits no distension and no mass. There is no tenderness. There is no rebound and no guarding. No hernia.  Genitourinary:  Genitourinary Comments: No cva tenderness. No testicular pain or tenderness.   Musculoskeletal: He exhibits no edema.  Neurological: He is alert.  Skin: Skin is warm and dry. No rash noted.  Psychiatric: He has a normal mood and affect.  Nursing note and vitals reviewed.    ED Treatments / Results  Labs (all labs ordered are listed, but only abnormal results are displayed) Results for orders placed or performed during the hospital encounter of 10/09/15  Urinalysis, Routine w reflex microscopic- may I&O cath if menses  Result Value Ref Range   Color, Urine RED (A) YELLOW   APPearance TURBID (A) CLEAR   Specific Gravity, Urine 1.025 1.005 - 1.030   pH 5.5 5.0 - 8.0   Glucose, UA NEGATIVE NEGATIVE mg/dL   Hgb urine dipstick LARGE (A) NEGATIVE   Bilirubin Urine SMALL (A) NEGATIVE   Ketones, ur 15 (A) NEGATIVE mg/dL   Protein, ur >161 (A) NEGATIVE mg/dL   Nitrite POSITIVE (A) NEGATIVE   Leukocytes, UA TRACE (A) NEGATIVE  Basic metabolic panel  Result Value Ref Range   Sodium 142 135 - 145 mmol/L   Potassium 3.5 3.5 - 5.1 mmol/L   Chloride 105 101 - 111 mmol/L   CO2 26 22 - 32 mmol/L   Glucose, Bld 120 (H) 65 - 99 mg/dL   BUN 14 6 - 20 mg/dL   Creatinine, Ser 0.96 (H) 0.61 - 1.24 mg/dL   Calcium 9.5 8.9 - 04.5 mg/dL   GFR calc non Af Amer 53 (L) >60 mL/min   GFR calc Af Amer >60 >60 mL/min   Anion gap 11 5 - 15  CBC  Result Value Ref Range   WBC 12.0 (H) 4.0 - 10.5 K/uL   RBC 5.68 4.22 - 5.81 MIL/uL   Hemoglobin 17.2 (H) 13.0 - 17.0 g/dL   HCT 40.9 81.1 - 91.4 %   MCV 89.1 78.0 - 100.0 fL   MCH 30.3 26.0 - 34.0 pg   MCHC 34.0 30.0 - 36.0 g/dL   RDW 78.2 95.6 - 21.3 %   Platelets 231 150 - 400 K/uL  Urine microscopic-add on  Result Value Ref Range    Squamous Epithelial / LPF 0-5 (A) NONE SEEN   WBC, UA 0-5 0 - 5 WBC/hpf   RBC / HPF TOO NUMEROUS TO COUNT 0 - 5 RBC/hpf   Bacteria, UA FEW (A) NONE SEEN   Crystals CA OXALATE CRYSTALS (A) NEGATIVE    EKG  EKG Interpretation None       Radiology No results found.  Procedures Procedures (including critical care time)  Medications Ordered in ED Medications  oxyCODONE-acetaminophen (PERCOCET/ROXICET) 5-325 MG per tablet 1 tablet (0 tablets Oral Hold 10/09/15 2054)  0.9 %  sodium chloride infusion ( Intravenous New Bag/Given 10/09/15 2051)  ondansetron (ZOFRAN-ODT) disintegrating tablet 4 mg (4 mg Oral Given 10/09/15 1709)  HYDROmorphone (DILAUDID) injection 1 mg (1 mg Intravenous Given 10/09/15 2053)  ondansetron (ZOFRAN) injection 4 mg (4 mg Intravenous Given 10/09/15 2052)  ketorolac (TORADOL) 30 MG/ML injection 15 mg (15 mg Intravenous Given 10/09/15 2053)     Initial Impression / Assessment and Plan / ED Course  I have reviewed the triage vital signs and the nursing notes.  Pertinent labs & imaging results that were available during my care of the patient were reviewed by me and considered in my medical decision making (see chart for details).  Clinical Course   Iv ns. Pt has ride, does not have to drive.  Dilaudid 1 mg iv, zofran iv, toradol 15 mg iv.   Recheck pain improved/resolve.  Recheck pain resolved. Discussed ct wt pt - given relatively large size and more proximal location, discussed likely need for close urology f/u.      Final Clinical Impressions(s) / ED Diagnoses   Final diagnoses:  Left flank pain  Hematuria, unspecified type    New Prescriptions New Prescriptions   AMLODIPINE (NORVASC) 5 MG TABLET    Take 1 tablet (5 mg total) by mouth daily.   OXYCODONE-ACETAMINOPHEN (PERCOCET/ROXICET) 5-325 MG TABLET    Take 1-2 tablets by mouth every 6 (six) hours as needed for severe pain.     Cathren Laine, MD 10/10/15 2536    Cathren Laine, MD 10/10/15  [phone]

## 2015-10-09 NOTE — ED Triage Notes (Signed)
Pt c/o L sided flank pain since yesterday, states he had a similar episode but it went away, this time its not going away. Has seen PCP for it but was not diagnosed with anything. Pt vomiting. 10/10 pain.

## 2015-10-09 NOTE — Discharge Instructions (Addendum)
It was our pleasure to provide your ER care today - we hope that you feel better.  Drink plenty of fluids.  Take percocet as need for pain - no driving when taking. Also, do not take tylenol or acetaminophen when taking the percocet.  Take zofran as need for nausea.    Take flomax as prescribed.   From today's lab tests, your kidney function test is mildly elevated (creatinine 1.47) - avoid taking any anti-inflammatory pain medication such as ibuprofen, motrin, aleve or naprosyn. For blood pressure, stop taking the lisinopril, and instead take amlodipine.  Follow up with your doctor for recheck in the coming week for recheck.   Your ct scan shows a 6 by 8 mm proximal left ureter kidney stone.  Follow up with urologist in the next couple of days - see referral - call office this AM to arrange appointment.   You were given pain medication in the ER - no driving for the next 6 hours.

## 2015-10-09 NOTE — ED Notes (Signed)
Pt ambulated to bathroom 

## 2015-10-10 MED ORDER — ONDANSETRON 8 MG PO TBDP: 8.0000 mg | ORAL_TABLET | Freq: Three times a day (TID) | ORAL | 0 refills | Status: DC | PRN

## 2015-10-10 MED ORDER — OXYCODONE-ACETAMINOPHEN 5-325 MG PO TABS: 1.0000 | ORAL_TABLET | Freq: Four times a day (QID) | ORAL | 0 refills | Status: DC | PRN

## 2015-10-10 MED ORDER — AMLODIPINE BESYLATE 5 MG PO TABS: 5.0000 mg | ORAL_TABLET | Freq: Every day | ORAL | 0 refills | Status: DC

## 2015-10-10 MED ORDER — TAMSULOSIN HCL 0.4 MG PO CAPS: 0.4000 mg | ORAL_CAPSULE | Freq: Every day | ORAL | 0 refills | Status: DC

## 2015-10-11 ENCOUNTER — Other Ambulatory Visit: Payer: Self-pay | Admitting: Urology

## 2015-10-12 ENCOUNTER — Encounter (HOSPITAL_COMMUNITY): Payer: Self-pay | Admitting: *Deleted

## 2015-10-12 NOTE — H&P (Signed)
CC: Acute Kidney Stone  HPI: Ethan Silva is a 50 year-old male patient who was referred by Dr. Cathren LaineKevin Steinl, MD who is here for further eval and management of kidney stones.  He was diagnosed with a kidney stone on approximately 10/09/2015. The patient [facility] with symptoms of a kidney stone.   His pain started about approximately 09/27/2015. The pain is on the left side.   Abdomen/Pelvic CT: CT- 5x258mm stone in the mid-left ureter. The patient underwent No imaging prior to today's appointment.   The patient relates initially having nausea, vomiting, and flank pain. He is currently having flank pain and nausea. He denies having back pain, groin pain, vomiting, fever, chills, and voiding symptoms. He has been taking Flomax, Percocet. He has not caught a stone in his urine strainer since his symptoms began.   He has never had surgical treatment for calculi in the past. This is his first kidney stone.     ALLERGIES: None   MEDICATIONS: Percocet 5 mg-325 mg tablet 1 tablet PO Daily  Tamsulosin Hcl 0.4 mg capsule, ext release 24 hr 1 capsule PO Daily  Amlodipine Besylate 5 mg tablet  Ondansetron Hcl 8 mg tablet     GU PSH: None   NON-GU PSH: Cholecystectomy Hand/finger Surgery    GU PMH: None     PMH Notes: He presented to the emergency room on 10/09/15 with acute onset left flank pain was persistent and not relieved by positional change. A CT scan was obtained which revealed a 5.5 mm wide by 8 mm long stone within the left ureter. It has Hounsfield units of 600. There were sand grain size stones noted in the lower pole of the left kidney as well.   NON-GU PMH: Hypercholesterolemia Hypertension    FAMILY HISTORY: None   SOCIAL HISTORY: Marital Status: Married Current Smoking Status: Patient has never smoked.  Has never drank.  Drinks 1 caffeinated drink per day.    REVIEW OF SYSTEMS:    GU Review Male:   Patient reports burning/ pain with urination, get  up at night to urinate, leakage of urine, stream starts and stops, and have to strain to urinate . Patient denies frequent urination, hard to postpone urination, trouble starting your stream, erection problems, and penile pain.  Gastrointestinal (Upper):   Patient reports nausea and vomiting. Patient denies indigestion/ heartburn.  Gastrointestinal (Lower):   Patient denies diarrhea and constipation.  Constitutional:   Patient denies fever, night sweats, weight loss, and fatigue.  Skin:   Patient denies skin rash/ lesion and itching.  Eyes:   Patient denies blurred vision and double vision.  Ears/ Nose/ Throat:   Patient denies sinus problems and sore throat.  Hematologic/Lymphatic:   Patient denies swollen glands and easy bruising.  Cardiovascular:   Patient denies leg swelling and chest pains.  Respiratory:   Patient denies cough and shortness of breath.  Endocrine:   Patient denies excessive thirst.  Musculoskeletal:   Patient reports back pain. Patient denies joint pain.  Neurological:   Patient reports dizziness. Patient denies headaches.  Psychologic:   Patient denies depression and anxiety.   VITAL SIGNS:      10/11/2015 12:20 PM  Weight 168 lb / 76.2 kg  Height 63 in / 160.02 cm  BP 158/91 mmHg  Pulse 83 /min  BMI 29.8 kg/m   MULTI-SYSTEM PHYSICAL EXAMINATION:    Constitutional: Well-nourished. No physical deformities. Normally developed. Good grooming.  Neck: Neck symmetrical, not swollen. Normal  tracheal position.  Respiratory: No labored breathing, no use of accessory muscles.   Cardiovascular: Normal temperature, normal extremity pulses, no swelling, no varicosities.  Lymphatic: No enlargement of neck, axillae, groin.  Skin: No paleness, no jaundice, no cyanosis. No lesion, no ulcer, no rash.  Neurologic / Psychiatric: Oriented to time, oriented to place, oriented to person. No depression, no anxiety, no agitation.  Gastrointestinal: No mass, no tenderness, no rigidity,  non obese abdomen.  Eyes: Normal conjunctivae. Normal eyelids.  Ears, Nose, Mouth, and Throat: Left ear no scars, no lesions, no masses. Right ear no scars, no lesions, no masses. Nose no scars, no lesions, no masses. Normal hearing. Normal lips.  Musculoskeletal: Normal gait and station of head and neck.     PAST DATA REVIEWED:  Source Of History:  Patient  Records Review:   Previous Hospital Records  X-Ray Review: C.T. Abdomen/Pelvis: Reviewed Films. Discussed With Patient. left proximal ureteral stone    PROCEDURES:         KUB - 74000  A single view of the abdomen is obtained. Renal shadows are easily visualized bilaterally. There are no stones appreciated within the expected location in either renal pelvis. Gas pattern is grossly normal. No significant bony abnormalities.      The patient's previously visualized stone around L3 on the left-hand side is clearly visible on today's KUB. There are no additional stones noted along the left transverse process or expected location of the left ureter.         Urinalysis w/Scope - 81001 Dipstick Dipstick Cont'd Micro  Specimen: Voided Bilirubin: Neg WBC/hpf: 0-5/hpf  Color: Amber Ketones: Neg RBC/hpf: >60/hpf  Appearance: Cloudy Blood: 3+ Bacteria: NS (Not Seen)  Specific Gravity: 1.025 Protein: 2+ Cystals: NS (Not Seen)  pH: 5.5 Urobilinogen: 0.2 Casts: NS (Not Seen)  Glucose: Neg Nitrites: Neg Trichomonas: Not Present    Leukocyte Esterase: Neg Mucous: Present      Epithelial Cells: 0-5/hpf      Yeast: NS (Not Seen)      Sperm: Not Present    ASSESSMENT:      ICD-10 Details  1 GU:   Calculus Ureter - N20.1    PLAN:           Orders Labs Urine Culture and Sensitivity  X-Rays: KUB          Schedule         Document Letter(s):  Created for Patient: Clinical Summary    We discussed management options including medical expulsion therapy, shockwave lithotripsy, and ureteroscopy. Ultimately, the patient has opted for  shock wave lithotripsy. I discussed with the patient the procedure in detail as well as the risk and benefits. The patient is aware that she may need additional procedures. She also is aware of the risks of hematoma and pain. We will try to get this patient's scheduled as soon as possible.

## 2015-10-13 ENCOUNTER — Ambulatory Visit (HOSPITAL_COMMUNITY)
Admission: RE | Admit: 2015-10-13 | Discharge: 2015-10-13 | Disposition: A | Discharge status: Home / Self Care | Payer: BLUE CROSS/BLUE SHIELD | Source: Ambulatory Visit | Attending: Urology | Admitting: Urology

## 2015-10-13 ENCOUNTER — Ambulatory Visit (HOSPITAL_COMMUNITY): Payer: BLUE CROSS/BLUE SHIELD

## 2015-10-13 ENCOUNTER — Encounter (HOSPITAL_COMMUNITY)
Admission: RE | Disposition: A | Discharge status: Home / Self Care | Payer: Self-pay | Source: Ambulatory Visit | Attending: Urology

## 2015-10-13 ENCOUNTER — Encounter (HOSPITAL_COMMUNITY): Payer: Self-pay | Admitting: General Practice

## 2015-10-13 DIAGNOSIS — Z79899 Other long term (current) drug therapy: Secondary | ICD-10-CM | POA: Insufficient documentation

## 2015-10-13 DIAGNOSIS — N201 Calculus of ureter: Secondary | ICD-10-CM

## 2015-10-13 SURGERY — LITHOTRIPSY, ESWL
Anesthesia: LOCAL | Laterality: Left

## 2015-10-13 MED ORDER — SODIUM CHLORIDE 0.9 % IV SOLN: INTRAVENOUS | Status: DC

## 2015-10-13 MED ORDER — CIPROFLOXACIN HCL 500 MG PO TABS
500.0000 mg | ORAL_TABLET | ORAL | Status: AC
  Administered 2015-10-13: 500 mg via ORAL
  Filled 2015-10-13: qty 1

## 2015-10-13 MED ORDER — SODIUM CHLORIDE 0.9% FLUSH: 3.0000 mL | INTRAVENOUS | Status: DC | PRN

## 2015-10-13 MED ORDER — OXYCODONE HCL 5 MG PO TABS: 5.0000 mg | ORAL_TABLET | ORAL | Status: DC | PRN

## 2015-10-13 MED ORDER — TAMSULOSIN HCL 0.4 MG PO CAPS: 0.4000 mg | ORAL_CAPSULE | Freq: Every day | ORAL | 0 refills | Status: DC

## 2015-10-13 MED ORDER — OXYCODONE-ACETAMINOPHEN 5-325 MG PO TABS: 1.0000 | ORAL_TABLET | Freq: Four times a day (QID) | ORAL | 0 refills | Status: DC | PRN

## 2015-10-13 MED ORDER — ACETAMINOPHEN 325 MG PO TABS: 650.0000 mg | ORAL_TABLET | ORAL | Status: DC | PRN

## 2015-10-13 MED ORDER — DIPHENHYDRAMINE HCL 25 MG PO CAPS
25.0000 mg | ORAL_CAPSULE | ORAL | Status: AC
  Administered 2015-10-13: 25 mg via ORAL
  Filled 2015-10-13: qty 1

## 2015-10-13 MED ORDER — SODIUM CHLORIDE 0.9% FLUSH: 3.0000 mL | Freq: Two times a day (BID) | INTRAVENOUS | Status: DC

## 2015-10-13 MED ORDER — SODIUM CHLORIDE 0.9 % IV SOLN: 250.0000 mL | INTRAVENOUS | Status: DC | PRN

## 2015-10-13 MED ORDER — ACETAMINOPHEN 650 MG RE SUPP: 650.0000 mg | RECTAL | Status: DC | PRN

## 2015-10-13 MED ORDER — FENTANYL CITRATE (PF) 100 MCG/2ML IJ SOLN: 25.0000 ug | INTRAMUSCULAR | Status: DC | PRN

## 2015-10-13 MED ORDER — DIAZEPAM 5 MG PO TABS
10.0000 mg | ORAL_TABLET | ORAL | Status: AC
  Administered 2015-10-13: 10 mg via ORAL
  Filled 2015-10-13: qty 2

## 2015-10-13 NOTE — Interval H&P Note (Signed)
History and Physical Interval Note:  The stone is difficult to see on the KUB today but appears to still be over the left L3 transverse process.   10/13/2015 7:49 AM  Ethan Silva  has presented today for surgery, with the diagnosis of LEFT URETERAL STONE  The various methods of treatment have been discussed with the patient and family. After consideration of risks, benefits and other options for treatment, the patient has consented to  Procedure(s): LEFT EXTRACORPOREAL SHOCK WAVE LITHOTRIPSY (ESWL) (Left) as a surgical intervention .  The patient's history has been reviewed, patient examined, no change in status, stable for surgery.  I have reviewed the patient's chart and labs.  Questions were answered to the patient's satisfaction.     Kirah Stice J

## 2015-10-13 NOTE — Discharge Instructions (Signed)
Litotricia, cuidados posteriores (Lithotripsy, Care After) Siga estas instrucciones durante las prximas semanas. Estas indicaciones le proporcionan informacin general acerca de cmo deber cuidarse despus del procedimiento. El mdico tambin podr darle instrucciones ms especficas. El tratamiento ha sido planificado segn las prcticas mdicas actuales, pero en algunos casos pueden ocurrir problemas. Comunquese con el mdico si tiene algn problema o tiene dudas despus del procedimiento. QU ESPERAR DESPUS DEL PROCEDIMIENTO   Probablemente la [place] tenga un tinte rojo durante algunos das despus del Midvaletratamiento. La prdida de sangre generalmente es mnima  Podr sentir dolor en la zona de la espalda o en un lado. Esto generalmente desaparece en Time Warneralgunos das. El procedimiento puede causar manchas o hematomas en la espalda, donde la onda de presin ingresa en la piel. Estas marcas generalmente causan slo una molestia mnima y deben desaparecer en un corto plazo.  Los fragmentos de piedra deben comenzar a eliminarse dentro de las 24 horas del Shermantratamiento. Sin embargo, la demora en la eliminacin no es infrecuente.  Es posible que Stage managersienta dolor, molestias y Programme researcher, broadcasting/film/videomalestar estomacal (nuseas) cuando los fragmentos rotos del clculo pasen por el tubo, desde el rin a la vejiga. Los fragmentos del clculo se pueden eliminar poco despus del procedimiento y pueden demorar hasta 4 a 8semanas.  Un pequeo nmero de pacientes podr sentir dolor intenso cuando los fragmentos de piedra no puedan pasar, lo que conduce a una obstruccin.  Si el clculo es ms grande que 1pulgada (2,5cm) de dimetro o tiene mltiples clculos cuyo dimetro combinado es mayor que 1pulgada (2,5cm), podr necesitar ms de Pharmacist, communityun tratamiento.  Si se le coloc un stent antes del procedimiento, es posible que sienta un poco de Dentistmalestar, especialmente Avonmientras orina. Puede sentir dolor o Dentistmalestar en la fosa lumbar o la espalda, o  posiblemente sienta un dolor intenso o malestar en la base del pene o en la parte inferior del abdomen. El Dentistmalestar generalmente dura unos cuantos minutos despus de haber orinado. INSTRUCCIONES PARA EL CUIDADO EN EL HOGAR   Haga reposo en su casa hasta que sienta que su energa mejora.  Utilice los medicamentos de venta libre o recetados para Primary school teachercalmar el dolor, el malestar o la fiebre, segn se lo indique el mdico. Segn el tipo de litotricia, es posible que deba tomar antibiticos y Counsellorantiinflamatorios durante unos das.  Beba gran cantidad de lquido para mantener la orina de tono claro o color amarillo plido. Esto ayuda a "limpiar" los riones. Ayuda a eliminar restos de piedras que puedan quedar y evita que vuelvan.  La Harley-Davidsonmayora de las personas pueden retomar sus actividades diarias en 1 o 2das luego de una litotricia estndar. Puede demorar ms tiempo recuperarse de una litotricia con lser y percutnea.  Filtre toda la orina a travs del filtro que se proporciona. Conserve todas las partculas y las piedras para que el mdico las examine. La piedra puede ser tan pequea como un grano de sal. Es importante que use el filtro cada vez que [place]. Todas las piedras halladas se enviarn al laboratorio para ser examinadas.  Visite al mdico luego de algunas semanas para que le realice un seguimiento. El mdico retirar el stent, en caso que se lo hayan colocado. El mdico tambin controlar que no hayan quedado partculas del clculo. SOLICITE ATENCIN MDICA SI:   El dolor no se alivia con los United Parcelmedicamentos.  Tiene una sensacin nauseosa duradera.  Siente que hay demasiada sangre en la orina.  Tiene una miccin frecuente o dolorosa que le provoca problemas  y no mejora, al [employer], 2das despus del procedimiento.  Tiene tos congestiva.  Se siente mareada.  Aparece una erupcin u otros signos que podran sugerir un problema alrgico.  Lorretta Harp reaccin o efecto  secundario por los medicamentos. SOLICITE ATENCIN MDICA DE INMEDIATO SI:   Siente un dolor intenso en la espalda o en la zona lumbar o en ambos.  Al orinar, observa solo [place].  No puede orinar.  Siente escalofros o fiebre.  Siente dolor en el pecho, le falta el aire o tiene dificultad para Industrial/product designer.  Comienza con vmitos que no ceden luego de 6 a 8horas.  Sufre un [employer].   Esta informacin no tiene Theme park manager el consejo del mdico. Asegrese de hacerle al mdico cualquier pregunta que tenga.   Document Released: 10/08/2012 Document Revised: 09/08/2014 Elsevier Interactive Patient Education [employer] Lithotripsy, Care After Refer to this sheet in the next few weeks. These instructions provide you with information on caring for yourself after your procedure. Your health care provider may also give you more specific instructions. Your treatment has been planned according to current medical practices, but problems sometimes occur. Call your health care provider if you have any problems or questions after your procedure. WHAT TO EXPECT AFTER THE PROCEDURE   Your urine may have a red tinge for a few days after treatment. Blood loss is usually minimal.  You may have soreness in the back or flank area. This usually goes away after a few days. The procedure can cause blotches or bruises on the back where the pressure wave enters the skin. These marks usually cause only minimal discomfort and should disappear in a short time.  Stone fragments should begin to pass within 24 hours of treatment. However, a delayed passage is not unusual.  You may have pain, discomfort, and feel sick to your stomach (nauseated) when the crushed fragments of stone are passed down the tube from the kidney to the bladder. Stone fragments can pass soon after the procedure and may last for up to 4-8 weeks.  A small number of patients may have severe pain when stone fragments are not able to  pass, which leads to an obstruction.  If your stone is greater than 1 inch (2.5 cm) in diameter or if you have multiple stones that have a combined diameter greater than 1 inch (2.5 cm), you may require more than one treatment.  If you had a stent placed prior to your procedure, you may experience some discomfort, especially during urination. You may experience the pain or discomfort in your flank or back, or you may experience a sharp pain or discomfort at the base of your penis or in your lower abdomen. The discomfort usually lasts only a few minutes after urinating. HOME CARE INSTRUCTIONS   Rest at home until you feel your energy improving.  Only take over-the-counter or prescription medicines for pain, discomfort, or fever as directed by your health care provider. Depending on the type of lithotripsy, you may need to take antibiotics and anti-inflammatory medicines for a few days.  Drink enough water and fluids to keep your urine clear or pale yellow. This helps "flush" your kidneys. It helps pass any remaining pieces of stone and prevents stones from coming back.  Most people can resume daily activities within 1-2 days after standard lithotripsy. It can take longer to recover from laser and percutaneous lithotripsy.  Strain all urine through the provided strainer. Keep all particulate matter and stones for  your health care provider to see. The stone may be as small as a grain of salt. It is very important to use the strainer each and every time you pass your urine. Any stones that are found can be sent to a medical lab for examination.  Visit your health care provider for a follow-up appointment in a few weeks. Your doctor may remove your stent if you have one. Your health care provider will also check to see whether stone particles still remain. SEEK MEDICAL CARE IF:   Your pain is not relieved by medicine.  You have a lasting nauseous feeling.  You feel there is too much blood in the  urine.  You develop persistent problems with frequent or painful urination that does not at least partially improve after 2 days following the procedure.  You have a congested cough.  You feel lightheaded.  You develop a rash or any other signs that might suggest an allergic problem.  You develop any reaction or side effects to your medicine(s). SEEK IMMEDIATE MEDICAL CARE IF:   You experience severe back or flank pain or both.  You see nothing but blood when you urinate.  You cannot pass any urine at all.  You have a fever or shaking chills.  You develop shortness of breath, difficulty breathing, or chest pain.  You develop vomiting that will not stop after 6-8 hours.  You have a fainting episode.   This information is not intended to replace advice given to you by your health care provider. Make sure you discuss any questions you have with your health care provider.   Document Released: 01/07/2007 Document Revised: 09/08/2014 Document Reviewed: 07/03/2012 Elsevier Interactive Patient Education [employer]

## 2016-08-01 ENCOUNTER — Ambulatory Visit (INDEPENDENT_AMBULATORY_CARE_PROVIDER_SITE_OTHER): Payer: BLUE CROSS/BLUE SHIELD | Admitting: Urgent Care

## 2016-08-01 ENCOUNTER — Encounter: Payer: Self-pay | Admitting: Urgent Care

## 2016-08-01 VITALS — BP 138/88 | HR 82 | Temp 98.7°F | Resp 16 | Ht 62.0 in | Wt 164.6 lb

## 2016-08-01 DIAGNOSIS — R42 Dizziness and giddiness: Secondary | ICD-10-CM | POA: Diagnosis not present

## 2016-08-01 DIAGNOSIS — H8110 Benign paroxysmal vertigo, unspecified ear: Secondary | ICD-10-CM | POA: Diagnosis not present

## 2016-08-01 DIAGNOSIS — R51 Headache: Secondary | ICD-10-CM | POA: Diagnosis not present

## 2016-08-01 DIAGNOSIS — E860 Dehydration: Secondary | ICD-10-CM

## 2016-08-01 DIAGNOSIS — I10 Essential (primary) hypertension: Secondary | ICD-10-CM

## 2016-08-01 DIAGNOSIS — Z8639 Personal history of other endocrine, nutritional and metabolic disease: Secondary | ICD-10-CM

## 2016-08-01 DIAGNOSIS — R519 Headache, unspecified: Secondary | ICD-10-CM

## 2016-08-01 DIAGNOSIS — Z833 Family history of diabetes mellitus: Secondary | ICD-10-CM | POA: Diagnosis not present

## 2016-08-01 DIAGNOSIS — R110 Nausea: Secondary | ICD-10-CM | POA: Diagnosis not present

## 2016-08-01 DIAGNOSIS — R7303 Prediabetes: Secondary | ICD-10-CM | POA: Diagnosis not present

## 2016-08-01 LAB — POCT URINALYSIS DIP (MANUAL ENTRY)
Bilirubin, UA: NEGATIVE
Blood, UA: NEGATIVE
Glucose, UA: NEGATIVE mg/dL
Leukocytes, UA: NEGATIVE
Nitrite, UA: NEGATIVE
Protein Ur, POC: 30 mg/dL — AB
Spec Grav, UA: 1.025 (ref 1.010–1.025)
Urobilinogen, UA: 0.2 E.U./dL
pH, UA: 5.5 (ref 5.0–8.0)

## 2016-08-01 LAB — POCT CBC
Granulocyte percent: 67.8 %G (ref 37–80)
HCT, POC: 49.8 % (ref 43.5–53.7)
Hemoglobin: 17.3 g/dL (ref 14.1–18.1)
Lymph, poc: 2.2 (ref 0.6–3.4)
MCH, POC: 29.9 pg (ref 27–31.2)
MCHC: 34.7 g/dL (ref 31.8–35.4)
MCV: 86.1 fL (ref 80–97)
MID (cbc): 0.4 (ref 0–0.9)
MPV: 6.9 fL (ref 0–99.8)
POC Granulocyte: 5.4 (ref 2–6.9)
POC LYMPH PERCENT: 27.1 %L (ref 10–50)
POC MID %: 5.1 %M (ref 0–12)
Platelet Count, POC: 305 10*3/uL (ref 142–424)
RBC: 5.79 M/uL (ref 4.69–6.13)
RDW, POC: 13.4 %
WBC: 8 10*3/uL (ref 4.6–10.2)

## 2016-08-01 LAB — POCT GLYCOSYLATED HEMOGLOBIN (HGB A1C): Hemoglobin A1C: 6.2

## 2016-08-01 MED ORDER — LISINOPRIL 20 MG PO TABS: 20.0000 mg | ORAL_TABLET | Freq: Every day | ORAL | 1 refills | Status: DC

## 2016-08-01 MED ORDER — MECLIZINE HCL 25 MG PO TABS
25.0000 mg | ORAL_TABLET | Freq: Three times a day (TID) | ORAL | 0 refills | Status: DC | PRN
Start: 2016-08-01 — End: 2017-01-23

## 2016-08-01 NOTE — Progress Notes (Signed)
MRN: [id number] DOB: 1965-03-08  Subjective:   Ethan Silva is a 51 y.o. male presenting for follow up on Hypertension.   Currently managed with amlodipine. Was previously on lisinopril and was switched to amlodipine ~6 months ago. Patient is not checking blood pressure at home. Does not practice a healthy diet, does not exercise. Reports 2 day history of headaches, agitation, nausea without vomiting, chills. Reports that his headaches are frontal, constant, mild, has some mild associated intermittent blurred vision. Has a history of BPPV, lasts several seconds, resolved with steadying his balance. Denies chest pain, shortness of breath, heart racing, palpitations, vomiting, abdominal pain, hematuria, lower leg swelling. Denies smoking cigarettes or drinking alcohol. Does not drink caffeinated beverages. Has occasional soda.  Ethan Silva has a current medication list which includes the following prescription(s): amlodipine, alum & mag hydroxide-simeth, omeprazole, ondansetron, oxycodone-acetaminophen, and tamsulosin. Also has No Known Allergies.  Ethan Silva  has a past medical history of Bile duct leak; Chronic kidney disease (2017); Headache(784.0); Hyperlipidemia; Hypertension; and Numbness and tingling in hands. Also  has a past surgical history that includes Cholecystectomy (N/A, 07/11/2012); ERCP (07/16/2012); ERCP (N/A, 07/16/2012); ERCP (N/A, 10/06/2012); and Colonoscopy. His family history includes Diabetes in his paternal uncle; Gallstones in his brother and mother.   Objective:   Vitals: BP 138/88   Pulse 82   Temp 98.7 F (37.1 C) (Oral)   Resp 16   Ht 5\' 2"  (1.575 m)   Wt 164 lb 9.6 oz (74.7 kg)   SpO2 96%   BMI 30.11 kg/m   Wt Readings from Last 3 Encounters:  08/01/16 164 lb 9.6 oz (74.7 kg)  10/13/15 168 lb (76.2 kg)  08/30/15 169 lb (76.7 kg)   BP Readings from Last 3 Encounters:  08/01/16 138/88  10/13/15 (!) 138/102  10/10/15 (!) 160/101   Orthostatic VS for the past 24  hrs:  BP- Lying Pulse- Lying BP- Sitting Pulse- Sitting BP- Standing at 0 minutes Pulse- Standing at 0 minutes  08/01/16 1550 137/86 73 (!) 147/91 80 (!) 142/92 74   Physical Exam  Constitutional: He is oriented to person, place, and time. He appears well-developed and well-nourished.  HENT:  Mouth/Throat: Oropharynx is clear and moist.  Eyes: Pupils are equal, round, and reactive to light. EOM are normal. No scleral icterus.  Neck: Normal range of motion. Neck supple. No thyromegaly present.  Cardiovascular: Normal rate, regular rhythm and intact distal pulses.  Exam reveals no gallop and no friction rub.   No murmur heard. Pulmonary/Chest: No respiratory distress. He has no wheezes. He has no rales.  Abdominal: Soft. Bowel sounds are normal. He exhibits no distension and no mass. There is no tenderness. There is no guarding.  Musculoskeletal: He exhibits no edema.  Neurological: He is alert and oriented to person, place, and time. He displays normal reflexes. No cranial nerve deficit. Coordination normal.  Negative Romberg and Pronator Drift. Weber test lateralized to the right. Rinne test shows AC>BC.  Skin: Skin is warm and dry.  Psychiatric: He has a normal mood and affect.   Results for orders placed or performed in visit on 08/01/16 (from the past 24 hour(s))  POCT glycosylated hemoglobin (Hb A1C)     Status: None   Collection Time: 08/01/16  4:41 PM  Result Value Ref Range   Hemoglobin A1C 6.2   POCT CBC     Status: None   Collection Time: 08/01/16  4:41 PM  Result Value Ref Range   WBC 8.0 4.6 -  10.2 K/uL   Lymph, poc 2.2 0.6 - 3.4   POC LYMPH PERCENT 27.1 10 - 50 %L   MID (cbc) 0.4 0 - 0.9   POC MID % 5.1 0 - 12 %M   POC Granulocyte 5.4 2 - 6.9   Granulocyte percent 67.8 37 - 80 %G   RBC 5.79 4.69 - 6.13 M/uL   Hemoglobin 17.3 14.1 - 18.1 g/dL   HCT, POC 16.149.8 09.643.5 - 53.7 %   MCV 86.1 80 - 97 fL   MCH, POC 29.9 27 - 31.2 pg   MCHC 34.7 31.8 - 35.4 g/dL   RDW, POC  04.513.4 %   Platelet Count, POC 305 142 - 424 K/uL   MPV 6.9 0 - 99.8 fL  POCT urinalysis dipstick     Status: Abnormal   Collection Time: 08/01/16  4:46 PM  Result Value Ref Range   Color, UA yellow yellow   Clarity, UA clear clear   Glucose, UA negative negative mg/dL   Bilirubin, UA negative negative   Ketones, POC UA trace (5) (A) negative mg/dL   Spec Grav, UA 4.0981.025 1.1911.010 - 1.025   Blood, UA negative negative   pH, UA 5.5 5.0 - 8.0   Protein Ur, POC =30 (A) negative mg/dL   Urobilinogen, UA 0.2 0.2 or 1.0 E.U./dL   Nitrite, UA Negative Negative   Leukocytes, UA Negative Negative   Assessment and Plan :   1. Essential hypertension 2. Generalized headaches 3. Nausea without vomiting 4. Dehydration - Stop amlodipine, start lisinopril. Labs pending. Recheck in 4 weeks. Return-to-clinic precautions discussed, patient verbalized understanding. Counseled patient on potential for adverse effects with medications prescribed today, patient verbalized understanding.  - Advised better hydration, at least 1 gallon of water daily.  5. Dizziness 6. Benign paroxysmal positional vertigo, unspecified laterality - Counseled on diagnosis of BPPV. Patient also has lateralization of Weber test. Will monitor and f/u in 4 weeks as above. - meclizine (ANTIVERT) 25 MG tablet; Take 1 tablet (25 mg total) by mouth 3 (three) times daily as needed for dizziness.  Dispense: 30 tablet; Refill: 0  7. Pre-diabetes 8. Family history of diabetes mellitus - Counseled on lifestyle modifications including diet and exercise.    Ethan BambergMario Charlotta Lapaglia, PA-C [facility] [phone] 08/01/2016  3:44 PM

## 2016-08-01 NOTE — Patient Instructions (Addendum)
Hipertensin Hypertension La hipertensin, conocida comnmente como presin arterial alta, se produce cuando la sangre bombea en las arterias con mucha fuerza. Las arterias son los vasos sanguneos que transportan la sangre desde el corazn al resto del cuerpo. La hipertensin hace que el corazn haga ms esfuerzo para bombear sangre y puede provocar que las arterias se estrechen o endurezcan. La hipertensin no tratada o no controlada puede causar infarto de miocardio, accidentes cerebrovasculares, enfermedad renal y otros problemas. Una lectura de la presin arterial consiste de un nmero ms alto sobre un nmero ms bajo. En condiciones ideales, la presin arterial debe estar por debajo de 120/80. El primer nmero ("superior") es la presin sistlica. Es la medida de la presin de las arterias cuando el corazn late. El segundo nmero ("inferior") es la presin diastlica. Es la medida de la presin en las arterias cuando el corazn se relaja. Cules son las causas? Se desconoce la causa de esta afeccin. Qu incrementa el riesgo? Algunos factores de riesgo de hipertensin estn bajo su control. Otros no. Factores que puede modificar  Fumar.  Tener diabetes mellitus tipo 2, colesterol alto, o ambos.  No hacer la cantidad suficiente de actividad fsica o ejercicio.  Tener sobrepeso.  Consumir mucha grasa, azcar, caloras o sal (sodio) en su dieta.  Beber alcohol en exceso. Factores que son difciles o imposibles de modificar  Tener enfermedad renal crnica.  Tener antecedentes familiares de presin arterial alta.  La edad. Los riesgos aumentan con la edad.  La raza. El riesgo es mayor para las personas afroamericanas.  El sexo. Antes de los 45aos, los hombres corren ms riesgo que las mujeres. Despus de los 65aos, las mujeres corren ms riesgo que los hombres.  Tener apnea obstructiva del sueo.  El estrs. Cules son los signos o los sntomas? La presin arterial  extremadamente alta (crisis hipertensiva) puede provocar:  Dolor de cabeza.  Ansiedad.  Falta de aire.  Hemorragia nasal.  Nuseas y vmitos.  Dolor de pecho intenso.  Una crisis de movimientos que no puede controlar (convulsiones).  Cmo se diagnostica? Esta afeccin se diagnostica midiendo su presin arterial mientras se encuentra sentado, con el brazo apoyado sobre una superficie. El brazalete del tensimetro debe colocarse directamente sobre la piel de la parte superior del brazo y al nivel de su corazn. Debe medirla al menos dos veces en el mismo brazo. Determinadas condiciones pueden causar una diferencia de presin arterial entre el brazo izquierdo y el derecho. Ciertos factores pueden provocar que las lecturas de la presin arterial sean inferiores o superiores a lo normal (elevadas) por un perodo corto de tiempo:  Si su presin arterial es ms alta cuando se encuentra en el consultorio del mdico que cuando la mide en su hogar, se denomina "hipertensin de bata blanca". La mayora de las personas que tienen esta afeccin no deben ser medicadas.  Si su presin arterial es ms alta en el hogar que cuando se encuentra en el consultorio del mdico, se denomina "hipertensin enmascarada". La mayora de las personas que tienen esta afeccin deben ser medicadas para controlar la presin arterial.  Si tiene una lecturas de presin arterial alta durante una visita o si tiene presin arterial normal con otros factores de riesgo:  Es posible que se le pida que regrese otro da para volver a controlar su presin arterial.  Se le puede pedir que se controle la presin arterial en su casa durante 1 semana o ms.  Si se le diagnostica hipertensin, es posible que   se le realicen otros anlisis de sangre o estudios de diagnstico por imgenes para ayudar a su mdico a comprender su riesgo general de tener otras afecciones. Cmo se trata? Esta afeccin se trata haciendo cambios saludables  en el estilo de vida, tales como ingerir alimentos saludables, realizar ms ejercicio y reducir el consumo de alcohol. El mdico puede recetarle medicamentos si los cambios en el estilo de vida no son suficientes para lograr controlar la presin arterial y si:  Su presin arterial sistlica est por encima de 130.  Su presin arterial diastlica est por encima de 80.  La presin arterial deseada puede variar en funcin de las enfermedades, la edad y otros factores personales. Siga estas instrucciones en su casa: Comida y bebida  Siga una dieta con alto contenido de fibras y potasio, y con bajo contenido de sodio, azcar agregada y grasas. Un ejemplo de plan alimenticio es la dieta DASH (Dietary Approaches to Stop Hypertension, Mtodos alimenticios para detener la hipertensin). Para alimentarse de esta manera: ? Coma mucha fruta y verdura fresca. Trate de que la mitad del plato de cada comida sea de frutas y verduras. ? Coma cereales integrales, como pasta integral, arroz integral y pan integral. Llene aproximadamente un cuarto del plato con cereales integrales. ? Coma y beba productos lcteos con bajo contenido de grasa, como leche descremada o yogur bajo en grasas. ? Evite la ingesta de cortes de carne grasa, carne procesada o curada, y carne de ave con piel. Llene aproximadamente un cuarto del plato con protenas magras, como pescado, pollo sin piel, frijoles, huevos y tofu. ? Evite ingerir alimentos prehechos o procesados. En general, estos tienen mayor cantidad de sodio, azcar agregada y grasa.  Reduzca su ingesta diaria de sodio. La mayora de las personas que tienen hipertensin deben comer menos de 1500 mg de sodio por da.  Limite el consumo de alcohol a no ms de 1 medida por da si es mujer y no est embarazada y a 2 medidas por da si es hombre. Una medida equivale a 12onzas de cerveza, 5onzas de vino o 1onzas de bebidas alcohlicas de alta graduacin. Estilo de vida  Trabaje  con su mdico para mantener un peso saludable o perder peso. Pregntele cual es su peso recomendado.  Realice al menos 30 minutos de ejercicio que haga que se acelere su corazn (ejercicio aerbico) la mayora de los das de la semana. Estas actividades pueden incluir caminar, nadar o andar en bicicleta.  Incluya ejercicios para fortalecer sus msculos (ejercicios de resistencia), como pilates o levantamiento de pesas, como parte de su rutina semanal de ejercicios. Intente realizar 30minutos de este tipo de ejercicios al menos tres das a la semana.  No consuma ningn producto que contenga nicotina o tabaco, como cigarrillos y cigarrillos electrnicos. Si necesita ayuda para dejar de fumar, consulte al mdico.  Contrlese la presin arterial en su casa segn las indicaciones del mdico.  Concurra a todas las visitas de control como se lo haya indicado el mdico. Esto es importante. Medicamentos  Tome los medicamentos de venta libre y los recetados solamente como se lo haya indicado el mdico. Siga cuidadosamente las indicaciones. Los medicamentos para la presin arterial deben tomarse segn las indicaciones.  No omita las dosis de medicamentos para la presin arterial. Si lo hace, estar en riesgo de tener problemas y puede hacer que los medicamentos sean menos eficaces.  Pregntele a su mdico a qu efectos secundarios o reacciones a los medicamentos debe prestar atencin. Comunquese con   un mdico si:  Piensa que tiene una reaccin a un medicamento que est tomando.  Tiene dolores de cabeza frecuentes (recurrentes).  Siente mareos.  Tiene hinchazn en los tobillos.  Tiene problemas de visin. Solicite ayuda de inmediato si:  Siente un dolor de cabeza intenso o confusin.  Siente debilidad inusual o adormecimiento.  Siente que va a desmayarse.  Siente un dolor intenso en el pecho o el abdomen.  Vomita repetidas veces.  Tiene dificultad para respirar. Resumen  La  hipertensin se produce cuando la sangre bombea en las arterias con mucha fuerza. Si esta afeccin no se controla, podra correr riesgo de tener complicaciones graves.  La presin arterial deseada puede variar en funcin de las enfermedades, la edad y otros factores personales. Para la [employer], una presin arterial normal es menor que 120/80.  La hipertensin se trata con cambios en el estilo de vida, medicamentos o una combinacin de [place]. Los [employer] estilo de vida incluyen prdida de peso, ingerir alimentos sanos, seguir una dieta baja en sodio, hacer ms ejercicio y Glass blower/designer consumo de alcohol. Esta informacin no tiene Theme park manager el consejo del mdico. Asegrese de hacerle al mdico cualquier pregunta que tenga. Document Released: 12/18/2004 Document Revised: 11/30/2015 Document Reviewed: 11/30/2015 Elsevier Interactive Patient Education  2018 [employer].     Vrtigo posicional benigno (Benign Positional Vertigo) El vrtigo es la sensacin de que usted o todo lo que lo rodea se mueven cuando en realidad eso no sucede. El vrtigo posicional benigno es el tipo de vrtigo ms comn. La causa de este trastorno no es grave (es benigna). Algunos movimientos y determinadas posiciones pueden desencadenar el trastorno (es posicional). El vrtigo posicional benigno puede ser peligroso si ocurre mientras est haciendo algo que podra suponer un riesgo para usted y para los dems, por ejemplo, conduciendo un automvil. CAUSAS En muchos de los [place], se desconoce la causa de este trastorno. Puede deberse a Hotel manager zona del odo interno que ayuda al cerebro a percibir el movimiento y a Administrator, Civil Service equilibrio. Esta alteracin puede deberse a una infeccin viral (laberintitis), a una lesin en la cabeza o a los movimientos reiterados. FACTORES DE RIESGO Es ms probable que esta afeccin se manifieste en:  Las mujeres.  Las [employer] de  [id number]. SNTOMAS Generalmente, los sntomas de este trastorno se presentan al mover la cabeza o los ojos en diferentes direcciones. Pueden aparecer repentinamente y suelen durar menos de un minuto. Entre los sntomas se pueden incluir los siguientes:  Prdida del equilibrio y cadas.  Sensacin de estar dando vueltas o movindose.  Sensacin de que el entorno est dando vueltas o movindose.  Nuseas y vmitos.  Visin borrosa.  Mareos.  Movimientos oculares involuntarios (nistagmo). Los sntomas pueden ser leves y algo fastidiosos, o pueden ser graves e interferir en la vida cotidiana. Los episodios de vrtigo posicional benigno pueden repetirse (ser recurrentes) a lo largo del [place], y algunos movimientos pueden desencadenarlos. Los sntomas pueden mejorar con Museum/gallery conservator. DIAGNSTICO Generalmente, este trastorno se diagnostica con una historia clnica y un examen fsico de la cabeza, el cuello y los odos. Tal vez lo deriven a Catering manager en problemas de la garganta, la nariz y el odo (otorrinolaringlogo), o a uno que se especializa en trastornos del sistema nervioso (neurlogo). Pueden hacerle otros estudios, entre ellos:  Health visitor.  Tomografa computarizada.  Estudios de los [employer]. El mdico puede pedirle que cambie rpidamente de  posicin mientras observa si se presentan sntomas de vrtigo posicional benigno, por ejemplo, nistagmo. Los movimientos oculares se pueden estudiar con una electronistagmografa (ENG), con estimulacin trmica, mediante la maniobra de [place] o con la prueba de rotacin.  Electroencefalograma (EEG). Este estudio registra la actividad elctrica del cerebro.  Pruebas de audicin. Lissa MoralesRATAMIENTO Generalmente, para tratar este trastorno, el mdico le har movimientos especficos con la cabeza para que el odo interno se normalice. Mohawk IndustriesCuando los casos son graves, tal vez haya que realizar una ciruga, pero esto no es  frecuente. En algunos casos, el vrtigo posicional benigno se resuelve por s solo en el trmino de 2 o 4semanas. INSTRUCCIONES PARA EL CUIDADO EN EL HOGAR Seguridad  Muvase lentamente.No haga movimientos bruscos con el cuerpo o con la cabeza.  No conduzca.  No opere maquinaria pesada.  No haga ninguna tarea que podra ser peligrosa para usted o para Economistotras personas en caso de que ocurriera un episodio de vrtigo.  Si tiene dificultad para caminar o mantener el equilibrio, use un bastn para Photographermantener la estabilidad. Si se siente mareado o inestable, sintese de inmediato.  Reanude sus actividades normales como se lo haya indicado el mdico. Pregntele al mdico qu actividades son seguras para usted. Instrucciones generales  Baxter Internationalome los medicamentos de venta libre y los recetados solamente como se lo haya indicado el mdico.  Evite algunas posiciones o determinados movimientos como se lo haya indicado el mdico.  Beba suficiente lquido para Pharmacologistmantener la orina clara o de color amarillo plido.  Concurra a todas las visitas de control como se lo haya indicado el mdico. Esto es importante. SOLICITE ATENCIN MDICA SI:  Lance Mussiene fiebre.  El trastorno Falls Viewempeora, o le aparecen sntomas nuevos.  Sus familiares o amigos advierten cambios en su comportamiento.  Las nuseas o los vmitos empeoran.  Tiene sensacin de hormigueo o de adormecimiento. SOLICITE ATENCIN MDICA DE INMEDIATO SI:  Tiene dificultad para hablar o para moverse.  Esta mareado todo Allied Waste Industriesel tiempo.  Se desmaya.  Tiene dolores de cabeza intensos.  Tiene debilidad en los brazos o las piernas.  Tiene cambios en la audicin o la visin.  Siente rigidez en el cuello.  Tiene sensibilidad a Statisticianla luz. Esta informacin no tiene Theme park managercomo fin reemplazar el consejo del mdico. Asegrese de hacerle al mdico cualquier pregunta que tenga. Document Released: 04/05/2008 Document Revised: 09/08/2014 Document Reviewed:  04/12/2014 [name]     Prevencin de la diabetes mellitus tipo2 (Preventing Type 2 Diabetes Mellitus) La diabetes tipo2 (diabetes mellitus tipo2) es una enfermedad a largo plazo (crnica) que afecta los niveles de azcar en la sangre (glucosa). Normalmente, una hormona llamada insulina estimula el ingreso de la glucosa en las clulas del cuerpo. Las clulas usan la glucosa para Psychiatristobtener energa. En la diabetes tipo2, puede presentarse uno de los [address] South Third Streetsiguientes problemas, o ambos:  El organismo no produce la cantidad suficiente de [place] Patchogueinsulina.  El organismo no responde de Nicaraguamanera adecuada a la insulina que produce (resistencia a la insulina). La resistencia a la insulina o la falta de esta hormona hace que el exceso de glucosa se acumule en la sangre, en lugar de ir a las clulas. Como consecuencia, se desarrolla glucemia alta (hiperglucemia), que puede causar muchas complicaciones. El sobrepeso o la obesidad, y Midwifellevar un estilo de vida inactivo (sedentario) pueden aumentar el riesgo de tener diabetes. La diabetes tipo2 se puede retardar o evitar al realizar ciertos cambios en la alimentacin y en el estilo de vida.  QU CAMBIOS EN LA ALIMENTACIN SE PUEDEN HACER?  Consuma comidas y colaciones saludables regularmente. Lleve con usted una colacin saludable para cuando tenga US Airwayshambre entre las comidas, por [place]evilleejemplo, una fruta o un puado de frutos secos.  Coma carne San Marinomagra y protenas con bajo contenido de grasas saturadas, como pollo, pescado, huevos blancos y frijoles. Evite las carnes procesadas.  Coma mucha fruta y verdura, y Neomia Dearuna cantidad importante de cereales no procesados (cereales integrales). Se recomienda que consuma lo siguiente: ? De 1 a 2tazas de frutas CarMaxtodos los das. ? De 2 a 3tazas de Sanmina-SCIverduras todos los das. ? Jordan HawksDe 6onzas (170g) a 8onzas (227g) de cereales integrales todos los Lathropdas, como avena, Hot Springssalvado, trigo [place] Southbulgur, arroz  integral, quinua y mijo.  Productos lcteos con bajo contenido de Loganvillegrasa, [place] Parkcomo leche, yogur y Bradenqueso.  Alimentos que contengan grasas saludables, como frutos secos, Chartered certified accountantaguacate, aceite de Erdaoliva y aceite de canola.  Beba agua durante todo Medical laboratory scientific officerel da. Evite bebidas que contengan ms azcar, como gaseosas y t Holualoadulce.  Siga las indicaciones del mdico con respecto a las restricciones especficas para las comidas o bebidas.  Controle la cantidad de comida que consume en un momento dado (tamao de la porcin). ? Revise las etiquetas de los alimentos para conocer el tamao de la porcin. ? Utilice una balanza de cocina para pesar las cantidades de alimentos.  [place] o cocine al vapor los alimentos en vez de frerlos. Cocine con agua o caldo en vez de aceite o manteca.  Limite la ingesta de lo siguiente: ? Sal (sodio). No consuma ms de 1cucharadita (2400mg ) de Pharmacist, communitysodio por da. Si tiene Jerseyalguna cardiopata o hipertensin arterial, consuma menos de  o de cucharadita (1500mg ) de sodio por da. ? Grasas saturadas. Es la grasa que se encuentra en estado slido a temperatura ambiente, como la Sheltonmanteca o la grasa de la carne. QU CAMBIOS EN EL ESTILO DE VIDA SE PUEDEN HACER? Actividad  Haga actividad fsica de intensidad moderada durante al menos 30minutos como mnimo 5das por semana, o tanto como le haya indicado el mdico.  Pregntele al mdico qu actividades son seguras para usted. Una combinacin de actividades puede ser la mejor opcin, por ejemplo, caminar, practicar natacin, andar en bicicleta y hacer entrenamiento de fuerza.  Trate de agregar la actividad fsica a Journalist, newspapersu da. Por ejemplo: ? Estacione en lugares que estn ms alejados de lo habitual para poder caminar ms. Por ejemplo, estacione en una esquina alejada del estacionamiento cuando vaya a la oficina o a la tienda de comestibles. ? D una caminata durante su hora de almuerzo. ? Utilice las Microbiologistescaleras en lugar del ascensor o de las  escaleras mecnicas. Prdida de peso  Baje de peso segn se le indique. El mdico puede determinar cuntos kilos tiene que bajar y Jacksonburgayudarlo a que adelgace de Wellsite geologistmanera segura.  Si tiene sobrepeso u obesidad, es posible que se le indique bajar, por lo menos del 5% al 7% del Runner, broadcasting/film/videopeso corporal. Alcohol y tabaco  Limite el consumo de alcohol a no ms de 1 medida por da si es mujer y no est Orthoptistembarazada, y 2 medidas por da si es hombre. Una medida equivale a 12onzas de cerveza, 5onzas de vino o 1onzas de bebidas alcohlicas de alta graduacin.  No consuma ningn producto que contenga tabaco, lo que incluye cigarrillos, tabaco de Theatre managermascar y Administrator, Civil Servicecigarrillos electrnicos. Si necesita ayuda para dejar de fumar, consulte al American Expressmdico. Coopere con el mdico  Contrlese el nivel sanguneo de glucosa con frecuencia como  se lo haya indicado el mdico.  Analice los factores de riesgo y cmo puede reducir el riesgo de tener diabetes.  Hgase las pruebas de Lowe[employer] Companies se lo haya indicado el mdico. Puede hacerse pruebas de deteccin de forma peridica, especialmente si presenta ciertos factores de riesgo para la diabetes tipo2.  Haga una cita con un especialista en alimentacin y nutricin (nutricionista certificado). Un nutricionista certificado puede ayudarlo a preparar un plan de alimentacin saludable, y a comprender los tamaos de las porciones y las etiquetas de los alimentos. POR QU ESTOS CAMBIOS SON IMPORTANTES?  Al hacer cambios en el estilo de vida y la alimentacin, es posible prevenir o retardar la diabetes tipo2 y los problemas de salud relacionados.  Puede ser difcil reconocer los signos de la diabetes tipo2. La mejor manera de evitar los posibles daos al organismo es tomar medidas para prevenir la enfermedad antes de presentar sntomas. QU PUEDE SUCEDER SI NO SE REALIZAN CAMBIOS?  Los niveles sanguneos de glucosa pueden seguir aumentando. Es peligroso Public librarian glucemia alta durante  mucho tiempo. Demasiada glucosa en la sangre puede daar los vasos sanguneos, el corazn, los riones, los nervios y los ojos.  Puede desarrollar prediabetes o diabetes tipo2. La diabetes tipo2 puede producir muchos problemas de salud crnicos y complicaciones, por ejemplo: ? Cardiopata. ? Ictus. ? Ceguera. ? Enfermedad renal. ? Depresin. ? Mala [employer] y en las piernas, que podra llevar a la extraccin quirrgica (amputacin) en casos graves. DNDE ENCONTRAR ASISTENCIA:  Pdale al mdico que le recomiende a un nutricionista certificado, a IT trainer para el cuidado de la diabetes o un programa para Publishing copy de [place].  Busque grupos para bajar de peso locales o en lnea.  Inscrbase en un gimnasio, club de preparacin fsica o grupo de actividades al [employer], [place] un club para salir a Advertising account planner. DNDE ENCONTRAR MS INFORMACIN: Para obtener ms informacin sobre la diabetes y la prevencin de la diabetes, visite los siguientes sitios web:  Asociacin Americana de la Diabetes (American Diabetes Association, ADA): www.diabetes.org  [employer]ger de la Diabetes y las Enfermedades Digestivas y [facility] of Diabetes and Digestive and Kidney Diseases): ToyArticles.ca Para obtener ms informacin sobre una alimentacin saludable, visite los siguientes sitios web:  Choose My Plate (MiPlato), Departamento de Agricultura de EE.UU. (U.S. Department of Agriculture, Medical sales representative): http://yates.biz/  [employer] Dietary Guidelines ([place]as de Paediatric nurse) de la Oficina de Prevencin de Enfermedades y Promocin de Radiographer, therapeutic (Office of Disease Prevention and Health Promotion, [place]): ListingMagazine.si Resumen  Puede reducir el riesgo de desarrollar diabetes tipo2 al aumentar la actividad fsica, comer alimentos saludables y Publishing copy de [place], segn se le indique.  Hable con el mdico sobre el riesgo de  desarrollar diabetes tipo2. Pregntele Amgen Inc de sangre o las pruebas de deteccin que deba [place]. Esta informacin no tiene Theme park manager el consejo del mdico. Asegrese de hacerle al mdico cualquier pregunta que tenga. Document Released: 02/08/2015 Document Revised: 02/08/2015 Document Reviewed: 02/08/2015 Elsevier Interactive Patient Education  2018 ArvinMeritor.    IF you received an x-ray today, you will receive an invoice from [facility]. Please contact [facility] at [phone] with questions or concerns regarding your invoice.   IF you received labwork today, you will receive an invoice from [place]. Please contact [facility] at [phone] with questions or concerns regarding your invoice.   Our billing staff will not be able to assist you with questions regarding bills from these companies.  You will be  contacted with the lab results as soon as they are available. The fastest way to get your results is to activate your My Chart account. Instructions are located on the last page of this paperwork. If you have not heard from [place] regarding the results in 2 weeks, please contact this office.

## 2016-08-02 LAB — LIPID PANEL
Chol/HDL Ratio: 7.4 ratio — ABNORMAL HIGH (ref 0.0–5.0)
Cholesterol, Total: 273 mg/dL — ABNORMAL HIGH (ref 100–199)
HDL: 37 mg/dL — ABNORMAL LOW (ref >39)
LDL Calculated: 157 mg/dL — ABNORMAL HIGH (ref 0–99)
Triglycerides: 397 mg/dL — ABNORMAL HIGH (ref 0–149)
VLDL Cholesterol Cal: 79 mg/dL — ABNORMAL HIGH (ref 5–40)

## 2016-08-02 LAB — COMPREHENSIVE METABOLIC PANEL
ALT: 44 IU/L (ref 0–44)
AST: 30 IU/L (ref 0–40)
Albumin/Globulin Ratio: 1.5 (ref 1.2–2.2)
Albumin: 4.5 g/dL (ref 3.5–5.5)
Alkaline Phosphatase: 64 IU/L (ref 39–117)
BUN/Creatinine Ratio: 14 (ref 9–20)
BUN: 19 mg/dL (ref 6–24)
Bilirubin Total: 0.3 mg/dL (ref 0.0–1.2)
CO2: 24 mmol/L (ref 20–29)
Calcium: 9.4 mg/dL (ref 8.7–10.2)
Chloride: 98 mmol/L (ref 96–106)
Creatinine, Ser: 1.32 mg/dL — ABNORMAL HIGH (ref 0.76–1.27)
GFR calc Af Amer: 71 mL/min/{1.73_m2} (ref >59)
GFR calc non Af Amer: 61 mL/min/{1.73_m2} (ref >59)
Globulin, Total: 3 g/dL (ref 1.5–4.5)
Glucose: 111 mg/dL — ABNORMAL HIGH (ref 65–99)
Potassium: 3.2 mmol/L — ABNORMAL LOW (ref 3.5–5.2)
Sodium: 141 mmol/L (ref 134–144)
Total Protein: 7.5 g/dL (ref 6.0–8.5)

## 2016-08-02 LAB — TSH: TSH: 0.766 u[IU]/mL (ref 0.450–4.500)

## 2016-08-08 ENCOUNTER — Other Ambulatory Visit: Payer: Self-pay | Admitting: Urgent Care

## 2016-08-08 MED ORDER — ATORVASTATIN CALCIUM 20 MG PO TABS: 20.0000 mg | ORAL_TABLET | Freq: Every day | ORAL | 1 refills | Status: DC

## 2016-09-06 ENCOUNTER — Ambulatory Visit (INDEPENDENT_AMBULATORY_CARE_PROVIDER_SITE_OTHER): Payer: BLUE CROSS/BLUE SHIELD | Admitting: Urgent Care

## 2016-09-06 ENCOUNTER — Encounter: Payer: Self-pay | Admitting: Urgent Care

## 2016-09-06 VITALS — BP 156/90 | HR 56 | Temp 98.0°F | Resp 16 | Ht 62.6 in | Wt 165.0 lb

## 2016-09-06 DIAGNOSIS — R030 Elevated blood-pressure reading, without diagnosis of hypertension: Secondary | ICD-10-CM | POA: Diagnosis not present

## 2016-09-06 DIAGNOSIS — H8110 Benign paroxysmal vertigo, unspecified ear: Secondary | ICD-10-CM | POA: Diagnosis not present

## 2016-09-06 DIAGNOSIS — I10 Essential (primary) hypertension: Secondary | ICD-10-CM

## 2016-09-06 MED ORDER — METFORMIN HCL 500 MG PO TABS: 500.0000 mg | ORAL_TABLET | Freq: Every day | ORAL | 1 refills | Status: DC

## 2016-09-06 MED ORDER — AMLODIPINE BESYLATE 5 MG PO TABS: 5.0000 mg | ORAL_TABLET | Freq: Every day | ORAL | 1 refills | Status: DC

## 2016-09-06 NOTE — Progress Notes (Signed)
   MRN: [id number] DOB: [birth year]  Subjective:   Ethan Silva is a 51 y.o. male presenting for chief complaint of Hypertension (1 month follow-up) and Dizziness  HTN - Managed with lisinopril 20mg  daily. Has episodes of vertigo but not since his last office visit. Denies chronic headache, chest pain, shortness of breath, heart racing, palpitations, nausea, vomiting, abdominal pain, hematuria, lower leg swelling. Denies smoking cigarettes.   Ethan Silva has a current medication list which includes the following prescription(s): atorvastatin, lisinopril, and meclizine. Also has No Known Allergies.  Ethan Silva  has a past medical history of Bile duct leak; Chronic kidney disease (2017); Headache(784.0); Hyperlipidemia; Hypertension; and Numbness and tingling in hands. Also  has a past surgical history that includes Cholecystectomy (N/A, 07/11/2012); ERCP (07/16/2012); ERCP (N/A, 07/16/2012); ERCP (N/A, 10/06/2012); and Colonoscopy.  Objective:   Vitals: BP (!) 156/90   Pulse (!) 56   Temp 98 F (36.7 C) (Oral)   Resp 16   Ht 5' 2.6" (1.59 m)   Wt 165 lb (74.8 kg)   SpO2 98%   BMI 29.61 kg/m   BP Readings from Last 3 Encounters:  09/06/16 (!) 156/90  08/01/16 138/88  10/13/15 (!) 138/102   Physical Exam  Constitutional: He is oriented to person, place, and time. He appears well-developed and well-nourished.  HENT:  Mouth/Throat: Oropharynx is clear and moist.  Eyes: No scleral icterus.  Cardiovascular: Normal rate, regular rhythm and intact distal pulses.  Exam reveals no gallop and no friction rub.   No murmur heard. Pulmonary/Chest: No respiratory distress. He has no wheezes. He has no rales.  Musculoskeletal: He exhibits no edema.  Neurological: He is alert and oriented to person, place, and time.  Skin: Skin is warm and dry.  Psychiatric: He has a normal mood and affect.   Assessment and Plan :   1. Benign paroxysmal positional vertigo, unspecified laterality - Stable, counseled on  Epley maneuvers. Use meclizine as needed. Return-to-clinic precautions discussed, patient verbalized understanding.   2. Essential hypertension 3. Elevated blood pressure reading - Maintain lisinopril, add amlodipine. We did not have lab staff during this patient's office visit and were unable to obtain a blood sample for his labs. He will come in for future labs when we might have lab staff to draw his labs.  Wallis BambergMario Vanassa Penniman, PA-C [facility] [phone] 09/06/2016  6:09 PM

## 2016-09-06 NOTE — Patient Instructions (Addendum)
Maniobra de Engineer, petroleum, cuidado personal Immunologist Self-Care) QU ES LA MANIOBRA DE EPLEY? La Yahoo de Epley es un ejercicio que puede Optometrist para Public house manager los sntomas del vrtigo posicional paroxstico benigno (VPPB). Esta afeccin a menudo se conoce como vrtigo. El movimiento de unos pequeos cristales (canalculos) dentro del odo interno ocasiona el VPPB. La acumulacin y el movimiento de los canalculos en el odo interno ocasionan una repentina sensacin de aceleracin (vrtigo) cuando se mueve la cabeza en ciertas posiciones. El vrtigo por lo general dura unos 30das. El VPPB normalmente ocurre solo en un odo. Si siente vrtigo cuando se recuesta sobre el lado izquierdo, probablemente tenga VPPB en el odo izquierdo. El mdico le puede decir qu odo est involucrado. Una lesin en la cabeza puede ocasionar el VPPB. Muchas personas de ms de 50aos tienen VPPB por motivos desconocidos. Si se le diagnostic VPPB, el mdico puede ensearle a Runner, broadcasting/film/video. El VPPB no es potencialmente mortal (benigno) y normalmente se pasa con el tiempo. [place]? Puede realizar [employer] en su casa cuando tenga los sntomas de vrtigo. Puede realizar Neurosurgeon de Epley hasta 3veces en un da hasta que los sntomas de vrtigo desaparezcan. Luce DE EPLEY? 1. Sintese en el borde de una cama o una mesa con la espalda recta. Las piernas deben estar extendidas o colgando sobre el borde de la cama o la mesa. 2. Gire la cabeza a medias hacia el lado del odo afectado. 3. Recustese hacia atrs con la cabeza girada hasta que se encuentre recostado sobre la espalda. Quizs quiera colocar una almohada debajo de los hombros. 4. Mantenga esta posicin durante 30segundos. Es posible que experimente un ataque de vrtigo. Esto es normal. Mantenga esta posicin hasta que el vrtigo desaparezca. 5. Luego gire la cabeza en direccin opuesta  hasta que el odo no afectado est orientado al suelo. 6. Mantenga esta posicin durante 30segundos. Es posible que experimente un ataque de vrtigo. Esto es normal. Mantenga esta posicin hasta que el vrtigo desaparezca. 7. Ahora gire todo el cuerpo hacia el mismo lado que la cabeza. Mantenga esta posicin durante otros 30segundos. 8. Luego, vuelva a sentarse.  ESTA MANIOBRA PRESENTA RIESGOS? En algunos casos, puede tener otros sntomas (como cambios en la visin, debilidad o entumecimiento). Si tiene estos sntomas, deje de Statistician y llame al mdico. Memory Dance si realizar esta maniobra lo [place] del vrtigo, es posible que sienta mareos. El mareo es la sensacin de desvanecimiento pero sin la sensacin de [place]. Aunque la Yahoo de Engineer, petroleum lo [place] del vrtigo, es posible que los sntomas vuelvan durante los siguientes 5aos. QU DEBO HACER DESPUS DE ESTA [place]? Puede retomar sus actividades normales despus de Optometrist la [place] de Engineer, petroleum. Pregntele al mdico si debe hacer algo en su casa para prevenir el vrtigo. Esta puede incluir:  Dormir con dos o ms almohadas para Theatre manager la cabeza elevada.  No dormir sobre el lado del odo afectado.  Levantarse lentamente de la cama.  Evitar los movimientos repentinos Agricultural consultant.  Evitar los movimientos de cabeza intensos, como mirar hacia arriba o Office manager.  Utilizar un collar cervical para evitar los movimientos de cabeza repentinos. [place] SI LOS SNTOMAS EMPEORAN? Llame al mdico si el vrtigo empeora. Llame al mdico inmediatamente si tiene otros sntomas, incluidos:  Nuseas.  Vmitos.  Dolor de [place].  Debilidad.  Entumecimiento.  Cambios en la visin. Esta informacin no tiene Marine scientist  el consejo del mdico. Asegrese de hacerle al mdico cualquier pregunta que tenga. Document Released: 12/23/2012 Document Revised: 12/23/2012 Document Reviewed: 11/11/2012 Elsevier  Interactive Patient Education  2017 Elsevier Inc.    Hipertensin Hypertension La hipertensin, conocida comnmente como presin arterial alta, se produce cuando la sangre bombea en las arterias con mucha fuerza. Las arterias son los vasos sanguneos que transportan la sangre desde el corazn al resto del cuerpo. La hipertensin hace que el corazn haga ms esfuerzo para Insurance account manager y Sears Holdings Corporation que las arterias se Armed forces training and education officer o Multimedia programmer. La hipertensin no tratada o no controlada puede causar infarto de miocardio, accidentes cerebrovasculares, enfermedad renal y otros problemas. Una lectura de la presin arterial consiste de un nmero ms alto sobre un nmero ms bajo. En condiciones ideales, la presin arterial debe estar por debajo de 120/80. El primer nmero ("superior") es la presin sistlica. Es la medida de la presin de las arterias cuando el corazn late. El segundo nmero ("inferior") es la presin diastlica. Es la medida de la presin en las arterias cuando el corazn se relaja. Cules son las causas? Se desconoce la causa de esta afeccin. Qu incrementa el riesgo? Algunos factores de riesgo de hipertensin estn bajo su control. Otros no. Factores que puede Omnicom.  Tener diabetes mellitus tipo 2, colesterol alto, o ambos.  No hacer la cantidad suficiente de actividad fsica o ejercicio.  Tener sobrepeso.  Consumir mucha grasa, azcar, caloras o sal (sodio) en su dieta.  Beber alcohol en exceso. Factores que son difciles o imposibles de modificar  Tener enfermedad renal crnica.  Tener antecedentes familiares de presin arterial alta.  La edad. Los riesgos aumentan con la edad.  La raza. El riesgo es mayor para las Statistician.  El sexo. Antes de los 45aos, los hombres corren ms [employer]. Despus de los 65aos, las mujeres corren ms Lex[employer] International.  Tener apnea obstructiva del sueo.  El  estrs. Cules son los signos o los sntomas? La presin arterial extremadamente alta (crisis hipertensiva) puede provocar:  Dolor de [place].  Ansiedad.  Falta de aire.  Hemorragia nasal.  Nuseas y vmitos.  Dolor de pecho intenso.  Una crisis de movimientos que no puede controlar (convulsiones).  Cmo se diagnostica? Esta afeccin se diagnostica midiendo su presin arterial mientras se encuentra sentado, con el brazo apoyado sobre una superficie. El brazalete del tensimetro debe colocarse directamente sobre la piel de la parte superior del brazo y al nivel de su corazn. Debe medirla al [facility] veces en el mismo brazo. Determinadas condiciones pueden causar una diferencia de presin arterial entre el brazo izquierdo y Aeronautical engineer. Ciertos factores pueden provocar que las lecturas de la presin arterial sean inferiores o superiores a lo normal (elevadas) por un perodo corto de tiempo:  Si su presin arterial es ms alta cuando se encuentra en el consultorio del mdico que cuando la mide en su hogar, se denomina "hipertensin de bata blanca". La Harley-Davidson de las personas que tienen esta afeccin no deben ser Engelhard Corporation.  Si su presin arterial es ms alta en el hogar que cuando se encuentra en el consultorio del mdico, se denomina "hipertensin enmascarada". La Harley-Davidson de las personas que tienen esta afeccin deben ser medicadas para Chief Operating Officer la presin arterial.  Si tiene una lecturas de presin arterial alta durante una visita o si tiene presin arterial normal con otros factores de riesgo:  Es posible que se le pida que regrese Banker para  volver a Chief Operating Officer su presin arterial.  Se le puede pedir que se controle la presin arterial en su casa durante 1 semana o ms.  Si se le diagnostica hipertensin, es posible que se le realicen otros anlisis de sangre o estudios de diagnstico por imgenes para ayudar a su mdico a comprender su riesgo general de tener otras  afecciones. Cmo se trata? Esta afeccin se trata haciendo cambios saludables en el estilo de vida, tales como ingerir alimentos saludables, realizar ms ejercicio y reducir el consumo de alcohol. El mdico puede recetarle medicamentos si los cambios en el estilo de vida no son suficientes para Museum/gallery curator la presin arterial y si:  Su presin arterial sistlica est por encima de 130.  Su presin arterial diastlica est por encima de 80.  La presin arterial deseada puede variar en funcin de las enfermedades, la edad y otros factores personales. Siga estas instrucciones en su casa: Comida y bebida  Siga una dieta con alto contenido de fibras y [place], y con bajo contenido de sodio, International aid/development worker agregada y Neurosurgeon. Un ejemplo de plan alimenticio es la dieta DASH (Dietary Approaches to Stop Hypertension, Mtodos alimenticios para detener la hipertensin). Para alimentarse de esta manera: ? Coma mucha fruta y verdura fresca. Trate de que la mitad del plato de cada comida sea de frutas y verduras. ? Coma cereales integrales, como pasta integral, arroz integral y pan integral. Llene aproximadamente un cuarto del plato con cereales integrales. ? Coma y beba productos lcteos con bajo contenido de grasa, como leche descremada o yogur bajo en grasas. ? Evite la ingesta de cortes de carne grasa, carne procesada o curada, y carne de ave con piel. Llene aproximadamente un cuarto del plato con protenas magras, como pescado, pollo sin piel, frijoles, huevos y tofu. ? Evite ingerir alimentos prehechos o procesados. En general, estos tienen mayor cantidad de sodio, azcar agregada y Steffanie [place].  Reduzca su ingesta diaria de sodio. La mayora de las personas que tienen hipertensin deben comer menos de 1500 mg de sodio por [employer] Worldwide.  Limite el consumo de alcohol a no ms de 1 medida por da si es mujer y no est Orthoptist y a 2 medidas por da si es hombre. Una medida equivale a 12onzas de cerveza, 5onzas de vino  o 1onzas de bebidas alcohlicas de alta graduacin. Estilo de vida  Trabaje con su mdico para mantener un peso saludable o Curator. Pregntele cual es su peso recomendado.  Realice al menos 30 minutos de ejercicio que haga que se acelere su corazn (ejercicio Magazine features editor) la DIRECTV de la [place]. Estas actividades pueden incluir caminar, nadar o andar en bicicleta.  Incluya ejercicios para fortalecer sus msculos (ejercicios de resistencia), como pilates o levantamiento de pesas, como parte de su rutina semanal de ejercicios. Intente realizar de este tipo de ejercicios al Kellogg a la [place].  No consuma ningn producto que contenga nicotina o tabaco, como cigarrillos y Administrator, Civil Service. Si necesita ayuda para dejar de fumar, consulte al mdico.  Contrlese la presin arterial en su casa segn las indicaciones del mdico.  Concurra a todas las visitas de control como se lo haya indicado el mdico. Esto es importante. Medicamentos  Baxter International de venta libre y los recetados solamente como se lo haya indicado el mdico. Siga cuidadosamente las indicaciones. Los medicamentos para la presin arterial deben tomarse segn las indicaciones.  No omita las dosis de medicamentos para la presin arterial. Si lo hace,  estar en riesgo de tener problemas y puede hacer que los medicamentos sean menos eficaces.  Pregntele a su mdico a qu efectos secundarios o reacciones a los Museum/gallery curator. Comunquese con un mdico si:  Piensa que tiene una reaccin a un medicamento que est tomando.  Tiene dolores de cabeza frecuentes (recurrentes).  Siente mareos.  Tiene hinchazn en los tobillos.  Tiene problemas de visin. Solicite ayuda de inmediato si:  Siente un dolor de cabeza intenso o confusin.  Siente debilidad inusual o adormecimiento.  Siente que va a desmayarse.  Siente un dolor intenso en el pecho o el  abdomen.  Vomita repetidas veces.  Tiene dificultad para respirar. Resumen  La hipertensin se produce cuando la sangre bombea en las arterias con mucha fuerza. Si esta afeccin no se controla, podra correr riesgo de tener complicaciones graves.  La presin arterial deseada puede variar en funcin de las enfermedades, la edad y otros factores personales. Para la [employer] Resources, una presin arterial normal es menor que 120/80.  La hipertensin se trata con cambios en el estilo de vida, medicamentos o una combinacin de [place]. Los Danaher Corporation estilo de vida incluyen prdida de peso, ingerir alimentos sanos, seguir una dieta baja en sodio, hacer ms ejercicio y Glass blower/designer consumo de alcohol. Esta informacin no tiene Theme park manager el consejo del mdico. Asegrese de hacerle al mdico cualquier pregunta que tenga. Document Released: 12/18/2004 Document Revised: 11/30/2015 Document Reviewed: 11/30/2015 Elsevier Interactive Patient Education  2018 ArvinMeritor.    Plan de alimentacin para la prediabetes (Prediabetes Eating Plan) La prediabetes, tambin llamada intolerancia a la glucosa o alteracin de la glucosa en ayunas, es una afeccin que eleva los niveles de azcar en la sangre (glucemia) por encima de lo normal. Seguir una dieta saludable puede ayudar a mantener la prediabetes bajo control, y tambin reduce el riesgo de tener diabetes tipo2 y cardiopata, que es ms alto en las personas que tienen esta afeccin. Junto con la actividad fsica habitual, una dieta saludable:  Promueve la prdida de [place].  Ayuda a Medical sales representative de Banker.  Ayuda a mejorar la forma en que el organismo [place] la insulina. QU DEBO SABER ACERCA DE ESTE PLAN DE ALIMENTACIN?  Use el ndice glucmico (IG) para planificar las comidas. El ndice le informa con qu rapidez un alimento elevar su nivel de azcar en la sangre. Elija los alimentos con bajo IG. Estos tardan ms  tiempo en subir el nivel de azcar en la sangre.  Preste mucha atencin a la cantidad de hidratos de carbono que hay en los alimentos que consume. Los hidratos de carbono OfficeMax Incorporated niveles de Banker.  Lleve un registro de la cantidad de caloras que ingiere. Ingerir la cantidad correcta de caloras lo ayudar a Cabin crew peso saludable. Bajar alrededor del 7por ciento del peso inicial puede ayudar a Automotive engineer la diabetes tipo2.  Tal vez deba seguir Clinical cytogeneticist. Esta incluye una gran cantidad de verduras, carnes magras o pescado, cereales integrales, frutas, as como aceites y grasas saludables.  QU ALIMENTOS PUEDO COMER? Cereales Cereales integrales, como panes, galletas, cereales y pastas de salvado o integrales. Avena sin azcar. Trigo burgol. Cebada. Quinua. Arroz integral. Tortillas o tacos de harina de maz o de salvado. Hoover Brunette Deatra James. Espinaca. Guisantes. Remolachas. Coliflor. Repollo. Brcoli. Zanahorias. Tomates. Calabaza. Christella Noa. Hierbas. Pimientos. Cebollas. Pepinos. Repollitos de Bruselas. Frutas Frutos rojos. Bananas. Manzanas. Naranjas. Uvas. Papaya. Mango. Granada. Kiwi.  Pomelo. Cerezas. Carnes y otras fuentes de protenas Mariscos. Carnes Jacob Citymagras, entre ellas, pollo y [place] Berlinpavo o cortes magros de carne de cerdo y de [place]. Tofu. Huevos. Los frutos secos. Frijoles. Lcteos Productos lcteos descremados o semidescremados, como yogur, queso cottage y Hartvillequeso. CHS IncBebidas Agua. T. Caf. Gaseosas sin azcar o dietticas. Agua de WinchesterSeltz. Leche. Productos alternativos de la [place] Islandleche, [address] Streetcomo leche de soja o de Georgetownalmendra. Condimentos Mostaza. Salsa de pepinillos. [place] con bajo contenido de [place] (the territory South of 60 deg S)grasa y de International aid/development workerazcar. Salsa barbacoa con bajo contenido de grasa y de azcar. Mayonesa sin grasa o con bajo contenido de Windhamgrasa. Dulces y postres Bud[place] sin azcar o con bajo contenido de Meekergrasa. Helados y otros dulces congelados sin azcar o con bajo contenido de Montfortgrasa. Grasas y  Arts development officeraceites [place]. Nueces. Aceite de oliva. Los artculos mencionados arriba pueden no ser Raytheonuna lista completa de las bebidas o los alimentos recomendados. Comunquese con el nutricionista para conocer ms opciones. QU ALIMENTOS NO SE RECOMIENDAN? Cereales Productos a base de [place] y de Madagascarharina blanca refinada, como panes, pastas, bocadillos y cereales. Bebidas Bebidas azucaradas, como t helado y gaseosas con International aid/development workerazcar. Dulces y [address] Productos de Palmas del [place]pan[place]era, Marbletoncomo tortas, Tidioutemadalenas, Oriole Beachpasteles, Programmer, systemsgalletitas y tarta de Carlislequeso. Los artculos mencionados arriba pueden no ser Raytheonuna lista completa de las bebidas y los alimentos que se Theatre stage managerdeben evitar. Comunquese con el nutricionista para obtener ms informacin. Esta informacin no tiene Theme park managercomo fin reemplazar el consejo del mdico. Asegrese de hacerle al mdico cualquier pregunta que tenga. Document Released: 09/08/2014 Document Revised: 09/08/2014 Document Reviewed: 01/13/2014 Elsevier Interactive Patient Education  2017 [name]    IF you received an x-ray today, you will receive an invoice from [facility]. Please contact [facility] at [phone] with questions or concerns regarding your invoice.   IF you received labwork today, you will receive an invoice from [facility]. Please contact [facility] at [phone] with questions or concerns regarding your invoice.   Our billing staff will not be able to assist you with questions regarding bills from these companies.  You will be contacted with the lab results as soon as they are available. The fastest way to get your results is to activate your My Chart account. Instructions are located on the last page of this paperwork. If you have not heard from us regarding the results in 2 weeks, please contact this office.

## 2016-09-10 ENCOUNTER — Ambulatory Visit: Payer: BLUE CROSS/BLUE SHIELD | Admitting: Physician Assistant

## 2016-09-10 DIAGNOSIS — I10 Essential (primary) hypertension: Secondary | ICD-10-CM

## 2016-09-11 LAB — COMPREHENSIVE METABOLIC PANEL
ALT: 32 IU/L (ref 0–44)
AST: 25 IU/L (ref 0–40)
Albumin/Globulin Ratio: 1.5 (ref 1.2–2.2)
Albumin: 4.6 g/dL (ref 3.5–5.5)
Alkaline Phosphatase: 59 IU/L (ref 39–117)
BUN/Creatinine Ratio: 16 (ref 9–20)
BUN: 17 mg/dL (ref 6–24)
Bilirubin Total: 0.5 mg/dL (ref 0.0–1.2)
CO2: 24 mmol/L (ref 20–29)
Calcium: 9.5 mg/dL (ref 8.7–10.2)
Chloride: 100 mmol/L (ref 96–106)
Creatinine, Ser: 1.08 mg/dL (ref 0.76–1.27)
GFR calc Af Amer: 90 mL/min/{1.73_m2} (ref >59)
GFR calc non Af Amer: 78 mL/min/{1.73_m2} (ref >59)
Globulin, Total: 3.1 g/dL (ref 1.5–4.5)
Glucose: 86 mg/dL (ref 65–99)
Potassium: 4.2 mmol/L (ref 3.5–5.2)
Sodium: 140 mmol/L (ref 134–144)
Total Protein: 7.7 g/dL (ref 6.0–8.5)

## 2017-01-20 ENCOUNTER — Other Ambulatory Visit: Payer: Self-pay

## 2017-01-20 DIAGNOSIS — Z79899 Other long term (current) drug therapy: Secondary | ICD-10-CM | POA: Diagnosis not present

## 2017-01-20 DIAGNOSIS — R109 Unspecified abdominal pain: Secondary | ICD-10-CM | POA: Diagnosis present

## 2017-01-20 DIAGNOSIS — N189 Chronic kidney disease, unspecified: Secondary | ICD-10-CM | POA: Diagnosis not present

## 2017-01-20 DIAGNOSIS — Z7984 Long term (current) use of oral hypoglycemic drugs: Secondary | ICD-10-CM | POA: Diagnosis not present

## 2017-01-20 DIAGNOSIS — I129 Hypertensive chronic kidney disease with stage 1 through stage 4 chronic kidney disease, or unspecified chronic kidney disease: Secondary | ICD-10-CM | POA: Insufficient documentation

## 2017-01-20 DIAGNOSIS — K529 Noninfective gastroenteritis and colitis, unspecified: Secondary | ICD-10-CM | POA: Diagnosis not present

## 2017-01-21 ENCOUNTER — Emergency Department (HOSPITAL_COMMUNITY): Payer: BLUE CROSS/BLUE SHIELD

## 2017-01-21 ENCOUNTER — Other Ambulatory Visit: Payer: Self-pay

## 2017-01-21 ENCOUNTER — Emergency Department (HOSPITAL_COMMUNITY)
Admission: EM | Admit: 2017-01-21 | Discharge: 2017-01-21 | Disposition: A | Discharge status: Home / Self Care | Payer: BLUE CROSS/BLUE SHIELD | Attending: Emergency Medicine | Admitting: Emergency Medicine

## 2017-01-21 ENCOUNTER — Encounter (HOSPITAL_COMMUNITY): Payer: Self-pay | Admitting: Emergency Medicine

## 2017-01-21 DIAGNOSIS — K529 Noninfective gastroenteritis and colitis, unspecified: Secondary | ICD-10-CM

## 2017-01-21 DIAGNOSIS — I10 Essential (primary) hypertension: Secondary | ICD-10-CM

## 2017-01-21 LAB — URINALYSIS, ROUTINE W REFLEX MICROSCOPIC
Bacteria, UA: NONE SEEN
Bilirubin Urine: NEGATIVE
Glucose, UA: NEGATIVE mg/dL
Ketones, ur: NEGATIVE mg/dL
Leukocytes, UA: NEGATIVE
Nitrite: NEGATIVE
Protein, ur: NEGATIVE mg/dL
Specific Gravity, Urine: 1.011 (ref 1.005–1.030)
Squamous Epithelial / LPF: NONE SEEN
pH: 6 (ref 5.0–8.0)

## 2017-01-21 LAB — CBC
HCT: 49.6 % (ref 39.0–52.0)
Hemoglobin: 17.4 g/dL — ABNORMAL HIGH (ref 13.0–17.0)
MCH: 30.7 pg (ref 26.0–34.0)
MCHC: 35.1 g/dL (ref 30.0–36.0)
MCV: 87.5 fL (ref 78.0–100.0)
Platelets: 213 10*3/uL (ref 150–400)
RBC: 5.67 MIL/uL (ref 4.22–5.81)
RDW: 13.5 % (ref 11.5–15.5)
WBC: 7.6 10*3/uL (ref 4.0–10.5)

## 2017-01-21 LAB — COMPREHENSIVE METABOLIC PANEL
ALT: 37 U/L (ref 17–63)
AST: 30 U/L (ref 15–41)
Albumin: 4.3 g/dL (ref 3.5–5.0)
Alkaline Phosphatase: 56 U/L (ref 38–126)
Anion gap: 13 (ref 5–15)
BUN: 15 mg/dL (ref 6–20)
CO2: 22 mmol/L (ref 22–32)
Calcium: 9.1 mg/dL (ref 8.9–10.3)
Chloride: 103 mmol/L (ref 101–111)
Creatinine, Ser: 0.87 mg/dL (ref 0.61–1.24)
GFR calc Af Amer: >60 mL/min (ref >60)
GFR calc non Af Amer: >60 mL/min (ref >60)
Glucose, Bld: 118 mg/dL — ABNORMAL HIGH (ref 65–99)
Potassium: 3.2 mmol/L — ABNORMAL LOW (ref 3.5–5.1)
Sodium: 138 mmol/L (ref 135–145)
Total Bilirubin: 0.8 mg/dL (ref 0.3–1.2)
Total Protein: 7.4 g/dL (ref 6.5–8.1)

## 2017-01-21 LAB — LIPASE, BLOOD: Lipase: 33 U/L (ref 11–51)

## 2017-01-21 MED ORDER — IOPAMIDOL (ISOVUE-300) INJECTION 61%
INTRAVENOUS | Status: AC
  Administered 2017-01-21: 100 mL
  Filled 2017-01-21: qty 100

## 2017-01-21 MED ORDER — SODIUM CHLORIDE 0.9 % IV BOLUS (SEPSIS)
1000.0000 mL | Freq: Once | INTRAVENOUS | Status: AC
  Administered 2017-01-21: 1000 mL via INTRAVENOUS

## 2017-01-21 MED ORDER — MORPHINE SULFATE (PF) 4 MG/ML IV SOLN
4.0000 mg | Freq: Once | INTRAVENOUS | Status: DC
  Filled 2017-01-21: qty 1

## 2017-01-21 NOTE — ED Notes (Signed)
Per patient medication not given at this time.

## 2017-01-21 NOTE — ED Provider Notes (Signed)
[facility] EMERGENCY DEPARTMENT Provider Note   CSN: [id number] Arrival date & time: 01/20/17  2356     History   Chief Complaint Chief Complaint  Patient presents with  . Hypertension  . Urinary Frequency    HPI Ethan Silva is a 52 y.o. male.  HPI Patient presents to the emergency department with high blood pressure along with some abdominal discomfort.  The patient states that he is worried about possible kidney stone.  Patient states the pain does seem to radiate to his back somewhat.  Patient states that nothing seems to make the condition better or worse he states the pain seems to get better in his abdomen over the last 2 days.  Patient had one episode of vomiting on Saturday patient has had some intermittent nausea as well.  The patient denies chest pain, shortness of breath, headache,blurred vision, neck pain, fever, cough, weakness, numbness, dizziness, anorexia, edema,diarrhea, rash, back pain, dysuria, hematemesis, bloody stool, near syncope, or syncope. Past Medical History:  Diagnosis Date  . Bile duct leak   . Chronic kidney disease 2017   left kidney stones  . Headache(784.0)    Hx: of when BP is elevated  . Hyperlipidemia   . Hypertension   . Numbness and tingling in hands    Hx: of    Patient Active Problem List   Diagnosis Date Noted  . BMI 30.0-30.9,adult 08/30/2015  . Hyperlipidemia 08/30/2015  . HTN (hypertension) 10/11/2012    Past Surgical History:  Procedure Laterality Date  . CHOLECYSTECTOMY N/A 07/11/2012   Procedure: LAPAROSCOPIC CHOLECYSTECTOMY WITH INTRAOPERATIVE CHOLANGIOGRAM;  Surgeon: Axel Filler, MD;  Location: [facility] OR;  Service: General;  Laterality: N/A;  . COLONOSCOPY    . ERCP  07/16/2012  . ERCP N/A 07/16/2012   Procedure: ENDOSCOPIC RETROGRADE CHOLANGIOPANCREATOGRAPHY (ERCP);  Surgeon: Louis Meckel, MD;  Location: [facility] OR;  Service: Gastroenterology;  Laterality: N/A;  . ERCP N/A 10/06/2012   Procedure:  ENDOSCOPIC RETROGRADE CHOLANGIOPANCREATOGRAPHY (ERCP);  Surgeon: Louis Meckel, MD;  Location: [facility];  Service: Endoscopy;  Laterality: N/A;       Home Medications    Prior to Admission medications   Medication Sig Start Date End Date Taking? Authorizing Provider  amLODipine (NORVASC) 5 MG tablet Take 1 tablet (5 mg total) by mouth daily. 09/06/16  Yes [name], PA-C  atorvastatin (LIPITOR) 20 MG tablet Take 1 tablet (20 mg total) by mouth daily. 08/08/16  Yes [name], PA-C  metFORMIN (GLUCOPHAGE) 500 MG tablet Take 1 tablet (500 mg total) by mouth daily with breakfast. 09/06/16  Yes [name], PA-C  lisinopril (PRINIVIL,ZESTRIL) 20 MG tablet Take 1 tablet (20 mg total) by mouth daily. Patient not taking: Reported on 01/21/2017 08/01/16   [name], PA-C  meclizine (ANTIVERT) 25 MG tablet Take 1 tablet (25 mg total) by mouth 3 (three) times daily as needed for dizziness. Patient not taking: Reported on 01/21/2017 08/01/16   [name], PA-C    Family History Family History  Problem Relation Age of Onset  . Gallstones Mother   . Diabetes Paternal Uncle   . Gallstones Brother     Social History Social History   Tobacco Use  . Smoking status: Never Smoker  . Smokeless tobacco: Never Used  Substance Use Topics  . Alcohol use: No  . Drug use: No     Allergies   Patient has no known allergies.   Review of Systems Review of Systems All other systems negative except  as documented in the HPI. All pertinent positives and negatives as reviewed in the HPI.  Physical Exam Updated Vital Signs BP (!) 128/95   Pulse 63   Temp 98.6 F (37 C) (Oral)   Resp 14   Ht 5\' 3"  (1.6 m)   Wt 74.8 kg (165 lb)   SpO2 94%   BMI 29.23 kg/m   Physical Exam  Constitutional: He is oriented to person, place, and time. He appears well-developed and well-nourished. No distress.  HENT:  Head: Normocephalic and atraumatic.  Mouth/Throat: Oropharynx is clear and moist.  Eyes: Pupils  are equal, round, and reactive to light.  Neck: Normal range of motion. Neck supple.  Cardiovascular: Normal rate, regular rhythm and normal heart sounds. Exam reveals no gallop and no friction rub.  No murmur heard. Pulmonary/Chest: Effort normal and breath sounds normal. No respiratory distress. He has no wheezes.  Abdominal: Soft. Bowel sounds are normal. He exhibits no distension and no mass. There is tenderness. There is no rebound and no guarding.    Neurological: He is alert and oriented to person, place, and time. He exhibits normal muscle tone. Coordination normal.  Skin: Skin is warm and dry. Capillary refill takes less than 2 seconds. No rash noted. No erythema.  Psychiatric: He has a normal mood and affect. His behavior is normal.  Nursing note and vitals reviewed.    ED Treatments / Results  Labs (all labs ordered are listed, but only abnormal results are displayed) Labs Reviewed  COMPREHENSIVE METABOLIC PANEL - Abnormal; Notable for the following components:      Result Value   Potassium 3.2 (*)    Glucose, Bld 118 (*)    All other components within normal limits  CBC - Abnormal; Notable for the following components:   Hemoglobin 17.4 (*)    All other components within normal limits  URINALYSIS, ROUTINE W REFLEX MICROSCOPIC - Abnormal; Notable for the following components:   Color, Urine STRAW (*)    Hgb urine dipstick SMALL (*)    All other components within normal limits  LIPASE, BLOOD    EKG  EKG Interpretation None       Radiology Ct Abdomen Pelvis W Contrast  Result Date: 01/21/2017 CLINICAL DATA:  Right-sided pain since Friday. EXAM: CT ABDOMEN AND PELVIS WITH CONTRAST TECHNIQUE: Multidetector CT imaging of the abdomen and pelvis was performed using the standard protocol following bolus administration of intravenous contrast. CONTRAST:  100mL ISOVUE-300 IOPAMIDOL (ISOVUE-300) INJECTION 61% COMPARISON:  None. FINDINGS: Lower chest: No acute abnormality.  Hepatobiliary: No focal liver abnormality is seen. Status post cholecystectomy. No biliary dilatation. Small amount of pneumobilia. Pancreas: Unremarkable. No pancreatic ductal dilatation or surrounding inflammatory changes. Spleen: Normal in size without focal abnormality. Adrenals/Urinary Tract: Adrenal glands are unremarkable. 1.6 x 1.3 cm hypodense, fluid attenuating left renal mass most consistent with a cyst. Multiple other smaller hypodensities in the left kidney likely reflecting smaller cysts. No urolithiasis or obstructive uropathy. Bladder is unremarkable. Stomach/Bowel: Stomach is within normal limits. Appendix appears normal. No bowel wall thickening. No pericolonic inflammatory changes. Mild small bowel distention measuring up to 2.5 cm with multiple small bowel air-fluid levels. Moderate amount of stool throughout the colon. No pneumatosis, pneumoperitoneum or portal venous gas. Vascular/Lymphatic: No significant vascular findings are present. No enlarged abdominal or pelvic lymph nodes. Reproductive: Prostate is unremarkable. Other: No abdominal wall hernia or abnormality. No abdominopelvic ascites. Musculoskeletal: No acute osseous abnormality. No aggressive osseous lesion. Bilateral facet arthropathy at L4-5 and  L5-S1. IMPRESSION: 1. Mild small bowel distention measuring up to 2.5 cm with multiple small bowel air-fluid levels. Differential considerations include early partial small bowel obstruction versus mild enteritis or ileus. Electronically Signed   By: Elige Ko   On: 01/21/2017 08:32    Procedures Procedures (including critical care time)  Medications Ordered in ED Medications  sodium chloride 0.9 % bolus 1,000 mL (0 mLs Intravenous Stopped 01/21/17 0930)  iopamidol (ISOVUE-300) 61 % injection (100 mLs  Contrast Given 01/21/17 0758)     Initial Impression / Assessment and Plan / ED Course  I have reviewed the triage vital signs and the nursing notes.  Pertinent labs &  imaging results that were available during my care of the patient were reviewed by me and considered in my medical decision making (see chart for details).  Clinical Course as of Jan 21 1554  Mon Jan 21, 2017  8119 CT Renal Soundra [place]ilon [FM]  1478 CT Abdomen Pelvis W Contrast [FM]    Clinical Course User Index [FM] Cherylann Ratel, Student-PA   Patient most likely does not have bowel obstruction is a he has good bowel sounds along with stool passing stool and flatus.  The patient will be referred to a primary doctor.  He does see a clinic but will need more consistent care.  I have advised the patient to return here for any worsening in his condition.  I did advise that this abdominal issue could worsen the patient voiced an understanding and agrees to the plan.  Final Clinical Impressions(s) / ED Diagnoses   Final diagnoses:  Enteritis  Essential hypertension    ED Discharge Orders    None       Charlestine Night, PA-C 01/21/17 1615    Mesner, Barbara Cower, MD 01/21/17 1755

## 2017-01-21 NOTE — Discharge Planning (Signed)
EDCM consulted to assist pt with PCP establishment.  Pt has [employer] insurance; NCM provided instructions on calling number on back of card to obtain list of PCPs in area accepting his insurance along with a list of some known physician's offices accepting new pts.  No further needs communicated at this time.

## 2017-01-21 NOTE — ED Notes (Signed)
Patient transported to CT 

## 2017-01-21 NOTE — ED Triage Notes (Signed)
Pt c/o high blood pressure. States he has been taking medications as directed. Denies chest pain, c/o intermittent headache. Pt also states he has right lower abdominal pain and burning with urination.

## 2017-01-21 NOTE — Discharge Instructions (Signed)
Return here as needed. Slowly increase your fluid intake. Follow up by calling for an appointment with a primary doctor.

## 2017-01-21 NOTE — ED Notes (Signed)
Pt complains of high blood pressure and possible kidney stones. Pt states he takes his medicine but it is not working. Pt reports a blood pressure of 200/100 at home. Pt also reports a history of kidney stones and has the same burning feeling with back pain. Pt also endorses a headache.

## 2017-01-23 ENCOUNTER — Ambulatory Visit: Payer: BLUE CROSS/BLUE SHIELD | Admitting: Physician Assistant

## 2017-01-23 ENCOUNTER — Encounter: Payer: Self-pay | Admitting: Physician Assistant

## 2017-01-23 VITALS — BP 149/90 | HR 70 | Temp 98.1°F | Resp 16 | Ht 63.0 in | Wt 156.4 lb

## 2017-01-23 DIAGNOSIS — I10 Essential (primary) hypertension: Secondary | ICD-10-CM | POA: Diagnosis not present

## 2017-01-23 DIAGNOSIS — F411 Generalized anxiety disorder: Secondary | ICD-10-CM

## 2017-01-23 MED ORDER — LISINOPRIL 40 MG PO TABS: 40.0000 mg | ORAL_TABLET | Freq: Every day | ORAL | 0 refills | Status: DC

## 2017-01-23 MED ORDER — HYDROXYZINE PAMOATE 50 MG PO CAPS: 50.0000 mg | ORAL_CAPSULE | Freq: Every day | ORAL | 0 refills | Status: DC

## 2017-01-23 NOTE — Progress Notes (Signed)
Ethan Silva  MRN: [id number] DOB: 18-Jan-1965  PCP: Patient, No Pcp Per  Subjective:  Pt is a 52 year old male who presents to clinic for blood pressure check.  HTN - last OV for this problem 08/2016, seen by PA Gurney MaxinMike Mani. At that OV add Amlodipine 5mg , con't Lisinopril 20mg . Today's blood pressure is 149/90. He is not taking Amlodipine as he feels this caused dizziness.  Home blood pressure 144-168.   C/o tremor at night before bed x several years. Tremor improves with getting under covers for warms. He admits to thinking about things at night which cause anxiety. Tremor seems to happen during this time. He has not been treated for this before.   Review of Systems  Constitutional: Negative for chills and diaphoresis.  Respiratory: Negative for cough, chest tightness, shortness of breath and wheezing.   Cardiovascular: Negative for chest pain, palpitations and leg swelling.  Gastrointestinal: Negative for diarrhea, nausea and vomiting.  Musculoskeletal: Negative for neck pain.  Neurological: Positive for tremors (at night). Negative for dizziness, syncope, light-headedness and headaches.  Psychiatric/Behavioral: Negative for sleep disturbance. The patient is not nervous/anxious.     Patient Active Problem List   Diagnosis Date Noted  . BMI 30.0-30.9,adult 08/30/2015  . Hyperlipidemia 08/30/2015  . HTN (hypertension) 10/11/2012    Current Outpatient Medications on File Prior to Visit  Medication Sig Dispense Refill  . amLODipine (NORVASC) 5 MG tablet Take 1 tablet (5 mg total) by mouth daily. 90 tablet 1  . atorvastatin (LIPITOR) 20 MG tablet Take 1 tablet (20 mg total) by mouth daily. 90 tablet 1  . metFORMIN (GLUCOPHAGE) 500 MG tablet Take 1 tablet (500 mg total) by mouth daily with breakfast. 90 tablet 1  . lisinopril (PRINIVIL,ZESTRIL) 20 MG tablet Take 1 tablet (20 mg total) by mouth daily. (Patient not taking: Reported on 01/21/2017) 90 tablet 1   No current  facility-administered medications on file prior to visit.     No Known Allergies   Objective:  BP (!) 149/90   Pulse 70   Temp 98.1 F (36.7 C) (Oral)   Resp 16   Ht 5\' 3"  (1.6 m)   Wt 156 lb 6.4 oz (70.9 kg)   SpO2 96%   BMI 27.71 kg/m   Physical Exam  Constitutional: He is oriented to person, place, and time and well-developed, well-nourished, and in no distress. No distress.  Cardiovascular: Normal rate, regular rhythm and normal heart sounds.  Neurological: He is alert and oriented to person, place, and time. He has normal motor skills. He displays no tremor. GCS score is 15.  Skin: Skin is warm and dry.  Psychiatric: Mood, memory, affect and judgment normal.  Vitals reviewed.   Assessment and Plan :  1. Essential hypertension - lisinopril (PRINIVIL,ZESTRIL) 40 MG tablet; Take 1 tablet (40 mg total) by mouth daily.  Dispense: 60 tablet; Refill: 0 - Pt presents for blood pressure check. Uncontrolled with Lisinopril 20mg . Today's blood pressure is 149/90. Home blood pressures are also elevated. He is not taking Amlodipine due to fear of side effect of dizziness. Plan to increase Lisinopril. RTC in 4 weeks for recheck. Advised DASH diet.  2. Anxiety state - hydrOXYzine (VISTARIL) 50 MG capsule; Take 1 capsule (50 mg total) by mouth at bedtime.  Dispense: 60 capsule; Refill: 0 - Pt c/o tremor at night. Suspect this is related to anxiety. Will try Hydroxyzine.   Marco CollieWhitney Elayne Gruver, PA-C  [facility] 01/23/2017  1:52 PM

## 2017-01-23 NOTE — Patient Instructions (Addendum)
Hydroxyzine es para ayudar con su temblor en la noche. Tomar 50 mg 1 hora antes de acostarse. Si esto no le ayuda, puede aumentar la dosis a 100 mg despus de 7 das.  Vuelva y vame para una revisin de la presin arterial en 4-6 semanas. ----------------------------------------------------------------------------------------------------- Hydroxyzine is to help with your shaking at night. Take 53m 1 hour before bedtime. If this is not helping, you may increase the dose to 1066mafter 7 days.   Come back and see me for blood pressure recheck in 4-6 weeks.  Thank you for coming in today. I hope you feel we met your needs.  Feel free to call PCP if you have any questions or further requests.  Please consider signing up for MyChart if you do not already have it, as this is a great way to communicate with me.  Best,  Whitney McVey, PA-C   Plan de alimentacin DASH DASH Eating Plan DASH es la sigla en ingls de "Enfoques Alimentarios para Detener la Hipertensin" (Dietary Approaches to Stop Hypertension). El plan de alimentacin DASH ha demostrado bajar la presin arterial elevada (hipertensin). Tambin puede reducir elUnitedHealthe diabetes tipo 2, enfermedad cardaca y accidente cerebrovascular. Este plan tambin puede ayudar a adHorticulturist, commercialConsejos para seguir este plan Pautas generales  Evite ingerir ms de 2,300 mg (miligramos) de sal (sodio) por da. Si tiene hipertensin, es posible que necesite reducir la ingesta de sodio a 1,500 mg por da.  Limite el consumo de alcohol a no ms de 82m96mda por da si es mujer y no est embEnemy Swim 2me482mas por da si es hombre. Una medida equivale a 12oz (355ml882m cerveza, 5oz (148ml)77mvino o 1oz (44ml) 70mebidas alcohlicas de alta graduacin.  Trabaje con su mdico para mantener un peso saludable o perder peso. PLiberty Mediatele cul es el peso recomendado para usted.  Realice al menos 30 minutos de ejercicio que haga que se acelere su corazn  (ejercicio aerbicoArboriculturistyora Hartford Financialsemana.Rimini actividades pueden incluir caminar, nadar o andar en bicicleta.  Trabaje con su mdico o especialista en alimentacin y nutricin (nutricionista) para ajustar su plan alimentario a sus necesidades calricas personales. Lectura de las etiquetas de los alimentos  Verifique en las etiquetas de los alimentos, la cantidad de sodio por porcin. Elija alimentos con menos del 5 por ciento del valor diario de sodio. Generalmente, los alimentos con menos de 300 mg de sodio por porcin se encuadran dentro de este plan alimentario.  Para encontrar cereales integrales, busque la palabra "integral" como primera palabra en la lista de ingredientes. De compras  Compre productos en los que en su etiqueta diga: "bajo contenido de sodio" o "sin agregado de sal".  Compre alimentos frescos. Evite los alimentos enlatados y comidas precocidas o congeladas. Coccin  Evite agregar sal cuando cocine. Use hierbas o aderezos sin sal, en lugar de sal de mesa o sal marina. Consulte al mdico o farmacutico antes de usar sustitutos de la sal.  No fra los alimentos. A la hora de cocinar los alimentos opte por hornearlos, hervirlos, grillarlos y asarlos a la parrAdministrator, artsne con aceites cardiosaludables, como oliva, canola, soja o girasol. Planificacin de las comidas   Consuma una dieta equilibrada, que incluya lo siguiente: ? 5o ms porciones de frutas y verduraSet designer de que la mitad del plato de cada comida sean frutas y verduras. ? Hasta 6 u 8 porciones de cereales integrales por da. ? Menos de  6 onzas de carne, aves o pescado Games developer. Una porcin de 3 onzas de carne tiene casi el mismo tamao que un mazo de cartas. Un huevo equivale a 1 onza. ? Dos porciones de productos lcteos descremados por Training and development officer. ? Una porcin de frutos secos, semillas o frijoles 5 veces por semana. ? Grasas cardiosaludables. Las grasas saludables llamadas  cidos grasos omega-3 se encuentran en alimentos como semillas de lino y pescados de agua fra, como por ejemplo, sardinas, salmn y caballa.  Limite la cantidad que ingiere de los siguientes alimentos: ? Alimentos enlatados o envasados. ? Alimentos con alto contenido de grasa trans, como alimentos fritos. ? Alimentos con alto contenido de grasa saturada, como carne con grasa. ? Dulces, postres, bebidas azucaradas y otros alimentos con azcar agregada. ? Productos lcteos enteros.  No le agregue sal a los alimentos antes de probarlos.  Trate de comer al menos 2 comidas vegetarianas por semana.  Consuma ms comida casera y menos de restaurante, de bufs y comida rpida.  Cuando coma en un restaurante, pida que preparen su comida con menos sal o, en lo posible, sin nada de sal. Qu alimentos se recomiendan? Los alimentos enumerados a continuacin no constituyen Furniture conservator/restorer. Hable con el nutricionista sobre las mejores opciones alimenticias para usted. Cereales Pan de salvado o integral. Pasta de salvado o integral. Arroz integral. Avena. Quinua. Trigo burgol. Cereales integrales y con bajo contenido de sodio. Pan pita. Galletitas de [place] con bajo contenido de [place] y [place]. Tortillas de [place] integral. Verduras Verduras frescas o congeladas (crudas, al vapor, asadas o grilladas). Jugos de tomate y verduras con bajo contenido de sodio o reducidos en sodio. Salsa y pasta de tomate con bajo contenido de sodio o reducidas en sodio. Verduras enlatadas con bajo contenido de sodio o reducidas en sodio. Frutas Todas las frutas frescas, congeladas o disecadas. Frutas enlatadas en jugo natural (sin agregado de azcar). Carne y otros alimentos proteicos Pollo o pavo sin piel. Carne de pollo o de [place]. Cerdo desgrasado. Pescado y [employer]. Claras de huevo. Porotos, guisantes o lentejas secos. Frutos secos, mantequilla de frutos secos y semillas sin sal. Frijoles enlatados sin sal. Cortes  de carne vacuna magra, desgrasada. Embutidos magros, con bajo contenido de Sophia. Lcteos Leche descremada (1%) o descremada. Quesos sin grasa, con bajo contenido de grasa o descremados. Queso blanco o ricota sin grasa, con bajo contenido de [place]. Yogur semidescremado o descremado. Queso con bajo contenido de [place] y [place]. Grasas y [employer] untables que no contengan grasas trans. Aceite vegetal. Lubertha Basque y aderezos para ensaladas livianos o con bajo contenido de grasas (reducidos en sodio). Aceite de canola, crtamo, oliva, soja y [place]. [place]. Condimentos y otros alimentos Hierbas. Especias. Mezclas de condimentos sin sal. Palomitas de maz y pretzels sin sal. Dulces con bajo contenido de grasas. Qu alimentos no se recomiendan? Los alimentos enumerados a continuacin no constituyen Furniture conservator/restorer. Hable con el nutricionista sobre las mejores opciones alimenticias para usted. Cereales Productos de panificacin hechos con grasa, como medialunas, magdalenas y algunos panes. Comidas con arroz o pasta seca listas para usar. Verduras Verduras con crema o fritas. Verduras en [place]. Verduras enlatadas regulares (que no sean con bajo contenido de sodio o reducidas en sodio). Pasta y salsa de tomates enlatadas regulares (que no sean con bajo contenido de sodio o reducidas en sodio). Jugos de tomate y verduras regulares (que no sean con bajo contenido de sodio o reducidos en sodio). Pepinillos. Aceitunas.  Lambert Mody Fruta enlatada en almbar liviano o espeso. Frutas cocidas en aceite. Frutas con salsa de crema o [place]. Carne y otros alimentos proteicos Cortes de carne con grasa. Costillas. Carne frita. Tocino. Salchichas. Mortadela y otras carnes procesadas. Salame. Panceta. Perros calientes (hotdogs). [place]. Frutos secos y semillas con sal. Frijoles enlatados con agregado de sal. Pescado enlatado o ahumado. Huevos enteros o yemas. Pollo o pavo con  piel. Lcteos Leche entera o al 2%, crema y mitad leche y mitad crema. Queso crema entero o con toda su grasa. Yogur entero o endulzado. Quesos con toda su grasa. Sustitutos de cremas no lcteas. Coberturas batidas. Quesos para untar y quesos procesados. Grasas y [employer]. Margarina en barra. Santa Claus. Materia grasa. Mantequilla clarificada. Grasa de panceta. Aceites tropicales como aceite de coco, palmiste o palma. Condimentos y otros alimentos Palomitas de maz y pretzels con sal. Sal de cebolla, sal de ajo, sal condimentada, sal de mesa y sal marina. Salsa [place]. Salsa trtara. Salsa barbacoa. Salsa teriyaki. Salsa de soja, incluso la que tiene contenido reducido de [place]. Salsa de carne. Salsas en lata y envasadas. Salsa de pescado. Salsa de [place]. Salsa rosada. Rbano picante envasado. [place]. Mostaza. Saborizantes y tiernizantes para carne. Caldo en cubitos. Salsa picante y salsa tabasco. Escabeches envasados o ya preparados. Aderezos para tacos prefabricados o envasados. Salsas. Aderezos comunes para ensalada. Dnde encontrar ms informacin:  [place], los Pulmones y Herbalist (National Heart, Lung, and [place]): https://wilson-eaton.com/  Asociacin Estadounidense del Corazn (American Heart Association): www.heart.org Resumen  El plan de alimentacin DASH ha demostrado bajar la presin arterial elevada (hipertensin). Tambin puede reducir UnitedHealth de diabetes tipo 2, enfermedad cardaca y accidente cerebrovascular.  Con el plan de alimentacin DASH, deber limitar el consumo de sal (sodio) a 2,300 mg por da. Si tiene hipertensin, es posible que necesite reducir la ingesta de sodio a 1,500 mg por da.  Cuando siga el plan de alimentacin DASH, trate de comer ms frutas frescas y verduras, cereales integrales, carnes magras, lcteos descremados y grasas cardiosaludables.  Trabaje con su mdico o especialista en alimentacin y  nutricin (nutricionista) para ajustar su plan alimentario a sus necesidades calricas personales. Esta informacin no tiene Marine scientist el consejo del mdico. Asegrese de hacerle al mdico cualquier pregunta que tenga. Document Released: 12/07/2010 Document Revised: 04/09/2016 Document Reviewed: 04/09/2016 Elsevier Interactive Patient Education  2018 [employer].  IF you received an x-ray today, you will receive an invoice from [facility]. Please contact [facility] at [phone] with questions or concerns regarding your invoice.   IF you received labwork today, you will receive an invoice from [place]. Please contact [facility] at [phone] with questions or concerns regarding your invoice.   Our billing staff will not be able to assist you with questions regarding bills from these companies.  You will be contacted with the lab results as soon as they are available. The fastest way to get your results is to activate your My Chart account. Instructions are located on the last page of this paperwork. If you have not heard from [place] regarding the results in 2 weeks, please contact this office.

## 2017-02-19 ENCOUNTER — Encounter: Payer: Self-pay | Admitting: Urgent Care

## 2017-02-19 ENCOUNTER — Ambulatory Visit: Payer: BLUE CROSS/BLUE SHIELD | Admitting: Urgent Care

## 2017-02-19 VITALS — BP 150/91 | HR 71 | Temp 98.5°F | Resp 18 | Ht 63.0 in | Wt 157.2 lb

## 2017-02-19 DIAGNOSIS — I10 Essential (primary) hypertension: Secondary | ICD-10-CM

## 2017-02-19 DIAGNOSIS — R195 Other fecal abnormalities: Secondary | ICD-10-CM

## 2017-02-19 DIAGNOSIS — R1010 Upper abdominal pain, unspecified: Secondary | ICD-10-CM

## 2017-02-19 DIAGNOSIS — E782 Mixed hyperlipidemia: Secondary | ICD-10-CM

## 2017-02-19 DIAGNOSIS — H8110 Benign paroxysmal vertigo, unspecified ear: Secondary | ICD-10-CM

## 2017-02-19 DIAGNOSIS — R030 Elevated blood-pressure reading, without diagnosis of hypertension: Secondary | ICD-10-CM | POA: Diagnosis not present

## 2017-02-19 DIAGNOSIS — R1013 Epigastric pain: Secondary | ICD-10-CM | POA: Diagnosis not present

## 2017-02-19 LAB — CBC
Hematocrit: 48.7 % (ref 37.5–51.0)
Hemoglobin: 16.7 g/dL (ref 13.0–17.7)
MCH: 29.7 pg (ref 26.6–33.0)
MCHC: 34.3 g/dL (ref 31.5–35.7)
MCV: 87 fL (ref 79–97)
Platelets: 225 10*3/uL (ref 150–379)
RBC: 5.62 x10E6/uL (ref 4.14–5.80)
RDW: 13.8 % (ref 12.3–15.4)
WBC: 6.8 10*3/uL (ref 3.4–10.8)

## 2017-02-19 LAB — COMPREHENSIVE METABOLIC PANEL
ALT: 44 IU/L (ref 0–44)
AST: 30 IU/L (ref 0–40)
Albumin/Globulin Ratio: 1.7 (ref 1.2–2.2)
Albumin: 4.7 g/dL (ref 3.5–5.5)
Alkaline Phosphatase: 52 IU/L (ref 39–117)
BUN/Creatinine Ratio: 20 (ref 9–20)
BUN: 18 mg/dL (ref 6–24)
Bilirubin Total: 0.5 mg/dL (ref 0.0–1.2)
CO2: 21 mmol/L (ref 20–29)
Calcium: 9.3 mg/dL (ref 8.7–10.2)
Chloride: 102 mmol/L (ref 96–106)
Creatinine, Ser: 0.92 mg/dL (ref 0.76–1.27)
GFR calc Af Amer: 109 mL/min/{1.73_m2} (ref >59)
GFR calc non Af Amer: 95 mL/min/{1.73_m2} (ref >59)
Globulin, Total: 2.7 g/dL (ref 1.5–4.5)
Glucose: 97 mg/dL (ref 65–99)
Potassium: 4.2 mmol/L (ref 3.5–5.2)
Sodium: 139 mmol/L (ref 134–144)
Total Protein: 7.4 g/dL (ref 6.0–8.5)

## 2017-02-19 LAB — LIPID PANEL
Chol/HDL Ratio: 3.8 ratio (ref 0.0–5.0)
Cholesterol, Total: 184 mg/dL (ref 100–199)
HDL: 48 mg/dL (ref >39)
LDL Calculated: 111 mg/dL — ABNORMAL HIGH (ref 0–99)
Triglycerides: 126 mg/dL (ref 0–149)
VLDL Cholesterol Cal: 25 mg/dL (ref 5–40)

## 2017-02-19 MED ORDER — LISINOPRIL 40 MG PO TABS: 40.0000 mg | ORAL_TABLET | Freq: Every day | ORAL | 1 refills | Status: DC

## 2017-02-19 MED ORDER — MECLIZINE HCL 25 MG PO TABS: 25.0000 mg | ORAL_TABLET | Freq: Three times a day (TID) | ORAL | 5 refills | Status: DC | PRN

## 2017-02-19 MED ORDER — ATORVASTATIN CALCIUM 20 MG PO TABS: 20.0000 mg | ORAL_TABLET | Freq: Every day | ORAL | 1 refills | Status: DC

## 2017-02-19 MED ORDER — OMEPRAZOLE 20 MG PO CPDR: 20.0000 mg | DELAYED_RELEASE_CAPSULE | Freq: Every day | ORAL | 3 refills | Status: DC

## 2017-02-19 NOTE — Patient Instructions (Addendum)
Dolor abdominal en los adultos Abdominal Pain, Adult El dolor de Ethan Silva (abdominal) puede tener muchas causas. La mayora de las veces, el dolor de Ethan Silva no es peligroso. Muchos de Ethan Silva casos de dolor de estmago pueden controlarse y tratarse en casa. Sin embargo, a Ethan Silva, Ethan Silva dolor de Ethan Silva puede ser grave. El mdico intentar descubrir la causa del dolor de Ethan Silva. Siga estas indicaciones en su casa:  Tome los medicamentos de venta libre y los recetados solamente como se lo haya indicado el mdico. No tome medicamentos que lo ayuden a Advertising copywriterdefecar (laxantes), salvo que el mdico se lo indique.  Beba suficiente lquido para mantener el pis (orina) claro o de color amarillo plido.  Est atento al dolor de estmago para Insurance risk surveyordetectar cualquier cambio.  Concurra a todas las visitas de control como se lo haya indicado el mdico. Esto es importante. Comunquese con un mdico si:  El dolor de Ethan Silva cambia o Ethan Silva.  No tiene apetito o baja de peso sin proponrselo.  Tiene dificultades para defecar (est estreido) o heces lquidas (diarrea) durante ms de 2 o 3das.  Siente dolor al orinar o defecar.  El dolor de estmago lo despierta de noche.  El dolor empeora con las comidas, despus de comer o con determinados alimentos.  Vomita y no puede retener nada de lo que ingiere.  Tiene fiebre. Solicite ayuda de inmediato si:  El dolor no desaparece en el tiempo indicado por el mdico.  No puede detener los vmitos.  Siente dolor solamente en zonas especficas del abdomen, como el lado derecho o la parte inferior izquierda.  Tiene heces con sangre, de color negro o con aspecto alquitranado.  Tiene dolor muy intenso en el vientre, clicos o meteorismo.  Presenta signos de no tener suficientes lquidos o agua en el cuerpo (deshidratacin), por ejemplo: ? La Comorosorina es Ethan Silva, es muy escasa o no orina. ? [name]. ? [name]. ? Ojos  hundidos. ? Somnolencia. ? Debilidad. Esta informacin no tiene Theme park managercomo fin reemplazar el consejo del mdico. Asegrese de hacerle al mdico cualquier pregunta que tenga. Document Released: 03/16/2008 Document Revised: 03/14/2016 Document Reviewed: 06/01/2015 Elsevier Interactive Patient Education  2018 Ethan Silva.     Opciones de alimentos para pacientes adultos con enfermedad de reflujo gastroesofgico Food Choices for Gastroesophageal Reflux Disease, Adult Si tiene enfermedad de reflujo gastroesofgico (ERGE), los alimentos que consume y los hbitos de alimentacin son Engineer, productionmuy importantes. Elegir los alimentos adecuados puede ayudar a Paramedicaliviar las molestias ocasionadas por la Hartford FinancialERGE. Considere la posibilidad de trabajar con un especialista en dieta y nutricin (nutricionista) que lo ayude a Software engineerhacer elecciones saludables. Qu pautas generales debo seguir? Plan de alimentacin  Elija alimentos saludables con bajo contenido de grasa, como frutas, verduras, cereales integrales, productos lcteos descremados, carne magra de vaca, de pescado y de ave.  Haga comidas pequeas con frecuencia en lugar de tres comidas abundantes al da. Coma lentamente, en un ambiente distendido. Evite agacharse o recostarse hasta despus de 2 o 3horas de haber comido.  Limite los alimentos con alto contenido graso como las carnes grasas o los alimentos fritos.  Limite el consumo de St. Charlesaceites, Vernoniamanteca y Indiamargarina a menos de 8 cucharaditas al Futures traderda.  Evite lo siguiente: ? Consumir alimentos que le ocasionen sntomas. Pueden ser distintos para cada persona. Lleve un registro de los alimentos para identificar aquellos que le ocasionen sntomas. ? Consumir alcohol. ? Beber grandes cantidades de lquido con las comidas. ? Comer 2 o 3 horas antes de acostarse.  Cocine los alimentos utilizando mtodos que no sean la fritura. Esto puede incluir hornear, emparrillar y hervir. Estilo de vida   Mantenga un peso saludable.  Pregunte a su mdico cul es el peso saludable para usted. Si debe perder peso, hable con su mdico para hacerlo de manera segura.  Realice actividad fsica durante, al menos, 30 minutos 5 das por semana o ms, o segn lo indicado por su mdico.  Evite usar ropa ajustada alrededor de la cintura y el pecho.  No consuma ningn producto que contenga nicotina o tabaco, como cigarrillos y cigarrillos electrnicos. Si necesita ayuda para dejar de fumar, consulte al mdico.  Duerma con la cabecera de la cama elevada. Use una cua debajo del colchn o bloques debajo del armazn de la cama para mantener la cabecera de la cama elevada. Qu alimentos no se recomiendan? Esta podra no ser una lista completa. Hable con el nutricionista sobre las mejores opciones alimenticias para usted. Carbohidratos Pasteles o panes sin levadura con grasa agregada. Tostadas francesas. Verduras Verduras fritas en abundante aceite. Papas fritas. Cualquier verdura que est preparada con grasa agregada. Cualquier verdura que le ocasione sntomas. Para algunas personas, estas pueden incluir tomates y productos con tomate, ajes, cebollas y ajo, y rbanos picantes. Frutas Cualquier fruta que est preparada con grasa agregada. Cualquier fruta que le ocasione sntomas. Para algunas personas, estas pueden incluir, las frutas ctricas como naranja, pomelo, pia y limn. Carnes y otros alimentos ricos en protenas Carnes de alto contenido graso como carne grasa de vaca o cerdo, salchichas, costillas, jamn, salchicha, salame y tocino. Carnes o protenas fritas, lo que incluye pescado frito y pollo frito. Nueces y mantequillas de frutos secos. Lcteos Leche entera y leche con chocolate. Crema cida. Crema. Helados. Queso crema. Batidos con leche. Bebidas Caf y t negro, con o sin cafena. Bebidas carbonatadas. Refrescos. Bebidas energizantes. Jugo de fruta hecho con frutas cidas (como naranja o pomelo). Jugo de tomate. Bebidas  alcohlicas. Grasas y aceites Mantequilla. Margarina. Lardo. Mantequilla clarificada. Dulces y postres Chocolate y cacao. Rosquillas. Condimentos y otros alimentos Pimienta. Menta y mentol. Cualquier condimento, hierbas o aderezos que le ocasionen sntomas. Para algunas personas, esto puede incluir curry, salsa picante o aderezos para ensalada a base de vinagre. Resumen  Si tiene enfermedad de reflujo gastroesofgico (ERGE), las elecciones de alimentos y estilo de vida son muy importantes para ayudar a aliviar las molestias de la ERGE.  Haga comidas pequeas con frecuencia en lugar de tres comidas abundantes al da. Coma lentamente, en un ambiente distendido. Evite agacharse o recostarse hasta despus de 2 o 3horas de haber comido.  Limite los alimentos con alto contenido graso como la carne grasa o los alimentos fritos. Esta informacin no tiene como fin reemplazar el consejo del mdico. Asegrese de hacerle al mdico cualquier pregunta que tenga. Document Released: 09/27/2004 Document Revised: 04/09/2016 Document Reviewed: 10/22/2012 Elsevier Interactive Patient Education  2018 Elsevier Silva.     IF you received an x-ray today, you will receive an invoice from [facility]. Please contact [facility] at [phone] with questions or concerns regarding your invoice.   IF you received labwork today, you will receive an invoice from [facility]. Please contact [facility] at [phone] with questions or concerns regarding your invoice.   Our billing staff will not be able to assist you with questions regarding bills from these companies.  You will be contacted with the lab results as soon as they are available. The fastest way to get your results is to   to activate your My Chart account. Instructions are located on the last page of this paperwork. If you have not heard from us regarding the results in 2 weeks, please contact this office.      

## 2017-02-19 NOTE — Progress Notes (Signed)
  MRN: [id number] DOB: Jul 24, 1965  Subjective:   Ethan Silva is a 52 y.o. male presenting for 1 week history of upper abdominal pain. Pain is intermittent, can be worse with food. Associated with subjective fever, sweats, burping. He is having bowel movements but has a longstanding history of intermittent loose stools. Has a history of cholecystectomy. Has seen a GI doctor, had a colonoscopy done, was supposed to get an endoscopy but did not follow up for this. Would like a referral to get this done. He is requesting refills for his Lipitor as well, he is fasting today. Also would like a refill of meclizine which he uses for intermittent vertigo, 2 episodes in the past month.   Ethan Silva has a current medication list which includes the following prescription(s): atorvastatin, hydroxyzine, lisinopril, amlodipine, and metformin. Also has No Known Allergies.  Ethan Silva  has a past medical history of Bile duct leak, Chronic kidney disease (2017), Headache(784.0), Hyperlipidemia, Hypertension, and Numbness and tingling in hands. Also  has a past surgical history that includes Cholecystectomy (N/A, 07/11/2012); ERCP (07/16/2012); ERCP (N/A, 07/16/2012); ERCP (N/A, 10/06/2012); and Colonoscopy.  Objective:   Vitals: BP (!) 150/91   Pulse 71   Temp 98.5 F (36.9 C) (Oral)   Resp 18   Ht 5\' 3"  (1.6 m)   Wt 157 lb 3.2 oz (71.3 kg)   SpO2 96%   BMI 27.85 kg/m   BP Readings from Last 3 Encounters:  02/19/17 (!) 150/91  01/23/17 (!) 149/90  01/21/17 (!) 128/95    The 10-year ASCVD risk score Denman George(Goff DC Jr., et al., 2013) is: 13.7%   Values used to calculate the score:     Age: [age] years     Sex: Male     Is Non-Hispanic African American: No     Diabetic: No     Tobacco smoker: No     Systolic Blood Pressure: 150 mmHg     Is BP treated: Yes     HDL Cholesterol: 37 mg/dL     Total Cholesterol: 273 mg/dL  Physical Exam  Constitutional: He is oriented to person, place, and time. He appears well-developed  and well-nourished.  HENT:  Mouth/Throat: Oropharynx is clear and moist.  Cardiovascular: Normal rate, regular rhythm and intact distal pulses. Exam reveals no gallop and no friction rub.  No murmur heard. Pulmonary/Chest: No respiratory distress. He has no wheezes. He has no rales.  Abdominal: Soft. Bowel sounds are normal. He exhibits no distension and no mass. There is tenderness in the epigastric area. There is no guarding.  Neurological: He is alert and oriented to person, place, and time.  Skin: Skin is warm and dry.   Assessment and Plan :   Abdominal pain, epigastric - Plan: H. pylori breath test, Comprehensive metabolic panel, CBC, Ambulatory referral to Gastroenterology  Upper abdominal pain - Plan: H. pylori breath test, Comprehensive metabolic panel, CBC, Ambulatory referral to Gastroenterology  Essential hypertension - Plan: lisinopril (PRINIVIL,ZESTRIL) 40 MG tablet  Elevated blood pressure reading  Mixed hyperlipidemia - Plan: Lipid panel  Loose stools - Plan: Ambulatory referral to Gastroenterology  Benign paroxysmal positional vertigo, unspecified laterality  Will have patient start Prilosec, labs pending. Recommended dietary modifications. Referral to GI pending. Refilled Lipitor. Refilled Lisinopril, recommended patient restart amlodipine at 5mg . Recheck HTN in 4 weeks. Return-to-clinic precautions discussed, patient verbalized understanding. Use meclizine as needed for vertigo.  Wallis BambergMario Ladan Vanderzanden, PA-C [facility] at [facility] [phone] 02/19/2017  11:03 AM

## 2017-02-20 LAB — H. PYLORI BREATH TEST: H pylori Breath Test: POSITIVE — AB

## 2017-02-21 ENCOUNTER — Telehealth: Payer: Self-pay | Admitting: Urgent Care

## 2017-02-21 ENCOUNTER — Other Ambulatory Visit: Payer: Self-pay | Admitting: Urgent Care

## 2017-02-21 MED ORDER — CLARITHROMYCIN 500 MG PO TABS: 500.0000 mg | ORAL_TABLET | Freq: Two times a day (BID) | ORAL | 0 refills | Status: DC

## 2017-02-21 MED ORDER — AMOXICILLIN 500 MG PO CAPS: 1000.0000 mg | ORAL_CAPSULE | Freq: Two times a day (BID) | ORAL | 0 refills | Status: DC

## 2017-02-21 MED ORDER — OMEPRAZOLE 20 MG PO CPDR: 20.0000 mg | DELAYED_RELEASE_CAPSULE | Freq: Two times a day (BID) | ORAL | 1 refills | Status: DC

## 2017-02-21 NOTE — Telephone Encounter (Signed)
Pt came in stating he was seen on 02/19/17 for stomach pain and he is now having pain in his left side that also hurts when he bends. Pt is requesting something to help for this. Pt can be reached at [phone].

## 2017-02-22 ENCOUNTER — Encounter: Payer: Self-pay | Admitting: Urgent Care

## 2017-02-22 ENCOUNTER — Ambulatory Visit: Payer: BLUE CROSS/BLUE SHIELD | Admitting: Urgent Care

## 2017-02-22 VITALS — BP 126/80 | HR 61 | Temp 98.4°F | Resp 17 | Ht 63.0 in | Wt 157.0 lb

## 2017-02-22 DIAGNOSIS — Z9049 Acquired absence of other specified parts of digestive tract: Secondary | ICD-10-CM

## 2017-02-22 DIAGNOSIS — R1013 Epigastric pain: Secondary | ICD-10-CM | POA: Diagnosis not present

## 2017-02-22 DIAGNOSIS — R1084 Generalized abdominal pain: Secondary | ICD-10-CM | POA: Diagnosis not present

## 2017-02-22 DIAGNOSIS — K439 Ventral hernia without obstruction or gangrene: Secondary | ICD-10-CM | POA: Diagnosis not present

## 2017-02-22 DIAGNOSIS — A048 Other specified bacterial intestinal infections: Secondary | ICD-10-CM

## 2017-02-22 MED ORDER — ACETAMINOPHEN 500 MG PO TABS
1000.0000 mg | ORAL_TABLET | Freq: Once | ORAL | Status: AC
  Administered 2017-02-22: 1000 mg via ORAL

## 2017-02-22 MED ORDER — CLARITHROMYCIN 500 MG PO TABS: 500.0000 mg | ORAL_TABLET | Freq: Two times a day (BID) | ORAL | 0 refills | Status: DC

## 2017-02-22 MED ORDER — AMOXICILLIN 500 MG PO CAPS: 1000.0000 mg | ORAL_CAPSULE | Freq: Two times a day (BID) | ORAL | 0 refills | Status: DC

## 2017-02-22 NOTE — Progress Notes (Signed)
    MRN: [id number] DOB: Sep 06, 1965  Subjective:   Ethan Silva is a 52 y.o. male presenting for follow up on epigastric pain. Patient states that he did not get my voicemail about his positive results for H. Pylori and has not gotten a call from his pharmacy for the scripts I sent for amoxicillin and clarithromycin. Today, he reports concern that he has a mass that protrudes from his mid abdomen with bearing down, bending. He had a lap cholecystectomy 2014, has had this mass since then. Denies fever, constipation, bloody stools, diarrhea, nausea with vomiting.     Ethan Silva has a current medication list which includes the following prescription(s): amlodipine, amoxicillin, atorvastatin, clarithromycin, hydroxyzine, lisinopril, meclizine, metformin, and omeprazole. Also has No Known Allergies.  Ethan Silva  has a past medical history of Bile duct leak, Chronic kidney disease (2017), Headache(784.0), Hyperlipidemia, Hypertension, and Numbness and tingling in hands. Also  has a past surgical history that includes Cholecystectomy (N/A, 07/11/2012); ERCP (07/16/2012); ERCP (N/A, 07/16/2012); ERCP (N/A, 10/06/2012); and Colonoscopy.  Objective:   Vitals: BP 126/80   Pulse 61   Temp 98.4 F (36.9 C) (Oral)   Resp 17   Ht 5\' 3"  (1.6 m)   Wt 157 lb (71.2 kg)   SpO2 98%   BMI 27.81 kg/m   Physical Exam  Constitutional: He is oriented to person, place, and time. He appears well-developed and well-nourished.  HENT:  Mouth/Throat: Oropharynx is clear and moist.  Eyes: No scleral icterus.  Cardiovascular: Normal rate, regular rhythm and intact distal pulses. Exam reveals no gallop and no friction rub.  No murmur heard. Pulmonary/Chest: No respiratory distress. He has no wheezes. He has no rales.  Abdominal: Soft. Bowel sounds are normal. He exhibits mass (abdominal mass over mid abdomen near well healed laparoscopic surgical scar). He exhibits no distension. There is no hepatosplenomegaly. There is no  tenderness. There is no rebound and no guarding.  Neurological: He is alert and oriented to person, place, and time.  Skin: Skin is warm and dry.  Psychiatric: He has a normal mood and affect.   Assessment and Plan :   Abdominal pain, epigastric - Plan: acetaminophen (TYLENOL) tablet 1,000 mg  Generalized abdominal pain - Plan: acetaminophen (TYLENOL) tablet 1,000 mg  H. pylori infection  Ventral hernia without obstruction or gangrene - Plan: Ambulatory referral to General Surgery  History of cholecystectomy - Plan: Ambulatory referral to General Surgery  Resent scripts for amoxicillin, clarithromycin. Hold atorvastatin. Counseled patient on potential for adverse effects with medications prescribed today, patient verbalized understanding. Return-to-clinic precautions discussed, patient verbalized understanding. Referral to General Surgery for consult on what is likely a ventral hernia.   Wallis BambergMario Vidhi Delellis, PA-C [facility] [phone] 02/22/2017 8:28 AM

## 2017-02-22 NOTE — Patient Instructions (Addendum)
No tomes atorvastatin mientras que estes tomando el antibiotico de clarithromycin. Tome 500mg  de Tylenol cada 6 horas con comida para dolor y inflammacion.     Infeccin por Helicobacter Pylori (Helicobacter Pylori Infection) La infeccin por Helicobacter pylori es una infeccin en el estmago que es causada por la bacteria Helicobacter pylori (H. pylori). Este tipo de bacteria vive frecuentemente en el revestimiento del estmago. La infeccin puede causar lceras e irritacin (gastritis) en algunas personas. Es la causa ms comn de lceras en el estmago (lcera gstrica) y en la parte superior del intestino (lcera duodenal). Tener esta infeccin tambin puede aumentar el riesgo de cncer de Teaching laboratory technicianestmago y un tipo de cncer de los glbulos blancos (linfoma) que afecta al [place]. CAUSAS H. pylori es un tipo de bacteria que se encuentra frecuentemente en el estmago de las personas saludables. La bacteria puede pasar de Neomia Dearuna persona a otra por contacto a travs de las heces o la saliva. No se sabe por qu algunas personas desarrollan lceras, gastritis o cncer a partir de la infeccin. FACTORES DE RIESGO Es ms probable que esta afeccin se manifieste en las personas que:  Tienen familiares con esta infeccin.  Viven con Lucent Technologiesmuchas otras personas; por ejemplo, en un dormitorio.  Son de origen africano, hispano o asitico. SNTOMAS La mayora de las personas con esta infeccin no tienen sntomas. Si tiene sntomas, estos pueden incluir los siguientes:  Merchant navy officerAcidez estomacal.  Dolor de [place].  Nuseas.  Vmitos.  Sangre en el vmito.  Prdida del apetito.  Mal aliento. DIAGNSTICO Esta afeccin se puede diagnosticar en funcin de los sntomas, un examen fsico y Futures traderdiferentes pruebas. Podrn solicitarle otros estudios, por ejemplo:  Anlisis de sangre o pruebas de materia fecal para verificar las protenas (anticuerpos) que el cuerpo puede producir en respuesta a las bacterias. Estas pruebas  son la mejor manera de confirmar el diagnstico.  Neomia DearUna prueba de aliento para verificar el tipo de gas que la bacteria H. pylori libera despus de descomponer una sustancia llamada urea. Para la prueba, se le pide que beba urea. Esta prueba se realiza frecuentemente despus del tratamiento para saber si el tratamiento funcion.  Un procedimiento en el que un tubo delgado y flexible con una pequea cmara en el extremo se coloca en el estmago y el intestino superior (endoscopia superior). El mdico tambin puede tomar muestras de tejido (biopsia) para Education officer, environmentalrealizar pruebas de H. pylori y cncer. TRATAMIENTO El tratamiento para esta afeccin generalmente implica tomar una combinacin de medicamentos (terapia triple) durante un par de semanas. La terapia triple incluye un medicamento para reducir el cido en el estmago y dos tipos de antibiticos. Se aprobaron muchas combinaciones de frmacos para el tratamiento. El tratamiento generalmente mata a la H. pylori y reduce el riesgo de cncer. Despus del tratamiento, es posible que deba volver a realizarse una prueba de H. pylori. En algunos casos, es posible que sea necesario repetir Scientist, research (medical)el tratamiento. INSTRUCCIONES PARA EL CUIDADO EN EL HOGAR  Tome los medicamentos de venta libre y los recetados solamente como se lo haya indicado el mdico.  Tome los antibiticos como se lo haya indicado el mdico. No deje de tomar los antibiticos aunque comience a sentirse mejor.  Puede retomar todas sus actividades habituales y Educational psychologistcontinuar su dieta habitual.  Tome estas medidas para evitar infecciones futuras: ? Lvese las manos con frecuencia. ? Asegrese de que los alimentos que consume se hayan preparado adecuadamente. ? Beba agua solamente de fuentes limpias.  Concurra a todas las visitas de  control como se lo haya indicado el mdico. Esto es importante. SOLICITE ATENCIN MDICA SI:  Los sntomas no mejoran.  Los sntomas regresan despus del tratamiento. Esta  informacin no tiene Theme park manager el consejo del mdico. Asegrese de hacerle al mdico cualquier pregunta que tenga. Document Released: 04/11/2015 Document Revised: 04/11/2015 Document Reviewed: 12/30/2013 Elsevier Interactive Patient Education  2018 [employer].     IF you received an x-ray today, you will receive an invoice from [facility]. Please contact [facility] at [phone] with questions or concerns regarding your invoice.   IF you received labwork today, you will receive an invoice from [facility]. Please contact [facility] at [phone] with questions or concerns regarding your invoice.   Our billing staff will not be able to assist you with questions regarding bills from these companies.  You will be contacted with the lab results as soon as they are available. The fastest way to get your results is to activate your My Chart account. Instructions are located on the last page of this paperwork. If you have not heard from [place] regarding the results in 2 weeks, please contact this office.

## 2017-02-26 ENCOUNTER — Ambulatory Visit: Payer: BLUE CROSS/BLUE SHIELD | Admitting: Physician Assistant

## 2017-02-28 NOTE — Telephone Encounter (Signed)
[facility]  Re: abd pain  and now left sided pain

## 2017-03-01 NOTE — Telephone Encounter (Signed)
Called pt and left message asking him to call in and make an appt per [facility].   When pt calls in, please make him an appt with Urban Gibson at his convenience.  Thanks!

## 2017-03-01 NOTE — Telephone Encounter (Signed)
Patient needs an OV 

## 2017-03-08 ENCOUNTER — Encounter: Payer: Self-pay | Admitting: Physician Assistant

## 2017-03-08 ENCOUNTER — Ambulatory Visit: Payer: BLUE CROSS/BLUE SHIELD | Admitting: Urgent Care

## 2017-03-08 ENCOUNTER — Ambulatory Visit: Payer: BLUE CROSS/BLUE SHIELD | Admitting: Physician Assistant

## 2017-03-08 ENCOUNTER — Other Ambulatory Visit: Payer: Self-pay

## 2017-03-08 VITALS — BP 130/70 | HR 65 | Temp 98.1°F | Resp 18 | Ht 63.0 in | Wt 151.8 lb

## 2017-03-08 DIAGNOSIS — Z8619 Personal history of other infectious and parasitic diseases: Secondary | ICD-10-CM | POA: Diagnosis not present

## 2017-03-08 DIAGNOSIS — I10 Essential (primary) hypertension: Secondary | ICD-10-CM

## 2017-03-08 DIAGNOSIS — K439 Ventral hernia without obstruction or gangrene: Secondary | ICD-10-CM

## 2017-03-08 MED ORDER — AMLODIPINE BESYLATE 5 MG PO TABS: 5.0000 mg | ORAL_TABLET | Freq: Every day | ORAL | 1 refills | Status: DC

## 2017-03-08 NOTE — Progress Notes (Deleted)
   Subjective:    Patient ID: Ethan Silva, male    DOB: Apr 17, 1965, 52 y.o.   MRN: [id number]  HPI    Review of Systems     Objective:   Physical Exam        Assessment & Plan:

## 2017-03-08 NOTE — Patient Instructions (Signed)
     IF you received an x-ray today, you will receive an invoice from [facility]. Please contact [facility] at [phone] with questions or concerns regarding your invoice.   IF you received labwork today, you will receive an invoice from [facility]. Please contact [facility] at [phone] with questions or concerns regarding your invoice.   Our billing staff will not be able to assist you with questions regarding bills from these companies.  You will be contacted with the lab results as soon as they are available. The fastest way to get your results is to activate your My Chart account. Instructions are located on the last page of this paperwork. If you have not heard from us regarding the results in 2 weeks, please contact this office.     

## 2017-03-08 NOTE — Progress Notes (Signed)
Ethan Silva  MRN: [id number] DOB: 08/07/1965  PCP: Patient, No Pcp Per  Chief Complaint  Patient presents with  . GI Problem    follow up     Subjective:  Pt presents to clinic for recheck of his BP and his H pylori.  He feels much better since finishing his abx.   Dr Kinnie ScalesMedoff - appt on 5/13 Surgeon - has not heard yet about appt  History is obtained by patient.  Review of Systems  Constitutional: Negative for chills and fever.  Eyes: Negative for visual disturbance.  Respiratory: Negative for cough and shortness of breath.   Cardiovascular: Negative for chest pain, palpitations and leg swelling.  Gastrointestinal: Negative for abdominal pain, constipation, diarrhea and nausea.  Neurological: Negative for dizziness, light-headedness and headaches.    Patient Active Problem List   Diagnosis Date Noted  . BMI 30.0-30.9,adult 08/30/2015  . Hyperlipidemia 08/30/2015  . HTN (hypertension) 10/11/2012    Current Outpatient Medications on File Prior to Visit  Medication Sig Dispense Refill  . amoxicillin (AMOXIL) 500 MG capsule Take 2 capsules (1,000 mg total) by mouth 2 (two) times daily. 56 capsule 0  . clarithromycin (BIAXIN) 500 MG tablet Take 1 tablet (500 mg total) by mouth 2 (two) times daily. 28 tablet 0  . lisinopril (PRINIVIL,ZESTRIL) 40 MG tablet Take 1 tablet (40 mg total) by mouth daily. 90 tablet 1  . omeprazole (PRILOSEC) 20 MG capsule Take 1 capsule (20 mg total) by mouth 2 (two) times daily before a meal. 60 capsule 1   No current facility-administered medications on file prior to visit.     No Known Allergies  Past Medical History:  Diagnosis Date  . Bile duct leak   . Chronic kidney disease 2017   left kidney stones  . Headache(784.0)    Hx: of when BP is elevated  . Hyperlipidemia   . Hypertension   . Numbness and tingling in hands    Hx: of   Social History   Social History Narrative   [place].   Lives with his wife and their sons.   [place] in [place], [place] her [place].   Social History   Tobacco Use  . Smoking status: Never Smoker  . Smokeless tobacco: Never Used  Substance Use Topics  . Alcohol use: No  . Drug use: No   family history includes Diabetes in his paternal uncle; Gallstones in his brother and mother.     Objective:  BP 130/70   Pulse 65   Temp 98.1 F (36.7 C) (Oral)   Resp 18   Ht 5\' 3"  (1.6 m)   Wt 151 lb 12.8 oz (68.9 kg)   SpO2 97%   BMI 26.89 kg/m  Body mass index is 26.89 kg/m.  Physical Exam  Constitutional: He is oriented to person, place, and time and well-developed, well-nourished, and in no distress.  HENT:  Head: Normocephalic and atraumatic.  Right Ear: External ear normal.  Left Ear: External ear normal.  Eyes: Conjunctivae are normal.  Neck: Normal range of motion.  Cardiovascular: Normal rate, regular rhythm, normal heart sounds and intact distal pulses.  Pulmonary/Chest: Effort normal and breath sounds normal. He has no wheezes.  Abdominal: Soft. Bowel sounds are normal. There is no tenderness.  Ventral hernia - TTP  Musculoskeletal:       Right lower leg: He exhibits no edema.       Left  lower leg: He exhibits no edema.  Neurological: He is alert and oriented to person, place, and time. Gait normal.  Skin: Skin is warm and dry.  Psychiatric: Mood, memory, affect and judgment normal.    Assessment and Plan :  Essential hypertension - Plan: amLODipine (NORVASC) 5 MG tablet - there is some confusion on whether the patient is taking this medication - his BP today is well controlled - he should continue taking medications that he has been taking for his BP over the last week.  He will recheck in 3 months.    Ventral hernia without obstruction or gangrene - wait to hear for surgeon appt - if he has not heard in 2 weeks contact me  History of Helicobacter pylori infection - feels much better - no  adiditonal medications needed  Benny Lennert PA-C  [facility] at [facility] 03/08/2017 10:23 AM

## 2017-03-19 ENCOUNTER — Ambulatory Visit: Payer: BLUE CROSS/BLUE SHIELD | Admitting: Urgent Care

## 2017-04-10 ENCOUNTER — Encounter: Payer: Self-pay | Admitting: Physician Assistant

## 2017-06-13 ENCOUNTER — Ambulatory Visit: Payer: BLUE CROSS/BLUE SHIELD | Admitting: Urgent Care

## 2017-07-22 ENCOUNTER — Emergency Department (HOSPITAL_COMMUNITY): Payer: BLUE CROSS/BLUE SHIELD

## 2017-07-22 ENCOUNTER — Encounter (HOSPITAL_COMMUNITY): Payer: Self-pay | Admitting: Emergency Medicine

## 2017-07-22 ENCOUNTER — Other Ambulatory Visit: Payer: Self-pay

## 2017-07-22 ENCOUNTER — Emergency Department (HOSPITAL_COMMUNITY)
Admission: EM | Admit: 2017-07-22 | Discharge: 2017-07-22 | Disposition: A | Discharge status: Home / Self Care | Payer: BLUE CROSS/BLUE SHIELD | Attending: Emergency Medicine | Admitting: Emergency Medicine

## 2017-07-22 DIAGNOSIS — R0602 Shortness of breath: Secondary | ICD-10-CM | POA: Insufficient documentation

## 2017-07-22 DIAGNOSIS — R51 Headache: Secondary | ICD-10-CM | POA: Diagnosis not present

## 2017-07-22 DIAGNOSIS — R0789 Other chest pain: Secondary | ICD-10-CM | POA: Insufficient documentation

## 2017-07-22 DIAGNOSIS — N189 Chronic kidney disease, unspecified: Secondary | ICD-10-CM | POA: Diagnosis not present

## 2017-07-22 DIAGNOSIS — R1010 Upper abdominal pain, unspecified: Secondary | ICD-10-CM | POA: Diagnosis not present

## 2017-07-22 DIAGNOSIS — Z79899 Other long term (current) drug therapy: Secondary | ICD-10-CM | POA: Insufficient documentation

## 2017-07-22 DIAGNOSIS — I129 Hypertensive chronic kidney disease with stage 1 through stage 4 chronic kidney disease, or unspecified chronic kidney disease: Secondary | ICD-10-CM | POA: Diagnosis not present

## 2017-07-22 DIAGNOSIS — R531 Weakness: Secondary | ICD-10-CM | POA: Diagnosis not present

## 2017-07-22 DIAGNOSIS — R110 Nausea: Secondary | ICD-10-CM | POA: Diagnosis not present

## 2017-07-22 DIAGNOSIS — I10 Essential (primary) hypertension: Secondary | ICD-10-CM

## 2017-07-22 LAB — CBC
HCT: 51.6 % (ref 39.0–52.0)
Hemoglobin: 17.6 g/dL — ABNORMAL HIGH (ref 13.0–17.0)
MCH: 30 pg (ref 26.0–34.0)
MCHC: 34.1 g/dL (ref 30.0–36.0)
MCV: 87.9 fL (ref 78.0–100.0)
Platelets: 222 10*3/uL (ref 150–400)
RBC: 5.87 MIL/uL — ABNORMAL HIGH (ref 4.22–5.81)
RDW: 12.8 % (ref 11.5–15.5)
WBC: 7 10*3/uL (ref 4.0–10.5)

## 2017-07-22 LAB — I-STAT TROPONIN, ED
Troponin i, poc: 0.01 ng/mL (ref 0.00–0.08)
Troponin i, poc: 0.01 ng/mL (ref 0.00–0.08)

## 2017-07-22 LAB — BASIC METABOLIC PANEL
Anion gap: 13 (ref 5–15)
BUN: 13 mg/dL (ref 6–20)
CO2: 25 mmol/L (ref 22–32)
Calcium: 9 mg/dL (ref 8.9–10.3)
Chloride: 102 mmol/L (ref 98–111)
Creatinine, Ser: 1.1 mg/dL (ref 0.61–1.24)
GFR calc Af Amer: >60 mL/min (ref >60)
GFR calc non Af Amer: >60 mL/min (ref >60)
Glucose, Bld: 115 mg/dL — ABNORMAL HIGH (ref 70–99)
Potassium: 3.9 mmol/L (ref 3.5–5.1)
Sodium: 140 mmol/L (ref 135–145)

## 2017-07-22 LAB — HEPATIC FUNCTION PANEL
ALT: 30 U/L (ref 0–44)
AST: 26 U/L (ref 15–41)
Albumin: 4 g/dL (ref 3.5–5.0)
Alkaline Phosphatase: 49 U/L (ref 38–126)
Bilirubin, Direct: 0.2 mg/dL (ref 0.0–0.2)
Indirect Bilirubin: 0.6 mg/dL (ref 0.3–0.9)
Total Bilirubin: 0.8 mg/dL (ref 0.3–1.2)
Total Protein: 7.1 g/dL (ref 6.5–8.1)

## 2017-07-22 LAB — LIPASE, BLOOD: Lipase: 36 U/L (ref 11–51)

## 2017-07-22 LAB — BRAIN NATRIURETIC PEPTIDE: B Natriuretic Peptide: 10.3 pg/mL (ref 0.0–100.0)

## 2017-07-22 LAB — D-DIMER, QUANTITATIVE: D-Dimer, Quant: <0.27 ug/mL-FEU (ref 0.00–0.50)

## 2017-07-22 MED ORDER — AMLODIPINE BESYLATE 5 MG PO TABS: 10.0000 mg | ORAL_TABLET | Freq: Every day | ORAL | 0 refills | Status: DC

## 2017-07-22 MED ORDER — CLONIDINE HCL 0.1 MG PO TABS
0.1000 mg | ORAL_TABLET | Freq: Once | ORAL | Status: AC
  Administered 2017-07-22: 0.1 mg via ORAL
  Filled 2017-07-22: qty 1

## 2017-07-22 MED ORDER — GI COCKTAIL ~~LOC~~
30.0000 mL | Freq: Once | ORAL | Status: AC
  Administered 2017-07-22: 30 mL via ORAL
  Filled 2017-07-22: qty 30

## 2017-07-22 NOTE — Discharge Instructions (Addendum)
As we discussed, we are going to increase your amlodipine.  You are currently taking 1 tablet of 5 mg.  We are going to increase this to 10 mg total.  So, instead of 1 tablet of amlodipine, you will now take 2.  I have given you a new prescription.  It is important that you do not take double doses of amlodipine so please be cautious.

## 2017-07-22 NOTE — ED Notes (Signed)
Pt stable, ambulatory, and verbalizes understanding of d/c instructions.  

## 2017-07-22 NOTE — ED Provider Notes (Signed)
52 yo M with h/o HTN, HLD, chronic epigastric/abdominal pain 2/2 suspected PUD here with epigastric/chest pain with nausea. S/p cholecystectomy c/b biliary leak. VSS. Labs are reassuring, initial trop neg. EKG normal.  Clinical Course as of Jul 22 1052  Mon Jul 22, 2017  [id number] Discussed with Dr. Blinda LeatherwoodPollina.  Repeat troponin ordered, in addition to d-dimer and BNP.   [CI]  0908 Repeat trop negative.    [CI]  1053 Symptoms are resolved and patient is improving.  He has had no chest pain or shortness of breath here.  No other complaints.  Symptoms may be secondary to symptomatic hypertension.  He notes his blood pressure has been steadily increasing at home.  Will increase his baseline amlodipine and have him follow-up with his doctor.  Return precautions given.   [CI]    Clinical Course User Index [CI] Shaune PollackIsaacs, Haylynn Pha, MD      Shaune PollackIsaacs, Emilian Stawicki, MD 07/22/17 1054

## 2017-07-22 NOTE — ED Provider Notes (Addendum)
[facility] EMERGENCY DEPARTMENT Provider Note   CSN: [id number] Arrival date & time: 07/22/17  0315     History   Chief Complaint Chief Complaint  Patient presents with  . Chest Pain    HPI Ethan Silva is a 52 y.o. male.  Patient presents to the emergency department for evaluation of shortness of breath.  Patient reports that he awakened from sleep and was having trouble breathing.  This did not last long and then resolved.  He took his blood pressure and it was very elevated, 190 systolic.  At arrival to the ER, patient is no longer having significant shortness of breath.  He reports that he has a slight headache and he feels weak all over.  He has been experiencing nausea but no vomiting.  There is some mild upper abdominal discomfort, this appears to be somewhat chronic for him.     Past Medical History:  Diagnosis Date  . Bile duct leak   . Chronic kidney disease 2017   left kidney stones  . Headache(784.0)    Hx: of when BP is elevated  . Hyperlipidemia   . Hypertension   . Numbness and tingling in hands    Hx: of    Patient Active Problem List   Diagnosis Date Noted  . BMI 30.0-30.9,adult 08/30/2015  . Hyperlipidemia 08/30/2015  . HTN (hypertension) 10/11/2012    Past Surgical History:  Procedure Laterality Date  . CHOLECYSTECTOMY N/A 07/11/2012   Procedure: LAPAROSCOPIC CHOLECYSTECTOMY WITH INTRAOPERATIVE CHOLANGIOGRAM;  Surgeon: Axel Filler, MD;  Location: [facility] OR;  Service: General;  Laterality: N/A;  . COLONOSCOPY    . ERCP  07/16/2012  . ERCP N/A 07/16/2012   Procedure: ENDOSCOPIC RETROGRADE CHOLANGIOPANCREATOGRAPHY (ERCP);  Surgeon: Louis Meckel, MD;  Location[facility] OR;  Service: Gastroenterology;  Laterality: N/A;  . ERCP N/A 10/06/2012   Procedure: ENDOSCOPIC RETROGRADE CHOLANGIOPANCREATOGRAPHY (ERCP);  Surgeon: Louis Meckel, MD;  Location: [facility];  Service: Endoscopy;  Laterality: N/A;        Home  Medications    Prior to Admission medications   Medication Sig Start Date End Date Taking? Authorizing Provider  lisinopril (PRINIVIL,ZESTRIL) 40 MG tablet Take 1 tablet (40 mg total) by mouth daily. 02/19/17  Yes [name], PA-C  amLODipine (NORVASC) 5 MG tablet Take 2 tablets (10 mg total) by mouth daily. 07/22/17 08/21/17  Shaune Pollack, MD  amoxicillin (AMOXIL) 500 MG capsule Take 2 capsules (1,000 mg total) by mouth 2 (two) times daily. Patient not taking: Reported on 07/22/2017 02/22/17   [name], PA-C  clarithromycin (BIAXIN) 500 MG tablet Take 1 tablet (500 mg total) by mouth 2 (two) times daily. Patient not taking: Reported on 07/22/2017 02/22/17   [name], PA-C  omeprazole (PRILOSEC) 20 MG capsule Take 1 capsule (20 mg total) by mouth 2 (two) times daily before a meal. Patient not taking: Reported on 07/22/2017 02/21/17   [name], PA-C    Family History Family History  Problem Relation Age of Onset  . Gallstones Mother   . Diabetes Paternal Uncle   . Gallstones Brother     Social History Social History   Tobacco Use  . Smoking status: Never Smoker  . Smokeless tobacco: Never Used  Substance Use Topics  . Alcohol use: No  . Drug use: No     Allergies   Patient has no known allergies.   Review of Systems Review of Systems  Respiratory: Positive for shortness of breath.   Gastrointestinal:  Positive for abdominal pain and nausea.  All other systems reviewed and are negative.    Physical Exam Updated Vital Signs BP (!) 116/91   Pulse 60   Temp 98.8 F (37.1 C) (Oral)   Resp 17   Ht 5\' 3"  (1.6 m)   Wt 74.4 kg (164 lb)   SpO2 94%   BMI 29.05 kg/m   Physical Exam  Constitutional: He is oriented to person, place, and time. He appears well-developed and well-nourished. No distress.  HENT:  Head: Normocephalic and atraumatic.  Right Ear: Hearing normal.  Left Ear: Hearing normal.  Nose: Nose normal.  Mouth/Throat: Oropharynx is clear and moist  and mucous membranes are normal.  Eyes: Pupils are equal, round, and reactive to light. Conjunctivae and EOM are normal.  Neck: Normal range of motion. Neck supple.  Cardiovascular: Regular rhythm, S1 normal and S2 normal. Exam reveals no gallop and no friction rub.  No murmur heard. Pulmonary/Chest: Effort normal and breath sounds normal. No respiratory distress. He exhibits no tenderness.  Abdominal: Soft. Normal appearance and bowel sounds are normal. There is no hepatosplenomegaly. There is tenderness in the right upper quadrant and epigastric area. There is no rebound, no guarding, no tenderness at McBurney's point and negative Murphy's sign. No hernia.  Musculoskeletal: Normal range of motion.  Neurological: He is alert and oriented to person, place, and time. He has normal strength. No cranial nerve deficit or sensory deficit. Coordination normal. GCS eye subscore is 4. GCS verbal subscore is 5. GCS motor subscore is 6.  Skin: Skin is warm, dry and intact. No rash noted. No cyanosis.  Psychiatric: He has a normal mood and affect. His speech is normal and behavior is normal. Thought content normal.  Nursing note and vitals reviewed.    ED Treatments / Results  Labs (all labs ordered are listed, but only abnormal results are displayed) Labs Reviewed  BASIC METABOLIC PANEL - Abnormal; Notable for the following components:      Result Value   Glucose, Bld 115 (*)    All other components within normal limits  CBC - Abnormal; Notable for the following components:   RBC 5.87 (*)    Hemoglobin 17.6 (*)    All other components within normal limits  HEPATIC FUNCTION PANEL  LIPASE, BLOOD  D-DIMER, QUANTITATIVE (NOT AT [facility])  BRAIN NATRIURETIC PEPTIDE  I-STAT TROPONIN, ED  I-STAT TROPONIN, ED    EKG EKG Interpretation  Date/Time:  Monday July 22 2017 03:22:12 EDT Ventricular Rate:  79 PR Interval:  150 QRS Duration: 78 QT Interval:  384 QTC Calculation: 440 R Axis:   10 Text  Interpretation:  Normal sinus rhythm Normal ECG Confirmed by Gilda CreasePollina, Christopher J [phone]([id number]) on 07/22/2017 6:07:55 AM Also confirmed by Gilda CreasePollina, Christopher J [phone]([id number]), editor Barbette Hairassel, Kerry [phone]([id number])  on 07/22/2017 7:06:49 AM   Radiology No results found.  Procedures Procedures (including critical care time)  Medications Ordered in ED Medications  gi cocktail (Maalox,Lidocaine,Donnatal) (30 mLs Oral Given 07/22/17 0542)  cloNIDine (CATAPRES) tablet 0.1 mg (0.1 mg Oral Given 07/22/17 0914)     Initial Impression / Assessment and Plan / ED Course  I have reviewed the triage vital signs and the nursing notes.  Pertinent labs & imaging results that were available during my care of the patient were reviewed by me and considered in my medical decision making (see chart for details).  Clinical Course as of Aug 01 227  Mon Jul 22, 2017  [id number] Discussed with Dr.  Pollina.  Repeat troponin ordered, in addition to d-dimer and BNP.   [CI]  0908 Repeat trop negative.    [CI]  1053 Symptoms are resolved and patient is improving.  He has had no chest pain or shortness of breath here.  No other complaints.  Symptoms may be secondary to symptomatic hypertension.  He notes his blood pressure has been steadily increasing at home.  Will increase his baseline amlodipine and have him follow-up with his doctor.  Return precautions given.   [CI]    Clinical Course User Index [CI] Shaune Pollack, MD    Patient presents to the emergency department for evaluation of sudden onset of shortness of breath while sleeping.  Shortness of breath resolved spontaneously, then he became nauseated.  No vomiting.  Patient does have some recurrence and chronic abdominal complaints.  He was recently seen at gastroenterology at [facility] for this, and endoscopy is planned.  He does not have a gallbladder.  EKG at arrival is normal.  Troponin is normal.  Patient does have hypertension and high cholesterol, reports no family history  of heart disease.  Initial information was unavailable because there was no Nurse, learning disability.  Further information obtained from the patient with translator.  He is still feeling slightly short of breath, will add on BNP, d-dimer.  We will have second troponin drawn as well.  Signed out to oncoming ER physician to follow-up.  Final Clinical Impressions(s) / ED Diagnoses   Final diagnoses:  Shortness of breath  Essential hypertension  Atypical chest pain    ED Discharge Orders        Ordered    amLODipine (NORVASC) 5 MG tablet  Daily     07/22/17 1055       Gilda Crease, MD 07/22/17 6433    Gilda Crease, MD 07/22/17 2951    Gilda Crease, MD 07/31/17 [phone]

## 2017-07-22 NOTE — ED Triage Notes (Addendum)
Pt woke up with pain to center of chest, sob, and nausea just pta.  Pt hypertensive at triage.

## 2017-07-25 ENCOUNTER — Ambulatory Visit: Payer: BLUE CROSS/BLUE SHIELD | Admitting: Urgent Care

## 2017-07-30 ENCOUNTER — Ambulatory Visit: Payer: BLUE CROSS/BLUE SHIELD | Admitting: Urgent Care

## 2017-09-20 ENCOUNTER — Ambulatory Visit: Payer: BLUE CROSS/BLUE SHIELD | Admitting: Family Medicine

## 2017-09-20 ENCOUNTER — Other Ambulatory Visit: Payer: Self-pay

## 2017-09-20 ENCOUNTER — Encounter: Payer: Self-pay | Admitting: Family Medicine

## 2017-09-20 VITALS — BP 146/89 | HR 60 | Temp 98.6°F | Ht 63.0 in | Wt 157.0 lb

## 2017-09-20 DIAGNOSIS — R7303 Prediabetes: Secondary | ICD-10-CM | POA: Diagnosis not present

## 2017-09-20 DIAGNOSIS — I10 Essential (primary) hypertension: Secondary | ICD-10-CM | POA: Diagnosis not present

## 2017-09-20 DIAGNOSIS — Z23 Encounter for immunization: Secondary | ICD-10-CM

## 2017-09-20 DIAGNOSIS — E782 Mixed hyperlipidemia: Secondary | ICD-10-CM | POA: Diagnosis not present

## 2017-09-20 MED ORDER — AMLODIPINE BESYLATE 10 MG PO TABS: 10.0000 mg | ORAL_TABLET | Freq: Every day | ORAL | 1 refills | Status: DC

## 2017-09-20 NOTE — Progress Notes (Signed)
9/20/201911:59 AM  Ethan Silva [birth year], 52 y.o. male [id number]  Chief Complaint  Patient presents with  . Hypertension    follow up on bp, monitors daily, bp is very high at home    HPI:   Patient is a 52 y.o. male with past medical history significant for HTN and HLP who presents today for blood pressure  recently when he lies flat he gets headache, slightly dizzy, pressure and when he has checks his blood pressure it has been in the 150/90 Has decreased salt intake Does not drink etoh nor caffeine + snores  Taking only amlodipine 90m daily Not taking lisinopril anymore, not sure is stopped or ran out Used to be on metformin, a1c 6.2 08/2016 No sign dietary changes, does not check cbgs at home   Fall Risk  09/20/2017 02/22/2017 01/23/2017 09/06/2016 08/01/2016  Falls in the past year? _0      [facility] 09/20/2017 02/22/2017 01/23/2017  Decreased Interest 0 0 0  Down, Depressed, Hopeless 0 0 0  PHQ - 2 Score 0 0 0    No Known Allergies  Prior to Admission medications   Medication Sig Start Date End Date Taking? Authorizing Provider  amoxicillin (AMOXIL) 500 MG capsule Take 2 capsules (1,000 mg total) by mouth 2 (two) times daily. 02/22/17  Yes MJaynee Eagles PA-C  clarithromycin (BIAXIN) 500 MG tablet Take 1 tablet (500 mg total) by mouth 2 (two) times daily. 02/22/17  Yes MJaynee Eagles PA-C  lisinopril (PRINIVIL,ZESTRIL) 40 MG tablet Take 1 tablet (40 mg total) by mouth daily. 02/19/17  Yes MJaynee Eagles PA-C  omeprazole (PRILOSEC) 20 MG capsule Take 1 capsule (20 mg total) by mouth 2 (two) times daily before a meal. 02/21/17  Yes MJaynee Eagles PA-C  amLODipine (NORVASC) 5 MG tablet Take 2 tablets (10 mg total) by mouth daily. 07/22/17 08/21/17  IDuffy Bruce MD    Past Medical History:  Diagnosis Date  . Bile duct leak   . Chronic kidney disease 2017   left kidney stones  . Headache(784.0)    Hx: of when BP is elevated  . Hyperlipidemia     . Hypertension   . Numbness and tingling in hands    Hx: of    Past Surgical History:  Procedure Laterality Date  . CHOLECYSTECTOMY N/A 07/11/2012   Procedure: LAPAROSCOPIC CHOLECYSTECTOMY WITH INTRAOPERATIVE CHOLANGIOGRAM;  Surgeon: ARalene Ok MD;  [facility]  Service: General;  Laterality: N/A;  . COLONOSCOPY    . ERCP  07/16/2012  . ERCP N/A 07/16/2012   Procedure: ENDOSCOPIC RETROGRADE CHOLANGIOPANCREATOGRAPHY (ERCP);  Surgeon: RInda Castle MD;  [facility]  Service: Gastroenterology;  Laterality: N/A;  . ERCP N/A 10/06/2012   Procedure: ENDOSCOPIC RETROGRADE CHOLANGIOPANCREATOGRAPHY (ERCP);  Surgeon: RInda Castle MD;  [facility];  Service: Endoscopy;  Laterality: N/A;    Social History   Tobacco Use  . Smoking status: Never Smoker  . Smokeless tobacco: Never Used  Substance Use Topics  . Alcohol use: No    Family History  Problem Relation Age of Onset  . Gallstones Mother   . Diabetes Paternal Uncle   . Gallstones Brother     Review of Systems  Constitutional: Negative for chills and fever.  Respiratory: Negative for cough and shortness of breath.   Cardiovascular: Negative for chest pain, palpitations and leg swelling.  Gastrointestinal: Negative for abdominal pain, nausea and vomiting.     OBJECTIVE:  Blood pressure (Marland Kitchen  146/89, pulse 60, temperature 98.6 F (37 C), temperature source Oral, height _0  (1.6 m), weight 157 lb (71.2 kg), SpO2 96 %. Body mass index is 27.81 kg/m.   Wt Readings from Last 3 Encounters:  09/20/17 157 lb (71.2 kg)  07/22/17 164 lb (74.4 kg)  03/08/17 151 lb 12.8 oz (68.9 kg)    Physical Exam  Constitutional: He is oriented to person, place, and time. He appears well-developed and well-nourished.  HENT:  Head: Normocephalic and atraumatic.  Mouth/Throat: Oropharynx is clear and moist.  Eyes: Pupils are equal, round, and reactive to light. Conjunctivae and EOM are normal.  Neck: Neck supple.   Cardiovascular: Normal rate and regular rhythm. Exam reveals no gallop and no friction rub.  No murmur heard. Pulmonary/Chest: Effort normal and breath sounds normal. He has no wheezes. He has no rales.  Musculoskeletal: He exhibits no edema.  Neurological: He is alert and oriented to person, place, and time.  Skin: Skin is warm and dry.  Psychiatric: He has a normal mood and affect.  Nursing note and vitals reviewed.   ASSESSMENT and PLAN  1. Essential hypertension Uncontrolled, increasing amlodipine - CMP14+EGFR - CBC - TSH - amLODipine (NORVASC) 10 MG tablet; Take 1 tablet (10 mg total) by mouth daily.  2. Mixed hyperlipidemia Checking labs today, medications will be adjusted as needed.  - Lipid panel - TSH  3. Pre-diabetes Checking labs today, medications will be adjusted as needed.  - Hemoglobin A1c  4. Need for immunization against influenza - Flu Vaccine QUAD 36+ mos IM   Return in about 4 weeks (around 10/18/2017) for blood pressure and labs.    Rutherford Guys, MD Primary Care at [place], [place] [address] Ph.  [phone] Fax [phone]

## 2017-09-20 NOTE — Patient Instructions (Signed)
° ° ° °  If you have lab work done today you will be contacted with your lab results within the next 2 weeks.  If you have not heard from us then please contact us. The fastest way to get your results is to register for My Chart. ° ° °IF you received an x-ray today, you will receive an invoice from [facility]. Please contact [facility] at [phone] with questions or concerns regarding your invoice.  ° °IF you received labwork today, you will receive an invoice from [facility]. Please contact [facility] at [phone] with questions or concerns regarding your invoice.  ° °Our billing staff will not be able to assist you with questions regarding bills from these companies. ° °You will be contacted with the lab results as soon as they are available. The fastest way to get your results is to activate your My Chart account. Instructions are located on the last page of this paperwork. If you have not heard from us regarding the results in 2 weeks, please contact this office. °  ° ° ° °

## 2017-09-21 LAB — CMP14+EGFR
ALT: 29 IU/L (ref 0–44)
AST: 24 IU/L (ref 0–40)
Albumin/Globulin Ratio: 1.9 (ref 1.2–2.2)
Albumin: 4.6 g/dL (ref 3.5–5.5)
Alkaline Phosphatase: 58 IU/L (ref 39–117)
BUN/Creatinine Ratio: 16 (ref 9–20)
BUN: 12 mg/dL (ref 6–24)
Bilirubin Total: 0.5 mg/dL (ref 0.0–1.2)
CO2: 23 mmol/L (ref 20–29)
Calcium: 9.2 mg/dL (ref 8.7–10.2)
Chloride: 102 mmol/L (ref 96–106)
Creatinine, Ser: 0.77 mg/dL (ref 0.76–1.27)
GFR calc Af Amer: 119 mL/min/{1.73_m2} (ref >59)
GFR calc non Af Amer: 103 mL/min/{1.73_m2} (ref >59)
Globulin, Total: 2.4 g/dL (ref 1.5–4.5)
Glucose: 92 mg/dL (ref 65–99)
Potassium: 4.2 mmol/L (ref 3.5–5.2)
Sodium: 143 mmol/L (ref 134–144)
Total Protein: 7 g/dL (ref 6.0–8.5)

## 2017-09-21 LAB — HEMOGLOBIN A1C
Est. average glucose Bld gHb Est-mCnc: 114 mg/dL
Hgb A1c MFr Bld: 5.6 % (ref 4.8–5.6)

## 2017-09-21 LAB — TSH: TSH: 1.97 u[IU]/mL (ref 0.450–4.500)

## 2017-09-21 LAB — LIPID PANEL
Chol/HDL Ratio: 6.8 ratio — ABNORMAL HIGH (ref 0.0–5.0)
Cholesterol, Total: 273 mg/dL — ABNORMAL HIGH (ref 100–199)
HDL: 40 mg/dL (ref >39)
LDL Calculated: 203 mg/dL — ABNORMAL HIGH (ref 0–99)
Triglycerides: 151 mg/dL — ABNORMAL HIGH (ref 0–149)
VLDL Cholesterol Cal: 30 mg/dL (ref 5–40)

## 2017-09-21 LAB — CBC
Hematocrit: 50.9 % (ref 37.5–51.0)
Hemoglobin: 17 g/dL (ref 13.0–17.7)
MCH: 29.6 pg (ref 26.6–33.0)
MCHC: 33.4 g/dL (ref 31.5–35.7)
MCV: 89 fL (ref 79–97)
Platelets: 264 10*3/uL (ref 150–450)
RBC: 5.74 x10E6/uL (ref 4.14–5.80)
RDW: 14.2 % (ref 12.3–15.4)
WBC: 6.2 10*3/uL (ref 3.4–10.8)

## 2017-09-25 MED ORDER — ATORVASTATIN CALCIUM 40 MG PO TABS: 40.0000 mg | ORAL_TABLET | Freq: Every day | ORAL | 3 refills | Status: DC

## 2017-09-25 NOTE — Addendum Note (Signed)
Addended by: Myles Lipps on: 09/25/2017 11:05 PM   Modules accepted: Orders

## 2017-10-21 ENCOUNTER — Encounter: Payer: Self-pay | Admitting: Family Medicine

## 2017-10-21 ENCOUNTER — Ambulatory Visit: Payer: BLUE CROSS/BLUE SHIELD | Admitting: Family Medicine

## 2017-10-21 VITALS — BP 147/87 | HR 66 | Temp 98.9°F | Resp 16 | Ht 62.0 in | Wt 162.8 lb

## 2017-10-21 DIAGNOSIS — I10 Essential (primary) hypertension: Secondary | ICD-10-CM | POA: Diagnosis not present

## 2017-10-21 DIAGNOSIS — E782 Mixed hyperlipidemia: Secondary | ICD-10-CM | POA: Diagnosis not present

## 2017-10-21 MED ORDER — ATORVASTATIN CALCIUM 40 MG PO TABS: 40.0000 mg | ORAL_TABLET | Freq: Every day | ORAL | 3 refills | Status: DC

## 2017-10-21 MED ORDER — AMLODIPINE BESYLATE 10 MG PO TABS: 10.0000 mg | ORAL_TABLET | Freq: Every day | ORAL | 1 refills | Status: DC

## 2017-10-21 MED ORDER — LISINOPRIL 20 MG PO TABS: 20.0000 mg | ORAL_TABLET | Freq: Every day | ORAL | 3 refills | Status: DC

## 2017-10-21 NOTE — Progress Notes (Signed)
10/21/20198:48 AM  Sandie Ano 1965-09-25, 52 y.o. male [id number]  Chief Complaint  Patient presents with  . Hypertension    f/u 4 weeks  . Labs    per pt "fasting"    HPI:   Patient is a 52 y.o. male with past medical history significant for HTN, HLP and pre-diabetes who presents today for BP and labs followup  Last visit amlodipine increased to 10mg  Takes every night wo any issues  Did not pick up atrovastatin  Lab Results  Component Value Date   HGBA1C 5.6 09/20/2017   HGBA1C 6.2 08/01/2016   HGBA1C 5.8 (H) 08/23/2013   Lab Results  Component Value Date   LDLCALC 203 (H) 09/20/2017   CREATININE 0.77 09/20/2017    Fall Risk  10/21/2017 09/20/2017 02/22/2017 01/23/2017 09/06/2016  Falls in the past year? No No No No No     Depression screen [facility] 2/9 10/21/2017 09/20/2017 02/22/2017  Decreased Interest 0 0 0  Down, Depressed, Hopeless 0 0 0  PHQ - 2 Score 0 0 0    No Known Allergies  Prior to Admission medications   Medication Sig Start Date End Date Taking? Authorizing Provider  amLODipine (NORVASC) 10 MG tablet Take 1 tablet (10 mg total) by mouth daily. 09/20/17  Yes Myles Lipps, MD  atorvastatin (LIPITOR) 40 MG tablet Take 1 tablet (40 mg total) by mouth daily. Patient not taking: Reported on 10/21/2017 09/25/17   Myles Lipps, MD    Past Medical History:  Diagnosis Date  . Bile duct leak   . Chronic kidney disease 2017   left kidney stones  . Headache(784.0)    Hx: of when BP is elevated  . Hyperlipidemia   . Hypertension   . Numbness and tingling in hands    Hx: of  . Prediabetes     Past Surgical History:  Procedure Laterality Date  . CHOLECYSTECTOMY N/A 07/11/2012   Procedure: LAPAROSCOPIC CHOLECYSTECTOMY WITH INTRAOPERATIVE CHOLANGIOGRAM;  Surgeon: Axel Filler, MD;  [facility];  Service: General;  Laterality: N/A;  . COLONOSCOPY    . ERCP  07/16/2012  . ERCP N/A 07/16/2012   Procedure: ENDOSCOPIC RETROGRADE  CHOLANGIOPANCREATOGRAPHY (ERCP);  Surgeon: Louis Meckel, MD;  [facility];  Service: Gastroenterology;  Laterality: N/A;  . ERCP N/A 10/06/2012   Procedure: ENDOSCOPIC RETROGRADE CHOLANGIOPANCREATOGRAPHY (ERCP);  Surgeon: Louis Meckel, MD;  [facility];  Service: Endoscopy;  Laterality: N/A;    Social History   Tobacco Use  . Smoking status: Never Smoker  . Smokeless tobacco: Never Used  Substance Use Topics  . Alcohol use: No    Family History  Problem Relation Age of Onset  . Gallstones Mother   . Diabetes Paternal Uncle   . Gallstones Brother     Review of Systems  Constitutional: Negative for chills and fever.  Respiratory: Negative for cough and shortness of breath.   Cardiovascular: Negative for chest pain, palpitations and leg swelling.  Gastrointestinal: Negative for abdominal pain, nausea and vomiting.     OBJECTIVE:  Blood pressure (!) 147/87, pulse 66, temperature 98.9 F (37.2 C), temperature source Oral, resp. rate 16, height 5\' 2"  (1.575 m), weight 162 lb 12.8 oz (73.8 kg), SpO2 97 %. Body mass index is 29.78 kg/m.   BP Readings from Last 3 Encounters:  10/21/17 (!) 147/87  09/20/17 (!) 146/89  07/22/17 (!) 116/91    Physical Exam  Constitutional: He is oriented to person, place, and time. He appears  well-developed and well-nourished.  HENT:  Head: Normocephalic and atraumatic.  Mouth/Throat: Oropharynx is clear and moist.  Eyes: Pupils are equal, round, and reactive to light. Conjunctivae and EOM are normal.  Neck: Neck supple.  Cardiovascular: Normal rate and regular rhythm. Exam reveals no gallop and no friction rub.  No murmur heard. Pulmonary/Chest: Effort normal and breath sounds normal. He has no wheezes. He has no rales.  Musculoskeletal: He exhibits no edema.  Neurological: He is alert and oriented to person, place, and time.  Skin: Skin is warm and dry.  Psychiatric: He has a normal mood and affect.  Nursing note and  vitals reviewed.   ASSESSMENT and PLAN  1. Essential hypertension Improved but still not at goal, adding lisinopril, discussed home monitoring of bp. Discussed new med r/se/b.  - Comprehensive metabolic panel; Future - Lipid panel; Future - amLODipine (NORVASC) 10 MG tablet; Take 1 tablet (10 mg total) by mouth daily.  2. Mixed hyperlipidemia Uncontrolled. Starting atorvastatin 40mg . Discussed new med r/se/b. Discussed diet and exercise. - Lipid panel; Future  Discussed labs one week before our next appt  Other orders - atorvastatin (LIPITOR) 40 MG tablet; Take 1 tablet (40 mg total) by mouth daily. - lisinopril (PRINIVIL,ZESTRIL) 20 MG tablet; Take 1 tablet (20 mg total) by mouth daily.  Return in about 3 months (around 01/21/2018).    Myles Lipps, MD Primary Care at [facility] [address] [place], [place] [address] Ph.  [phone] Fax [phone]

## 2017-10-21 NOTE — Patient Instructions (Addendum)
La meta para la presion de 140/90 o menos  venir una semana antes de nuestra proxima cita para hacerse pruebas de [place]  If you have lab work done today you will be contacted with your lab results within the next 2 weeks.  If you have not heard from [place] then please contact us. The fastest way to get your results is to register for My Chart.   IF you received an x-ray today, you will receive an invoice from [facility]. Please contact [facility] at [phone] with questions or concerns regarding your invoice.   IF you received labwork today, you will receive an invoice from [facility]. Please contact [facility] at [phone] with questions or concerns regarding your invoice.   Our billing staff will not be able to assist you with questions regarding bills from these companies.  You will be contacted with the lab results as soon as they are available. The fastest way to get your results is to activate your My Chart account. Instructions are located on the last page of this paperwork. If you have not heard from [place] regarding the results in 2 weeks, please contact this office.     Colesterol elevado High Cholesterol El colesterol elevado es una afeccin en la que la sangre tiene niveles altos de una sustancia [place], cerosa y parecida a la grasa (colesterol). El organismo humano necesita una pequea cantidad de colesterol. El hgado fabrica todo el colesterol que el organismo necesita. El exceso de colesterol proviene de los alimentos que comemos. La sangre transporta el colesterol desde el hgado a travs de los vasos sanguneos. Si tiene el colesterol elevado, este puede depositarse (formar placas) en las paredes de los vasos sanguneos (arterias). Las IT trainer y la rigidez de las arterias. Las placas de colesterol aumentan el riesgo de sufrir un infarto de miocardio y un accidente cerebrovascular. Trabaje con el mdico para [employer] concentraciones  de colesterol en un rango saludable. Qu incrementa el riesgo? Es ms probable que Dietitian en las personas que:  Consumen alimentos con alto contenido de grasa animal (grasa saturada) o colesterol.  Tienen sobrepeso.  No hacen suficiente ejercicio fsico.  Tienen antecedentes familiares de colesterol elevado.  Cules son los signos o los sntomas? Esta afeccin no presenta sntomas. Cmo se diagnostica? Esta afeccin podra diagnosticarse a [employer] de [place] de [place].  Si es mayor de 20aos, es posible que el mdico le controle el colesterol cada [id number].  Los controles pueden ser ms frecuentes si ya tuvo el colesterol elevado u otros factores de riesgo de enfermedades cardacas.  En el anlisis de sangre de [place], se determina lo siguiente:  El colesterol "malo" (colesterol LDL). Este es el principal tipo de colesterol que causa enfermedades cardacas. El nivel recomendado de LDL es de menos de100.  El colesterol "bueno" (colesterol HDL). Este tipo ayuda a Health visitor las enfermedades cardacas limpiando las arterias y arrastrando el LDL. El nivel recomendado de HDL es de60 o superior.  Triglicridos. Estos son grasas que el organismo puede Academic librarian o quemar como fuente de [place]. El nivel recomendado de triglicridos es de menos de 150.  Colesterol total. Esta es una medicin de la cantidad total de colesterol en la sangre, que incluye el colesterol LDL, el colesterol HDL y los triglicridos. El valor saludable es de menos de200.  Cmo se trata? Esta afeccin se trata con cambios en la dieta y en el estilo de vida, y con medicamentos. Cambios  en la [employer], la ingesta de una mayor cantidad de cereales integrales, frutas, verduras, frutos secos y pescado.  Tambin podran incluir la reduccin del consumo de carnes rojas y alimentos con Medical laboratory scientific officer. Cambios en el estilo de vida  Entre ellos,  realizar sesiones de ejercicios aerbicos durante, por lo menos, , 3veces por semana. Por ejemplo, caminar, andar en bicicleta y nadar. Los ejercicios aerbicos junto con una dieta sana pueden ayudar a que se Dietitian en un peso saludable.  Los cambios tambin podran incluir dejar de fumar. Medicamentos  Por lo general, se administran medicamentos si con los Mudlogger y en el estilo de vida no se logra reducir el colesterol hasta niveles saludables.  El mdico podra recetarle estatinas. Se ha demostrado que las estatinas [employer] niveles de [place], lo que puede reducir el riesgo de Marine scientist una enfermedad cardaca. Siga estas indicaciones en su casa: Comida y bebida  Si se lo indic el mdico:  Coma pollo (sin piel), pescado, ternera, mariscos, pechuga de [place] y cortes de carne roja de pulpa o de lomo.  No coma alimentos fritos ni carnes grasosas, como salchichas y salame.  Coma muchas frutas, como manzanas.  Coma gran cantidad de verduras, como brcoli, papas y zanahorias.  Coma porotos, guisantes secos y lentejas.  Coma cereales, como cebada, arroz, cuscs y trigo burgol.  Coma pastas sin salsas con crema.  Tome [employer] o semidescremada, y coma yogures y quesos descremados o semidescremados.  No coma ni beba [employer], crema, helado, yemas de huevo ni quesos duros.  No coma margarinas en barra ni untables que contengan grasas trans (que tambin se conocen como aceites parcialmente hidrogenados).  No coma aceites tropicales saturados, como el de coco y el de [place].  No coma tortas, galletas, galletitas ni otros productos horneados que contengan grasas trans.  Instrucciones generales  Haga ejercicio segn las indicaciones del mdico. Aumente la cantidad de ejercicio fsico que realiza mediante actividades como jardinera, salir a Advertising account planner o usar las escaleras.  Tome los medicamentos de venta libre y los recetados  solamente como se lo haya indicado el mdico.  No consuma ningn producto que contenga nicotina o tabaco, como cigarrillos y Administrator, Civil Service. Si necesita ayuda para dejar de fumar, consulte al mdico.  Concurra a todas las visitas de control como se lo haya indicado el mdico. Esto es importante. Comunquese con un mdico si:  Tiene dificultad para seguir una dieta sana o mantener un peso saludable.  Necesita ayuda para comenzar un programa de ejercicios.  Necesita ayuda para dejar de fumar. Solicite ayuda de inmediato si:  Midwife.  Tiene dificultad para respirar. Esta informacin no tiene Theme park manager el consejo del mdico. Asegrese de hacerle al mdico cualquier pregunta que tenga. Document Released: 12/18/2004 Document Revised: 03/26/2016 Document Reviewed: 06/18/2015 Elsevier Interactive Patient Education  [employer].

## 2017-11-29 ENCOUNTER — Telehealth: Payer: Self-pay | Admitting: Family Medicine

## 2017-11-29 NOTE — Telephone Encounter (Signed)
MyChart message sent to patient about rescheduling their appt on 01/24/18 °

## 2018-01-16 ENCOUNTER — Ambulatory Visit: Payer: BLUE CROSS/BLUE SHIELD

## 2018-01-24 ENCOUNTER — Ambulatory Visit: Payer: BLUE CROSS/BLUE SHIELD | Admitting: Family Medicine

## 2018-10-08 ENCOUNTER — Other Ambulatory Visit: Payer: Self-pay

## 2018-10-08 ENCOUNTER — Emergency Department (HOSPITAL_COMMUNITY): Payer: BLUE CROSS/BLUE SHIELD

## 2018-10-08 ENCOUNTER — Encounter (HOSPITAL_COMMUNITY): Payer: Self-pay | Admitting: Emergency Medicine

## 2018-10-08 ENCOUNTER — Emergency Department (HOSPITAL_COMMUNITY)
Admission: EM | Admit: 2018-10-08 | Discharge: 2018-10-09 | Disposition: A | Discharge status: Home / Self Care | Payer: BLUE CROSS/BLUE SHIELD | Attending: Emergency Medicine | Admitting: Emergency Medicine

## 2018-10-08 DIAGNOSIS — I10 Essential (primary) hypertension: Secondary | ICD-10-CM | POA: Insufficient documentation

## 2018-10-08 DIAGNOSIS — U071 COVID-19: Secondary | ICD-10-CM | POA: Diagnosis not present

## 2018-10-08 DIAGNOSIS — J069 Acute upper respiratory infection, unspecified: Secondary | ICD-10-CM | POA: Diagnosis not present

## 2018-10-08 DIAGNOSIS — R05 Cough: Secondary | ICD-10-CM | POA: Diagnosis present

## 2018-10-08 DIAGNOSIS — R509 Fever, unspecified: Secondary | ICD-10-CM | POA: Insufficient documentation

## 2018-10-08 MED ORDER — ACETAMINOPHEN 325 MG PO TABS
650.0000 mg | ORAL_TABLET | Freq: Once | ORAL | Status: AC
  Administered 2018-10-08: 650 mg via ORAL

## 2018-10-08 NOTE — ED Triage Notes (Signed)
Patient reports persistent productive cough with nasal congestion /blood tinged mucus and chest congestion onset Friday last week , patient is febrile at arrival .

## 2018-10-09 LAB — CBC WITH DIFFERENTIAL/PLATELET
Abs Immature Granulocytes: 0.03 10*3/uL (ref 0.00–0.07)
Basophils Absolute: 0 10*3/uL (ref 0.0–0.1)
Eosinophils Absolute: 0 10*3/uL (ref 0.0–0.5)
Hemoglobin: 16.3 g/dL (ref 13.0–17.0)
Immature Granulocytes: 0 %
Lymphs Abs: 1.4 10*3/uL (ref 0.7–4.0)
MCHC: 33.3 g/dL (ref 30.0–36.0)
MCV: 89.9 fL (ref 80.0–100.0)
Monocytes Absolute: 0.4 10*3/uL (ref 0.1–1.0)
Monocytes Relative: 6 %
Neutro Abs: 5.1 10*3/uL (ref 1.7–7.7)
Neutrophils Relative %: 74 %
Platelets: 168 10*3/uL (ref 150–400)
RBC: 5.45 MIL/uL (ref 4.22–5.81)
RDW: 13.1 % (ref 11.5–15.5)
WBC: 6.9 10*3/uL (ref 4.0–10.5)
nRBC: 0 % (ref 0.0–0.2)

## 2018-10-09 LAB — BASIC METABOLIC PANEL
BUN: 12 mg/dL (ref 6–20)
CO2: 22 mmol/L (ref 22–32)
Calcium: 8.4 mg/dL — ABNORMAL LOW (ref 8.9–10.3)
Chloride: 102 mmol/L (ref 98–111)
Creatinine, Ser: 0.86 mg/dL (ref 0.61–1.24)
GFR calc non Af Amer: >60 mL/min (ref >60)
Glucose, Bld: 171 mg/dL — ABNORMAL HIGH (ref 70–99)

## 2018-10-09 LAB — INFLUENZA PANEL BY PCR (TYPE A & B)
Influenza A By PCR: NEGATIVE
Influenza B By PCR: NEGATIVE

## 2018-10-09 MED ORDER — BENZONATATE 100 MG PO CAPS
100.0000 mg | ORAL_CAPSULE | Freq: Once | ORAL | Status: AC
  Administered 2018-10-09: 100 mg via ORAL
  Filled 2018-10-09: qty 1

## 2018-10-09 MED ORDER — IBUPROFEN 800 MG PO TABS
800.0000 mg | ORAL_TABLET | Freq: Once | ORAL | Status: AC
  Administered 2018-10-09: 800 mg via ORAL
  Filled 2018-10-09: qty 1

## 2018-10-09 MED ORDER — BENZONATATE 100 MG PO CAPS: 100.0000 mg | ORAL_CAPSULE | Freq: Three times a day (TID) | ORAL | 0 refills | Status: DC

## 2018-10-09 MED ORDER — ALUM & MAG HYDROXIDE-SIMETH 200-200-20 MG/5ML PO SUSP
30.0000 mL | Freq: Once | ORAL | Status: AC
  Administered 2018-10-09: 30 mL via ORAL
  Filled 2018-10-09: qty 30

## 2018-10-09 MED ORDER — LORATADINE 10 MG PO TABS: 10.0000 mg | ORAL_TABLET | Freq: Every day | ORAL | 0 refills | Status: DC

## 2018-10-09 MED ORDER — LIDOCAINE VISCOUS HCL 2 % MT SOLN
15.0000 mL | Freq: Once | OROMUCOSAL | Status: AC
  Administered 2018-10-09: 15 mL via ORAL
  Filled 2018-10-09: qty 15

## 2018-10-09 MED ORDER — IBUPROFEN 600 MG PO TABS: 600.0000 mg | ORAL_TABLET | Freq: Four times a day (QID) | ORAL | 0 refills | Status: DC | PRN

## 2018-10-09 NOTE — ED Provider Notes (Signed)
[facility] EMERGENCY DEPARTMENT Provider Note   CSN: [id number] Arrival date & time: 10/08/18  2247     History   Chief Complaint Chief Complaint  Patient presents with  . Cough    Fever    HPI Ethan Silva is a 53 y.o. male.    53 year old male with a history of dyslipidemia, hypertension, prediabetes presents to the ED for evaluation of upper respiratory symptoms x6 days.  States that his symptoms began with a cough.  His cough has remained persistent, forceful.  It is occasionally productive of blood-tinged mucus.  Symptoms further associated with nasal congestion and chest congestion.  Describes a discomfort in his central chest when coughing.  At times he has coughed so forcefully that it causes him to vomit.  Reports that it is causing symptoms of "heartburn".  He has also had a subjective fever and myalgias which he has been managing with Tylenol.  Additionally taking TheraFlu for symptoms without relief.  Wears a mask at work and is unsure of any sick contacts or known COVID exposures.  No associated SOB, DOE, syncope.  The history is provided by the patient. A language interpreter was used ([employer]).  Cough   Past Medical History:  Diagnosis Date  . Bile duct leak   . Chronic kidney disease 2017   left kidney stones  . Headache(784.0)    Hx: of when BP is elevated  . Hyperlipidemia   . Hypertension   . Numbness and tingling in hands    Hx: of  . Prediabetes     Patient Active Problem List   Diagnosis Date Noted  . BMI 30.0-30.9,adult 08/30/2015  . Hyperlipidemia 08/30/2015  . HTN (hypertension) 10/11/2012    Past Surgical History:  Procedure Laterality Date  . CHOLECYSTECTOMY N/A 07/11/2012   Procedure: LAPAROSCOPIC CHOLECYSTECTOMY WITH INTRAOPERATIVE CHOLANGIOGRAM;  Surgeon: Axel Filler, MD;  Location: [facility] OR;  Service: General;  Laterality: N/A;  . COLONOSCOPY    . ERCP  07/16/2012  . ERCP N/A 07/16/2012   Procedure:  ENDOSCOPIC RETROGRADE CHOLANGIOPANCREATOGRAPHY (ERCP);  Surgeon: Louis Meckel, MD;  Location: [facility] OR;  Service: Gastroenterology;  Laterality: N/A;  . ERCP N/A 10/06/2012   Procedure: ENDOSCOPIC RETROGRADE CHOLANGIOPANCREATOGRAPHY (ERCP);  Surgeon: Louis Meckel, MD;  Location: [facility];  Service: Endoscopy;  Laterality: N/A;        Home Medications    Prior to Admission medications   Medication Sig Start Date End Date Taking? Authorizing Provider  amLODipine (NORVASC) 10 MG tablet Take 1 tablet (10 mg total) by mouth daily. 10/21/17   Myles Lipps, MD  atorvastatin (LIPITOR) 40 MG tablet Take 1 tablet (40 mg total) by mouth daily. 10/21/17   Myles Lipps, MD  benzonatate (TESSALON) 100 MG capsule Take 1 capsule (100 mg total) by mouth every 8 (eight) hours. 10/09/18   Antony Madura, PA-C  ibuprofen (ADVIL) 600 MG tablet Take 1 tablet (600 mg total) by mouth every 6 (six) hours as needed for fever, mild pain or moderate pain. 10/09/18   Antony Madura, PA-C  lisinopril (PRINIVIL,ZESTRIL) 20 MG tablet Take 1 tablet (20 mg total) by mouth daily. 10/21/17   Myles Lipps, MD  loratadine (CLARITIN) 10 MG tablet Take 1 tablet (10 mg total) by mouth daily. 10/09/18   Antony Madura, PA-C    Family History Family History  Problem Relation Age of Onset  . Gallstones Mother   . Diabetes Paternal Uncle   . Gallstones Brother  Social History Social History   Tobacco Use  . Smoking status: Never Smoker  . Smokeless tobacco: Never Used  Substance Use Topics  . Alcohol use: No  . Drug use: No     Allergies   Patient has no known allergies.   Review of Systems Review of Systems  Respiratory: Positive for cough.   Ten systems reviewed and are negative for acute change, except as noted in the HPI.     Physical Exam Updated Vital Signs BP 122/80   Pulse 79   Temp 99.3 F (37.4 C) (Oral)   Resp 15   SpO2 93%   Physical Exam Vitals signs and nursing note  reviewed.  Constitutional:      General: He is not in acute distress.    Appearance: He is well-developed. He is ill-appearing. He is not diaphoretic.     Comments: Nontoxic and in NAD  HENT:     Head: Normocephalic and atraumatic.     Right Ear: External ear normal.     Left Ear: External ear normal.     Nose: Congestion (mild) present.     Comments: Dried blood in left nare. No active epistaxis.    Mouth/Throat:     Mouth: Mucous membranes are moist.     Comments: No posterior oropharyngeal exudates. Tolerating secretions. Normal phonation. Eyes:     General: No scleral icterus.    Conjunctiva/sclera: Conjunctivae normal.  Neck:     Musculoskeletal: Normal range of motion.  Cardiovascular:     Rate and Rhythm: Normal rate and regular rhythm.     Pulses: Normal pulses.  Pulmonary:     Effort: Pulmonary effort is normal. No respiratory distress.     Breath sounds: No stridor. No wheezing or rales.     Comments: Frequent forceful, nonproductive cough. Lungs grossly CTAB. Sats 92-95% on RA at rest. Musculoskeletal: Normal range of motion.  Skin:    General: Skin is warm and dry.     Coloration: Skin is not pale.     Findings: No erythema or rash.  Neurological:     General: No focal deficit present.     Mental Status: He is alert and oriented to person, place, and time.     Coordination: Coordination normal.     Comments: GCS 15. Moving all extremities spontaneously.  Psychiatric:        Behavior: Behavior normal.      ED Treatments / Results  Labs (all labs ordered are listed, but only abnormal results are displayed) Labs Reviewed  BASIC METABOLIC PANEL - Abnormal; Notable for the following components:      Result Value   Glucose, Bld 171 (*)    Calcium 8.4 (*)    All other components within normal limits  NOVEL CORONAVIRUS, NAA (HOSP ORDER, SEND-OUT TO REF LAB; TAT 18-24 HRS)  CBC WITH DIFFERENTIAL/PLATELET  INFLUENZA PANEL BY PCR (TYPE A & B)    EKG None   Radiology Dg Chest Portable 1 View  Result Date: 10/08/2018 CLINICAL DATA:  Cough and fever EXAM: PORTABLE CHEST 1 VIEW COMPARISON:  07/22/2017 FINDINGS: The heart size and mediastinal contours are within normal limits. Both lungs are clear. The visualized skeletal structures are unremarkable. IMPRESSION: No active disease. Electronically Signed   By: Deatra RobinsonKevin  Herman M.D.   On: 10/08/2018 23:27    Procedures Procedures (including critical care time)  Medications Ordered in ED Medications  acetaminophen (TYLENOL) tablet 650 mg (650 mg Oral Given 10/08/18 2301)  alum &  mag hydroxide-simeth (MAALOX/MYLANTA) 200-200-20 MG/5ML suspension 30 mL (30 mLs Oral Given 10/09/18 0352)    And  lidocaine (XYLOCAINE) 2 % viscous mouth solution 15 mL (15 mLs Oral Given 10/09/18 0352)  benzonatate (TESSALON) capsule 100 mg (100 mg Oral Given 10/09/18 0352)  ibuprofen (ADVIL) tablet 800 mg (800 mg Oral Given 10/09/18 0352)    4:52 AM Patient ambulated with sats of 95% on RA while in the ED. HR 83bpm. VSS.   Initial Impression / Assessment and Plan / ED Course  I have reviewed the triage vital signs and the nursing notes.  Pertinent labs & imaging results that were available during my care of the patient were reviewed by me and considered in my medical decision making (see chart for details).        Patient's symptoms are consistent with URI, likely viral etiology. CXR negative for acute infiltrate. Labs reassuring. Discussed that antibiotics are not indicated for viral infections. Patient will be discharged with symptomatic treatment.  He verbalizes understanding and is agreeable with plan.  Also instructed to quarantine until he receives the results of his COVID test.  A flu swab was also sent.  Return precautions discussed and provided. Patient discharged in stable condition with no unaddressed concerns.  Ethan Silva was evaluated in Emergency Department on 10/09/2018 for the symptoms described in  the history of present illness. He was evaluated in the context of the global COVID-19 pandemic, which necessitated consideration that the patient might be at risk for infection with the SARS-CoV-2 virus that causes COVID-19. Institutional protocols and algorithms that pertain to the evaluation of patients at risk for COVID-19 are in a state of rapid change based on information released by regulatory bodies including the CDC and federal and state organizations. These policies and algorithms were followed during the patient's care in the ED.   Final Clinical Impressions(s) / ED Diagnoses   Final diagnoses:  Viral URI with cough  Febrile illness    ED Discharge Orders         Ordered    benzonatate (TESSALON) 100 MG capsule  Every 8 hours     10/09/18 0503    ibuprofen (ADVIL) 600 MG tablet  Every 6 hours PRN     10/09/18 0503    loratadine (CLARITIN) 10 MG tablet  Daily     10/09/18 0503           Antonietta Breach, PA-C 10/09/18 0553    Ward, Delice Bison, DO 10/09/18 [id number]

## 2018-10-09 NOTE — Discharge Instructions (Signed)
Your symptoms are concerning for a viral illness. You have a COVID test pending which should result in 1-2 days. We recommend that you quarantine away from other people until you receive your test results. If  you test positive for COVID you should quarantine for an additional 10-14 days. Take Claritin for nasal congestion and Tessalon for cough. Continue use of ibuprofen for pain and body aches. Take Tylenol of fever. Follow up with a primary care doctor to ensure that your symptoms resolve.  If you experience new or concerning symptoms such as persistent vomiting, shortness of breath, loss of consciousness, return to the ED for repeat evaluation.  ----  Sus sntomas son preocupantes para una enfermedad viral. Tiene una prueba de COVID pendiente que debera resultar en 1-2 das. Le recomendamos que se ponga en cuarentena lejos de otras personas hasta que reciba los [place]. Si el resultado de la prueba de COVID es positivo, debe ponerlo en cuarentena durante 10 a [address]. Tome Claritin para la congestin nasal y Tessalon para la tos. Contine usando ibuprofeno para Conservation officer, historic buildings y los dolores corporales. Toma Tylenol de la fiebre. Haga un seguimiento con un mdico de atencin primaria para asegurarse de que sus sntomas se resuelvan.  Si experimenta sntomas nuevos o preocupantes, como vmitos persistentes, dificultad para respirar, prdida del conocimiento, regrese al servicio de urgencias para repetir la evaluacin.

## 2018-10-09 NOTE — ED Notes (Signed)
Pt.ambulated in room sats was 95% RA.heartrate is 83

## 2018-10-10 LAB — NOVEL CORONAVIRUS, NAA (HOSP ORDER, SEND-OUT TO REF LAB; TAT 18-24 HRS): SARS-CoV-2, NAA: DETECTED — AB

## 2019-02-26 ENCOUNTER — Emergency Department (HOSPITAL_COMMUNITY): Payer: 59

## 2019-02-26 ENCOUNTER — Other Ambulatory Visit: Payer: Self-pay

## 2019-02-26 ENCOUNTER — Emergency Department (HOSPITAL_COMMUNITY)
Admission: EM | Admit: 2019-02-26 | Discharge: 2019-02-27 | Disposition: A | Discharge status: Home / Self Care | Payer: 59 | Attending: Emergency Medicine | Admitting: Emergency Medicine

## 2019-02-26 ENCOUNTER — Encounter (HOSPITAL_COMMUNITY): Payer: Self-pay | Admitting: Emergency Medicine

## 2019-02-26 DIAGNOSIS — M25562 Pain in left knee: Secondary | ICD-10-CM | POA: Diagnosis not present

## 2019-02-26 DIAGNOSIS — R030 Elevated blood-pressure reading, without diagnosis of hypertension: Secondary | ICD-10-CM | POA: Insufficient documentation

## 2019-02-26 DIAGNOSIS — I10 Essential (primary) hypertension: Secondary | ICD-10-CM

## 2019-02-26 DIAGNOSIS — K1379 Other lesions of oral mucosa: Secondary | ICD-10-CM | POA: Diagnosis not present

## 2019-02-26 DIAGNOSIS — Z79899 Other long term (current) drug therapy: Secondary | ICD-10-CM | POA: Diagnosis not present

## 2019-02-26 DIAGNOSIS — R0602 Shortness of breath: Secondary | ICD-10-CM | POA: Diagnosis present

## 2019-02-26 LAB — CBC WITH DIFFERENTIAL/PLATELET
Abs Immature Granulocytes: 0.03 10*3/uL (ref 0.00–0.07)
Basophils Absolute: 0.1 10*3/uL (ref 0.0–0.1)
Basophils Relative: 1 %
Eosinophils Absolute: 0.2 10*3/uL (ref 0.0–0.5)
Eosinophils Relative: 2 %
HCT: 50.3 % (ref 39.0–52.0)
Hemoglobin: 17.1 g/dL — ABNORMAL HIGH (ref 13.0–17.0)
Immature Granulocytes: 0 %
Lymphocytes Relative: 31 %
Lymphs Abs: 2.3 10*3/uL (ref 0.7–4.0)
MCH: 30.1 pg (ref 26.0–34.0)
MCHC: 34 g/dL (ref 30.0–36.0)
MCV: 88.6 fL (ref 80.0–100.0)
Monocytes Absolute: 0.6 10*3/uL (ref 0.1–1.0)
Monocytes Relative: 8 %
Neutro Abs: 4.4 10*3/uL (ref 1.7–7.7)
Neutrophils Relative %: 58 %
Platelets: 235 10*3/uL (ref 150–400)
RBC: 5.68 MIL/uL (ref 4.22–5.81)
RDW: 13 % (ref 11.5–15.5)
WBC: 7.6 10*3/uL (ref 4.0–10.5)
nRBC: 0 % (ref 0.0–0.2)

## 2019-02-26 NOTE — ED Triage Notes (Addendum)
Patient arrived with EMS from home reports SOB this evening with throat tightness/gastric reflux , denies emesis or diaphoresis , symptoms resolved at arrival . No cough or fever . Spanish interpreter service utilized at triage .

## 2019-02-27 LAB — COMPREHENSIVE METABOLIC PANEL
ALT: 45 U/L — ABNORMAL HIGH (ref 0–44)
AST: 37 U/L (ref 15–41)
Albumin: 4.1 g/dL (ref 3.5–5.0)
Alkaline Phosphatase: 43 U/L (ref 38–126)
Anion gap: 12 (ref 5–15)
BUN: 17 mg/dL (ref 6–20)
CO2: 26 mmol/L (ref 22–32)
Calcium: 9.4 mg/dL (ref 8.9–10.3)
Chloride: 102 mmol/L (ref 98–111)
Creatinine, Ser: 0.9 mg/dL (ref 0.61–1.24)
GFR calc Af Amer: >60 mL/min (ref >60)
GFR calc non Af Amer: >60 mL/min (ref >60)
Glucose, Bld: 148 mg/dL — ABNORMAL HIGH (ref 70–99)
Potassium: 3.5 mmol/L (ref 3.5–5.1)
Sodium: 140 mmol/L (ref 135–145)
Total Bilirubin: 0.9 mg/dL (ref 0.3–1.2)
Total Protein: 7.3 g/dL (ref 6.5–8.1)

## 2019-02-27 MED ORDER — PREDNISONE 10 MG PO TABS: 20.0000 mg | ORAL_TABLET | Freq: Every day | ORAL | 0 refills | Status: DC

## 2019-02-27 MED ORDER — PREDNISONE 20 MG PO TABS
60.0000 mg | ORAL_TABLET | Freq: Once | ORAL | Status: AC
  Administered 2019-02-27: 60 mg via ORAL
  Filled 2019-02-27: qty 3

## 2019-02-27 NOTE — ED Provider Notes (Signed)
[facility] EMERGENCY DEPARTMENT Provider Note   CSN: [id number] Arrival date & time: 02/26/19  2253   History Chief Complaint  Patient presents with  . Shortness of Breath    Throat Tightness    Ethan Silva is a 54 y.o. male.  The history is provided by the patient.  Shortness of Breath He has history of hypertension, hyperlipidemia, prediabetes and comes in after having an episode where he felt like something in his throat was swollen and he was having difficulty breathing.  That episode lasted about 30 minutes before resolving.  He has had similar episodes in the past.  He does not recall eating anything unusual and denies any unusual exposures.  He feels like he is back to normal.  A second complaint is pain in his left knee.  It bothers him most at night and when he gets home from work in the evening.  He has been taking acetaminophen for it without relief.  Knee pain is been bothering him for the last 2 weeks.  Past Medical History:  Diagnosis Date  . Bile duct leak   . Chronic kidney disease 2017   left kidney stones  . Headache(784.0)    Hx: of when BP is elevated  . Hyperlipidemia   . Hypertension   . Numbness and tingling in hands    Hx: of  . Prediabetes     Patient Active Problem List   Diagnosis Date Noted  . BMI 30.0-30.9,adult 08/30/2015  . Hyperlipidemia 08/30/2015  . HTN (hypertension) 10/11/2012    Past Surgical History:  Procedure Laterality Date  . CHOLECYSTECTOMY N/A 07/11/2012   Procedure: LAPAROSCOPIC CHOLECYSTECTOMY WITH INTRAOPERATIVE CHOLANGIOGRAM;  Surgeon: Axel Filler, MD;  Location: [facility] OR;  Service: General;  Laterality: N/A;  . COLONOSCOPY    . ERCP  07/16/2012  . ERCP N/A 07/16/2012   Procedure: ENDOSCOPIC RETROGRADE CHOLANGIOPANCREATOGRAPHY (ERCP);  Surgeon: Louis Meckel, MD;  Location: [facility] OR;  Service: Gastroenterology;  Laterality: N/A;  . ERCP N/A 10/06/2012   Procedure: ENDOSCOPIC RETROGRADE  CHOLANGIOPANCREATOGRAPHY (ERCP);  Surgeon: Louis Meckel, MD;  Location: [facility];  Service: Endoscopy;  Laterality: N/A;       Family History  Problem Relation Age of Onset  . Gallstones Mother   . Diabetes Paternal Uncle   . Gallstones Brother     Social History   Tobacco Use  . Smoking status: Never Smoker  . Smokeless tobacco: Never Used  Substance Use Topics  . Alcohol use: No  . Drug use: No    Home Medications Prior to Admission medications   Medication Sig Start Date End Date Taking? Authorizing Provider  amLODipine (NORVASC) 10 MG tablet Take 1 tablet (10 mg total) by mouth daily. 10/21/17   Myles Lipps, MD  atorvastatin (LIPITOR) 40 MG tablet Take 1 tablet (40 mg total) by mouth daily. 10/21/17   Myles Lipps, MD  benzonatate (TESSALON) 100 MG capsule Take 1 capsule (100 mg total) by mouth every 8 (eight) hours. 10/09/18   Antony Madura, PA-C  ibuprofen (ADVIL) 600 MG tablet Take 1 tablet (600 mg total) by mouth every 6 (six) hours as needed for fever, mild pain or moderate pain. 10/09/18   Antony Madura, PA-C  lisinopril (PRINIVIL,ZESTRIL) 20 MG tablet Take 1 tablet (20 mg total) by mouth daily. 10/21/17   Myles Lipps, MD  loratadine (CLARITIN) 10 MG tablet Take 1 tablet (10 mg total) by mouth daily. 10/09/18   Antony Madura, PA-C  Allergies    Patient has no known allergies.  Review of Systems   Review of Systems  Respiratory: Positive for shortness of breath.   All other systems reviewed and are negative.   Physical Exam Updated Vital Signs BP (!) 146/100   Pulse 60   Temp 98.6 F (37 C) (Oral)   Resp 19   Ht 5\' 3"  (1.6 m)   Wt 75 kg   SpO2 94%   BMI 29.29 kg/m   Physical Exam Vitals and nursing note reviewed.   54 year old male, resting comfortably and in no acute distress. Vital signs are significant for elevated blood pressure. Oxygen saturation is 94%, which is normal. Head is normocephalic and atraumatic. PERRLA, EOMI.  Oropharynx is clear.  There is mild edema of the uvula but no pooling of secretions.  Phonation is normal. Neck is nontender and supple without adenopathy or JVD. Back is nontender and there is no CVA tenderness. Lungs are clear without rales, wheezes, or rhonchi. Chest is nontender. Heart has regular rate and rhythm without murmur. Abdomen is soft, flat, nontender without masses or hepatosplenomegaly and peristalsis is normoactive. Extremities have no cyanosis or edema, full range of motion is present.  There is tenderness to palpation in the popliteal space with slight swelling suggestive of a Baker's cyst.  There is no other area of tenderness in the left knee.  There is no instability. Skin is warm and dry without rash. Neurologic: Mental status is normal, cranial nerves are intact, there are no motor or sensory deficits.  ED Results / Procedures / Treatments   Labs (all labs ordered are listed, but only abnormal results are displayed) Labs Reviewed  CBC WITH DIFFERENTIAL/PLATELET - Abnormal; Notable for the following components:      Result Value   Hemoglobin 17.1 (*)    All other components within normal limits  COMPREHENSIVE METABOLIC PANEL - Abnormal; Notable for the following components:   Glucose, Bld 148 (*)    ALT 45 (*)    All other components within normal limits    EKG EKG Interpretation  Date/Time:  Thursday February 26 2019 22:56:24 EST Ventricular Rate:  68 PR Interval:  150 QRS Duration: 98 QT Interval:  410 QTC Calculation: 435 R Axis:   81 Text Interpretation: Normal sinus rhythm Incomplete right bundle branch block Borderline ECG When compared with ECG of 07/22/2017, No significant change was found Confirmed by 07/24/2017 (Dione Booze) on 02/26/2019 11:37:43 PM   Radiology DG Chest 2 View  Result Date: 02/26/2019 CLINICAL DATA:  Shortness of breath EXAM: CHEST - 2 VIEW COMPARISON:  10/08/2018 FINDINGS: Low lung volumes. No consolidation or effusion. Borderline  cardiomegaly. No pneumothorax. IMPRESSION: No active cardiopulmonary disease. Low lung volumes with hypoventilatory changes. Electronically Signed   By: 12/08/2018 M.D.   On: 02/26/2019 23:26    Procedures Procedures   Medications Ordered in ED Medications  predniSONE (DELTASONE) tablet 60 mg (has no administration in time range)    ED Course  I have reviewed the triage vital signs and the nursing notes.  Pertinent labs & imaging results that were available during my care of the patient were reviewed by me and considered in my medical decision making (see chart for details).  MDM Rules/Calculators/A&P Swelling of the uvula which appears to be allergic.  He is given a dose of prednisone.  Symptoms have resolved for several hours so he is felt to be safe for discharge at this point.  Left knee pain  which appears to be Baker's cyst versus osteoarthritis.  Patient is advised to use over-the-counter NSAIDs rather than acetaminophen.  He is referred to orthopedics for follow-up.  Lab work was unremarkable.  Chest x-ray was unremarkable.  ECG was unchanged from prior.  Final Clinical Impression(s) / ED Diagnoses Final diagnoses:  Uvular swelling  Pain in joint of left knee  Elevated blood pressure reading with diagnosis of hypertension    Rx / DC Orders ED Discharge Orders         Ordered    predniSONE (DELTASONE) 10 MG tablet  Daily     02/27/19 5830           Delora Fuel, MD 94/07/68 0422

## 2019-02-27 NOTE — ED Notes (Signed)
Pt expressed pain in the back of the left knee; this RN supplied support under knee to help alleviate pressure. Will continue to monitor

## 2019-02-27 NOTE — Discharge Instructions (Signed)
Intente tomar naproxeno o ibuprofeno para el dolor de rodilla. Tome una dosis antes de [place].  Si tiene esa sensacin de hinchazn en la garganta, intente tomar difenhidramina. Sin embargo, si tiene problemas para Industrial/product designer, acuda al Departamento de Emergencias de inmediato.

## 2019-03-16 ENCOUNTER — Other Ambulatory Visit: Payer: Self-pay

## 2019-03-16 ENCOUNTER — Ambulatory Visit: Payer: Self-pay | Admitting: Medical

## 2019-03-19 ENCOUNTER — Other Ambulatory Visit: Payer: Self-pay

## 2019-03-19 ENCOUNTER — Ambulatory Visit (INDEPENDENT_AMBULATORY_CARE_PROVIDER_SITE_OTHER): Payer: 59

## 2019-03-19 ENCOUNTER — Ambulatory Visit (INDEPENDENT_AMBULATORY_CARE_PROVIDER_SITE_OTHER): Payer: 59 | Admitting: Family Medicine

## 2019-03-19 ENCOUNTER — Encounter: Payer: Self-pay | Admitting: Family Medicine

## 2019-03-19 VITALS — BP 132/62 | HR 53 | Temp 98.3°F | Ht 63.0 in | Wt 161.0 lb

## 2019-03-19 DIAGNOSIS — I10 Essential (primary) hypertension: Secondary | ICD-10-CM | POA: Diagnosis not present

## 2019-03-19 DIAGNOSIS — R350 Frequency of micturition: Secondary | ICD-10-CM

## 2019-03-19 DIAGNOSIS — R3129 Other microscopic hematuria: Secondary | ICD-10-CM | POA: Diagnosis not present

## 2019-03-19 DIAGNOSIS — K219 Gastro-esophageal reflux disease without esophagitis: Secondary | ICD-10-CM

## 2019-03-19 DIAGNOSIS — Z125 Encounter for screening for malignant neoplasm of prostate: Secondary | ICD-10-CM

## 2019-03-19 DIAGNOSIS — E782 Mixed hyperlipidemia: Secondary | ICD-10-CM

## 2019-03-19 LAB — POCT URINALYSIS DIP (MANUAL ENTRY)
Bilirubin, UA: NEGATIVE
Glucose, UA: NEGATIVE mg/dL
Ketones, POC UA: NEGATIVE mg/dL
Leukocytes, UA: NEGATIVE
Nitrite, UA: NEGATIVE
Spec Grav, UA: >=1.03 — AB (ref 1.010–1.025)
Urobilinogen, UA: 0.2 E.U./dL
pH, UA: 6 (ref 5.0–8.0)

## 2019-03-19 LAB — POC MICROSCOPIC URINALYSIS (UMFC): Mucus: ABSENT

## 2019-03-19 MED ORDER — ATORVASTATIN CALCIUM 40 MG PO TABS: 40.0000 mg | ORAL_TABLET | Freq: Every day | ORAL | 3 refills | Status: DC

## 2019-03-19 MED ORDER — LISINOPRIL 20 MG PO TABS: 20.0000 mg | ORAL_TABLET | Freq: Every day | ORAL | 3 refills | Status: DC

## 2019-03-19 MED ORDER — OMEPRAZOLE 20 MG PO CPDR: 20.0000 mg | DELAYED_RELEASE_CAPSULE | Freq: Every day | ORAL | 3 refills | Status: DC

## 2019-03-19 NOTE — Progress Notes (Signed)
3/18/202111:16 AM  Ethan Silva 1965-08-06, 54 y.o., male [id number]  Chief Complaint  Patient presents with  . Urinary Frequency    daytime urgency and hesitency, night time urination. request psa check  . Medication Refill    HPI:   Patient is a 54 y.o. male with past medical history significant for HTN, HLP and pre-diabetes  who presents today for routine followup  Last OV Oct 2020 - added lisinopril, started atorvastatin He stopped taking amlodipine  Tolerating new meds well Non smoker 2 months of intermittent urinary frequency and urgency, no incontinence, weak stream, nocturia x 2-3, dysuria Denies hematuria or penile discharge h/o kidney stones No problems with BM Checks BP at home, reports usually at goal Does not check blood glucose Having problems with reflux     Depression screen [facility] 2/9 03/19/2019 10/21/2017 09/20/2017  Decreased Interest 0 0 0  Down, Depressed, Hopeless 0 0 0  PHQ - 2 Score 0 0 0    Fall Risk  03/19/2019 10/21/2017 09/20/2017 02/22/2017 01/23/2017  Falls in the past year? 0 No No No No  Number falls in past yr: 0 - - - -  Injury with Fall? 0 - - - -  Follow up Falls evaluation completed - - - -     No Known Allergies  Prior to Admission medications   Medication Sig Start Date End Date Taking? Authorizing Provider  acetaminophen (TYLENOL) 325 MG tablet Take 650 mg by mouth every 6 (six) hours as needed.   Yes [provider]  lisinopril (PRINIVIL,ZESTRIL) 20 MG tablet Take 1 tablet (20 mg total) by mouth daily. 10/21/17  Yes Rutherford Guys, MD  amLODipine (NORVASC) 10 MG tablet Take 1 tablet (10 mg total) by mouth daily. Patient not taking: Reported on 03/19/2019 10/21/17   Rutherford Guys, MD  atorvastatin (LIPITOR) 40 MG tablet Take 1 tablet (40 mg total) by mouth daily. Patient not taking: Reported on 03/19/2019 10/21/17   Rutherford Guys, MD  [facility] 15 MG tablet Take 1 tablet by mouth once a day  take daily for two  weeks, and then only as needed afterwards. 03/02/19   [provider]    Past Medical History:  Diagnosis Date  . Bile duct leak   . Chronic kidney disease 2017   left kidney stones  . Headache(784.0)    Hx: of when BP is elevated  . Hyperlipidemia   . Hypertension   . Numbness and tingling in hands    Hx: of  . Prediabetes     Past Surgical History:  Procedure Laterality Date  . CHOLECYSTECTOMY N/A 07/11/2012   Procedure: LAPAROSCOPIC CHOLECYSTECTOMY WITH INTRAOPERATIVE CHOLANGIOGRAM;  Surgeon: Ralene Ok, MD;  Location: [place];  Service: General;  Laterality: N/A;  . COLONOSCOPY    . ERCP  07/16/2012  . ERCP N/A 07/16/2012   Procedure: ENDOSCOPIC RETROGRADE CHOLANGIOPANCREATOGRAPHY (ERCP);  Surgeon: Inda Castle, MD;  Location: [place];  Service: Gastroenterology;  Laterality: N/A;  . ERCP N/A 10/06/2012   Procedure: ENDOSCOPIC RETROGRADE CHOLANGIOPANCREATOGRAPHY (ERCP);  Surgeon: Inda Castle, MD;  Location: [facility];  Service: Endoscopy;  Laterality: N/A;    Social History   Tobacco Use  . Smoking status: Never Smoker  . Smokeless tobacco: Never Used  Substance Use Topics  . Alcohol use: No    Family History  Problem Relation Age of Onset  . Gallstones Mother   . Diabetes Paternal Uncle   . Gallstones Brother  ROS   OBJECTIVE:  Today's Vitals   03/19/19 1041 03/19/19 1049  BP: (!) 161/98 132/62  Pulse: (!) 53   Temp: 98.3 F (36.8 C)   TempSrc: Temporal   SpO2: 97%   Weight: 161 lb (73 kg)   Height: 5\' 3"  (1.6 m)    Body mass index is 28.52 kg/m.   Physical Exam  Results for orders placed or performed in visit on 03/19/19 (from the past 24 hour(s))  POCT Microscopic Urinalysis ([facility])     Status: Abnormal   Collection Time: 03/19/19 11:11 AM  Result Value Ref Range   WBC,UR,HPF,POC None None WBC/hpf   RBC,UR,HPF,POC Moderate (A) None RBC/hpf   Bacteria None None, Too numerous to count   Mucus Absent Absent   Epithelial  Cells, UR Per Microscopy Few (A) None, Too numerous to count cells/hpf  POCT urinalysis dipstick     Status: Abnormal   Collection Time: 03/19/19 11:22 AM  Result Value Ref Range   Color, UA yellow yellow   Clarity, UA clear clear   Glucose, UA negative negative mg/dL   Bilirubin, UA negative negative   Ketones, POC UA negative negative mg/dL   Spec Grav, UA 03/21/19 (A) 1.010 - 1.025   Blood, UA moderate (A) negative   pH, UA 6.0 5.0 - 8.0   Protein Ur, POC trace (A) negative mg/dL   Urobilinogen, UA 0.2 0.2 or 1.0 E.U./dL   Nitrite, UA Negative Negative   Leukocytes, UA Negative Negative    DG Abd 2 Views  Result Date: 03/19/2019 CLINICAL DATA:  54 year old male with hematuria. EXAM: ABDOMEN - 2 VIEW COMPARISON:  CT Abdomen and Pelvis 01/21/2017. Abdominal radiographs 11/07/2015 and earlier. FINDINGS: Stable cholecystectomy clips. Negative visible lung bases. Non obstructed bowel gas pattern. No urinary calculus identified radiographically. Stable right hemipelvis phleboliths since 2017. No acute osseous abnormality identified. IMPRESSION: No urinary calculus or acute finding identified radiographically. Electronically Signed   By: 2018 M.D.   On: 03/19/2019 11:48     ASSESSMENT and PLAN  1. Frequent urination 2. Other microscopic hematuria Xray normal. Ur culture pending, no bacteria seen on microscopy. Referring to urology for further eval and treatment. - POCT Microscopic Urinalysis ([facility]) - Hemoglobin A1c - POCT urinalysis dipstick - Urine Culture - DG Abd 2 Views - Ambulatory referral to Urology  3. Screening for prostate cancer - PSA  4. Essential hypertension Controlled. Continue current regime.  - Comprehensive metabolic panel - lisinopril (ZESTRIL) 20 MG tablet; Take 1 tablet (20 mg total) by mouth daily.  5. Mixed hyperlipidemia Checking labs today, medications will be adjusted as needed.  - Lipid panel - atorvastatin (LIPITOR) 40 MG tablet; Take 1 tablet  (40 mg total) by mouth daily.  6. Gastroesophageal reflux disease without esophagitis Starting omeprazole.  - omeprazole (PRILOSEC) 20 MG capsule; Take 1 capsule (20 mg total) by mouth daily.  Return in about 6 months (around 09/19/2019).  09/21/2019, MD [facility] [address] [place], [place] Ph.  [phone] Fax [phone]

## 2019-03-19 NOTE — Progress Notes (Signed)
deloresdelor

## 2019-03-20 LAB — COMPREHENSIVE METABOLIC PANEL
ALT: 52 IU/L — ABNORMAL HIGH (ref 0–44)
AST: 31 IU/L (ref 0–40)
Albumin/Globulin Ratio: 1.8 (ref 1.2–2.2)
Albumin: 4.4 g/dL (ref 3.8–4.9)
Alkaline Phosphatase: 48 IU/L (ref 39–117)
BUN/Creatinine Ratio: 19 (ref 9–20)
BUN: 15 mg/dL (ref 6–24)
Bilirubin Total: 0.4 mg/dL (ref 0.0–1.2)
CO2: 23 mmol/L (ref 20–29)
Calcium: 9.2 mg/dL (ref 8.7–10.2)
Chloride: 104 mmol/L (ref 96–106)
Creatinine, Ser: 0.77 mg/dL (ref 0.76–1.27)
GFR calc Af Amer: 118 mL/min/{1.73_m2} (ref >59)
GFR calc non Af Amer: 102 mL/min/{1.73_m2} (ref >59)
Globulin, Total: 2.5 g/dL (ref 1.5–4.5)
Glucose: 89 mg/dL (ref 65–99)
Potassium: 4.1 mmol/L (ref 3.5–5.2)
Sodium: 142 mmol/L (ref 134–144)
Total Protein: 6.9 g/dL (ref 6.0–8.5)

## 2019-03-20 LAB — URINE CULTURE

## 2019-03-20 LAB — PSA: Prostate Specific Ag, Serum: 1.3 ng/mL (ref 0.0–4.0)

## 2019-03-20 LAB — LIPID PANEL
Chol/HDL Ratio: 4.2 ratio (ref 0.0–5.0)
Cholesterol, Total: 191 mg/dL (ref 100–199)
HDL: 46 mg/dL (ref >39)
LDL Chol Calc (NIH): 122 mg/dL — ABNORMAL HIGH (ref 0–99)
Triglycerides: 128 mg/dL (ref 0–149)
VLDL Cholesterol Cal: 23 mg/dL (ref 5–40)

## 2019-03-20 LAB — HEMOGLOBIN A1C
Est. average glucose Bld gHb Est-mCnc: 126 mg/dL
Hgb A1c MFr Bld: 6 % — ABNORMAL HIGH (ref 4.8–5.6)

## 2019-03-20 NOTE — Progress Notes (Signed)
Called pt with interpretor [phone], left message for him to call back for his lab results

## 2019-04-17 ENCOUNTER — Ambulatory Visit: Payer: 59 | Attending: Internal Medicine

## 2019-04-17 DIAGNOSIS — Z23 Encounter for immunization: Secondary | ICD-10-CM

## 2019-04-17 NOTE — Progress Notes (Signed)
   Covid-19 Vaccination Clinic  Name:  Shamell Suarez    MRN: [id number] DOB: Apr 18, 1965  04/17/2019  Mr. Fuston was observed post Covid-19 immunization for 15 minutes without incident. He was provided with Vaccine Information Sheet and instruction to access the V-Safe system.   Mr. Lothrop was instructed to call 911 with any severe reactions post vaccine: Marland Kitchen Difficulty breathing  . Swelling of face and throat  . A fast heartbeat  . A bad rash all over body  . Dizziness and weakness   Immunizations Administered    Name Date Dose VIS Date Route   Pfizer COVID-19 Vaccine 04/17/2019  8:38 AM 0.3 mL 12/12/2018 Intramuscular   Manufacturer: [employer], [employer]   Lot: [id number]   NDC: [address]-9

## 2019-04-20 ENCOUNTER — Other Ambulatory Visit: Payer: Self-pay

## 2019-04-20 ENCOUNTER — Ambulatory Visit (INDEPENDENT_AMBULATORY_CARE_PROVIDER_SITE_OTHER): Payer: 59 | Admitting: Emergency Medicine

## 2019-04-20 ENCOUNTER — Encounter: Payer: Self-pay | Admitting: Emergency Medicine

## 2019-04-20 VITALS — BP 152/92 | HR 62 | Temp 98.4°F | Ht 63.0 in | Wt 162.4 lb

## 2019-04-20 DIAGNOSIS — Z0000 Encounter for general adult medical examination without abnormal findings: Secondary | ICD-10-CM

## 2019-04-20 DIAGNOSIS — I10 Essential (primary) hypertension: Secondary | ICD-10-CM

## 2019-04-20 DIAGNOSIS — Z0001 Encounter for general adult medical examination with abnormal findings: Secondary | ICD-10-CM

## 2019-04-20 DIAGNOSIS — E785 Hyperlipidemia, unspecified: Secondary | ICD-10-CM

## 2019-04-20 DIAGNOSIS — R7303 Prediabetes: Secondary | ICD-10-CM | POA: Diagnosis not present

## 2019-04-20 NOTE — Patient Instructions (Addendum)
   If you have lab work done today you will be contacted with your lab results within the next 2 weeks.  If you have not heard from us then please contact us. The fastest way to get your results is to register for My Chart.   IF you received an x-ray today, you will receive an invoice from [facility]. Please contact [facility] at [phone] with questions or concerns regarding your invoice.   IF you received labwork today, you will receive an invoice from [facility]. Please contact [facility] at [phone] with questions or concerns regarding your invoice.   Our billing staff will not be able to assist you with questions regarding bills from these companies.  You will be contacted with the lab results as soon as they are available. The fastest way to get your results is to activate your My Chart account. Instructions are located on the last page of this paperwork. If you have not heard from us regarding the results in 2 weeks, please contact this office.       Mantenimiento de la salud en los hombres Health Maintenance, Male Adoptar un estilo de vida saludable y recibir atencin preventiva son importantes para promover la salud y el bienestar. Consulte al mdico sobre:  El esquema adecuado para hacerse pruebas y exmenes peridicos.  Cosas que puede hacer por su cuenta para prevenir enfermedades y mantenerse sano. Qu debo saber sobre la dieta, el peso y el ejercicio? Consuma una dieta saludable   Consuma una dieta que incluya muchas verduras, frutas, productos lcteos con bajo contenido de grasa y protenas magras.  No consuma muchos alimentos ricos en grasas slidas, azcares agregados o sodio. Mantenga un peso saludable El ndice de masa muscular (IMC) es una medida que puede utilizarse para identificar posibles problemas de peso. Proporciona una estimacin de la grasa corporal basndose en el peso y la altura. Su mdico puede ayudarle a determinar su IMC y  a lograr o mantener un peso saludable. Haga ejercicio con regularidad Haga ejercicio con regularidad. Esta es una de las prcticas ms importantes que puede hacer por su salud. La mayora de los adultos deben seguir estas pautas:  Realizar, al menos, 150minutos de actividad fsica por semana. El ejercicio debe aumentar la frecuencia cardaca y hacerlo transpirar (ejercicio de intensidad moderada).  Hacer ejercicios de fortalecimiento por lo menos dos veces por semana. Agregue esto a su plan de ejercicio de intensidad moderada.  Pasar menos tiempo sentados. Incluso la actividad fsica ligera puede ser beneficiosa. Controle sus niveles de colesterol y lpidos en la sangre Comience a realizarse anlisis de lpidos y colesterol en la sangre a los 20aos y luego reptalos cada 5aos. Es posible que necesite controlar los niveles de colesterol con mayor frecuencia si:  Sus niveles de lpidos y colesterol son altos.  Es mayor de 40aos.  Presenta un alto riesgo de padecer enfermedades cardacas. Qu debo saber sobre las pruebas de deteccin del cncer? Muchos tipos de cncer pueden detectarse de manera temprana y, a menudo, pueden prevenirse. Segn su historia clnica y sus antecedentes familiares, es posible que deba realizarse pruebas de deteccin del cncer en diferentes edades. Esto puede incluir pruebas de deteccin de lo siguiente:  Cncer colorrectal.  Cncer de prstata.  Cncer de piel.  Cncer de pulmn. Qu debo saber sobre la enfermedad cardaca, la diabetes y la hipertensin arterial? Presin arterial y enfermedad cardaca  La hipertensin arterial causa enfermedades cardacas y aumenta el riesgo de accidente cerebrovascular. Es ms   probable que esto se manifieste en las personas que tienen lecturas de presin arterial alta, tienen ascendencia africana o tienen sobrepeso.  Hable con el mdico sobre sus valores de presin arterial deseados.  Hgase controlar la presin  arterial: ? Cada 3 a 5 aos si tiene entre 18 y 39 aos. ? Todos los aos si es mayor de 40aos.  Si tiene entre 65 y 75 aos y es fumador o sola fumar, pregntele al mdico si debe realizarse una prueba de deteccin de aneurisma artico abdominal (AAA) por nica vez. Diabetes Realcese exmenes de deteccin de la diabetes con regularidad. Este anlisis revisa el nivel de azcar en la sangre en ayunas. Hgase las pruebas de deteccin:  Cada tresaos despus de los 45aos de edad si tiene un peso normal y un bajo riesgo de padecer diabetes.  Con ms frecuencia y a partir de una edad inferior si tiene sobrepeso o un alto riesgo de padecer diabetes. Qu debo saber sobre la prevencin de infecciones? Hepatitis B Si tiene un riesgo ms alto de contraer hepatitis B, debe someterse a un examen de deteccin de este virus. Hable con el mdico para averiguar si tiene riesgo de contraer la infeccin por hepatitis B. Hepatitis C Se recomienda un anlisis de sangre para:  Todos los que nacieron entre 1945 y 1965.  Todas las personas que tengan un riesgo de haber contrado hepatitis C. Enfermedades de transmisin sexual (ETS)  Debe realizarse pruebas de deteccin de ITS todos los aos, incluidas la gonorrea y la clamidia, si: ? Es sexualmente activo y es menor de 24aos. ? Es mayor de 24aos, y el mdico le informa que corre riesgo de tener este tipo de infecciones. ? La actividad sexual ha cambiado desde que le hicieron la ltima prueba de deteccin y tiene un riesgo mayor de tener clamidia o gonorrea. Pregntele al mdico si usted tiene riesgo.  Pregntele al mdico si usted tiene un alto riesgo de contraer VIH. El mdico tambin puede recomendarle un medicamento recetado para ayudar a evitar la infeccin por el VIH. Si elige tomar medicamentos para prevenir el VIH, primero debe hacerse los anlisis de deteccin del VIH. Luego debe hacerse anlisis cada 3meses mientras est tomando los  medicamentos. Siga estas instrucciones en su casa: Estilo de vida  No consuma ningn producto que contenga nicotina o tabaco, como cigarrillos, cigarrillos electrnicos y tabaco de mascar. Si necesita ayuda para dejar de fumar, consulte al mdico.  No consuma drogas.  No comparta agujas.  Solicite ayuda a su mdico si necesita apoyo o informacin para abandonar las drogas. Consumo de alcohol  No beba alcohol si el mdico se lo prohbe.  Si bebe alcohol: ? Limite la cantidad que consume de 0 a 2 medidas por da. ? Est atento a la cantidad de alcohol que hay en las bebidas que toma. En los [place], una medida equivale a una botella de cerveza de 12oz (355ml), un vaso de vino de 5oz (148ml) o un vaso de una bebida alcohlica de alta graduacin de 1oz (44ml). Instrucciones generales  Realcese los estudios de rutina de la salud, dentales y de la vista.  Mantngase al da con las vacunas.  Infrmele a su mdico si: ? Se siente deprimido con frecuencia. ? Alguna vez ha sido vctima de maltrato o no se siente seguro en su casa. Resumen  Adoptar un estilo de vida saludable y recibir atencin preventiva son importantes para promover la salud y el bienestar.  Siga las instrucciones del mdico   acerca de una dieta saludable, el ejercicio y la realizacin de pruebas o exmenes para detectar enfermedades.  Siga las instrucciones del mdico con respecto al control del colesterol y la presin arterial. Esta informacin no tiene como fin reemplazar el consejo del mdico. Asegrese de hacerle al mdico cualquier pregunta que tenga. Document Revised: 01/08/2018 Document Reviewed: 01/08/2018 Elsevier Patient Education  2020 [employer]  

## 2019-04-20 NOTE — Progress Notes (Signed)
Ethan Silva 55 y.o.   Chief Complaint  Patient presents with  . Annual Exam    cpe    HISTORY OF PRESENT ILLNESS: This is a 54 y.o. male here for annual exam. Has history of prediabetes, dyslipidemia, and hypertension. Takes atorvastatin, lisinopril, and omeprazole as needed for reflux symptoms. Past medical history includes cholecystectomy. Has no complaints or medical concerns today. Recent blood work results reviewed with patient.  Hemoglobin A1c at 6.0 compatible with prediabetes.  Otherwise unremarkable labs with acceptable values.   HPI   Prior to Admission medications   Medication Sig Start Date End Date Taking? Authorizing Provider  atorvastatin (LIPITOR) 40 MG tablet Take 1 tablet (40 mg total) by mouth daily. 03/19/19  Yes Myles Lipps, MD  lisinopril (ZESTRIL) 20 MG tablet Take 1 tablet (20 mg total) by mouth daily. 03/19/19  Yes Myles Lipps, MD  omeprazole (PRILOSEC) 20 MG capsule Take 1 capsule (20 mg total) by mouth daily. 03/19/19  Yes Myles Lipps, MD  acetaminophen (TYLENOL) 325 MG tablet Take 650 mg by mouth every 6 (six) hours as needed.    [provider]  MOBIC 15 MG tablet Take 1 tablet by mouth once a day  take daily for two weeks, and then only as needed afterwards. 03/02/19   [provider]    No Known Allergies  Patient Active Problem List   Diagnosis Date Noted  . BMI 30.0-30.9,adult 08/30/2015  . Hyperlipidemia 08/30/2015  . HTN (hypertension) 10/11/2012    Past Medical History:  Diagnosis Date  . Bile duct leak   . Chronic kidney disease 2017   left kidney stones  . Headache(784.0)    Hx: of when BP is elevated  . Hyperlipidemia   . Hypertension   . Numbness and tingling in hands    Hx: of  . Prediabetes     Past Surgical History:  Procedure Laterality Date  . CHOLECYSTECTOMY N/A 07/11/2012   Procedure: LAPAROSCOPIC CHOLECYSTECTOMY WITH INTRAOPERATIVE CHOLANGIOGRAM;  Surgeon: Axel Filler, MD;   Location: [facility] OR;  Service: General;  Laterality: N/A;  . COLONOSCOPY    . ERCP  07/16/2012  . ERCP N/A 07/16/2012   Procedure: ENDOSCOPIC RETROGRADE CHOLANGIOPANCREATOGRAPHY (ERCP);  Surgeon: Louis Meckel, MD;  Location: [facility] OR;  Service: Gastroenterology;  Laterality: N/A;  . ERCP N/A 10/06/2012   Procedure: ENDOSCOPIC RETROGRADE CHOLANGIOPANCREATOGRAPHY (ERCP);  Surgeon: Louis Meckel, MD;  Location: [facility];  Service: Endoscopy;  Laterality: N/A;    Social History   Socioeconomic History  . Marital status: Married    Spouse name: Davonna Belling  . Number of children: 3  . Years of education: 6th grade  . Highest education level: Not on file  Occupational History  . Occupation: Painting    Employer: [employer], [place], [place]  Tobacco Use  . Smoking status: Never Smoker  . Smokeless tobacco: Never Used  Substance and Sexual Activity  . Alcohol use: No  . Drug use: No  . Sexual activity: Yes    Partners: Female  Other Topics Concern  . Not on file  Social History Narrative   Originally from [place], [place]. Came to the [place] in 1991.   Lives with his wife and their sons.   His daughter lives in [place], [place] with her husband.   Social Determinants of Health   Financial Resource Strain:   . Difficulty of Paying Living Expenses:   Food Insecurity:   . Worried About Programme researcher, broadcasting/film/video in  the Last Year:   . [place] in the Last Year:   Transportation Needs:   . Film/video editor (Medical):   Marland Kitchen Lack of Transportation (Non-Medical):   Physical Activity:   . Days of Exercise per Week:   . Minutes of Exercise per Session:   Stress:   . Feeling of Stress :   Social Connections:   . Frequency of Communication with Friends and Family:   . Frequency of Social Gatherings with Friends and Family:   . Attends Religious Services:   . Active Member of Clubs or Organizations:   . Attends Archivist Meetings:   Marland Kitchen Marital Status:     Intimate Partner Violence:   . Fear of Current or Ex-Partner:   . Emotionally Abused:   Marland Kitchen Physically Abused:   . Sexually Abused:     Family History  Problem Relation Age of Onset  . Gallstones Mother   . Diabetes Paternal Uncle   . Gallstones Brother      Review of Systems  Constitutional: Negative.  Negative for chills and fever.  HENT: Negative.  Negative for congestion and sore throat.   Respiratory: Negative.  Negative for cough and shortness of breath.   Cardiovascular: Negative.  Negative for chest pain and palpitations.  Gastrointestinal: Negative.  Negative for abdominal pain, diarrhea, nausea and vomiting.  Genitourinary: Negative.  Negative for dysuria and hematuria.  Musculoskeletal: Negative.  Negative for back pain, myalgias and neck pain.  Skin: Negative.  Negative for rash.  Neurological: Negative.  Negative for dizziness and headaches.  Endo/Heme/Allergies: Negative.   All other systems reviewed and are negative.    Vitals:   04/20/19 1324  BP: (!) 152/92  Pulse: 62  Temp: 98.4 F (36.9 C)  SpO2: 93%     Physical Exam Vitals reviewed.  Constitutional:      Appearance: Normal appearance.  HENT:     Head: Normocephalic.     Mouth/Throat:     Mouth: Mucous membranes are moist.     Pharynx: Oropharynx is clear.  Eyes:     Extraocular Movements: Extraocular movements intact.     Conjunctiva/sclera: Conjunctivae normal.     Pupils: Pupils are equal, round, and reactive to light.  Cardiovascular:     Rate and Rhythm: Normal rate and regular rhythm.     Pulses: Normal pulses.     Heart sounds: Normal heart sounds.  Pulmonary:     Effort: Pulmonary effort is normal.     Breath sounds: Normal breath sounds.  Abdominal:     General: Bowel sounds are normal. There is no distension.     Palpations: Abdomen is soft. There is no mass.     Tenderness: There is no abdominal tenderness.  Musculoskeletal:        General: Normal range of motion.      Cervical back: Normal range of motion and neck supple.  Skin:    General: Skin is warm and dry.     Capillary Refill: Capillary refill takes less than 2 seconds.  Neurological:     General: No focal deficit present.     Mental Status: He is alert and oriented to person, place, and time.  Psychiatric:        Mood and Affect: Mood normal.        Behavior: Behavior normal.      ASSESSMENT & PLAN: Romelle was seen today for annual exam.  Diagnoses and all orders for this visit:  Routine general medical examination at a health care facility  Essential hypertension  Pre-diabetes  Dyslipidemia    Patient Instructions       If you have lab work done today you will be contacted with your lab results within the next 2 weeks.  If you have not heard from us then please contact us. The fastest way to get your results is to register for My Chart.   IF you received an x-ray today, you will receive an invoice from [facility]. Please contact [facility] at [phone] with questions or concerns regarding your invoice.   IF you received labwork today, you will receive an invoice from [facility]. Please contact [facility] at [phone] with questions or concerns regarding your invoice.   Our billing staff will not be able to assist you with questions regarding bills from these companies.  You will be contacted with the lab results as soon as they are available. The fastest way to get your results is to activate your My Chart account. Instructions are located on the last page of this paperwork. If you have not heard from us regarding the results in 2 weeks, please contact this office.       Mantenimiento de Research officer, political partyla salud en los hombres Health Maintenance, Male Adoptar un estilo de vida saludable y recibir atencin preventiva son importantes para promover la salud y Counsellorel bienestar. Consulte al mdico sobre:  El esquema adecuado para hacerse pruebas y exmenes  peridicos.  Cosas que puede hacer por su cuenta para prevenir enfermedades y [place] Cornermantenerse sano. Qu debo saber sobre la dieta, el peso y el ejercicio? Consuma una dieta saludable   Consuma una dieta que incluya muchas verduras, frutas, productos lcteos con bajo contenido de [place]grasa y Associate Professorprotenas magras.  No consuma muchos alimentos ricos en grasas slidas, azcares agregados o sodio. Mantenga un peso saludable El ndice de masa muscular [facility]) es una medida que puede utilizarse para identificar posibles problemas de Hillandalepeso. Proporciona una estimacin de la grasa corporal basndose en el peso y la altura. Su mdico puede ayudarle a Engineer, sitedeterminar su [facility] y a Personnel officerlograr o Pharmacologistmantener un peso saludable. Haga ejercicio con regularidad Haga ejercicio con regularidad. Esta es una de las prcticas ms importantes que puede hacer por su salud. La mayora de los adultos deben seguir estas pautas:  Education officer, environmentalealizar, al menos, 150minutos de actividad fsica por semana. El ejercicio debe aumentar la frecuencia cardaca y Media plannerhacerlo transpirar (ejercicio de intensidad moderada).  Hacer ejercicios de fortalecimiento por lo Rite Aidmenos dos veces por semana. Agregue esto a su plan de ejercicio de intensidad moderada.  Pasar menos tiempo sentados. Incluso la actividad fsica ligera puede ser beneficiosa. Controle sus niveles de colesterol y lpidos en la sangre Comience a realizarse anlisis de lpidos y Oncologistcolesterol en la sangre a los 20aos y luego reptalos cada 5aos. Es posible que Insurance underwriternecesite controlar los niveles de colesterol con mayor frecuencia si:  Sus niveles de lpidos y colesterol son altos.  Es mayor de 40aos.  Presenta un alto riesgo de padecer enfermedades cardacas. Qu debo saber sobre las pruebas de deteccin del cncer? Muchos tipos de cncer pueden detectarse de manera temprana y, a menudo, pueden prevenirse. Segn su historia clnica y sus antecedentes familiares, es posible que deba realizarse pruebas de  deteccin del cncer en diferentes edades. Esto puede incluir pruebas de deteccin de lo siguiente:  Building services engineerCncer colorrectal.  Cncer de prstata.  Cncer de piel.  Cncer de pulmn. Qu debo saber sobre la enfermedad cardaca, la diabetes  y la hipertensin arterial? Presin arterial y enfermedad cardaca  La hipertensin arterial causa enfermedades cardacas y [place] el riesgo de accidente cerebrovascular. Es ms probable que esto se manifieste en las personas que tienen lecturas de presin arterial alta, tienen ascendencia africana o tienen sobrepeso.  Hable con el mdico sobre sus valores de presin arterial deseados.  Hgase controlar la presin arterial: ? Cada 3 a 5 aos si tiene entre 18 y 62 aos. ? Todos los aos si es mayor de [place].  Si tiene entre 65 y 85 aos y es fumador o Insurance underwriter, pregntele al mdico si debe realizarse una prueba de deteccin de aneurisma artico abdominal (AAA) por nica vez. Diabetes Realcese exmenes de deteccin de la diabetes con regularidad. Este anlisis revisa el nivel de azcar en la sangre en [place]. Hgase las pruebas de deteccin:  Cada tresaos despus de los 45aos de edad si tiene un peso normal y un bajo riesgo de padecer diabetes.  Con ms frecuencia y a partir de [place] edad inferior si tiene sobrepeso o un alto riesgo de padecer diabetes. Qu debo saber sobre la prevencin de infecciones? Hepatitis B Si tiene un riesgo ms alto de contraer hepatitis B, debe someterse a un examen de deteccin de este virus. Hable con el mdico para averiguar si tiene riesgo de contraer la infeccin por hepatitis B. Hepatitis C Se recomienda un anlisis de [place] para:  Todos los que nacieron entre 1945 y [phone].  Todas las personas que tengan un riesgo de haber contrado hepatitis C. Enfermedades de transmisin sexual (ETS)  Debe realizarse pruebas de deteccin de ITS todos los aos, incluidas la gonorrea y la clamidia, si: ? Es sexualmente  activo y es menor de 24aos. ? Es mayor de 24aos, y Public affairs consultant informa que corre riesgo de tener este tipo de infecciones. ? La actividad sexual ha cambiado desde que le hicieron la ltima prueba de deteccin y tiene un riesgo mayor de Warehouse manager clamidia o Copy. Pregntele al mdico si usted tiene riesgo.  Pregntele al mdico si usted tiene un alto riesgo de Primary school teacher VIH. El mdico tambin puede recomendarle un medicamento recetado para ayudar a evitar la infeccin por el VIH. Si elige tomar medicamentos para prevenir el VIH, primero debe [employer] de deteccin del VIH. Luego debe hacerse anlisis cada mientras est tomando los medicamentos. Siga estas instrucciones en su casa: Estilo de vida  No consuma ningn producto que contenga nicotina o tabaco, como cigarrillos, cigarrillos electrnicos y tabaco de Theatre manager. Si necesita ayuda para dejar de fumar, consulte al mdico.  No consuma drogas.  No comparta agujas.  Solicite ayuda a su mdico si necesita apoyo o informacin para abandonar las drogas. Consumo de alcohol  No beba alcohol si el mdico se lo prohbe.  Si bebe alcohol: ? Limite la cantidad que consume de 0 a 2 medidas por da. ? Est atento a la cantidad de alcohol que hay en las bebidas que toma. En los [place], una medida equivale a una botella de cerveza de 12oz ( ), un vaso de vino de 5oz ( ) o un vaso de una bebida alcohlica de alta graduacin de 1oz (52ml). Instrucciones generales  Realcese los estudios de rutina de la salud, dentales y de Wellsite geologist.  Mantngase al da con las vacunas.  Infrmele a su mdico si: ? Se siente deprimido con frecuencia. ? Alguna vez ha sido vctima de [place] o no se siente seguro en su casa. Resumen  Adoptar  un estilo de vida saludable y recibir atencin preventiva son importantes para promover la salud y Counsellor.  Siga las instrucciones del mdico acerca de una dieta saludable, el  ejercicio y la realizacin de pruebas o exmenes para Hotel manager.  Siga las instrucciones del mdico con respecto al control del colesterol y la presin arterial. Esta informacin no tiene Theme park manager el consejo del mdico. Asegrese de hacerle al mdico cualquier pregunta que tenga. Document Revised: 01/08/2018 Document Reviewed: 01/08/2018 Elsevier Patient Education  2020 Elsevier Inc.      Edwina Barth, MD [facility]

## 2019-05-11 ENCOUNTER — Ambulatory Visit: Payer: 59 | Attending: Internal Medicine

## 2019-05-11 DIAGNOSIS — Z23 Encounter for immunization: Secondary | ICD-10-CM

## 2019-05-11 NOTE — Progress Notes (Signed)
   Covid-19 Vaccination Clinic  Name:  Ethan Silva    MRN: [id number] DOB: 08-18-1965  05/11/2019  [name] was observed post Covid-19 immunization for 15 minutes without incident. He was provided with Vaccine Information Sheet and instruction to access the V-Safe system.   [name] was instructed to call 911 with any severe reactions post vaccine: Marland Kitchen Difficulty breathing  . Swelling of face and throat  . A fast heartbeat  . A bad rash all over body  . Dizziness and weakness   Immunizations Administered    Name Date Dose VIS Date Route   Pfizer COVID-19 Vaccine 05/11/2019  8:55 AM 0.3 mL 02/25/2018 Intramuscular   Manufacturer: [employer], [employer]   Lot: [id number]   NDC: [address]-7

## 2019-08-27 ENCOUNTER — Ambulatory Visit (INDEPENDENT_AMBULATORY_CARE_PROVIDER_SITE_OTHER): Payer: 59 | Admitting: Family Medicine

## 2019-08-27 ENCOUNTER — Ambulatory Visit (INDEPENDENT_AMBULATORY_CARE_PROVIDER_SITE_OTHER): Payer: 59

## 2019-08-27 ENCOUNTER — Other Ambulatory Visit: Payer: Self-pay

## 2019-08-27 ENCOUNTER — Encounter: Payer: Self-pay | Admitting: Family Medicine

## 2019-08-27 VITALS — BP 163/107 | HR 70 | Temp 98.3°F | Ht 63.0 in | Wt 160.2 lb

## 2019-08-27 DIAGNOSIS — R1013 Epigastric pain: Secondary | ICD-10-CM | POA: Diagnosis not present

## 2019-08-27 DIAGNOSIS — R319 Hematuria, unspecified: Secondary | ICD-10-CM | POA: Diagnosis not present

## 2019-08-27 DIAGNOSIS — R109 Unspecified abdominal pain: Secondary | ICD-10-CM | POA: Diagnosis not present

## 2019-08-27 DIAGNOSIS — J069 Acute upper respiratory infection, unspecified: Secondary | ICD-10-CM

## 2019-08-27 DIAGNOSIS — I10 Essential (primary) hypertension: Secondary | ICD-10-CM

## 2019-08-27 LAB — POCT URINALYSIS DIP (MANUAL ENTRY)
Bilirubin, UA: NEGATIVE
Glucose, UA: NEGATIVE mg/dL
Ketones, POC UA: NEGATIVE mg/dL
Leukocytes, UA: NEGATIVE
Nitrite, UA: NEGATIVE
Protein Ur, POC: 100 mg/dL — AB
Spec Grav, UA: >=1.03 — AB (ref 1.010–1.025)
Urobilinogen, UA: 0.2 E.U./dL
pH, UA: 5.5 (ref 5.0–8.0)

## 2019-08-27 LAB — POC MICROSCOPIC URINALYSIS (UMFC): Mucus: ABSENT

## 2019-08-27 MED ORDER — LISINOPRIL-HYDROCHLOROTHIAZIDE 20-12.5 MG PO TABS: 1.0000 | ORAL_TABLET | Freq: Every day | ORAL | 0 refills | Status: DC

## 2019-08-27 MED ORDER — BENZONATATE 100 MG PO CAPS: 100.0000 mg | ORAL_CAPSULE | Freq: Three times a day (TID) | ORAL | 0 refills | Status: DC | PRN

## 2019-08-27 MED ORDER — OMEPRAZOLE 20 MG PO CPDR: 20.0000 mg | DELAYED_RELEASE_CAPSULE | Freq: Two times a day (BID) | ORAL | 1 refills | Status: DC

## 2019-08-27 NOTE — Patient Instructions (Addendum)
° ° ° °  If you have lab work done today you will be contacted with your lab results within the next 2 weeks.  If you have not heard from us then please contact us. The fastest way to get your results is to register for My Chart. ° ° °IF you received an x-ray today, you will receive an invoice from [facility]. Please contact [facility] at [phone] with questions or concerns regarding your invoice.  ° °IF you received labwork today, you will receive an invoice from [facility]. Please contact [facility] at [phone] with questions or concerns regarding your invoice.  ° °Our billing staff will not be able to assist you with questions regarding bills from these companies. ° °You will be contacted with the lab results as soon as they are available. The fastest way to get your results is to activate your My Chart account. Instructions are located on the last page of this paperwork. If you have not heard from us regarding the results in 2 weeks, please contact this office. °  ° ° ° °

## 2019-08-27 NOTE — Progress Notes (Signed)
8/26/20212:24 PM  Ethan Silva [birth year], 54 y.o., male [id number]  Chief Complaint  Patient presents with  . Abdominal Pain    ha shad gallstones removed tin the past, feels like the same type of pain in the stomach and back    HPI:   Patient is a 54 y.o. male with past medical history significant for pre-diabetes, HTN and HLP who presents today for abd/back pain  3 weeks for epigastric abd pain, after eating, reminded him of when he had GB stones Pain then started in bilateral flank that would radiate forward Then 6 days started having rhinorrhea, sore throat, headaches, cough, nausea, chills No fever, SOB, vomiting, diarrhea, melena, hematochezia, changes in taste or smell, hematuria, dysuria Has completed covid vaccine Today feels better He took his BP meds today - checks bp at home 120-130s   Depression screen [facility] 2/9 04/20/2019 03/19/2019 10/21/2017  Decreased Interest 0 0 0  Down, Depressed, Hopeless 0 0 0  PHQ - 2 Score 0 0 0    Fall Risk  04/20/2019 03/19/2019 10/21/2017 09/20/2017 02/22/2017  Falls in the past year? 0 0 No No No  Number falls in past yr: 0 0 - - -  Injury with Fall? 0 0 - - -  Follow up Falls evaluation completed Falls evaluation completed - - -     No Known Allergies  Prior to Admission medications   Medication Sig Start Date End Date Taking? Authorizing Provider  atorvastatin (LIPITOR) 40 MG tablet Take 1 tablet (40 mg total) by mouth daily. 03/19/19  Yes Myles Lipps, MD  lisinopril (ZESTRIL) 20 MG tablet Take 1 tablet (20 mg total) by mouth daily. 03/19/19  Yes Myles Lipps, MD    Past Medical History:  Diagnosis Date  . Bile duct leak   . Chronic kidney disease 2017   left kidney stones  . Headache(784.0)    Hx: of when BP is elevated  . Hyperlipidemia   . Hypertension   . Numbness and tingling in hands    Hx: of  . Prediabetes     Past Surgical History:  Procedure Laterality Date  . CHOLECYSTECTOMY N/A  07/11/2012   Procedure: LAPAROSCOPIC CHOLECYSTECTOMY WITH INTRAOPERATIVE CHOLANGIOGRAM;  Surgeon: Axel Filler, MD;  Location: [facility] OR;  Service: General;  Laterality: N/A;  . COLONOSCOPY    . ERCP  07/16/2012  . ERCP N/A 07/16/2012   Procedure: ENDOSCOPIC RETROGRADE CHOLANGIOPANCREATOGRAPHY (ERCP);  Surgeon: Louis Meckel, MD;  Location: [facility] OR;  Service: Gastroenterology;  Laterality: N/A;  . ERCP N/A 10/06/2012   Procedure: ENDOSCOPIC RETROGRADE CHOLANGIOPANCREATOGRAPHY (ERCP);  Surgeon: Louis Meckel, MD;  Location: [facility];  Service: Endoscopy;  Laterality: N/A;    Social History   Tobacco Use  . Smoking status: Never Smoker  . Smokeless tobacco: Never Used  Substance Use Topics  . Alcohol use: No    Family History  Problem Relation Age of Onset  . Gallstones Mother   . Diabetes Paternal Uncle   . Gallstones Brother     ROS Per hpi  OBJECTIVE:  Today's Vitals   08/27/19 1421 08/27/19 1422  BP: (!) 162/101 (!) 163/107  Pulse: 70   Temp: 98.3 F (36.8 C)   SpO2: 93%   Weight: 160 lb 3.2 oz (72.7 kg)   Height: 5\' 3"  (1.6 m)    Body mass index is 28.38 kg/m.   BP Readings from Last 3 Encounters:  08/27/19 (!) 163/107  04/20/19 (!) 152/92  03/19/19  132/62     Physical Exam Vitals and nursing note reviewed.  Constitutional:      Appearance: He is well-developed.  HENT:     Head: Normocephalic and atraumatic.  Eyes:     Extraocular Movements: Extraocular movements intact.     Conjunctiva/sclera: Conjunctivae normal.     Pupils: Pupils are equal, round, and reactive to light.  Cardiovascular:     Rate and Rhythm: Normal rate and regular rhythm.     Heart sounds: No murmur heard.  No friction rub. No gallop.   Pulmonary:     Effort: Pulmonary effort is normal.     Breath sounds: Rales (bibasilar) present. No wheezing or rhonchi.  Abdominal:     General: Bowel sounds are normal. There is no distension.     Palpations: Abdomen is soft.      Tenderness: There is abdominal tenderness in the right lower quadrant and epigastric area. There is no right CVA tenderness, left CVA tenderness, guarding or rebound. Negative signs include McBurney's sign.  Musculoskeletal:     Cervical back: Neck supple.  Skin:    General: Skin is warm and dry.  Neurological:     Mental Status: He is alert and oriented to person, place, and time.    Results for orders placed or performed in visit on 08/27/19 (from the past 24 hour(s))  POCT urinalysis dipstick     Status: Abnormal   Collection Time: 08/27/19  2:28 PM  Result Value Ref Range   Color, UA yellow yellow   Clarity, UA clear clear   Glucose, UA negative negative mg/dL   Bilirubin, UA negative negative   Ketones, POC UA negative negative mg/dL   Spec Grav, UA >=7.106 (A) 1.010 - 1.025   Blood, UA moderate (A) negative   pH, UA 5.5 5.0 - 8.0   Protein Ur, POC =100 (A) negative mg/dL   Urobilinogen, UA 0.2 0.2 or 1.0 E.U./dL   Nitrite, UA Negative Negative   Leukocytes, UA Negative Negative  POCT Microscopic Urinalysis ([facility])     Status: None   Collection Time: 08/27/19  2:38 PM  Result Value Ref Range   WBC,UR,HPF,POC None None WBC/hpf   RBC,UR,HPF,POC None None RBC/hpf   Bacteria None None, Too numerous to count   Mucus Absent Absent   Epithelial Cells, UR Per Microscopy None None, Too numerous to count cells/hpf     DG Chest 2 View  Result Date: 08/27/2019 CLINICAL DATA:  Acute upper respiratory infection.  Rales on exam. EXAM: CHEST - 2 VIEW COMPARISON:  Chest radiograph 02/26/2019 FINDINGS: The cardiomediastinal contours are normal. The lungs are clear. Pulmonary vasculature is normal. No consolidation, pleural effusion, or pneumothorax. No acute osseous abnormalities are seen. IMPRESSION: No acute chest findings. Electronically Signed   By: Narda Rutherford M.D.   On: 08/27/2019 15:03     ASSESSMENT and PLAN  1. Acute upper respiratory infection Clinically improving. covid  testing pending. Discussed quarantine until neg covid test or 10 days if positive. RTC precautions given - Novel Coronavirus, NAA ([facility]) - DG Chest 2 View  2. Epigastric pain Trial of PPI, discussed GERD diet and RTC precautions  3. Flank pain - POCT urinalysis dipstick  4. Hematuria, unspecified type - POCT Microscopic Urinalysis ([facility]) - negative  5. Essential hypertension Not at goal for past couple of visits. Adding low dose HCTZ. Reviewed RTC precautions.  Other orders - omeprazole (PRILOSEC) 20 MG capsule; Take 1 capsule (20 mg total) by mouth 2 (two)  times daily before a meal. - lisinopril-hydrochlorothiazide (ZESTORETIC) 20-12.5 MG tablet; Take 1 tablet by mouth daily. - benzonatate (TESSALON) 100 MG capsule; Take 1-2 capsules (100-200 mg total) by mouth 3 (three) times daily as needed for cough.  Return in about 3 months (around 11/27/2019) for BP with his PCP Dr Alvy Bimler.    Myles Lipps, MD Primary Care at [facility] [address] [place], [place] [address] Ph.  [phone] Fax [phone]

## 2019-08-29 LAB — NOVEL CORONAVIRUS, NAA: SARS-CoV-2, NAA: NOT DETECTED

## 2019-08-29 LAB — SARS-COV-2, NAA 2 DAY TAT

## 2019-09-17 ENCOUNTER — Encounter: Payer: Self-pay | Admitting: Family Medicine

## 2019-09-17 ENCOUNTER — Other Ambulatory Visit: Payer: Self-pay

## 2019-09-17 ENCOUNTER — Ambulatory Visit (INDEPENDENT_AMBULATORY_CARE_PROVIDER_SITE_OTHER): Payer: 59 | Admitting: Family Medicine

## 2019-09-17 VITALS — BP 136/78 | HR 60 | Temp 98.1°F | Ht 63.0 in | Wt 157.4 lb

## 2019-09-17 DIAGNOSIS — R7303 Prediabetes: Secondary | ICD-10-CM | POA: Diagnosis not present

## 2019-09-17 DIAGNOSIS — G8929 Other chronic pain: Secondary | ICD-10-CM

## 2019-09-17 DIAGNOSIS — M545 Low back pain, unspecified: Secondary | ICD-10-CM

## 2019-09-17 DIAGNOSIS — I10 Essential (primary) hypertension: Secondary | ICD-10-CM | POA: Diagnosis not present

## 2019-09-17 DIAGNOSIS — E782 Mixed hyperlipidemia: Secondary | ICD-10-CM

## 2019-09-17 DIAGNOSIS — Z23 Encounter for immunization: Secondary | ICD-10-CM | POA: Diagnosis not present

## 2019-09-17 LAB — CMP14+EGFR
ALT: 41 IU/L (ref 0–44)
AST: 23 IU/L (ref 0–40)
Albumin/Globulin Ratio: 1.5 (ref 1.2–2.2)
Albumin: 4.5 g/dL (ref 3.8–4.9)
Alkaline Phosphatase: 56 IU/L (ref 44–121)
BUN/Creatinine Ratio: 15 (ref 9–20)
BUN: 13 mg/dL (ref 6–24)
Bilirubin Total: 0.5 mg/dL (ref 0.0–1.2)
CO2: 25 mmol/L (ref 20–29)
Calcium: 9.4 mg/dL (ref 8.7–10.2)
Chloride: 101 mmol/L (ref 96–106)
Creatinine, Ser: 0.85 mg/dL (ref 0.76–1.27)
GFR calc Af Amer: 113 mL/min/{1.73_m2} (ref >59)
GFR calc non Af Amer: 97 mL/min/{1.73_m2} (ref >59)
Globulin, Total: 3 g/dL (ref 1.5–4.5)
Glucose: 98 mg/dL (ref 65–99)
Potassium: 3.7 mmol/L (ref 3.5–5.2)
Sodium: 140 mmol/L (ref 134–144)
Total Protein: 7.5 g/dL (ref 6.0–8.5)

## 2019-09-17 LAB — HEMOGLOBIN A1C
Est. average glucose Bld gHb Est-mCnc: 126 mg/dL
Hgb A1c MFr Bld: 6 % — ABNORMAL HIGH (ref 4.8–5.6)

## 2019-09-17 LAB — LIPID PANEL
Chol/HDL Ratio: 4.6 ratio (ref 0.0–5.0)
Cholesterol, Total: 204 mg/dL — ABNORMAL HIGH (ref 100–199)
HDL: 44 mg/dL (ref >39)
LDL Chol Calc (NIH): 137 mg/dL — ABNORMAL HIGH (ref 0–99)
Triglycerides: 130 mg/dL (ref 0–149)
VLDL Cholesterol Cal: 23 mg/dL (ref 5–40)

## 2019-09-17 MED ORDER — CYCLOBENZAPRINE HCL 10 MG PO TABS: 10.0000 mg | ORAL_TABLET | Freq: Every evening | ORAL | 1 refills | Status: DC | PRN

## 2019-09-17 MED ORDER — ATORVASTATIN CALCIUM 40 MG PO TABS: 40.0000 mg | ORAL_TABLET | Freq: Every day | ORAL | 3 refills | Status: DC

## 2019-09-17 MED ORDER — OMEPRAZOLE 20 MG PO CPDR
20.0000 mg | DELAYED_RELEASE_CAPSULE | Freq: Two times a day (BID) | ORAL | 3 refills | Status: DC
Start: 2019-09-17 — End: 2020-07-15

## 2019-09-17 MED ORDER — LISINOPRIL-HYDROCHLOROTHIAZIDE 20-12.5 MG PO TABS
1.0000 | ORAL_TABLET | Freq: Every day | ORAL | 1 refills | Status: DC
Start: 2019-09-17 — End: 2020-05-31

## 2019-09-17 NOTE — Patient Instructions (Addendum)
If you have lab work done today you will be contacted with your lab results within the next 2 weeks.  If you have not heard from [place] then please contact us. The fastest way to get your results is to register for My Chart.   IF you received an x-ray today, you will receive an invoice from [facility]. Please contact [facility] at [phone] with questions or concerns regarding your invoice.   IF you received labwork today, you will receive an invoice from [facility]. Please contact [facility] at [phone] with questions or concerns regarding your invoice.   Our billing staff will not be able to assist you with questions regarding bills from these companies.  You will be contacted with the lab results as soon as they are available. The fastest way to get your results is to activate your My Chart account. Instructions are located on the last page of this paperwork. If you have not heard from [place] regarding the results in 2 weeks, please contact this office.     Rehabilitacin de esguince o distensin de la parte inferior de la espalda Low Back Sprain or Strain Rehab Pregunte al mdico qu ejercicios son seguros para usted. Haga los ejercicios exactamente como se lo haya indicado el mdico y gradelos como se lo hayan indicado. Es normal sentir un estiramiento leve, tironeo, opresin o Dentist al [employer] ejercicios. Detngase de inmediato si siente un dolor repentino o Community education officer. No comience a hacer estos ejercicios hasta que se lo indique el mdico. Ejercicios de elongacin y amplitud de movimiento Estos ejercicios calientan los msculos y las articulaciones, y mejoran el movimiento y la flexibilidad de la espalda. Estos ejercicios tambin ayudan a Engineer, materials, el adormecimiento y el hormigueo. Rotacin lumbar  1. Acustese boca arriba sobre un superficie firme y flexione las rodillas. 2. Coloque los brazos extendidos a los lados de modo que cada brazo  forme un ngulo de 90grados (ngulo recto) con respecto al cuerpo. 3. Mueva lentamente (rote) ambas rodillas hacia un lado del cuerpo hasta que sienta un estiramiento en la parte inferior de la espalda (zona lumbar). Trate de no levantar los hombros del piso. 4. Mantenga esta posicin durante __________ segundos. 5. Tensione los msculos abdominales y lentamente lleve las rodillas a la posicin inicial. 6. Repita este ejercicio del otro lado del cuerpo. Repita __________ veces. Realice este ejercicio __________ veces al da. Rodilla al pecho  1. Acustese boca arriba en una superficie firme con las piernas extendidas. 2. Flexione una rodilla. Tome la rodilla con las manos y llvela hacia el pecho hasta que sienta un estiramiento suave en la parte inferior de la espalda y las nalgas. ? Mantenga la pierna en esta posicin tomando la parte frontal de la rodilla. ? Mantenga la otra pierna lo ms extendida posible. 3. Mantenga esta posicin durante __________ segundos. 4. Vuelva lentamente a la posicin inicial. 5. Repita el ejercicio con la otra pierna. Repita __________ veces. Realice este ejercicio __________ veces al da. Extensin [employer] codos, en decbito prono  1. Recustese boca abajo sobre una superficie firme (posicin prona). 2. Apyese sobre los codos. 3. Con los brazos, aydese a Haematologist sentir un leve estiramiento en el abdomen y la parte inferior de la espalda. ? Arts administrator de [employer] codos. Si no se siente cmodo, intente colocando almohadas debajo del pecho. ? Debe dejar la cadera inmvil sobre la superficie en la que est  apoyado. Mantenga la cadera y los msculos de la espalda relajados. 4. Mantenga esta posicin durante __________ segundos. 5. Afloje lentamente la parte superior del cuerpo y vuelva a la posicin inicial. Repita __________ veces. Realice este ejercicio __________ veces al da. Ejercicios de fortalecimiento Estos  ejercicios fortalecen la espalda y le otorgan resistencia. La resistencia es la capacidad de usar los msculos durante un tiempo prolongado, incluso despus de que se cansen. Inclinacin de la pelvis Este ejercicio fortalece los msculos que se encuentran en la parte profunda del abdomen. 1. Acustese boca arriba sobre una superficie firme. Doble las rodillas y [employer] pies planos sobre el piso. 2. Tensione los msculos abdominales. Eleve la pelvis hacia el techo y aplane la parte inferior de la espalda contra el suelo. ? Para realizar este ejercicio, puede colocar una toalla pequea debajo de la parte inferior de la espalda y presionar la espalda contra la toalla. 3. Mantenga esta posicin durante __________ segundos. 4. Relaje totalmente los msculos antes de repetir el ejercicio. Repita __________ veces. Realice este ejercicio __________ veces al da. Elevaciones alternadas de pierna y brazo  1. Apoye las palmas de las manos y las rodillas sobre una superficie firme. Si est sobre un suelo duro, puede usar un elemento acolchado, como una alfombrilla para ejercicios, para apoyar las rodillas. 2. Alinee los brazos y las piernas. Las manos deben estar justo debajo de los hombros, y las rodillas debajo de la cadera. 3. Eleve la pierna izquierda hacia atrs. Al mismo tiempo, eleve el brazo derecho y Associate Professor frente a usted. ? No eleve la pierna por encima de la cadera. ? No eleve el brazo por encima del hombro. ? Mantenga los msculos del abdomen y de la espalda contrados. ? Mantenga la cadera [employer] el suelo. ? No arquee la espalda. ? Mantenga el equilibrio con cuidado y no contenga la respiracin. 4. Mantenga esta posicin durante __________ segundos. 5. Vuelva lentamente a la posicin inicial. 6. Repita con su pierna derecha y su brazo izquierdo. Repita __________ veces. Realice este ejercicio __________ veces al da. Serie de abdominales con levantamiento de pierna  extendida  1. Acustese boca arriba sobre una superficie firme. 2. Flexione una rodilla y [place] la otra pierna extendida. 3. Tensione los msculos abdominales y levante la pierna extendida, a unas 4 o 6pulgadas (10 o 15cm) del suelo. 4. Mantenga apretados los msculos abdominales y sostenga esta posicin durante __________ segundos. ? No contenga la respiracin. ? No arquee la espalda. Mantngala plana contra el suelo. 5. Mantenga tensos los msculos abdominales mientras baja lentamente la pierna hasta la posicin inicial. 6. Repita el ejercicio con la otra pierna. Repita __________ veces. Realice este ejercicio __________ veces al da. Bajar una pierna con rodillas flexionadas 1. Acustese boca arriba sobre una superficie firme. 2. [employer] los msculos abdominales y [employer] del piso, uno a la vez, de modo que las rodillas y la cadera estn flexionadas en ngulos de 90grados (ngulos rectos). ? Las rodillas deben estar por encima de la cadera y las pantorrillas deben quedar paralelas al piso. 3. Con los msculos abdominales tensos y la rodilla flexionada, baje lentamente una pierna de modo que los dedos del pie toquen el suelo. 4. Levante la pierna para volver a la posicin inicial. ? No contenga la respiracin. ? No deje que la espalda se arquee. Mantenga la espalda plana contra el suelo. 5. Repita el ejercicio con la otra pierna. Repita __________ veces. Realice este ejercicio __________ veces al da.  da. Postura y mecnica corporal La buena postura y la mecnica corporal saludable pueden ayudar a aliviar el estrs en las articulaciones y los tejidos del cuerpo. La mecnica corporal se refiere a los movimientos y a las posiciones del cuerpo mientras realiza las actividades diarias. La postura es una parte de la mecnica corporal. Una buena postura significa que:  La columna est en su posicin natural de curvatura en S (neutral).  Los hombros estn ligeramente hacia  atrs.  La cabeza no est inclinada hacia adelante. Siga esas pautas para mejorar la postura y la mecnica corporal en sus actividades diarias. De pie   Al estar de pie, mantenga la columna en la posicin neutral y los pies separados al ancho de caderas, aproximadamente. Mantenga las rodillas ligeramente flexionadas. Las orejas, los hombros y las caderas deben estar alineados.  Cuando realice una tarea en la que deba estar de pie en el mismo sitio durante mucho tiempo, coloque un pie en un objeto estable de 2 a 4pulgadas (5a 10cm) de alto, como un taburete. Esto ayuda a que la columna mantenga una posicin neutral. Sentado   Cuando est sentado, mantenga la columna en posicin neutral y deje los pies apoyados en el suelo. Use un apoyapis, si es necesario, y mantenga los muslos paralelos al suelo. Evite redondear los hombros e inclinar la cabeza hacia adelante.  Cuando trabaje en un escritorio o con una computadora, el escritorio debe estar a una altura en la que las manos estn un poco ms abajo que los codos. Deslice la silla debajo del escritorio, de modo de estar lo suficientemente cerca como para mantener una buena postura.  Cuando trabaje con una computadora, coloque el monitor a una altura que le permita mirar derecho hacia adelante, sin tener que inclinar la cabeza hacia adelante o hacia atrs. Reposo  Al descansar o estar acostado, evite las posiciones que le causen ms dolor.  Si siente dolor al hacer actividades que exigen sentarse, inclinarse, agacharse o ponerse en cuclillas, acustese en una posicin en la que el cuerpo no deba doblarse mucho. Por ejemplo, evite acurrucarse de costado con los brazos y las rodillas cerca del pecho (posicin fetal).  Si siente dolor con las actividades que exigen estar de pie durante mucho tiempo o estirar los brazos, acustese con la columna en una posicin neutral y flexione ligeramente las rodillas. Pruebe con las siguientes  posiciones: ? Acostarse de costado con una almohada entre las rodillas. ? Acostarse boca arriba con una almohada debajo de las rodillas. Levantar objetos   Cuando tenga que levantar un objeto, mantenga los pies separados el ancho de los hombros, como mnimo, y apriete los msculos abdominales.  Flexione las rodillas y la cadera, y mantenga la columna en posicin neutral. Es importante levantarse utilizando la fuerza de las piernas, no de la espalda. No trabe las rodillas hacia afuera.  Siempre pida ayuda a otra persona para levantar objetos pesados o incmodos. Esta informacin no tiene como fin reemplazar el consejo del mdico. Asegrese de hacerle al mdico cualquier pregunta que tenga. Document Revised: 02/12/2018 Document Reviewed: 02/12/2018 Elsevier Patient Education  2020 Elsevier Inc.  

## 2019-09-17 NOTE — Progress Notes (Signed)
9/16/202110:29 AM  [place] 1965/07/18, 54 y.o., male [id number]  Chief Complaint  Patient presents with  . Hypertension  . Hyperlipidemia  . Back Pain    upon movement for 3 wks now    HPI:   Patient is a 54 y.o. male with past medical history significant for pre-diabetes, HTN and HLP who presents today for routine visit  Last OV Aug 27 2019 - trial of PPI, added HCTZ  He reports GERD is improved on PPI Having low back pain that radiates up his back, does not radiate down his legs, denies any numbness or tingling, no changes in bowel or bladder function Worse with movement or lying on his back, better once he gets moving He reports he is taking lipitor, lisinopril and prilosec Requesting flu vaccine today  Lab Results  Component Value Date   HGBA1C 6.0 (H) 03/19/2019   HGBA1C 5.6 09/20/2017   HGBA1C 6.2 08/01/2016   Lab Results  Component Value Date   LDLCALC 122 (H) 03/19/2019   CREATININE 0.77 03/19/2019   BP Readings from Last 3 Encounters:  09/17/19 136/78  08/27/19 (!) 163/107  04/20/19 (!) 152/92    Depression screen PHQ 2/9 04/20/2019 03/19/2019 10/21/2017  Decreased Interest 0 0 0  Down, Depressed, Hopeless 0 0 0  PHQ - 2 Score 0 0 0    Fall Risk  09/17/2019 04/20/2019 03/19/2019 10/21/2017 09/20/2017  Falls in the past year? 0 0 0 No No  Number falls in past yr: 0 0 0 - -  Injury with Fall? 0 0 0 - -  Follow up - Falls evaluation completed Falls evaluation completed - -     No Known Allergies  Prior to Admission medications   Medication Sig Start Date End Date Taking? Authorizing Provider  atorvastatin (LIPITOR) 40 MG tablet Take 1 tablet (40 mg total) by mouth daily. Patient not taking: Reported on 09/17/2019 03/19/19   Rutherford Guys, MD  lisinopril-hydrochlorothiazide (ZESTORETIC) 20-12.5 MG tablet Take 1 tablet by mouth daily. Patient not taking: Reported on 09/17/2019 08/27/19   Rutherford Guys, MD  omeprazole (PRILOSEC) 20  MG capsule Take 1 capsule (20 mg total) by mouth 2 (two) times daily before a meal. Patient not taking: Reported on 09/17/2019 08/27/19   Rutherford Guys, MD    Past Medical History:  Diagnosis Date  . Bile duct leak   . Chronic kidney disease 2017   left kidney stones  . Headache(784.0)    Hx: of when BP is elevated  . Hyperlipidemia   . Hypertension   . Numbness and tingling in hands    Hx: of  . Prediabetes     Past Surgical History:  Procedure Laterality Date  . CHOLECYSTECTOMY N/A 07/11/2012   Procedure: LAPAROSCOPIC CHOLECYSTECTOMY WITH INTRAOPERATIVE CHOLANGIOGRAM;  Surgeon: Ralene Ok, MD;  Location: [place];  Service: General;  Laterality: N/A;  . COLONOSCOPY    . ERCP  07/16/2012  . ERCP N/A 07/16/2012   Procedure: ENDOSCOPIC RETROGRADE CHOLANGIOPANCREATOGRAPHY (ERCP);  Surgeon: Inda Castle, MD;  Location: [facility];  Service: Gastroenterology;  Laterality: N/A;  . ERCP N/A 10/06/2012   Procedure: ENDOSCOPIC RETROGRADE CHOLANGIOPANCREATOGRAPHY (ERCP);  Surgeon: Inda Castle, MD;  Location: [facility];  Service: Endoscopy;  Laterality: N/A;    Social History   Tobacco Use  . Smoking status: Never Smoker  . Smokeless tobacco: Never Used  Substance Use Topics  . Alcohol use: No    Family History  Problem Relation  Age of Onset  . Gallstones Mother   . Diabetes Paternal Uncle   . Gallstones Brother     Review of Systems  Constitutional: Negative for chills and fever.  Respiratory: Negative for cough and shortness of breath.   Cardiovascular: Negative for chest pain, palpitations and leg swelling.  Gastrointestinal: Negative for abdominal pain, nausea and vomiting.   Per hpi  OBJECTIVE:  Today's Vitals   09/17/19 1015  BP: 136/78  Pulse: 60  Temp: 98.1 F (36.7 C)  SpO2: 97%  Weight: 157 lb 6.4 oz (71.4 kg)  Height: 5' 3"  (1.6 m)   Body mass index is 27.88 kg/m.   Physical Exam Vitals and nursing note reviewed.  Constitutional:       Appearance: He is well-developed.  HENT:     Head: Normocephalic and atraumatic.  Eyes:     Extraocular Movements: Extraocular movements intact.     Conjunctiva/sclera: Conjunctivae normal.     Pupils: Pupils are equal, round, and reactive to light.  Cardiovascular:     Rate and Rhythm: Normal rate and regular rhythm.     Heart sounds: No murmur heard.  No friction rub. No gallop.   Pulmonary:     Effort: Pulmonary effort is normal.     Breath sounds: Normal breath sounds. No wheezing, rhonchi or rales.  Musculoskeletal:     Cervical back: Normal and neck supple.     Thoracic back: Spasms and tenderness present. No bony tenderness. Normal range of motion.     Lumbar back: Spasms and tenderness present. No bony tenderness. Normal range of motion. Negative right straight leg raise test and negative left straight leg raise test.  Skin:    General: Skin is warm and dry.  Neurological:     Mental Status: He is alert and oriented to person, place, and time.     Gait: Gait normal.     Deep Tendon Reflexes: Reflexes normal.     No results found for this or any previous visit (from the past 24 hour(s)).  No results found.   ASSESSMENT and PLAN  1. Essential hypertension Sign improved and very close to goal. Cont LFM - Lipid panel - CMP14+EGFR  2. Mixed hyperlipidemia Checking labs today, medications will be adjusted as needed.  - atorvastatin (LIPITOR) 40 MG tablet; Take 1 tablet (40 mg total) by mouth daily.  3. Pre-diabetes Checking labs today, cont working on LFM - Hemoglobin A1c  4. Chronic bilateral low back pain without sciatica Discussed supportive measures, new meds r/se/b and RTC precautions. Patient educational handout given.  5. Need for prophylactic vaccination and inoculation against influenza - Flu Vaccine QUAD 36+ mos IM  Other orders - lisinopril-hydrochlorothiazide (ZESTORETIC) 20-12.5 MG tablet; Take 1 tablet by mouth daily. - omeprazole (PRILOSEC) 20  MG capsule; Take 1 capsule (20 mg total) by mouth 2 (two) times daily before a meal. - cyclobenzaprine (FLEXERIL) 10 MG tablet; Take 1 tablet (10 mg total) by mouth at bedtime as needed for muscle spasms.  Return for as scheduled.    Rutherford Guys, MD [facility] [place], [place] [address] Ph.  [phone] Fax [phone]

## 2019-11-30 ENCOUNTER — Encounter: Payer: Self-pay | Admitting: Emergency Medicine

## 2019-11-30 ENCOUNTER — Other Ambulatory Visit: Payer: Self-pay

## 2019-11-30 ENCOUNTER — Ambulatory Visit (INDEPENDENT_AMBULATORY_CARE_PROVIDER_SITE_OTHER): Payer: 59 | Admitting: Emergency Medicine

## 2019-11-30 VITALS — BP 125/73 | HR 66 | Temp 98.5°F | Resp 16 | Ht 63.0 in | Wt 161.0 lb

## 2019-11-30 DIAGNOSIS — Z20822 Contact with and (suspected) exposure to covid-19: Secondary | ICD-10-CM | POA: Diagnosis not present

## 2019-11-30 DIAGNOSIS — R7303 Prediabetes: Secondary | ICD-10-CM | POA: Diagnosis not present

## 2019-11-30 DIAGNOSIS — I10 Essential (primary) hypertension: Secondary | ICD-10-CM

## 2019-11-30 DIAGNOSIS — E782 Mixed hyperlipidemia: Secondary | ICD-10-CM

## 2019-11-30 NOTE — Progress Notes (Signed)
Ethan Silva 54 y.o.   Chief Complaint  Patient presents with   Hypertension    follow up 3 month   Cough    and pain in my back, the pills helped they gave the last time    HISTORY OF PRESENT ILLNESS: This is a 54 y.o. male with history of hypertension here for 65-month follow-up. Presently taking Zestoretic 20-12.5 mg daily. Also taking atorvastatin 40 mg daily. Had Covid infection 1 year ago. Had flulike symptoms and cough about 3 months ago. No other complaints or medical concerns.  HPI   Prior to Admission medications   Medication Sig Start Date End Date Taking? Authorizing Provider  atorvastatin (LIPITOR) 40 MG tablet Take 1 tablet (40 mg total) by mouth daily. 09/17/19   Myles Lipps, MD  cyclobenzaprine (FLEXERIL) 10 MG tablet Take 1 tablet (10 mg total) by mouth at bedtime as needed for muscle spasms. 09/17/19   Myles Lipps, MD  lisinopril-hydrochlorothiazide (ZESTORETIC) 20-12.5 MG tablet Take 1 tablet by mouth daily. 09/17/19   Myles Lipps, MD  omeprazole (PRILOSEC) 20 MG capsule Take 1 capsule (20 mg total) by mouth 2 (two) times daily before a meal. 09/17/19   Myles Lipps, MD    No Known Allergies  Patient Active Problem List   Diagnosis Date Noted   BMI 30.0-30.9,adult 08/30/2015   Hyperlipidemia 08/30/2015   HTN (hypertension) 10/11/2012    Past Medical History:  Diagnosis Date   Bile duct leak    Chronic kidney disease 2017   left kidney stones   Headache(784.0)    Hx: of when BP is elevated   Hyperlipidemia    Hypertension    Numbness and tingling in hands    Hx: of   Prediabetes     Past Surgical History:  Procedure Laterality Date   CHOLECYSTECTOMY N/A 07/11/2012   Procedure: LAPAROSCOPIC CHOLECYSTECTOMY WITH INTRAOPERATIVE CHOLANGIOGRAM;  Surgeon: Axel Filler, MD;  Location: [facility] OR;  Service: General;  Laterality: N/A;   COLONOSCOPY     ERCP  07/16/2012   ERCP N/A 07/16/2012   Procedure:  ENDOSCOPIC RETROGRADE CHOLANGIOPANCREATOGRAPHY (ERCP);  Surgeon: Louis Meckel, MD;  Location: [facility] OR;  Service: Gastroenterology;  Laterality: N/A;   ERCP N/A 10/06/2012   Procedure: ENDOSCOPIC RETROGRADE CHOLANGIOPANCREATOGRAPHY (ERCP);  Surgeon: Louis Meckel, MD;  Location: [facility];  Service: Endoscopy;  Laterality: N/A;    Social History   Socioeconomic History   Marital status: Married    Spouse name: Davonna Belling   Number of children: 3   Years of education: 6th grade   Highest education level: Not on file  Occupational History   Occupation: Painting    Employer: [employer], [place], [place]  Tobacco Use   Smoking status: Never Smoker   Smokeless tobacco: Never Used  Substance and Sexual Activity   Alcohol use: No   Drug use: No   Sexual activity: Yes    Partners: Female  Other Topics Concern   Not on file  Social History Narrative   Originally from [place], [place]. Came to the [place] in 1991.   Lives with his wife and their sons.   His daughter lives in [place], [place] with her husband.   Social Determinants of Health   Financial Resource Strain:    Difficulty of Paying Living Expenses: Not on file  Food Insecurity:    Worried About Running Out of Food in the Last Year: Not on file   [employer] of Food in the  Last Year: Not on file  Transportation Needs:    Lack of Transportation (Medical): Not on file   Lack of Transportation (Non-Medical): Not on file  Physical Activity:    Days of Exercise per Week: Not on file   Minutes of Exercise per Session: Not on file  Stress:    Feeling of Stress : Not on file  Social Connections:    Frequency of Communication with Friends and Family: Not on file   Frequency of Social Gatherings with Friends and Family: Not on file   Attends Religious Services: Not on file   Active Member of Clubs or Organizations: Not on file   Attends Banker Meetings: Not on file   Marital  Status: Not on file  Intimate Partner Violence:    Fear of Current or Ex-Partner: Not on file   Emotionally Abused: Not on file   Physically Abused: Not on file   Sexually Abused: Not on file    Family History  Problem Relation Age of Onset   Gallstones Mother    Diabetes Paternal Uncle    Gallstones Brother      Review of Systems  Constitutional: Negative.  Negative for chills and fever.  HENT: Negative.  Negative for congestion and sore throat.   Respiratory: Negative.  Negative for cough and shortness of breath.   Cardiovascular: Negative.  Negative for chest pain and palpitations.  Gastrointestinal: Negative.  Negative for abdominal pain, diarrhea, nausea and vomiting.  Genitourinary: Negative.  Negative for dysuria and hematuria.  Skin: Negative.  Negative for rash.  Neurological: Negative.  Negative for dizziness and headaches.  All other systems reviewed and are negative.   Today's Vitals   11/30/19 1515  BP: 125/73  Pulse: 66  Resp: 16  Temp: 98.5 F (36.9 C)  TempSrc: Temporal  SpO2: 96%  Weight: 161 lb (73 kg)  Height: 5\' 3"  (1.6 m)   Body mass index is 28.52 kg/m.  Physical Exam Vitals reviewed.  Constitutional:      Appearance: Normal appearance.  HENT:     Head: Normocephalic.  Eyes:     Extraocular Movements: Extraocular movements intact.     Conjunctiva/sclera: Conjunctivae normal.     Pupils: Pupils are equal, round, and reactive to light.  Neck:     Vascular: No carotid bruit.  Cardiovascular:     Rate and Rhythm: Normal rate and regular rhythm.     Pulses: Normal pulses.     Heart sounds: Normal heart sounds.  Pulmonary:     Effort: Pulmonary effort is normal.     Breath sounds: Normal breath sounds.  Musculoskeletal:        General: Normal range of motion.     Cervical back: Normal range of motion and neck supple. No tenderness.  Lymphadenopathy:     Cervical: No cervical adenopathy.  Skin:    General: Skin is warm and dry.      Capillary Refill: Capillary refill takes less than 2 seconds.  Neurological:     General: No focal deficit present.     Mental Status: He is alert and oriented to person, place, and time.  Psychiatric:        Mood and Affect: Mood normal.        Behavior: Behavior normal.    A total of 30 minutes was spent with the patient, greater than 50% of which was in counseling/coordination of care regarding hypertension and dyslipidemia and cardiovascular risks associated with these conditions, review of  all medications, review of most recent blood work results, review of most recent office visit notes, education on nutrition, health maintenance items, prognosis, documentation, need for follow-up.   ASSESSMENT & PLAN: Essential hypertension Well-controlled hypertension.  Normal labs from September 2021 with normal renal function. Continue Zestoretic 20-12.5 mg daily. Continue atorvastatin 40 mg daily. Diet and nutrition discussed. Follow-up in 6 months.  Ethan Silva was seen today for hypertension and cough.  Diagnoses and all orders for this visit:  Essential hypertension  Mixed hyperlipidemia  Pre-diabetes  Exposure to COVID-19 virus -     SAR CoV2 Serology (COVID 19)AB(IGG)IA    Patient Instructions       If you have lab work done today you will be contacted with your lab results within the next 2 weeks.  If you have not heard from [place] then please contact us. The fastest way to get your results is to register for My Chart.   IF you received an x-ray today, you will receive an invoice from [facility]. Please contact [facility] at [phone] with questions or concerns regarding your invoice.   IF you received labwork today, you will receive an invoice from [facility]. Please contact [facility] at [phone] with questions or concerns regarding your invoice.   Our billing staff will not be able to assist you with questions regarding bills from these  companies.  You will be contacted with the lab results as soon as they are available. The fastest way to get your results is to activate your My Chart account. Instructions are located on the last page of this paperwork. If you have not heard from [place] regarding the results in 2 weeks, please contact this office.     Hipertensin en los adultos Hypertension, Adult El trmino hipertensin es otra forma de denominar a la presin arterial elevada. La presin arterial elevada fuerza al corazn a trabajar ms para bombear la sangre. Esto puede causar problemas con el paso del [place]. Una lectura de presin arterial est compuesta por 2 nmeros. Hay un nmero superior (sistlico) sobre un nmero inferior (diastlico). Lo ideal es tener la presin arterial por debajo de 120/80. Las elecciones saludables pueden ayudar a Personal assistant presin arterial, o tal vez necesite medicamentos para bajarla. Cules son las causas? Se desconoce la causa de esta afeccin. Algunas afecciones pueden estar relacionadas con la presin arterial alta. Qu incrementa el riesgo?  Fumar.  Tener diabetes mellitus tipo 2, colesterol alto, o ambos.  No hacer la cantidad suficiente de actividad fsica o ejercicio.  Tener sobrepeso.  Consumir mucha grasa, azcar, caloras o sal (sodio) en su dieta.  Beber alcohol en exceso.  Tener una enfermedad renal a largo plazo (crnica).  Tener antecedentes familiares de presin arterial alta.  Edad. Los riesgos aumentan con la edad.  Raza. El riesgo es mayor para las Statistician.  Sexo. Antes de los 45aos, los hombres corren ms [employer]. Despus de los 65aos, las mujeres corren ms [employer].  Tener apnea obstructiva del sueo.  Estrs. Cules son los signos o los sntomas?  Es posible que la presin arterial alta puede no cause sntomas. La presin arterial muy alta (crisis hipertensiva) puede provocar: ? Dolor de  [place]. ? Sensaciones de preocupacin o nerviosismo (ansiedad). ? Falta de aire. ? Hemorragia nasal. ? Sensacin de Journalist, newspaper (nuseas). ? Vmitos. ? Cambios en la forma de ver. ? Dolor muy intenso en el pecho. ? Convulsiones. Cmo se trata?  Esta  afeccin se trata haciendo cambios saludables en el estilo de vida, por ejemplo: ? Consumir alimentos saludables. ? Hacer ms ejercicio. ? Beber menos alcohol.  El mdico puede recetarle medicamentos si los cambios en el estilo de vida no son suficientes para Museum/gallery curator la presin arterial y si: ? El nmero de arriba est por encima de 130. ? El nmero de abajo est por encima de 80.  Su presin arterial personal ideal puede variar. Siga estas instrucciones en su casa: Comida y bebida   Si se lo dicen, siga el plan de alimentacin de DASH (Dietary Approaches to Stop Hypertension, Maneras de alimentarse para detener la hipertensin). Para seguir este plan: ? Llene la mitad del plato de cada comida con frutas y verduras. ? Llene un cuarto del plato de cada comida con cereales integrales. Los cereales integrales incluyen pasta integral, arroz integral y pan integral. ? Coma y beba productos lcteos con bajo contenido de grasa, como leche descremada o yogur bajo en grasas. ? Llene un cuarto del plato de cada comida con protenas bajas en grasa (magras). Las protenas bajas en grasa incluyen pescado, pollo sin piel, huevos, frijoles y tofu. ? Evite consumir carne grasa, carne curada y procesada, o pollo con piel. ? Evite consumir alimentos prehechos o procesados.  Consuma menos de 1500 mg de sal por da.  No beba alcohol si: ? El mdico le indica que no lo haga. ? Est embarazada, puede estar embarazada o est tratando de quedar embarazada.  Si bebe alcohol: ? Limite la cantidad que bebe a lo siguiente:  De 0 a 1 medida por da para las mujeres.  De 0 a 2 medidas por da para los hombres. ? Est atento a la  cantidad de alcohol que hay en las bebidas que toma. En los [place], una medida equivale a una botella de cerveza de 12oz ( ), un vaso de vino de 5oz ( ) o un vaso de una bebida alcohlica de alta graduacin de 1oz (54ml). Estilo de vida   Trabaje con su mdico para mantenerse en un peso saludable o para perder peso. Pregntele a su mdico cul es el peso recomendable para usted.  Haga al menos de ejercicio la [employer] de la [place]. Estos pueden incluir caminar, nadar o andar en bicicleta.  Realice al menos 30 minutos de ejercicio que fortalezca sus msculos (ejercicios de resistencia) al menos 3 das a la [place]. Estos pueden incluir levantar pesas o hacer Pilates.  No consuma ningn producto que contenga nicotina o tabaco, como cigarrillos, cigarrillos electrnicos y tabaco de Theatre manager. Si necesita ayuda para dejar de fumar, consulte al [employer].  Controle su presin arterial en su casa tal como le indic el mdico.  Concurra a todas las visitas de seguimiento como se lo haya indicado el mdico. Esto es importante. Medicamentos  [employer] de venta libre y los recetados solamente como se lo haya indicado el mdico. Siga cuidadosamente las indicaciones.  No omita las dosis de medicamentos para la presin arterial. Los medicamentos pierden eficacia si omite dosis. El hecho de omitir las dosis tambin [place] el riesgo de otros problemas.  Pregntele a su mdico a qu efectos secundarios o reacciones a los Museum/gallery curator. Comunquese con un mdico si:  Piensa que tiene [place] reaccin a los medicamentos que est tomando.  Tiene dolores de cabeza frecuentes (recurrentes).  Se siente mareado.  Tiene hinchazn en los tobillos.  Tiene problemas de visin. Solicite ayuda inmediatamente si:  Siente un dolor de cabeza muy intenso.  Empieza a sentirse desorientado (confundido).  Se siente dbil o adormecido.  Siente  que va a desmayarse.  Tiene un dolor muy intenso en las siguientes zonas: ? Pecho. ? Vientre (abdomen).  Vomita ms de una vez.  Tiene dificultad para respirar. Resumen  El trmino hipertensin es otra forma de denominar a la presin arterial elevada.  La presin arterial elevada fuerza al corazn a trabajar ms para bombear la sangre.  Para la Franklin Resourcesmayora de las personas, una presin arterial normal es menor que 120/80.  Las decisiones saludables pueden ayudarle a disminuir su presin arterial. Si no puede bajar su presin arterial mediante decisiones saludables, es posible que deba tomar medicamentos. Esta informacin no tiene Theme park managercomo fin reemplazar el consejo del mdico. Asegrese de hacerle al mdico cualquier pregunta que tenga. Document Revised: 10/03/2017 Document Reviewed: 10/03/2017 Elsevier Patient Education  2020 Elsevier Inc.      Edwina BarthMiguel Tytionna Cloyd, MD [facility]

## 2019-11-30 NOTE — Patient Instructions (Addendum)
   If you have lab work done today you will be contacted with your lab results within the next 2 weeks.  If you have not heard from us then please contact us. The fastest way to get your results is to register for My Chart.   IF you received an x-ray today, you will receive an invoice from [facility]. Please contact [facility] at [phone] with questions or concerns regarding your invoice.   IF you received labwork today, you will receive an invoice from [facility]. Please contact [facility] at [phone] with questions or concerns regarding your invoice.   Our billing staff will not be able to assist you with questions regarding bills from these companies.  You will be contacted with the lab results as soon as they are available. The fastest way to get your results is to activate your My Chart account. Instructions are located on the last page of this paperwork. If you have not heard from us regarding the results in 2 weeks, please contact this office.      Hipertensin en los adultos Hypertension, Adult El trmino hipertensin es otra forma de denominar a la presin arterial elevada. La presin arterial elevada fuerza al corazn a trabajar ms para bombear la sangre. Esto puede causar problemas con el paso del tiempo. Una lectura de presin arterial est compuesta por 2 nmeros. Hay un nmero superior (sistlico) sobre un nmero inferior (diastlico). Lo ideal es tener la presin arterial por debajo de 120/80. Las elecciones saludables pueden ayudar a bajar la presin arterial, o tal vez necesite medicamentos para bajarla. Cules son las causas? Se desconoce la causa de esta afeccin. Algunas afecciones pueden estar relacionadas con la presin arterial alta. Qu incrementa el riesgo?  Fumar.  Tener diabetes mellitus tipo 2, colesterol alto, o ambos.  No hacer la cantidad suficiente de actividad fsica o ejercicio.  Tener sobrepeso.  Consumir mucha grasa,  azcar, caloras o sal (sodio) en su dieta.  Beber alcohol en exceso.  Tener una enfermedad renal a largo plazo (crnica).  Tener antecedentes familiares de presin arterial alta.  Edad. Los riesgos aumentan con la edad.  Raza. El riesgo es mayor para las personas afroamericanas.  Sexo. Antes de los 45aos, los hombres corren ms riesgo que las mujeres. Despus de los 65aos, las mujeres corren ms riesgo que los hombres.  Tener apnea obstructiva del sueo.  Estrs. Cules son los signos o los sntomas?  Es posible que la presin arterial alta puede no cause sntomas. La presin arterial muy alta (crisis hipertensiva) puede provocar: ? Dolor de cabeza. ? Sensaciones de preocupacin o nerviosismo (ansiedad). ? Falta de aire. ? Hemorragia nasal. ? Sensacin de malestar en el estmago (nuseas). ? Vmitos. ? Cambios en la forma de ver. ? Dolor muy intenso en el pecho. ? Convulsiones. Cmo se trata?  Esta afeccin se trata haciendo cambios saludables en el estilo de vida, por ejemplo: ? Consumir alimentos saludables. ? Hacer ms ejercicio. ? Beber menos alcohol.  El mdico puede recetarle medicamentos si los cambios en el estilo de vida no son suficientes para lograr controlar la presin arterial y si: ? El nmero de arriba est por encima de 130. ? El nmero de abajo est por encima de 80.  Su presin arterial personal ideal puede variar. Siga estas instrucciones en su casa: Comida y bebida   Si se lo dicen, siga el plan de alimentacin de DASH (Dietary Approaches to Stop Hypertension, Maneras de alimentarse para detener la hipertensin).   Para seguir este plan: ? Llene la mitad del plato de cada comida con frutas y verduras. ? Llene un cuarto del plato de cada comida con cereales integrales. Los cereales integrales incluyen pasta integral, arroz integral y pan integral. ? Coma y beba productos lcteos con bajo contenido de grasa, como leche descremada o yogur bajo en  grasas. ? Llene un cuarto del plato de cada comida con protenas bajas en grasa (magras). Las protenas bajas en grasa incluyen pescado, pollo sin piel, huevos, frijoles y tofu. ? Evite consumir carne grasa, carne curada y procesada, o pollo con piel. ? Evite consumir alimentos prehechos o procesados.  Consuma menos de 1500 mg de sal por da.  No beba alcohol si: ? El mdico le indica que no lo haga. ? Est embarazada, puede estar embarazada o est tratando de quedar embarazada.  Si bebe alcohol: ? Limite la cantidad que bebe a lo siguiente:  De 0 a 1 medida por da para las mujeres.  De 0 a 2 medidas por da para los hombres. ? Est atento a la cantidad de alcohol que hay en las bebidas que toma. En los [place], una medida equivale a una botella de cerveza de 12oz (355ml), un vaso de vino de 5oz (148ml) o un vaso de una bebida alcohlica de alta graduacin de 1oz (44ml). Estilo de vida   Trabaje con su mdico para mantenerse en un peso saludable o para perder peso. Pregntele a su mdico cul es el peso recomendable para usted.  Haga al menos 30minutos de ejercicio la mayora de los das de la semana. Estos pueden incluir caminar, nadar o andar en bicicleta.  Realice al menos 30 minutos de ejercicio que fortalezca sus msculos (ejercicios de resistencia) al menos 3 das a la semana. Estos pueden incluir levantar pesas o hacer Pilates.  No consuma ningn producto que contenga nicotina o tabaco, como cigarrillos, cigarrillos electrnicos y tabaco de mascar. Si necesita ayuda para dejar de fumar, consulte al mdico.  Controle su presin arterial en su casa tal como le indic el mdico.  Concurra a todas las visitas de seguimiento como se lo haya indicado el mdico. Esto es importante. Medicamentos  Tome los medicamentos de venta libre y los recetados solamente como se lo haya indicado el mdico. Siga cuidadosamente las indicaciones.  No omita las dosis de medicamentos  para la presin arterial. Los medicamentos pierden eficacia si omite dosis. El hecho de omitir las dosis tambin aumenta el riesgo de otros problemas.  Pregntele a su mdico a qu efectos secundarios o reacciones a los medicamentos debe prestar atencin. Comunquese con un mdico si:  Piensa que tiene una reaccin a los medicamentos que est tomando.  Tiene dolores de cabeza frecuentes (recurrentes).  Se siente mareado.  Tiene hinchazn en los tobillos.  Tiene problemas de visin. Solicite ayuda inmediatamente si:  Siente un dolor de cabeza muy intenso.  Empieza a sentirse desorientado (confundido).  Se siente dbil o adormecido.  Siente que va a desmayarse.  Tiene un dolor muy intenso en las siguientes zonas: ? Pecho. ? Vientre (abdomen).  Vomita ms de una vez.  Tiene dificultad para respirar. Resumen  El trmino hipertensin es otra forma de denominar a la presin arterial elevada.  La presin arterial elevada fuerza al corazn a trabajar ms para bombear la sangre.  Para la mayora de las personas, una presin arterial normal es menor que 120/80.  Las decisiones saludables pueden ayudarle a disminuir su presin arterial. Si no puede   bajar su presin arterial mediante decisiones saludables, es posible que deba tomar medicamentos. Esta informacin no tiene como fin reemplazar el consejo del mdico. Asegrese de hacerle al mdico cualquier pregunta que tenga. Document Revised: 10/03/2017 Document Reviewed: 10/03/2017 Elsevier Patient Education  2020 Elsevier Inc.  

## 2019-11-30 NOTE — Assessment & Plan Note (Signed)
Well-controlled hypertension.  Normal labs from September 2021 with normal renal function. Continue Zestoretic 20-12.5 mg daily. Continue atorvastatin 40 mg daily. Diet and nutrition discussed. Follow-up in 6 months.

## 2019-12-01 ENCOUNTER — Telehealth: Payer: Self-pay | Admitting: Emergency Medicine

## 2019-12-01 LAB — SAR COV2 SEROLOGY (COVID19)AB(IGG),IA
SARS-CoV-2 Semi-Quant IgG Ab: >800 AU/mL (ref <13.0)
SARS-CoV-2 Spike Ab Interp: POSITIVE

## 2019-12-01 NOTE — Telephone Encounter (Signed)
Covid antibody test result discussed with patient.

## 2020-05-31 ENCOUNTER — Other Ambulatory Visit: Payer: Self-pay

## 2020-05-31 ENCOUNTER — Ambulatory Visit (INDEPENDENT_AMBULATORY_CARE_PROVIDER_SITE_OTHER): Payer: 59 | Admitting: Emergency Medicine

## 2020-05-31 ENCOUNTER — Encounter: Payer: Self-pay | Admitting: Emergency Medicine

## 2020-05-31 VITALS — BP 160/100 | HR 67 | Temp 98.8°F | Ht 63.0 in | Wt 165.2 lb

## 2020-05-31 DIAGNOSIS — I10 Essential (primary) hypertension: Secondary | ICD-10-CM | POA: Diagnosis not present

## 2020-05-31 DIAGNOSIS — E785 Hyperlipidemia, unspecified: Secondary | ICD-10-CM | POA: Diagnosis not present

## 2020-05-31 DIAGNOSIS — E782 Mixed hyperlipidemia: Secondary | ICD-10-CM

## 2020-05-31 DIAGNOSIS — Z23 Encounter for immunization: Secondary | ICD-10-CM

## 2020-05-31 MED ORDER — ATORVASTATIN CALCIUM 40 MG PO TABS: 40.0000 mg | ORAL_TABLET | Freq: Every day | ORAL | 3 refills | Status: DC

## 2020-05-31 MED ORDER — LISINOPRIL-HYDROCHLOROTHIAZIDE 20-12.5 MG PO TABS: 1.0000 | ORAL_TABLET | Freq: Every day | ORAL | 3 refills | Status: DC

## 2020-05-31 NOTE — Assessment & Plan Note (Signed)
Uncontrolled hypertension.  Restart Zestoretic 20-12.5 mg daily. Diet and nutrition discussed. Follow-up in 3 months.

## 2020-05-31 NOTE — Progress Notes (Signed)
[address] [place] 55 y.o.   Chief Complaint  Patient presents with  . Hypertension    6 months follow up   . Medication Refill    Atorvastatin and Lisinopril patient Rx container is not Lisino/Hctz    HISTORY OF PRESENT ILLNESS: This is a 55 y.o. male with history of hypertension and dyslipidemia here for follow-up 1.  Hypertension: On lisinopril-HCTZ 20-12.5 mg daily but ran out of medication and started taking old lisinopril 20 mg medication previously prescribed by Dr. Leretha Pol. 2.  Dyslipidemia: On atorvastatin 40 mg daily. No other complaints or medical concerns today.  HPI   Prior to Admission medications   Medication Sig Start Date End Date Taking? Authorizing Provider  atorvastatin (LIPITOR) 40 MG tablet Take 1 tablet (40 mg total) by mouth daily. 09/17/19  Yes Lezlie Lye, Meda Coffee, MD  omeprazole (PRILOSEC) 20 MG capsule Take 1 capsule (20 mg total) by mouth 2 (two) times daily before a meal. 09/17/19  Yes Lezlie Lye, Meda Coffee, MD  cyclobenzaprine (FLEXERIL) 10 MG tablet Take 1 tablet (10 mg total) by mouth at bedtime as needed for muscle spasms. Patient not taking: Reported on 05/31/2020 09/17/19   Lezlie Lye, Meda Coffee, MD  lisinopril-hydrochlorothiazide (ZESTORETIC) 20-12.5 MG tablet Take 1 tablet by mouth daily. Patient not taking: Reported on 05/31/2020 09/17/19   Lezlie Lye, Meda Coffee, MD    Not on File  Patient Active Problem List   Diagnosis Date Noted  . BMI 30.0-30.9,adult 08/30/2015  . Dyslipidemia 08/30/2015  . Essential hypertension 10/11/2012    Past Medical History:  Diagnosis Date  . Bile duct leak   . Chronic kidney disease 2017   left kidney stones  . Headache(784.0)    Hx: of when BP is elevated  . Hyperlipidemia   . Hypertension   . Numbness and tingling in hands    Hx: of  . Prediabetes     Past Surgical History:  Procedure Laterality Date  . CHOLECYSTECTOMY N/A 07/11/2012   Procedure: LAPAROSCOPIC CHOLECYSTECTOMY WITH  INTRAOPERATIVE CHOLANGIOGRAM;  Surgeon: Axel Filler, MD;  Location: [facility] OR;  Service: General;  Laterality: N/A;  . COLONOSCOPY    . ERCP  07/16/2012  . ERCP N/A 07/16/2012   Procedure: ENDOSCOPIC RETROGRADE CHOLANGIOPANCREATOGRAPHY (ERCP);  Surgeon: Louis Meckel, MD;  Location[facility];  Service: Gastroenterology;  Laterality: N/A;  . ERCP N/A 10/06/2012   Procedure: ENDOSCOPIC RETROGRADE CHOLANGIOPANCREATOGRAPHY (ERCP);  Surgeon: Louis Meckel, MD;  Location: [facility];  Service: Endoscopy;  Laterality: N/A;    Social History   Socioeconomic History  . Marital status: Married    Spouse name: Davonna Belling  . Number of children: 3  . Years of education: 6th grade  . Highest education level: Not on file  Occupational History  . Occupation: Painting    Employer: [employer], [place], [place]  Tobacco Use  . Smoking status: Never Smoker  . Smokeless tobacco: Never Used  Substance and Sexual Activity  . Alcohol use: No  . Drug use: No  . Sexual activity: Yes    Partners: Female  Other Topics Concern  . Not on file  Social History Narrative   Originally from [place], [place]. Came to the [place] in 1991.   Lives with his wife and their sons.   His daughter lives in [place], [place] with her husband.   Social Determinants of Health   Financial Resource Strain: Not on file  Food Insecurity: Not on file  Transportation Needs: Not on file  Physical Activity: Not on file  Stress: Not on file  Social Connections: Not on file  Intimate Partner Violence: Not on file    Family History  Problem Relation Age of Onset  . Gallstones Mother   . Diabetes Paternal Uncle   . Gallstones Brother      Review of Systems  Constitutional: Negative.  Negative for chills and fever.  HENT: Negative.  Negative for congestion and sore throat.   Respiratory: Negative.  Negative for cough and shortness of breath.   Cardiovascular: Negative.  Negative for chest pain and leg  swelling.  Gastrointestinal: Negative for abdominal pain, diarrhea, nausea and vomiting.  Genitourinary: Negative.  Negative for dysuria and hematuria.  Skin: Negative.  Negative for rash.  Neurological: Negative.  Negative for dizziness and headaches.  All other systems reviewed and are negative.   Today's Vitals   05/31/20 1500  BP: (!) 160/100  Pulse: 67  Temp: 98.8 F (37.1 C)  TempSrc: Oral  SpO2: 97%  Weight: 165 lb 3.2 oz (74.9 kg)  Height: 5\' 3"  (1.6 m)   Body mass index is 29.26 kg/m. BP Readings from Last 3 Encounters:  05/31/20 (!) 160/100  11/30/19 125/73  09/17/19 136/78   Wt Readings from Last 3 Encounters:  05/31/20 165 lb 3.2 oz (74.9 kg)  11/30/19 161 lb (73 kg)  09/17/19 157 lb 6.4 oz (71.4 kg)    Physical Exam Vitals reviewed.  Constitutional:      Appearance: Normal appearance.  HENT:     Head: Normocephalic.     Mouth/Throat:     Mouth: Mucous membranes are moist.     Pharynx: Oropharynx is clear.  Eyes:     Extraocular Movements: Extraocular movements intact.     Conjunctiva/sclera: Conjunctivae normal.     Pupils: Pupils are equal, round, and reactive to light.  Cardiovascular:     Rate and Rhythm: Regular rhythm.     Pulses: Normal pulses.     Heart sounds: Normal heart sounds.  Pulmonary:     Effort: Pulmonary effort is normal.     Breath sounds: Normal breath sounds.  Musculoskeletal:        General: Normal range of motion.     Cervical back: Normal range of motion and neck supple.     Right lower leg: No edema.     Left lower leg: No edema.  Skin:    General: Skin is warm and dry.     Capillary Refill: Capillary refill takes less than 2 seconds.  Neurological:     General: No focal deficit present.     Mental Status: He is alert and oriented to person, place, and time.  Psychiatric:        Mood and Affect: Mood normal.        Behavior: Behavior normal.      ASSESSMENT & PLAN: A total of 30 minutes was spent with the  patient and counseling/coordination of care regarding hypertension and dyslipidemia and cardiovascular risks associated with these conditions, review of all medications and changes made, education on nutrition, review of most recent office visit notes, review of most recent blood work results, health maintenance items, prognosis, documentation, and need for follow-up.  Essential hypertension Uncontrolled hypertension.  Restart Zestoretic 20-12.5 mg daily. Diet and nutrition discussed. Follow-up in 3 months.  Dyslipidemia Diet and nutrition discussed.  Continue atorvastatin 40 mg daily.  Robel was seen today for hypertension and medication refill.  Diagnoses and all orders for this visit:  Essential hypertension -     lisinopril-hydrochlorothiazide (ZESTORETIC) 20-12.5 MG tablet; Take 1 tablet by mouth daily.  Need for shingles vaccine -     Varicella-zoster vaccine IM  Dyslipidemia  Mixed hyperlipidemia -     atorvastatin (LIPITOR) 40 MG tablet; Take 1 tablet (40 mg total) by mouth daily.    Patient Instructions   Hipertensin en los adultos Hypertension, Adult El trmino hipertensin es otra forma de denominar a la presin arterial elevada. La presin arterial elevada fuerza al corazn a trabajar ms para bombear la sangre. Esto puede causar problemas con el paso del Stottvilletiempo. Una lectura de presin arterial est compuesta por 2 nmeros. Hay un nmero superior (sistlico) sobre un nmero inferior (diastlico). Lo ideal es tener la presin arterial por debajo de 120/80. Las elecciones saludables pueden ayudar a Personal assistantbajar la presin arterial, o tal vez necesite medicamentos para bajarla. Cules son las causas? Se desconoce la causa de esta afeccin. Algunas afecciones pueden estar relacionadas con la presin arterial alta. Qu incrementa el riesgo?  Fumar.  Tener diabetes mellitus tipo 2, colesterol alto, o ambos.  No hacer la cantidad suficiente de actividad fsica o  ejercicio.  Tener sobrepeso.  Consumir mucha grasa, azcar, caloras o sal (sodio) en su dieta.  Beber alcohol en exceso.  Tener una enfermedad renal a largo plazo (crnica).  Tener antecedentes familiares de presin arterial alta.  Edad. Los riesgos aumentan con la edad.  Raza. El riesgo es mayor para las Statisticianpersonas afroamericanas.  Sexo. Antes de los 45aos, los hombres corren ms Goodyear Tireriesgo que las mujeres. Despus de los 65aos, las mujeres corren ms Lexmark Internationalriesgo que los hombres.  Tener apnea obstructiva del sueo.  Estrs. Cules son los signos o los sntomas?  Es posible que la presin arterial alta puede no cause sntomas. La presin arterial muy alta (crisis hipertensiva) puede provocar: ? Dolor de Turkmenistancabeza. ? Sensaciones de preocupacin o nerviosismo (ansiedad). ? Falta de aire. ? Hemorragia nasal. ? Sensacin de Journalist, newspapermalestar en el estmago (nuseas). ? Vmitos. ? Cambios en la forma de ver. ? Dolor muy intenso en el pecho. ? Convulsiones. Cmo se trata?  Esta afeccin se trata haciendo cambios saludables en el estilo de vida, por ejemplo: ? Consumir alimentos saludables. ? Hacer ms ejercicio. ? Beber menos alcohol.  El mdico puede recetarle medicamentos si los cambios en el estilo de vida no son suficientes para Museum/gallery curatorlograr controlar la presin arterial y si: ? El nmero de arriba est por encima de 130. ? El nmero de abajo est por encima de 80.  Su presin arterial personal ideal puede variar. Siga estas instrucciones en su casa: Comida y bebida  Si se lo dicen, siga el plan de alimentacin de DASH (Dietary Approaches to Stop Hypertension, Maneras de alimentarse para detener la hipertensin). Para seguir este plan: ? Llene la mitad del plato de cada comida con frutas y verduras. ? Llene un cuarto del plato de cada comida con cereales integrales. Los cereales integrales incluyen pasta integral, arroz integral y pan integral. ? Coma y beba productos lcteos con bajo  contenido de grasa, como leche descremada o yogur bajo en grasas. ? Llene un cuarto del plato de cada comida con protenas bajas en grasa (magras). Las protenas bajas en grasa incluyen pescado, pollo sin piel, huevos, frijoles y tofu. ? Evite consumir carne grasa, carne curada y procesada, o pollo con piel. ? Evite consumir alimentos prehechos o procesados.  Consuma menos de 1500 mg de sal por da.  No beba  alcohol si: ? El mdico le indica que no lo haga. ? Est embarazada, puede estar embarazada o est tratando de quedar embarazada.  Si bebe alcohol: ? Limite la cantidad que bebe a lo siguiente:  De 0 a 1 medida por da para las mujeres.  De 0 a 2 medidas por da para los hombres. ? Est atento a la cantidad de alcohol que hay en las bebidas que toma. En los [place], una medida equivale a una botella de cerveza de 12oz ( ), un vaso de vino de 5oz ( ) o un vaso de una bebida alcohlica de alta graduacin de 1oz (32ml).   Estilo de vida  Trabaje con su mdico para mantenerse en un peso saludable o para perder peso. Pregntele a su mdico cul es el peso recomendable para usted.  Haga al menos de ejercicio la [employer]V de la [place]. Estos pueden incluir caminar, nadar o andar en bicicleta.  Realice al menos 30 minutos de ejercicio que fortalezca sus msculos (ejercicios de resistencia) al menos 3 das a la [place]. Estos pueden incluir levantar pesas o hacer Pilates.  No consuma ningn producto que contenga nicotina o tabaco, como cigarrillos, cigarrillos electrnicos y tabaco de Theatre manager. Si necesita ayuda para dejar de fumar, consulte al [employer].  Controle su presin arterial en su casa tal como le indic el mdico.  Concurra a todas las visitas de seguimiento como se lo haya indicado el mdico. Esto es importante.   Medicamentos  [employer] de venta libre y los recetados solamente como se lo haya indicado el mdico. Siga  cuidadosamente las indicaciones.  No omita las dosis de medicamentos para la presin arterial. Los medicamentos pierden eficacia si omite dosis. El hecho de omitir las dosis tambin [place] el riesgo de otros problemas.  Pregntele a su mdico a qu efectos secundarios o reacciones a los Museum/gallery curator. Comunquese con un mdico si:  Piensa que tiene [place] reaccin a los medicamentos que est tomando.  Tiene dolores de cabeza frecuentes (recurrentes).  Se siente mareado.  Tiene hinchazn en los tobillos.  Tiene problemas de visin. Solicite ayuda inmediatamente si:  Siente un dolor de cabeza muy intenso.  Empieza a sentirse desorientado (confundido).  Se siente dbil o adormecido.  Siente que va a desmayarse.  Tiene un dolor muy intenso en las siguientes zonas: ? Pecho. ? Vientre (abdomen).  Vomita ms de una vez.  Tiene dificultad para respirar. Resumen  El trmino hipertensin es otra forma de denominar a la presin arterial elevada.  La presin arterial elevada fuerza al corazn a trabajar ms para bombear la sangre.  Para la [employer], una presin arterial normal es menor que 120/80.  Las decisiones saludables pueden ayudarle a disminuir su presin arterial. Si no puede bajar su presin arterial mediante decisiones saludables, es posible que deba tomar medicamentos. Esta informacin no tiene Theme park manager el consejo del mdico. Asegrese de hacerle al mdico cualquier pregunta que tenga. Document Revised: 10/03/2017 Document Reviewed: 10/03/2017 Elsevier Patient Education  2021 Elsevier Inc.     Edwina Barth, MD [facility]

## 2020-05-31 NOTE — Patient Instructions (Signed)
Hipertensin en los adultos Hypertension, Adult El trmino hipertensin es otra forma de denominar a la presin arterial elevada. La presin arterial elevada fuerza al corazn a trabajar ms para bombear la sangre. Esto puede causar problemas con el paso del tiempo. Una lectura de presin arterial est compuesta por 2 nmeros. Hay un nmero superior (sistlico) sobre un nmero inferior (diastlico). Lo ideal es tener la presin arterial por debajo de 120/80. Las elecciones saludables pueden ayudar a bajar la presin arterial, o tal vez necesite medicamentos para bajarla. Cules son las causas? Se desconoce la causa de esta afeccin. Algunas afecciones pueden estar relacionadas con la presin arterial alta. Qu incrementa el riesgo?  Fumar.  Tener diabetes mellitus tipo 2, colesterol alto, o ambos.  No hacer la cantidad suficiente de actividad fsica o ejercicio.  Tener sobrepeso.  Consumir mucha grasa, azcar, caloras o sal (sodio) en su dieta.  Beber alcohol en exceso.  Tener una enfermedad renal a largo plazo (crnica).  Tener antecedentes familiares de presin arterial alta.  Edad. Los riesgos aumentan con la edad.  Raza. El riesgo es mayor para las personas afroamericanas.  Sexo. Antes de los 45aos, los hombres corren ms riesgo que las mujeres. Despus de los 65aos, las mujeres corren ms riesgo que los hombres.  Tener apnea obstructiva del sueo.  Estrs. Cules son los signos o los sntomas?  Es posible que la presin arterial alta puede no cause sntomas. La presin arterial muy alta (crisis hipertensiva) puede provocar: ? Dolor de cabeza. ? Sensaciones de preocupacin o nerviosismo (ansiedad). ? Falta de aire. ? Hemorragia nasal. ? Sensacin de malestar en el estmago (nuseas). ? Vmitos. ? Cambios en la forma de ver. ? Dolor muy intenso en el pecho. ? Convulsiones. Cmo se trata?  Esta afeccin se trata haciendo cambios saludables en el estilo de  vida, por ejemplo: ? Consumir alimentos saludables. ? Hacer ms ejercicio. ? Beber menos alcohol.  El mdico puede recetarle medicamentos si los cambios en el estilo de vida no son suficientes para lograr controlar la presin arterial y si: ? El nmero de arriba est por encima de 130. ? El nmero de abajo est por encima de 80.  Su presin arterial personal ideal puede variar. Siga estas instrucciones en su casa: Comida y bebida  Si se lo dicen, siga el plan de alimentacin de DASH (Dietary Approaches to Stop Hypertension, Maneras de alimentarse para detener la hipertensin). Para seguir este plan: ? Llene la mitad del plato de cada comida con frutas y verduras. ? Llene un cuarto del plato de cada comida con cereales integrales. Los cereales integrales incluyen pasta integral, arroz integral y pan integral. ? Coma y beba productos lcteos con bajo contenido de grasa, como leche descremada o yogur bajo en grasas. ? Llene un cuarto del plato de cada comida con protenas bajas en grasa (magras). Las protenas bajas en grasa incluyen pescado, pollo sin piel, huevos, frijoles y tofu. ? Evite consumir carne grasa, carne curada y procesada, o pollo con piel. ? Evite consumir alimentos prehechos o procesados.  Consuma menos de 1500 mg de sal por da.  No beba alcohol si: ? El mdico le indica que no lo haga. ? Est embarazada, puede estar embarazada o est tratando de quedar embarazada.  Si bebe alcohol: ? Limite la cantidad que bebe a lo siguiente:  De 0 a 1 medida por da para las mujeres.  De 0 a 2 medidas por da para los hombres. ? Est atento a la   cantidad de alcohol que hay en las bebidas que toma. En los [place], una medida equivale a una botella de cerveza de 12oz (355ml), un vaso de vino de 5oz (148ml) o un vaso de una bebida alcohlica de alta graduacin de 1oz (44ml).   Estilo de vida  Trabaje con su mdico para mantenerse en un peso saludable o para perder  peso. Pregntele a su mdico cul es el peso recomendable para usted.  Haga al menos 30minutos de ejercicio la mayora de los das de la semana. Estos pueden incluir caminar, nadar o andar en bicicleta.  Realice al menos 30 minutos de ejercicio que fortalezca sus msculos (ejercicios de resistencia) al menos 3 das a la semana. Estos pueden incluir levantar pesas o hacer Pilates.  No consuma ningn producto que contenga nicotina o tabaco, como cigarrillos, cigarrillos electrnicos y tabaco de mascar. Si necesita ayuda para dejar de fumar, consulte al mdico.  Controle su presin arterial en su casa tal como le indic el mdico.  Concurra a todas las visitas de seguimiento como se lo haya indicado el mdico. Esto es importante.   Medicamentos  Tome los medicamentos de venta libre y los recetados solamente como se lo haya indicado el mdico. Siga cuidadosamente las indicaciones.  No omita las dosis de medicamentos para la presin arterial. Los medicamentos pierden eficacia si omite dosis. El hecho de omitir las dosis tambin aumenta el riesgo de otros problemas.  Pregntele a su mdico a qu efectos secundarios o reacciones a los medicamentos debe prestar atencin. Comunquese con un mdico si:  Piensa que tiene una reaccin a los medicamentos que est tomando.  Tiene dolores de cabeza frecuentes (recurrentes).  Se siente mareado.  Tiene hinchazn en los tobillos.  Tiene problemas de visin. Solicite ayuda inmediatamente si:  Siente un dolor de cabeza muy intenso.  Empieza a sentirse desorientado (confundido).  Se siente dbil o adormecido.  Siente que va a desmayarse.  Tiene un dolor muy intenso en las siguientes zonas: ? Pecho. ? Vientre (abdomen).  Vomita ms de una vez.  Tiene dificultad para respirar. Resumen  El trmino hipertensin es otra forma de denominar a la presin arterial elevada.  La presin arterial elevada fuerza al corazn a trabajar ms para  bombear la sangre.  Para la mayora de las personas, una presin arterial normal es menor que 120/80.  Las decisiones saludables pueden ayudarle a disminuir su presin arterial. Si no puede bajar su presin arterial mediante decisiones saludables, es posible que deba tomar medicamentos. Esta informacin no tiene como fin reemplazar el consejo del mdico. Asegrese de hacerle al mdico cualquier pregunta que tenga. Document Revised: 10/03/2017 Document Reviewed: 10/03/2017 Elsevier Patient Education  2021 Elsevier Inc.  

## 2020-05-31 NOTE — Assessment & Plan Note (Signed)
Diet and nutrition discussed. Continue atorvastatin 40 mg daily. 

## 2020-07-13 ENCOUNTER — Inpatient Hospital Stay (HOSPITAL_COMMUNITY)
Admission: EM | Admit: 2020-07-13 | Discharge: 2020-07-15 | DRG: 282 | Disposition: A | Discharge status: Home / Self Care | Payer: 59 | Attending: Cardiovascular Disease | Admitting: Cardiovascular Disease

## 2020-07-13 ENCOUNTER — Emergency Department (HOSPITAL_COMMUNITY): Payer: 59

## 2020-07-13 ENCOUNTER — Encounter (HOSPITAL_COMMUNITY): Payer: Self-pay

## 2020-07-13 DIAGNOSIS — N189 Chronic kidney disease, unspecified: Secondary | ICD-10-CM | POA: Diagnosis present

## 2020-07-13 DIAGNOSIS — I214 Non-ST elevation (NSTEMI) myocardial infarction: Secondary | ICD-10-CM | POA: Diagnosis not present

## 2020-07-13 DIAGNOSIS — I129 Hypertensive chronic kidney disease with stage 1 through stage 4 chronic kidney disease, or unspecified chronic kidney disease: Secondary | ICD-10-CM | POA: Diagnosis present

## 2020-07-13 DIAGNOSIS — I25119 Atherosclerotic heart disease of native coronary artery with unspecified angina pectoris: Secondary | ICD-10-CM

## 2020-07-13 DIAGNOSIS — I10 Essential (primary) hypertension: Secondary | ICD-10-CM | POA: Diagnosis present

## 2020-07-13 DIAGNOSIS — I2510 Atherosclerotic heart disease of native coronary artery without angina pectoris: Secondary | ICD-10-CM | POA: Diagnosis present

## 2020-07-13 DIAGNOSIS — Z79899 Other long term (current) drug therapy: Secondary | ICD-10-CM

## 2020-07-13 DIAGNOSIS — I160 Hypertensive urgency: Secondary | ICD-10-CM | POA: Diagnosis present

## 2020-07-13 DIAGNOSIS — I248 Other forms of acute ischemic heart disease: Secondary | ICD-10-CM | POA: Diagnosis present

## 2020-07-13 DIAGNOSIS — Z20822 Contact with and (suspected) exposure to covid-19: Secondary | ICD-10-CM | POA: Diagnosis present

## 2020-07-13 DIAGNOSIS — Z833 Family history of diabetes mellitus: Secondary | ICD-10-CM

## 2020-07-13 DIAGNOSIS — R7303 Prediabetes: Secondary | ICD-10-CM | POA: Diagnosis present

## 2020-07-13 DIAGNOSIS — E785 Hyperlipidemia, unspecified: Secondary | ICD-10-CM | POA: Diagnosis present

## 2020-07-13 DIAGNOSIS — Z6830 Body mass index (BMI) 30.0-30.9, adult: Secondary | ICD-10-CM

## 2020-07-13 HISTORY — DX: Non-ST elevation (NSTEMI) myocardial infarction: I21.4

## 2020-07-13 LAB — CBC
HCT: 51 % (ref 39.0–52.0)
Hemoglobin: 17.3 g/dL — ABNORMAL HIGH (ref 13.0–17.0)
MCH: 30.5 pg (ref 26.0–34.0)
MCHC: 33.9 g/dL (ref 30.0–36.0)
MCV: 89.8 fL (ref 80.0–100.0)
Platelets: 214 10*3/uL (ref 150–400)
RBC: 5.68 MIL/uL (ref 4.22–5.81)
RDW: 13.3 % (ref 11.5–15.5)
WBC: 7.3 10*3/uL (ref 4.0–10.5)
nRBC: 0 % (ref 0.0–0.2)

## 2020-07-13 LAB — BASIC METABOLIC PANEL
Anion gap: 9 (ref 5–15)
BUN: 21 mg/dL — ABNORMAL HIGH (ref 6–20)
CO2: 25 mmol/L (ref 22–32)
Calcium: 9.3 mg/dL (ref 8.9–10.3)
Chloride: 106 mmol/L (ref 98–111)
Creatinine, Ser: 0.88 mg/dL (ref 0.61–1.24)
GFR, Estimated: >60 mL/min (ref >60)
Glucose, Bld: 96 mg/dL (ref 70–99)
Potassium: 3.9 mmol/L (ref 3.5–5.1)
Sodium: 140 mmol/L (ref 135–145)

## 2020-07-13 LAB — TROPONIN I (HIGH SENSITIVITY)
Troponin I (High Sensitivity): 75 ng/L — ABNORMAL HIGH (ref <18)
Troponin I (High Sensitivity): 526 ng/L (ref <18)

## 2020-07-13 NOTE — ED Provider Notes (Signed)
DeniesEmergency Medicine Provider Triage Evaluation Note  Lorik Guo , a 55 y.o. male  was evaluated in triage.  Pt complains of patient presents with headaches, chest palpitations he states that today he noted that his blood pressure was elevated, so developed a headache as well as pain in the back of his shoulders then developed chest pain in the center of his chest, radiating to his left jaw, did not become diaphoretic, nauseous, vomiting, denies shortness of breath.  He has no significant cardiac history, no history of PEs or DVTs, illicit drug use.  Patient states he has been taking his blood pressure as prescribed, he has no history of strokes, denies recent head trauma, not on anticoagulant..  Review of Systems  Positive: Headaches, chest pain Negative: Nausea, vomiting  Physical Exam  BP (!) 180/119 (BP Location: Right Arm)   Pulse 78   Temp 98.3 F (36.8 C) (Oral)   Resp 18   SpO2 96%  Gen:   Awake, no distress   Resp:  Normal effort  MSK:   Moves extremities without difficulty  Other:  No difficulty with word finding, face was symmetric, no unilateral weakness, patient was able to follow commands  Medical Decision Making  Medically screening exam initiated at 8:16 PM.  Appropriate orders placed.  [facility] was informed that the remainder of the evaluation will be completed by another provider, this initial triage assessment does not replace that evaluation, and the importance of remaining in the ED until their evaluation is complete.  Patient presents with chest pain and headache, lab work, imaging have been ordered, patient will need further work-up.   Carroll Sage, PA-C 07/13/20 2017    Derwood Kaplan, MD 07/13/20 [id number]

## 2020-07-13 NOTE — ED Triage Notes (Signed)
Pt states that he has been having bilateral shoulder pain, CP, nausea and dizziness, denies SOB

## 2020-07-14 ENCOUNTER — Other Ambulatory Visit: Payer: Self-pay

## 2020-07-14 ENCOUNTER — Encounter (HOSPITAL_COMMUNITY)
Admission: EM | Disposition: A | Discharge status: Home / Self Care | Payer: Self-pay | Source: Home / Self Care | Attending: Cardiovascular Disease

## 2020-07-14 ENCOUNTER — Encounter (HOSPITAL_COMMUNITY): Payer: Self-pay | Admitting: Student

## 2020-07-14 ENCOUNTER — Inpatient Hospital Stay (HOSPITAL_COMMUNITY): Payer: 59

## 2020-07-14 DIAGNOSIS — Z20822 Contact with and (suspected) exposure to covid-19: Secondary | ICD-10-CM | POA: Diagnosis present

## 2020-07-14 DIAGNOSIS — Z79899 Other long term (current) drug therapy: Secondary | ICD-10-CM | POA: Diagnosis not present

## 2020-07-14 DIAGNOSIS — Z833 Family history of diabetes mellitus: Secondary | ICD-10-CM | POA: Diagnosis not present

## 2020-07-14 DIAGNOSIS — E785 Hyperlipidemia, unspecified: Secondary | ICD-10-CM

## 2020-07-14 DIAGNOSIS — I248 Other forms of acute ischemic heart disease: Secondary | ICD-10-CM | POA: Diagnosis present

## 2020-07-14 DIAGNOSIS — I214 Non-ST elevation (NSTEMI) myocardial infarction: Secondary | ICD-10-CM | POA: Diagnosis present

## 2020-07-14 DIAGNOSIS — I25118 Atherosclerotic heart disease of native coronary artery with other forms of angina pectoris: Secondary | ICD-10-CM | POA: Diagnosis not present

## 2020-07-14 DIAGNOSIS — I10 Essential (primary) hypertension: Secondary | ICD-10-CM

## 2020-07-14 DIAGNOSIS — R7303 Prediabetes: Secondary | ICD-10-CM | POA: Diagnosis present

## 2020-07-14 DIAGNOSIS — N189 Chronic kidney disease, unspecified: Secondary | ICD-10-CM | POA: Diagnosis present

## 2020-07-14 DIAGNOSIS — R079 Chest pain, unspecified: Secondary | ICD-10-CM

## 2020-07-14 DIAGNOSIS — I160 Hypertensive urgency: Secondary | ICD-10-CM | POA: Diagnosis present

## 2020-07-14 DIAGNOSIS — I2510 Atherosclerotic heart disease of native coronary artery without angina pectoris: Secondary | ICD-10-CM

## 2020-07-14 DIAGNOSIS — I129 Hypertensive chronic kidney disease with stage 1 through stage 4 chronic kidney disease, or unspecified chronic kidney disease: Secondary | ICD-10-CM | POA: Diagnosis present

## 2020-07-14 HISTORY — PX: LEFT HEART CATH AND CORONARY ANGIOGRAPHY: CATH118249

## 2020-07-14 HISTORY — DX: Atherosclerotic heart disease of native coronary artery without angina pectoris: I25.10

## 2020-07-14 HISTORY — PX: TRANSTHORACIC ECHOCARDIOGRAM: SHX275

## 2020-07-14 LAB — BASIC METABOLIC PANEL
Anion gap: 8 (ref 5–15)
BUN: 19 mg/dL (ref 6–20)
CO2: 25 mmol/L (ref 22–32)
Calcium: 8.5 mg/dL — ABNORMAL LOW (ref 8.9–10.3)
Chloride: 104 mmol/L (ref 98–111)
Creatinine, Ser: 0.88 mg/dL (ref 0.61–1.24)
GFR, Estimated: >60 mL/min (ref >60)
Glucose, Bld: 94 mg/dL (ref 70–99)
Potassium: 3.6 mmol/L (ref 3.5–5.1)
Sodium: 137 mmol/L (ref 135–145)

## 2020-07-14 LAB — CBC
HCT: 51.2 % (ref 39.0–52.0)
Hemoglobin: 16.6 g/dL (ref 13.0–17.0)
MCH: 29.8 pg (ref 26.0–34.0)
MCHC: 32.4 g/dL (ref 30.0–36.0)
MCV: 91.9 fL (ref 80.0–100.0)
Platelets: 186 10*3/uL (ref 150–400)
RBC: 5.57 MIL/uL (ref 4.22–5.81)
RDW: 13.4 % (ref 11.5–15.5)
WBC: 7.3 10*3/uL (ref 4.0–10.5)
nRBC: 0 % (ref 0.0–0.2)

## 2020-07-14 LAB — ECHOCARDIOGRAM COMPLETE
AR max vel: 1.64 cm2
AV Area VTI: 1.73 cm2
AV Area mean vel: 1.65 cm2
AV Mean grad: 10 mmHg
AV Peak grad: 19 mmHg
Ao pk vel: 2.18 m/s
Area-P 1/2: 3.99 cm2
Height: 63 in
S' Lateral: 3.1 cm
Weight: 2576 oz

## 2020-07-14 LAB — RESP PANEL BY RT-PCR (FLU A&B, COVID) ARPGX2
Influenza A by PCR: NEGATIVE
Influenza B by PCR: NEGATIVE
SARS Coronavirus 2 by RT PCR: NEGATIVE

## 2020-07-14 LAB — HEMOGLOBIN A1C
Hgb A1c MFr Bld: 5.9 % — ABNORMAL HIGH (ref 4.8–5.6)
Mean Plasma Glucose: 122.63 mg/dL

## 2020-07-14 LAB — TROPONIN I (HIGH SENSITIVITY)
Troponin I (High Sensitivity): 563 ng/L (ref <18)
Troponin I (High Sensitivity): 516 ng/L (ref <18)

## 2020-07-14 LAB — LIPID PANEL
Cholesterol: 162 mg/dL (ref 0–200)
HDL: 40 mg/dL — ABNORMAL LOW (ref >40)
LDL Cholesterol: 115 mg/dL — ABNORMAL HIGH (ref 0–99)
Total CHOL/HDL Ratio: 4.1 RATIO
Triglycerides: 33 mg/dL (ref <150)
VLDL: 7 mg/dL (ref 0–40)

## 2020-07-14 LAB — HEPARIN LEVEL (UNFRACTIONATED): Heparin Unfractionated: 0.36 IU/mL (ref 0.30–0.70)

## 2020-07-14 LAB — HIV ANTIBODY (ROUTINE TESTING W REFLEX): HIV Screen 4th Generation wRfx: NONREACTIVE

## 2020-07-14 SURGERY — LEFT HEART CATH AND CORONARY ANGIOGRAPHY
Anesthesia: LOCAL

## 2020-07-14 MED ORDER — NITROGLYCERIN 0.4 MG SL SUBL: 0.4000 mg | SUBLINGUAL_TABLET | SUBLINGUAL | Status: DC | PRN

## 2020-07-14 MED ORDER — CLOPIDOGREL BISULFATE 75 MG PO TABS
300.0000 mg | ORAL_TABLET | Freq: Once | ORAL | Status: AC
  Administered 2020-07-14: 300 mg via ORAL
  Filled 2020-07-14: qty 4

## 2020-07-14 MED ORDER — FENTANYL CITRATE (PF) 100 MCG/2ML IJ SOLN
INTRAMUSCULAR | Status: AC
  Filled 2020-07-14: qty 2

## 2020-07-14 MED ORDER — ENOXAPARIN SODIUM 40 MG/0.4ML IJ SOSY
40.0000 mg | PREFILLED_SYRINGE | INTRAMUSCULAR | Status: DC
  Administered 2020-07-15: 40 mg via SUBCUTANEOUS
  Filled 2020-07-14: qty 0.4

## 2020-07-14 MED ORDER — LABETALOL HCL 5 MG/ML IV SOLN: 10.0000 mg | INTRAVENOUS | Status: AC | PRN

## 2020-07-14 MED ORDER — SODIUM CHLORIDE 0.9 % IV SOLN: INTRAVENOUS | Status: AC

## 2020-07-14 MED ORDER — VERAPAMIL HCL 2.5 MG/ML IV SOLN
INTRAVENOUS | Status: AC
  Filled 2020-07-14: qty 2

## 2020-07-14 MED ORDER — CLOPIDOGREL BISULFATE 75 MG PO TABS: 75.0000 mg | ORAL_TABLET | Freq: Every day | ORAL | Status: DC

## 2020-07-14 MED ORDER — MIDAZOLAM HCL 2 MG/2ML IJ SOLN
INTRAMUSCULAR | Status: AC
  Filled 2020-07-14: qty 2

## 2020-07-14 MED ORDER — ACETAMINOPHEN 325 MG PO TABS: 650.0000 mg | ORAL_TABLET | ORAL | Status: DC | PRN

## 2020-07-14 MED ORDER — SODIUM CHLORIDE 0.9 % WEIGHT BASED INFUSION
1.0000 mL/kg/h | INTRAVENOUS | Status: DC
  Administered 2020-07-14: 1 mL/kg/h via INTRAVENOUS

## 2020-07-14 MED ORDER — HEPARIN (PORCINE) IN NACL 1000-0.9 UT/500ML-% IV SOLN
INTRAVENOUS | Status: AC
  Filled 2020-07-14: qty 1000

## 2020-07-14 MED ORDER — LISINOPRIL 20 MG PO TABS
20.0000 mg | ORAL_TABLET | Freq: Every day | ORAL | Status: DC
  Administered 2020-07-14 – 2020-07-15 (×2): 20 mg via ORAL
  Filled 2020-07-14 (×2): qty 1

## 2020-07-14 MED ORDER — LISINOPRIL-HYDROCHLOROTHIAZIDE 20-12.5 MG PO TABS: 1.0000 | ORAL_TABLET | Freq: Every day | ORAL | Status: DC

## 2020-07-14 MED ORDER — SODIUM CHLORIDE 0.9 % IV SOLN: 250.0000 mL | INTRAVENOUS | Status: DC | PRN

## 2020-07-14 MED ORDER — VERAPAMIL HCL 2.5 MG/ML IV SOLN
INTRAVENOUS | Status: DC | PRN
  Administered 2020-07-14: 10 mL via INTRA_ARTERIAL

## 2020-07-14 MED ORDER — HEPARIN SODIUM (PORCINE) 1000 UNIT/ML IJ SOLN
INTRAMUSCULAR | Status: AC
  Filled 2020-07-14: qty 1

## 2020-07-14 MED ORDER — HEPARIN (PORCINE) 25000 UT/250ML-% IV SOLN
900.0000 [IU]/h | INTRAVENOUS | Status: DC
  Administered 2020-07-14: 900 [IU]/h via INTRAVENOUS
  Filled 2020-07-14: qty 250

## 2020-07-14 MED ORDER — LIDOCAINE HCL (PF) 1 % IJ SOLN
INTRAMUSCULAR | Status: DC | PRN
  Administered 2020-07-14: 2 mL

## 2020-07-14 MED ORDER — METOPROLOL TARTRATE 12.5 MG HALF TABLET
12.5000 mg | ORAL_TABLET | Freq: Two times a day (BID) | ORAL | Status: DC
  Administered 2020-07-14 – 2020-07-15 (×3): 12.5 mg via ORAL
  Filled 2020-07-14 (×3): qty 1

## 2020-07-14 MED ORDER — CLOPIDOGREL BISULFATE 75 MG PO TABS
75.0000 mg | ORAL_TABLET | Freq: Every day | ORAL | Status: DC
  Administered 2020-07-15: 75 mg via ORAL
  Filled 2020-07-14: qty 1

## 2020-07-14 MED ORDER — SODIUM CHLORIDE 0.9% FLUSH: 3.0000 mL | INTRAVENOUS | Status: DC | PRN

## 2020-07-14 MED ORDER — SODIUM CHLORIDE 0.9% FLUSH: 3.0000 mL | Freq: Two times a day (BID) | INTRAVENOUS | Status: DC

## 2020-07-14 MED ORDER — IOHEXOL 350 MG/ML SOLN
INTRAVENOUS | Status: DC | PRN
  Administered 2020-07-14: 55 mL

## 2020-07-14 MED ORDER — HEPARIN SODIUM (PORCINE) 1000 UNIT/ML IJ SOLN
INTRAMUSCULAR | Status: DC | PRN
  Administered 2020-07-14: 3500 [IU] via INTRAVENOUS

## 2020-07-14 MED ORDER — MIDAZOLAM HCL 2 MG/2ML IJ SOLN
INTRAMUSCULAR | Status: DC | PRN
  Administered 2020-07-14: 1 mg via INTRAVENOUS

## 2020-07-14 MED ORDER — LIDOCAINE HCL (PF) 1 % IJ SOLN
INTRAMUSCULAR | Status: AC
  Filled 2020-07-14: qty 30

## 2020-07-14 MED ORDER — HEPARIN BOLUS VIA INFUSION
4000.0000 [IU] | Freq: Once | INTRAVENOUS | Status: AC
  Administered 2020-07-14: 4000 [IU] via INTRAVENOUS
  Filled 2020-07-14: qty 4000

## 2020-07-14 MED ORDER — ACETAMINOPHEN 325 MG PO TABS
650.0000 mg | ORAL_TABLET | ORAL | Status: DC | PRN
  Administered 2020-07-14 (×2): 650 mg via ORAL
  Filled 2020-07-14 (×2): qty 2

## 2020-07-14 MED ORDER — HEPARIN (PORCINE) IN NACL 1000-0.9 UT/500ML-% IV SOLN
INTRAVENOUS | Status: DC | PRN
  Administered 2020-07-14: 500 mL

## 2020-07-14 MED ORDER — HYDROCHLOROTHIAZIDE 12.5 MG PO CAPS
12.5000 mg | ORAL_CAPSULE | Freq: Every day | ORAL | Status: DC
  Administered 2020-07-14 – 2020-07-15 (×2): 12.5 mg via ORAL
  Filled 2020-07-14 (×2): qty 1

## 2020-07-14 MED ORDER — ASPIRIN 81 MG PO CHEW: 81.0000 mg | CHEWABLE_TABLET | Freq: Every day | ORAL | Status: DC

## 2020-07-14 MED ORDER — ASPIRIN 81 MG PO CHEW: 81.0000 mg | CHEWABLE_TABLET | ORAL | Status: AC

## 2020-07-14 MED ORDER — HYDRALAZINE HCL 20 MG/ML IJ SOLN: 10.0000 mg | INTRAMUSCULAR | Status: AC | PRN

## 2020-07-14 MED ORDER — SODIUM CHLORIDE 0.9 % WEIGHT BASED INFUSION
3.0000 mL/kg/h | INTRAVENOUS | Status: DC
  Administered 2020-07-14: 3 mL/kg/h via INTRAVENOUS

## 2020-07-14 MED ORDER — FENTANYL CITRATE (PF) 100 MCG/2ML IJ SOLN
INTRAMUSCULAR | Status: DC | PRN
  Administered 2020-07-14: 25 ug via INTRAVENOUS

## 2020-07-14 MED ORDER — ASPIRIN EC 81 MG PO TBEC
81.0000 mg | DELAYED_RELEASE_TABLET | Freq: Every day | ORAL | Status: DC
  Administered 2020-07-14 – 2020-07-15 (×2): 81 mg via ORAL
  Filled 2020-07-14 (×2): qty 1

## 2020-07-14 MED ORDER — ONDANSETRON HCL 4 MG/2ML IJ SOLN: 4.0000 mg | Freq: Four times a day (QID) | INTRAMUSCULAR | Status: DC | PRN

## 2020-07-14 MED ORDER — ATORVASTATIN CALCIUM 80 MG PO TABS
80.0000 mg | ORAL_TABLET | Freq: Every day | ORAL | Status: DC
  Administered 2020-07-15: 80 mg via ORAL
  Filled 2020-07-14: qty 1

## 2020-07-14 MED ORDER — PANTOPRAZOLE SODIUM 40 MG PO TBEC
40.0000 mg | DELAYED_RELEASE_TABLET | Freq: Every day | ORAL | Status: DC
  Administered 2020-07-14 – 2020-07-15 (×2): 40 mg via ORAL
  Filled 2020-07-14 (×2): qty 1

## 2020-07-14 MED ORDER — ATORVASTATIN CALCIUM 40 MG PO TABS: 40.0000 mg | ORAL_TABLET | Freq: Every day | ORAL | Status: DC

## 2020-07-14 MED ORDER — SODIUM CHLORIDE 0.9% FLUSH
3.0000 mL | Freq: Two times a day (BID) | INTRAVENOUS | Status: DC
  Administered 2020-07-14 (×2): 3 mL via INTRAVENOUS

## 2020-07-14 SURGICAL SUPPLY — 12 items
CATH 5FR JL3.5 JR4 ANG PIG MP (CATHETERS) ×1 IMPLANT
DEVICE RAD COMP TR BAND LRG (VASCULAR PRODUCTS) ×1 IMPLANT
GLIDESHEATH SLEND A-KIT 6F 22G (SHEATH) ×1 IMPLANT
GLIDESHEATH SLEND SS 6F .021 (SHEATH) IMPLANT
GUIDEWIRE INQWIRE 1.5J.035X260 (WIRE) IMPLANT
INQWIRE 1.5J .035X260CM (WIRE) ×2
KIT HEART LEFT (KITS) ×2 IMPLANT
PACK CARDIAC CATHETERIZATION (CUSTOM PROCEDURE TRAY) ×2 IMPLANT
SHEATH PROBE COVER 6X72 (BAG) ×1 IMPLANT
TRANSDUCER W/STOPCOCK (MISCELLANEOUS) ×2 IMPLANT
TUBING CIL FLEX 10 FLL-RA (TUBING) ×2 IMPLANT
WIRE HI TORQ VERSACORE J 260CM (WIRE) IMPLANT

## 2020-07-14 NOTE — H&P (Signed)
Cardiology Admission History and Physical:   Patient ID: Ethan Silva MRN: [id number]; DOB: 16-Feb-1965   Admission date: 07/13/2020  PCP:  Georgina QuintSagardia, Miguel Eva, MD   [facility] Prov[facility] Cardiologist:  None        Chief Complaint:  Chest pain  Patient Profile:   Ethan Silva is a 55 y.o. male with hypertension and hyperlipidemia who is being seen 07/14/2020 for the evaluation of chest pain.  History of Present Illness:   Ethan Silva is a 55 year old male with history of hypertension, hyperlipidemia, prior bile duct leak following cholecystectomy who presented for evaluation of chest pain.  He was in his usual state of health until this evening around 6 PM when he developed substernal chest pain and pressure that lasted about an hour and resolved uneventfully.  He noted that his blood pressure was elevated to 190 systolic in the context of this.  He did not require nitroglycerin.  He is chest pain free at this time.  He denies shortness of breath, orthopnea, PND, palpitations, cough, fever, chills.  He has no prior cardiovascular history though he does endorse unclear leg weakness with prolonged ambulation that improves with rest in his lower extremities, worse on the right.  ECG without acute ischemia.  Troponin trended 75 -> 526.   Past Medical History:  Diagnosis Date   Bile duct leak    Chronic kidney disease 2017   left kidney stones   Headache(784.0)    Hx: of when BP is elevated   Hyperlipidemia    Hypertension    Numbness and tingling in hands    Hx: of   Prediabetes     Past Surgical History:  Procedure Laterality Date   CHOLECYSTECTOMY N/A 07/11/2012   Procedure: LAPAROSCOPIC CHOLECYSTECTOMY WITH INTRAOPERATIVE CHOLANGIOGRAM;  Surgeon: Axel FillerArmando Ramirez, MD;  Location: [facility] OR;  Service: General;  Laterality: N/A;   COLONOSCOPY     ERCP  07/16/2012   ERCP N/A 07/16/2012   Procedure: ENDOSCOPIC RETROGRADE CHOLANGIOPANCREATOGRAPHY  (ERCP);  Surgeon: Louis Meckelobert D Kaplan, MD;  [facility];  Service: Gastroenterology;  Laterality: N/A;   ERCP N/A 10/06/2012   Procedure: ENDOSCOPIC RETROGRADE CHOLANGIOPANCREATOGRAPHY (ERCP);  Surgeon: Louis Meckelobert D Kaplan, MD;  [facility];  Service: Endoscopy;  Laterality: N/A;     Medications Prior to Admission: Prior to Admission medications   Medication Sig Start Date End Date Taking? Authorizing Provider  atorvastatin (LIPITOR) 40 MG tablet Take 1 tablet (40 mg total) by mouth daily. 05/31/20  Yes [name], Eilleen KempfMiguel Trevor, MD  lisinopril-hydrochlorothiazide (ZESTORETIC) 20-12.5 MG tablet Take 1 tablet by mouth daily. 05/31/20  Yes Georgina QuintSagardia, Miguel Colby, MD  omeprazole (PRILOSEC) 20 MG capsule Take 1 capsule (20 mg total) by mouth 2 (two) times daily before a meal. 09/17/19  Yes Lezlie LyeSantiago Lago, Meda CoffeeIrma M, MD  cyclobenzaprine (FLEXERIL) 10 MG tablet Take 1 tablet (10 mg total) by mouth at bedtime as needed for muscle spasms. Patient not taking: No sig reported 09/17/19   Lezlie LyeSantiago Lago, Meda CoffeeIrma M, MD     Allergies:   No Known Allergies  Social History:   Social History   Socioeconomic History   Marital status: Married    Spouse name: Davonna BellingMargarita   Number of children: 3   Years of education: 6th grade   Highest education level: Not on file  Occupational History   Occupation: Painting    Employer: [employer], [place], [place]  Tobacco Use   Smoking status: Never   Smokeless tobacco: Never  Substance and Sexual Activity   Alcohol use: No   Drug use: No   Sexual activity: Yes    Partners: Female  Other Topics Concern   Not on file  Social History Narrative   Originally from [place], [place]. Came to the [place] in 1991.   Lives with his wife and their sons.   His daughter lives in [place], [place] with her husband.   Social Determinants of Health   Financial Resource Strain: Not on file  Food Insecurity: Not on file  Transportation Needs: Not on file  Physical  Activity: Not on file  Stress: Not on file  Social Connections: Not on file  Intimate Partner Violence: Not on file    Family History:   The patient's family history includes Diabetes in his paternal uncle; Gallstones in his brother and mother.   No family history premature ASCVD  ROS:  Please see the history of present illness.  All other ROS reviewed and negative.     Physical Exam/Data:   Vitals:   07/14/20 0230 07/14/20 0300 07/14/20 0315 07/14/20 0353  BP: (!) 145/88 (!) 156/95 (!) 129/93 (!) 163/98  Pulse: 61 (!) 53 (!) 53 (!) 58  Resp: 17 18 (!) 26 16  Temp:    98 F (36.7 C)  TempSrc:    Oral  SpO2: 95% 96% 93%   Weight:    73 kg  Height:    5\' 3"  (1.6 m)    Intake/Output Summary (Last 24 hours) at 07/14/2020 0405 Last data filed at 07/14/2020 0302 Gross per 24 hour  Intake 56.61 ml  Output --  Net 56.61 ml   Last 3 Weights 07/14/2020 05/31/2020 11/30/2019  Weight (lbs) 161 lb 165 lb 3.2 oz 161 lb  Weight (kg) 73.029 kg 74.934 kg 73.029 kg     Body mass index is 28.52 kg/m.  General:  Well nourished, well developed, in no acute distress HEENT: normal Lymph: no adenopathy Neck: No JVD Endocrine:  No thryomegaly Vascular: No carotid bruits; FA pulses 2+ bilaterally without bruits  Cardiac:  normal S1, S2; RRR; no murmur  Lungs:  clear to auscultation bilaterally, no wheezing, rhonchi or rales  Abd: soft, nontender, no hepatomegaly  Ext: no edema, hairless in length dependent fashion, 2+ L DP/PT, faint R PT, non-palpable R DP. Musculoskeletal:  No deformities, BUE and BLE strength normal and equal Skin: warm and dry  Neuro:  CNs 2-12 intact, no focal abnormalities noted Psych:  Normal affect   EKG:  The ECG that was done today was personally reviewed and demonstrates normal sinus rhythm, normal ECG  Relevant CV Studies: None  Laboratory Data:  High Sensitivity Troponin:   Recent Labs  Lab 07/13/20 2017 07/13/20 2239  TROPONINIHS 75* 526*       Chemistry Recent Labs  Lab 07/13/20 2017  NA 140  K 3.9  CL 106  CO2 25  GLUCOSE 96  BUN 21*  CREATININE 0.88  CALCIUM 9.3  GFRNONAA >60  ANIONGAP 9    No results for input(s): PROT, ALBUMIN, AST, ALT, ALKPHOS, BILITOT in the last 168 hours. Hematology Recent Labs  Lab 07/13/20 2017  WBC 7.3  RBC 5.68  HGB 17.3*  HCT 51.0  MCV 89.8  MCH 30.5  MCHC 33.9  RDW 13.3  PLT 214   BNPNo results for input(s): BNP, PROBNP in the last 168 hours.  DDimer No results for input(s): DDIMER in the last 168 hours.   Radiology/Studies:  DG Chest 2 View  Result Date: 07/13/2020 CLINICAL DATA:  55 year old male with chest pain. EXAM: CHEST - 2 VIEW COMPARISON:  Chest radiograph dated 08/27/2019. FINDINGS: No focal consolidation, pleural effusion, or pneumothorax. The cardiac silhouette is within limits. No acute osseous pathology. IMPRESSION: No active cardiopulmonary disease. Electronically Signed   By: Elgie Collard M.D.   On: 07/13/2020 20:34   CT Head Wo Contrast  Result Date: 07/13/2020 CLINICAL DATA:  55 year old male with chronic headache. EXAM: CT HEAD WITHOUT CONTRAST TECHNIQUE: Contiguous axial images were obtained from the base of the skull through the vertex without intravenous contrast. COMPARISON:  None. FINDINGS: Brain: The ventricles and sulci are appropriate size for patient's age. The gray-white matter discrimination is preserved. There is no acute intracranial hemorrhage. No mass effect or midline shift. No extra-axial fluid collection. Several scattered small calcific foci, likely sequela of prior insult. A 6 mm left occipital dural based calcific focus may represent a small calcified meningioma. Vascular: No hyperdense vessel or unexpected calcification. Skull: Normal. Negative for fracture or focal lesion. Sinuses/Orbits: There is partial opacification of the right maxillary sinus. No air-fluid level. The mastoid air cells are clear. Partially visualized tooth in the  left maxilla. Other: None IMPRESSION: No acute intracranial pathology. Electronically Signed   By: Elgie Collard M.D.   On: 07/13/2020 21:10     Assessment and Plan:   55 year old hispanic spanish speaking male presenting with acute coronary syndrome with typical chest pain and rise in troponin.  He has risk factors for ACS including hyperlipidemia, hypertension, and age.  No significant family history of premature CAD but on review LDL has been consistently >416.  Currently chest pain free without heart failure syndrome or other high risk features.  Problem list Chest pain Elevated troponin Hypertension Hyperlipidemia ?Claudication  Plan Chest pain Elevated troponin - S/p ASA 325 mg daily start ASA 81 mg indefinitely - Heparin ggt ACS nomogram - Increase to atorvastatin 80 mg daily - TTE - NPO for LHC tomorrow. - Risk stratification labs  Hypertension - start metoprolol tartrate 12.5 mg BID - transition to lisinopril from HCTZ; maximize ACEi  Hyperlipidemia - Atorvastatin 80 mg (intensify, well above goal on atorvastatin 40 mg daily - Start ezetimibe.  Suspect he'll need PCSK9 for complete control  ?Claudication - ABIs as outpatient.   Risk Assessment/Risk Scores:    TIMI Risk Score for Unstable Angina or Non-ST Elevation MI:   The patient's TIMI risk score is 3, which indicates a 13% risk of all cause mortality, new or recurrent myocardial infarction or need for urgent revascularization in the next 14 days.{  Severity of Illness: The appropriate patient status for this patient is INPATIENT. Inpatient status is judged to be reasonable and necessary in order to provide the required intensity of service to ensure the patient's safety. The patient's presenting symptoms, physical exam findings, and initial radiographic and laboratory data in the context of their chronic comorbidities is felt to place them at high risk for further clinical deterioration. Furthermore, it is  not anticipated that the patient will be medically stable for discharge from the hospital within 2 midnights of admission. The following factors support the patient status of inpatient.   " The patient's presenting symptoms include Chest pain " The worrisome physical exam findings include none " The initial radiographic and laboratory data are worrisome because of acute myocardial infarction. " The chronic co-morbidities include hypertension, hyperlipidemia.   * I certify that at the point of admission it is my clinical  judgment that the patient will require inpatient hospital care spanning beyond 2 midnights from the point of admission due to high intensity of service, high risk for further deterioration and high frequency of surveillance required.*   For questions or updates, please contact [facility] Please consult [email or web address] for contact info under     Signed, Regino Schultze, MD  07/14/2020 4:05 AM

## 2020-07-14 NOTE — Progress Notes (Addendum)
 Progress Note  Patient Name: Ethan Silva Date of Encounter: 07/14/2020  [facility] Cardiologist: None   Subjective   Spanish interpreter #[id number] used for interview  No chest pain this morning.   Inpatient Medications    Scheduled Meds:  aspirin EC  81 mg Oral Daily   atorvastatin  40 mg Oral Daily   hydrochlorothiazide  12.5 mg Oral Daily   lisinopril  20 mg Oral Daily   metoprolol tartrate  12.5 mg Oral BID   pantoprazole  40 mg Oral Daily   Continuous Infusions:  heparin 900 Units/hr (07/14/20 0302)   PRN Meds: acetaminophen, nitroGLYCERIN, ondansetron (ZOFRAN) IV   Vital Signs    Vitals:   07/14/20 0230 07/14/20 0300 07/14/20 0315 07/14/20 0353  BP: (!) 145/88 (!) 156/95 (!) 129/93 (!) 163/98  Pulse: 61 (!) 53 (!) 53 (!) 58  Resp: 17 18 (!) 26 16  Temp:    98 F (36.7 C)  TempSrc:    Oral  SpO2: 95% 96% 93%   Weight:    73 kg  Height:    5' 3" (1.6 m)    Intake/Output Summary (Last 24 hours) at 07/14/2020 0901 Last data filed at 07/14/2020 0302 Gross per 24 hour  Intake 56.61 ml  Output --  Net 56.61 ml   Last 3 Weights 07/14/2020 05/31/2020 11/30/2019  Weight (lbs) 161 lb 165 lb 3.2 oz 161 lb  Weight (kg) 73.029 kg 74.934 kg 73.029 kg      Telemetry     SB - Personally Reviewed  ECG    SB - Personally Reviewed  Physical Exam   GEN: No acute distress.   Neck: No JVD Cardiac: RRR, no murmurs, rubs, or gallops.  Respiratory: Clear to auscultation bilaterally. GI: Soft, nontender, non-distended  MS: No edema; No deformity. Neuro:  Nonfocal  Psych: Normal affect   Labs    High Sensitivity Troponin:   Recent Labs  Lab 07/13/20 2017 07/13/20 2239 07/14/20 0306 07/14/20 0459  TROPONINIHS 75* 526* 563* 516*      Chemistry Recent Labs  Lab 07/13/20 2017 07/14/20 0306  NA 140 137  K 3.9 3.6  CL 106 104  CO2 25 25  GLUCOSE 96 94  BUN 21* 19  CREATININE 0.88 0.88  CALCIUM 9.3 8.5*  GFRNONAA >60 >60   ANIONGAP 9 8     Hematology Recent Labs  Lab 07/13/20 2017 07/14/20 0306  WBC 7.3 7.3  RBC 5.68 5.57  HGB 17.3* 16.6  HCT 51.0 51.2  MCV 89.8 91.9  MCH 30.5 29.8  MCHC 33.9 32.4  RDW 13.3 13.4  PLT 214 186    BNPNo results for input(s): BNP, PROBNP in the last 168 hours.   DDimer No results for input(s): DDIMER in the last 168 hours.   Radiology    DG Chest 2 View  Result Date: 07/13/2020 CLINICAL DATA:  55-year-old male with chest pain. EXAM: CHEST - 2 VIEW COMPARISON:  Chest radiograph dated 08/27/2019. FINDINGS: No focal consolidation, pleural effusion, or pneumothorax. The cardiac silhouette is within limits. No acute osseous pathology. IMPRESSION: No active cardiopulmonary disease. Electronically Signed   By: Arash  Radparvar M.D.   On: 07/13/2020 20:34   CT Head Wo Contrast  Result Date: 07/13/2020 CLINICAL DATA:  55-year-old male with chronic headache. EXAM: CT HEAD WITHOUT CONTRAST TECHNIQUE: Contiguous axial images were obtained from the base of the skull through the vertex without intravenous contrast. COMPARISON:  None. FINDINGS: Brain: The ventricles and sulci   are appropriate size for patient's age. The gray-white matter discrimination is preserved. There is no acute intracranial hemorrhage. No mass effect or midline shift. No extra-axial fluid collection. Several scattered small calcific foci, likely sequela of prior insult. A 6 mm left occipital dural based calcific focus may represent a small calcified meningioma. Vascular: No hyperdense vessel or unexpected calcification. Skull: Normal. Negative for fracture or focal lesion. Sinuses/Orbits: There is partial opacification of the right maxillary sinus. No air-fluid level. The mastoid air cells are clear. Partially visualized tooth in the left maxilla. Other: None IMPRESSION: No acute intracranial pathology. Electronically Signed   By: Elgie Collard M.D.   On: 07/13/2020 21:10    Cardiac Studies   Echo:  pending  Patient Profile     55 y.o. male  with hypertension and hyperlipidemia who was being seen 07/14/2020 for the evaluation of chest pain.  Assessment & Plan    NSTEMI: High-sensitivity troponin peaked at 563.  Remains on IV heparin.  Plans for cardiac cath today.  No recurrent chest pain overnight. -- The patient understands that risks included but are not limited to stroke (1 in 1000), death (1 in 1000), kidney failure [usually temporary] (1 in 500), bleeding (1 in 200), allergic reaction [possibly serious] (1 in 200).   -- Continue IV heparin, aspirin, statin, lisinopril.  Initially ordered for beta-blocker on admission but has been bradycardic.  Held for now. --Echo -> bedside review - relatively normal   HTN: Blood pressures have been elevated since admission.  PTA meds include lisinopril hydrochlorothiazide combo. -- We will hold on further titration of ACE/HCTZ combo with need for cardiac cath and contrast use today. --Further adjustments post cath  HLD: LDL 115 --Atorvastatin 40 mg increased to 80 mg on admission --Need LFTs/FLP in 8 weeks  For questions or updates, please contact [facility] Please consult [email or web address] for contact info under        Signed, Laverda Page, NP  07/14/2020, 9:01 AM     ATTENDING ATTESTATION  I have seen, examined and evaluated the patient this AM along with Laverda Page, NP-C.  After reviewing all the available data and chart, we discussed the patients laboratory, study & physical findings as well as symptoms in detail. I agree with her findings, examination as well as impression recommendations as per our discussion.    Attending adjustments noted in italics.   No further angina.  Echo reviewed at bedside looks pretty good.  Troponin elevation was modest to 500 consistent with non-STEMI.  Plan Today.  Not yet on beta-blocker because of bradycardia.  Holding ACE inhibitor until post-cath.  Increase statin to high-dose high  intensity statin.  Further plans based on cath.     Bryan Lemma, M.D., M.S. Interventional Cardiologist   Pager # [phone] Phone # [phone] [address] [place], [place] [address]

## 2020-07-14 NOTE — ED Provider Notes (Signed)
[facility] EMERGENCY DEPARTMENT Provider Note   CSN: [id number] Arrival date & time: 07/13/20  1844     History Chief Complaint  Patient presents with   Chest Pain    Ethan Silva is a 55 y.o. male.  The history is provided by the patient and medical records. The history is limited by a language barrier. A language interpreter was used.  Chest Pain  55 year old Hispanic male significant history of hypertension, hyperlipidemia, who presents for evaluation of chest pain.  History obtained through language interpreter.  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  Described as a pressure sensation to his left chest radiates towards his jaw with associated nausea.  He then checked his blood pressure and states that it was elevated.  He waited for period of time and rechecked it again and blood pressure continues to rise which concerns him.  He did take his blood pressure medication without any relief thus prompting this ER visit.  He report chest pain lasting for about 3 hours and since dissipated.  He is currently pain-free.  He did not endorse any significant shortness of breath or diaphoresis.  He reported having 1 similar episode of chest pain several years ago.  He denies any prior history of MI.  Denies tobacco or alcohol abuse or drug abuse.  Did not endorse any exertional chest pain today.  He has been vaccinated for COVID but without booster.  Past Medical History:  Diagnosis Date   Bile duct leak    Chronic kidney disease 2017   left kidney stones   Headache(784.0)    Hx: of when BP is elevated   Hyperlipidemia    Hypertension    Numbness and tingling in hands    Hx: of   Prediabetes     Patient Active Problem List   Diagnosis Date Noted   BMI 30.0-30.9,adult 08/30/2015   Dyslipidemia 08/30/2015   Essential hypertension 10/11/2012    Past Surgical History:   Procedure Laterality Date   CHOLECYSTECTOMY N/A 07/11/2012   Procedure: LAPAROSCOPIC CHOLECYSTECTOMY WITH INTRAOPERATIVE CHOLANGIOGRAM;  Surgeon: Axel Filler, MD;  Location: [facility] OR;  Service: General;  Laterality: N/A;   COLONOSCOPY     ERCP  07/16/2012   ERCP N/A 07/16/2012   Procedure: ENDOSCOPIC RETROGRADE CHOLANGIOPANCREATOGRAPHY (ERCP);  Surgeon: Louis Meckel, MD;  Location: [facility] OR;  Service: Gastroenterology;  Laterality: N/A;   ERCP N/A 10/06/2012   Procedure: ENDOSCOPIC RETROGRADE CHOLANGIOPANCREATOGRAPHY (ERCP);  Surgeon: Louis Meckel, MD;  Location: [facility]COPY;  Service: Endoscopy;  Laterality: N/A;       Family History  Problem Relation Age of Onset   Gallstones Mother    Diabetes Paternal Uncle    Gallstones Brother     Social History   Tobacco Use   Smoking status: Never   Smokeless tobacco: Never  Substance Use Topics   Alcohol use: No   Drug use: No    Home Medications Prior to Admission medications   Medication Sig Start Date End Date Taking? Authorizing Provider  atorvastatin (LIPITOR) 40 MG tablet Take 1 tablet (40 mg total) by mouth daily. 05/31/20   Georgina Quint, MD  cyclobenzaprine (FLEXERIL) 10 MG tablet Take 1 tablet (10 mg total) by mouth at bedtime as needed for muscle spasms. Patient not taking: Reported on 05/31/2020 09/17/19   Lezlie Lye, Meda Coffee, MD  lisinopril-hydrochlorothiazide (ZESTORETIC) 20-12.5 MG  tablet Take 1 tablet by mouth daily. 05/31/20   Georgina Quint, MD  omeprazole (PRILOSEC) 20 MG capsule Take 1 capsule (20 mg total) by mouth 2 (two) times daily before a meal. 09/17/19   Lezlie Lye, Meda Coffee, MD    Allergies    Patient has no known allergies.  Review of Systems   Review of Systems  Cardiovascular:  Positive for chest pain.  All other systems reviewed and are negative.  Physical Exam Updated Vital Signs BP (!) 140/98 (BP Location: Right Arm)   Pulse (!) 54   Temp 98.3 F (36.8 C) (Oral)   Resp 18    SpO2 97%   Physical Exam Vitals and nursing note reviewed.  Constitutional:      General: He is not in acute distress.    Appearance: He is well-developed.  HENT:     Head: Atraumatic.  Eyes:     Conjunctiva/sclera: Conjunctivae normal.  Cardiovascular:     Rate and Rhythm: Normal rate and regular rhythm.     Pulses: Normal pulses.     Heart sounds: Normal heart sounds.  Pulmonary:     Effort: Pulmonary effort is normal.     Breath sounds: Normal breath sounds.  Chest:     Chest wall: No tenderness.  Abdominal:     Palpations: Abdomen is soft.  Musculoskeletal:     Cervical back: Neck supple.     Right lower leg: No edema.     Left lower leg: No edema.  Skin:    Findings: No rash.  Neurological:     Mental Status: He is alert. Mental status is at baseline.  Psychiatric:        Mood and Affect: Mood normal.    ED Results / Procedures / Treatments   Labs (all labs ordered are listed, but only abnormal results are displayed) Labs Reviewed  BASIC METABOLIC PANEL - Abnormal; Notable for the following components:      Result Value   BUN 21 (*)    All other components within normal limits  CBC - Abnormal; Notable for the following components:   Hemoglobin 17.3 (*)    All other components within normal limits  TROPONIN I (HIGH SENSITIVITY) - Abnormal; Notable for the following components:   Troponin I (High Sensitivity) 75 (*)    All other components within normal limits  TROPONIN I (HIGH SENSITIVITY) - Abnormal; Notable for the following components:   Troponin I (High Sensitivity) 526 (*)    All other components within normal limits  RESP PANEL BY RT-PCR (FLU A&B, COVID) [id number]    EKG EKG Interpretation  Date/Time:  Thursday July 14 2020 00:10:42 EDT Ventricular Rate:  59 PR Interval:  159 QRS Duration: 94 QT Interval:  433 QTC Calculation: 429 R Axis:   18 Text Interpretation: Sinus rhythm Probable left atrial enlargement No significant change since last  tracing Confirmed by Zadie Rhine ([id number]) on 07/14/2020 12:25:33 AM  Radiology DG Chest 2 View  Result Date: 07/13/2020 CLINICAL DATA:  54 year old male with chest pain. EXAM: CHEST - 2 VIEW COMPARISON:  Chest radiograph dated 08/27/2019. FINDINGS: No focal consolidation, pleural effusion, or pneumothorax. The cardiac silhouette is within limits. No acute osseous pathology. IMPRESSION: No active cardiopulmonary disease. Electronically Signed   By: Elgie Collard M.D.   On: 07/13/2020 20:34   CT Head Wo Contrast  Result Date: 07/13/2020 CLINICAL DATA:  56 year old male with chronic headache. EXAM: CT HEAD WITHOUT CONTRAST TECHNIQUE: Contiguous axial images were  obtained from the base of the skull through the vertex without intravenous contrast. COMPARISON:  None. FINDINGS: Brain: The ventricles and sulci are appropriate size for patient's age. The gray-white matter discrimination is preserved. There is no acute intracranial hemorrhage. No mass effect or midline shift. No extra-axial fluid collection. Several scattered small calcific foci, likely sequela of prior insult. A 6 mm left occipital dural based calcific focus may represent a small calcified meningioma. Vascular: No hyperdense vessel or unexpected calcification. Skull: Normal. Negative for fracture or focal lesion. Sinuses/Orbits: There is partial opacification of the right maxillary sinus. No air-fluid level. The mastoid air cells are clear. Partially visualized tooth in the left maxilla. Other: None IMPRESSION: No acute intracranial pathology. Electronically Signed   By: Elgie Collard M.D.   On: 07/13/2020 21:10    Procedures .Critical Care  Date/Time: 07/14/2020 12:40 AM Performed by: Fayrene Helper, PA-C Authorized by: Fayrene Helper, PA-C   Critical care provider statement:    Critical care time (minutes):  40   Critical care was time spent personally by me on the following activities:  Discussions with consultants, evaluation of  patient's response to treatment, examination of patient, ordering and performing treatments and interventions, ordering and review of laboratory studies, ordering and review of radiographic studies, pulse oximetry, re-evaluation of patient's condition, obtaining history from patient or surrogate and review of old charts   Medications Ordered in ED Medications - No data to display  ED Course  I have reviewed the triage vital signs and the nursing notes.  Pertinent labs & imaging results that were available during my care of the patient were reviewed by me and considered in my medical decision making (see chart for details).    MDM Rules/Calculators/A&P                          BP (!) 175/106   Pulse (!) 59   Temp 98.3 F (36.8 C) (Oral)   Resp 20   SpO2 97%   Final Clinical Impression(s) / ED Diagnoses Final diagnoses:  NSTEMI (non-ST elevated myocardial infarction) (HCC)    Rx / DC Orders ED Discharge Orders     None      12:10 AM Patient with several cardiac risk factors who here with concerning chest pain several hours ago that has since resolved.  Unfortunately due to a long wait patient was finally being seen by me after he has been waiting for approximately 5 hours.  Labs are essentially obtained in triage and his initial troponin was 75, delta trop elevated to 526.  EKG without concerning changes, he is currently chest pain-free.  Will initiate heparin as treatment for NSTEMI and will consult medicine for admission.  Care discussed with Dr. Bebe Shaggy.   12:38 AM Appreciate consultation from Cardiologist Dr. Flora Lipps who agrees to admit pt under his care. Pt resting comfortably at this time.  He's amendable to admission.    Fayrene Helper, PA-C 07/14/20 0040    Zadie Rhine, MD 07/14/20 [phone]

## 2020-07-14 NOTE — CV Procedure (Signed)
Left main bend/angulation without significant obstruction or catheter damping with engagement. Luminal irregularities mid LAD Minimal luminal irregularity in the circumflex.  The first obtuse marginal contains proximal to mid segmental 30 to 40% narrowing.  Second marginal is normal, the third marginal is the smallest of the 4 with ostial 90% stenosis (too small to intervene upon).  Third marginal is free of any significant obstruction. The RCA contains proximal luminal irregularity up to 30%.  The mid to distal PDA contains segmental 30 to 40% narrowing.  Continuation of RCA contains 20 to 30% tandem areas of narrowing. Normal LV function.  Normal LVEDP.

## 2020-07-14 NOTE — Progress Notes (Signed)
Pt son states the wife, Isidor Holts will need a note for work stating that the pt was in the hospital.   Maralyn Sago Caelen Higinbotham  07.14.22 2100

## 2020-07-14 NOTE — H&P (View-Only) (Signed)
Progress Note  Patient Name: Jojo Pehl Date of Encounter: 07/14/2020  [facility] Cardiologist: None   Subjective   Spanish interpreter [phone] used for interview  No chest pain this morning.   Inpatient Medications    Scheduled Meds:  aspirin EC  81 mg Oral Daily   atorvastatin  40 mg Oral Daily   hydrochlorothiazide  12.5 mg Oral Daily   lisinopril  20 mg Oral Daily   metoprolol tartrate  12.5 mg Oral BID   pantoprazole  40 mg Oral Daily   Continuous Infusions:  heparin 900 Units/hr (07/14/20 0302)   PRN Meds: acetaminophen, nitroGLYCERIN, ondansetron (ZOFRAN) IV   Vital Signs    Vitals:   07/14/20 0230 07/14/20 0300 07/14/20 0315 07/14/20 0353  BP: (!) 145/88 (!) 156/95 (!) 129/93 (!) 163/98  Pulse: 61 (!) 53 (!) 53 (!) 58  Resp: 17 18 (!) 26 16  Temp:    98 F (36.7 C)  TempSrc:    Oral  SpO2: 95% 96% 93%   Weight:    73 kg  Height:    5\' 3"  (1.6 m)    Intake/Output Summary (Last 24 hours) at 07/14/2020 0901 Last data filed at 07/14/2020 0302 Gross per 24 hour  Intake 56.61 ml  Output --  Net 56.61 ml   Last 3 Weights 07/14/2020 05/31/2020 11/30/2019  Weight (lbs) 161 lb 165 lb 3.2 oz 161 lb  Weight (kg) 73.029 kg 74.934 kg 73.029 kg      Telemetry     SB - Personally Reviewed  ECG    SB - Personally Reviewed  Physical Exam   GEN: No acute distress.   Neck: No JVD Cardiac: RRR, no murmurs, rubs, or gallops.  Respiratory: Clear to auscultation bilaterally. GI: Soft, nontender, non-distended  MS: No edema; No deformity. Neuro:  Nonfocal  Psych: Normal affect   Labs    High Sensitivity Troponin:   Recent Labs  Lab 07/13/20 2017 07/13/20 2239 07/14/20 0306 07/14/20 0459  TROPONINIHS 75* 526* 563* 516*      Chemistry Recent Labs  Lab 07/13/20 2017 07/14/20 0306  NA 140 137  K 3.9 3.6  CL 106 104  CO2 25 25  GLUCOSE 96 94  BUN 21* 19  CREATININE 0.88 0.88  CALCIUM 9.3 8.5*  GFRNONAA >60 >60   ANIONGAP 9 8     Hematology Recent Labs  Lab 07/13/20 2017 07/14/20 0306  WBC 7.3 7.3  RBC 5.68 5.57  HGB 17.3* 16.6  HCT 51.0 51.2  MCV 89.8 91.9  MCH 30.5 29.8  MCHC 33.9 32.4  RDW 13.3 13.4  PLT 214 186    BNPNo results for input(s): BNP, PROBNP in the last 168 hours.   DDimer No results for input(s): DDIMER in the last 168 hours.   Radiology    DG Chest 2 View  Result Date: 07/13/2020 CLINICAL DATA:  55 year old male with chest pain. EXAM: CHEST - 2 VIEW COMPARISON:  Chest radiograph dated 08/27/2019. FINDINGS: No focal consolidation, pleural effusion, or pneumothorax. The cardiac silhouette is within limits. No acute osseous pathology. IMPRESSION: No active cardiopulmonary disease. Electronically Signed   By: 08/29/2019 M.D.   On: 07/13/2020 20:34   CT Head Wo Contrast  Result Date: 07/13/2020 CLINICAL DATA:  54 year old male with chronic headache. EXAM: CT HEAD WITHOUT CONTRAST TECHNIQUE: Contiguous axial images were obtained from the base of the skull through the vertex without intravenous contrast. COMPARISON:  None. FINDINGS: Brain: The ventricles and sulci  are appropriate size for patient's age. The gray-white matter discrimination is preserved. There is no acute intracranial hemorrhage. No mass effect or midline shift. No extra-axial fluid collection. Several scattered small calcific foci, likely sequela of prior insult. A 6 mm left occipital dural based calcific focus may represent a small calcified meningioma. Vascular: No hyperdense vessel or unexpected calcification. Skull: Normal. Negative for fracture or focal lesion. Sinuses/Orbits: There is partial opacification of the right maxillary sinus. No air-fluid level. The mastoid air cells are clear. Partially visualized tooth in the left maxilla. Other: None IMPRESSION: No acute intracranial pathology. Electronically Signed   By: Elgie Collard M.D.   On: 07/13/2020 21:10    Cardiac Studies   Echo:  pending  Patient Profile     55 y.o. male  with hypertension and hyperlipidemia who was being seen 07/14/2020 for the evaluation of chest pain.  Assessment & Plan    NSTEMI: High-sensitivity troponin peaked at 563.  Remains on IV heparin.  Plans for cardiac cath today.  No recurrent chest pain overnight. -- The patient understands that risks included but are not limited to stroke (1 in 1000), death (1 in 1000), kidney failure [usually temporary] (1 in 500), bleeding (1 in 200), allergic reaction [possibly serious] (1 in 200).   -- Continue IV heparin, aspirin, statin, lisinopril.  Initially ordered for beta-blocker on admission but has been bradycardic.  Held for now. --Echo -> bedside review - relatively normal   HTN: Blood pressures have been elevated since admission.  PTA meds include lisinopril hydrochlorothiazide combo. -- We will hold on further titration of ACE/HCTZ combo with need for cardiac cath and contrast use today. --Further adjustments post cath  HLD: LDL 115 --Atorvastatin 40 mg increased to 80 mg on admission --Need LFTs/FLP in 8 weeks  For questions or updates, please contact [facility] Please consult [email or web address] for contact info under        Signed, Laverda Page, NP  07/14/2020, 9:01 AM     ATTENDING ATTESTATION  I have seen, examined and evaluated the patient this AM along with Laverda Page, NP-C.  After reviewing all the available data and chart, we discussed the patients laboratory, study & physical findings as well as symptoms in detail. I agree with her findings, examination as well as impression recommendations as per our discussion.    Attending adjustments noted in italics.   No further angina.  Echo reviewed at bedside looks pretty good.  Troponin elevation was modest to 500 consistent with non-STEMI.  Plan Today.  Not yet on beta-blocker because of bradycardia.  Holding ACE inhibitor until post-cath.  Increase statin to high-dose high  intensity statin.  Further plans based on cath.     Bryan Lemma, M.D., M.S. Interventional Cardiologist   Pager # [phone] Phone # [phone] [address] [place], [place] [address]

## 2020-07-14 NOTE — Plan of Care (Signed)
  Problem: Education: Goal: Knowledge of General Education information will improve Description Including pain rating scale, medication(s)/side effects and non-pharmacologic comfort measures Outcome: Progressing   

## 2020-07-14 NOTE — Progress Notes (Signed)
ANTICOAGULATION CONSULT NOTE - Initial Consult  Pharmacy Consult for Heparin  Indication: chest pain/ACS  No Known Allergies  Vital Signs: Temp: 98.3 F (36.8 C) (07/13 1847) Temp Source: Oral (07/13 1847) BP: 140/94 (07/14 0115) Pulse Rate: 62 (07/14 0115)  Labs: Recent Labs    07/13/20 2017 07/13/20 2239  HGB 17.3*  --   HCT 51.0  --   PLT 214  --   CREATININE 0.88  --   TROPONINIHS 75* 526*    CrCl cannot be calculated (Unknown ideal weight.).   Medical History: Past Medical History:  Diagnosis Date   Bile duct leak    Chronic kidney disease 2017   left kidney stones   Headache(784.0)    Hx: of when BP is elevated   Hyperlipidemia    Hypertension    Numbness and tingling in hands    Hx: of   Prediabetes     Assessment: 55 y/o M with chest pain and elevated troponin to start heparin. PTA meds reviewed. CBC/renal function good.   Goal of Therapy:  Heparin level 0.3-0.7 units/ml Monitor platelets by anticoagulation protocol: Yes   Plan:  Heparin 4000 units BOLUS Start heparin drip at 900 units/hr 0900 Heparin level Daily CBC/Heparin level Monitor for bleeding  Abran Duke, PharmD, BCPS Clinical Pharmacist Phone: [phone]

## 2020-07-14 NOTE — Interval H&P Note (Signed)
Cath Lab Visit (complete for each Cath Lab visit)  Clinical Evaluation Leading to the Procedure:   ACS: Yes.    Non-ACS:    Anginal Classification: CCS III  Anti-ischemic medical therapy: Minimal Therapy (1 class of medications)  Non-Invasive Test Results: No non-invasive testing performed  Prior CABG: No previous CABG      History and Physical Interval Note:  07/14/2020 12:25 PM  Ethan Silva  has presented today for surgery, with the diagnosis of nstemi.  The various methods of treatment have been discussed with the patient and family. After consideration of risks, benefits and other options for treatment, the patient has consented to  Procedure(s): LEFT HEART CATH AND CORONARY ANGIOGRAPHY (N/A) as a surgical intervention.  The patient's history has been reviewed, patient examined, no change in status, stable for surgery.  I have reviewed the patient's chart and labs.  Questions were answered to the patient's satisfaction.     Lyn Records III

## 2020-07-15 ENCOUNTER — Other Ambulatory Visit (HOSPITAL_COMMUNITY): Payer: Self-pay

## 2020-07-15 DIAGNOSIS — I25119 Atherosclerotic heart disease of native coronary artery with unspecified angina pectoris: Secondary | ICD-10-CM

## 2020-07-15 DIAGNOSIS — R7303 Prediabetes: Secondary | ICD-10-CM

## 2020-07-15 DIAGNOSIS — I2510 Atherosclerotic heart disease of native coronary artery without angina pectoris: Secondary | ICD-10-CM

## 2020-07-15 DIAGNOSIS — I25118 Atherosclerotic heart disease of native coronary artery with other forms of angina pectoris: Secondary | ICD-10-CM

## 2020-07-15 LAB — BASIC METABOLIC PANEL
Anion gap: 6 (ref 5–15)
BUN: 18 mg/dL (ref 6–20)
CO2: 25 mmol/L (ref 22–32)
Calcium: 8.7 mg/dL — ABNORMAL LOW (ref 8.9–10.3)
Chloride: 105 mmol/L (ref 98–111)
Creatinine, Ser: 0.98 mg/dL (ref 0.61–1.24)
GFR, Estimated: >60 mL/min (ref >60)
Glucose, Bld: 90 mg/dL (ref 70–99)
Potassium: 3.5 mmol/L (ref 3.5–5.1)
Sodium: 136 mmol/L (ref 135–145)

## 2020-07-15 LAB — CBC
HCT: 49.2 % (ref 39.0–52.0)
Hemoglobin: 16.8 g/dL (ref 13.0–17.0)
MCH: 30.1 pg (ref 26.0–34.0)
MCHC: 34.1 g/dL (ref 30.0–36.0)
MCV: 88.2 fL (ref 80.0–100.0)
Platelets: 186 10*3/uL (ref 150–400)
RBC: 5.58 MIL/uL (ref 4.22–5.81)
RDW: 13.2 % (ref 11.5–15.5)
WBC: 8.2 10*3/uL (ref 4.0–10.5)
nRBC: 0 % (ref 0.0–0.2)

## 2020-07-15 MED ORDER — ATORVASTATIN CALCIUM 80 MG PO TABS
80.0000 mg | ORAL_TABLET | Freq: Every day | ORAL | 3 refills | Status: DC
  Filled 2020-07-15: qty 90, 90d supply, fill #0

## 2020-07-15 MED ORDER — CLOPIDOGREL BISULFATE 75 MG PO TABS
75.0000 mg | ORAL_TABLET | Freq: Every day | ORAL | 3 refills | Status: DC
  Filled 2020-07-15: qty 90, 90d supply, fill #0

## 2020-07-15 MED ORDER — AMLODIPINE BESYLATE 5 MG PO TABS
5.0000 mg | ORAL_TABLET | Freq: Every day | ORAL | 11 refills | Status: DC
  Filled 2020-07-15: qty 30, 30d supply, fill #0

## 2020-07-15 MED ORDER — AMLODIPINE BESYLATE 5 MG PO TABS
5.0000 mg | ORAL_TABLET | Freq: Every day | ORAL | Status: DC
  Administered 2020-07-15: 5 mg via ORAL
  Filled 2020-07-15: qty 1

## 2020-07-15 MED ORDER — PANTOPRAZOLE SODIUM 40 MG PO TBEC
40.0000 mg | DELAYED_RELEASE_TABLET | Freq: Every day | ORAL | 11 refills | Status: DC
  Filled 2020-07-15: qty 30, 30d supply, fill #0

## 2020-07-15 MED ORDER — ASPIRIN 81 MG PO TBEC
81.0000 mg | DELAYED_RELEASE_TABLET | Freq: Every day | ORAL | 3 refills | Status: AC
  Filled 2020-07-15: qty 90, 90d supply, fill #0

## 2020-07-15 MED ORDER — NITROGLYCERIN 0.4 MG SL SUBL
0.4000 mg | SUBLINGUAL_TABLET | SUBLINGUAL | 1 refills | Status: DC | PRN
Start: 2020-07-15 — End: 2021-11-13
  Filled 2020-07-15: qty 25, 8d supply, fill #0

## 2020-07-15 NOTE — Discharge Summary (Addendum)
Discharge Summary    Patient ID: Ethan Silva MRN: [id number]; DOB: 07/02/1965  Admit date: 07/13/2020 Discharge date: 07/15/2020  PCP:  Georgina Quint, MD   [facility] Providers Cardiologist:  Bryan Lemma, MD   {  Discharge Diagnoses    Principal Problem:   NSTEMI (non-ST elevated myocardial infarction) [facility]) Active Problems:   Essential hypertension   BMI 30.0-30.9,adult   Dyslipidemia   CAD (coronary artery disease)   Prediabetes   Diagnostic Studies/Procedures    Left heart cath 07/14/20: Small third obtuse marginal with 80% ostial narrowing.  Too small to intervene upon.  First obtuse marginal proximal to mid 40% narrowing. Left main kink/angulation with < 30% narrowing.  No high-grade left main disease is noted.  No catheter damping with deep engagement. LAD is large and gives origin to 2 diagonal branches and is free of obstructive disease.  The mid vessel contains 30 to 40% narrowing. Right coronary with luminal irregularities proximal with up to 30% narrowing.  PDA mid segment contains 30% narrowing.  RCA continuation contains less than 25% tandem stenoses. Normal LVEDP.  LVEF 55%.   RECOMMENDATIONS:   Risk factor modification Tight blood pressure control  _____________   Echo 07/14/20:  1. Left ventricular ejection fraction, by estimation, is 55 to 60%. The  left ventricle has normal function. The left ventricle has no regional  wall motion abnormalities. Left ventricular diastolic parameters were  normal.   2. Right ventricular systolic function is normal. The right ventricular  size is normal. Tricuspid regurgitation signal is inadequate for assessing  PA pressure.   3. The mitral valve is normal in structure. Trivial mitral valve  regurgitation. No evidence of mitral stenosis.   4. The aortic valve is tricuspid. Aortic valve regurgitation is not  visualized. Mild aortic valve stenosis. Aortic valve area, by VTI measures  1.73  cm. Aortic valve mean gradient measures 10.0 mmHg.   5. The inferior vena cava is normal in size with greater than 50%  respiratory variability, suggesting right atrial pressure of 3 mmHg.    History of Present Illness     Ethan Silva is a 55 y.o. male with HTN and HLD who presented to [facility] on 07/14/20 with chest pain.   Preferred language: spanish.  Ethan. Silva is a 55 year old male with history of hypertension, hyperlipidemia, prior bile duct leak following cholecystectomy who presented for evaluation of chest pain.  He was in his usual state of health until this evening around 6 PM when he developed substernal chest pain and pressure that lasted about an hour and resolved uneventfully.  He noted that his blood pressure was elevated to 190 systolic in the context of this.  He did not require nitroglycerin.  He is chest pain free at this time.  He denies shortness of breath, orthopnea, PND, palpitations, cough, fever, chills.  He has no prior cardiovascular history though he does endorse unclear leg weakness with prolonged ambulation that improves with rest in his lower extremities, worse on the right.   ECG without acute ischemia.  Troponin trended 75 -> 526.  Pt was admitted to cardiology for further workup.  Hospital Course     Consultants: none  NSTEMI Chest pain HS troponin: 75 --> 526 --> 563 --> 516 EKG did not appear ischemic.  Risk factors for ACS include HLD and HTN.  Pt underwent difinitive angiography which revealed 80% OM3 too small for intervention. RCA, PDA, OM1, and mid LAD with mild nonobstructive  disease to be treated medically. Continue ASA. No BB given bradycardia.   Echo showed a LVEF of 55-60%, mild AS, and estimated normal right atrial pressure.  He presented with hypertensive urgency and BP  199/110 which could have contributed to demand ischemia and troponin rise.  However, question plaque rupture not seen during angiography in addition to  distal diease. Will keep on ASA and plavix x 6 months, then ASA monotherapy.   Pt denies further chest pain.  He has ambulated with cardiac rehab.   Hyperlipidemia with LDL goal < 70 LDL has fluctuated, but has been as high as 203 (2019) and generally above 180. 07/14/2020: Cholesterol 162; HDL 40; LDL Cholesterol 115; Triglycerides 33; VLDL 7 Home 40 mg lipitor changed to 80 mg lipitor.  Repeat lipids in 6-8 weeks.   Hypertension Recommend tight control. Resume home lisinopril-HCTZ and add 5 mg amlodipine.    Prediabetes A1c 5.9% Work on lifestyle changes.   Pt seen and examined with Dr. Herbie Baltimore and deemed stable for discharge.   Did the patient have an acute coronary syndrome (MI, NSTEMI, STEMI, etc) this admission?:  No.   The elevated Troponin was due to the acute medical illness (demand ischemia).      _____________  Discharge Vitals Blood pressure (!) 143/101, pulse (!) 58, temperature 97.7 F (36.5 C), temperature source Oral, resp. rate 16, height  (1.6 m), weight 72.7 kg, SpO2 97 %.  Filed Weights   07/14/20 0353 07/14/20 1047 07/15/20 0454  Weight: 73 kg 73 kg 72.7 kg   Physical Exam Constitutional:      Appearance: Normal appearance.  Eyes:     Extraocular Movements: Extraocular movements intact.  Neck:     Comments: No JVD Cardiovascular:     Rate and Rhythm: Regular rhythm. Bradycardia present.     Heart sounds: No murmur heard. Pulmonary:     Effort: Pulmonary effort is normal.     Breath sounds: Normal breath sounds. No wheezing.  Abdominal:     General: Bowel sounds are normal.     Palpations: Abdomen is soft.  Neurological:     Mental Status: He is alert and oriented to person, place, and time. Mental status is at baseline.  Psychiatric:        Behavior: Behavior normal.    Right heart cath C/D/I   Labs & Radiologic Studies    CBC Recent Labs    07/14/20 0306 07/15/20 0221  WBC 7.3 8.2  HGB 16.6 16.8  HCT 51.2 49.2  MCV 91.9  88.2  PLT 186 186   Basic Metabolic Panel Recent Labs    29/56/21 0306 07/15/20 0221  NA 137 136  K 3.6 3.5  CL 104 105  CO2 25 25  GLUCOSE 94 90  BUN 19 18  CREATININE 0.88 0.98  CALCIUM 8.5* 8.7*   Liver Function Tests No results for input(s): AST, ALT, ALKPHOS, BILITOT, PROT, ALBUMIN in the last 72 hours. No results for input(s): LIPASE, AMYLASE in the last 72 hours. High Sensitivity Troponin:   Recent Labs  Lab 07/13/20 2017 07/13/20 2239 07/14/20 0306 07/14/20 0459  TROPONINIHS 75* 526* 563* 516*    BNP Invalid input(s): POCBNP D-Dimer No results for input(s): DDIMER in the last 72 hours. Hemoglobin A1C Recent Labs    07/14/20 0306  HGBA1C 5.9*   Fasting Lipid Panel Recent Labs    07/14/20 0402  CHOL 162  HDL 40*  LDLCALC 115*  TRIG 33  CHOLHDL 4.1  Thyroid Function Tests No results for input(s): TSH, T4TOTAL, T3FREE, THYROIDAB in the last 72 hours.  Invalid input(s): FREET3 _____________  DG Chest 2 View  Result Date: 07/13/2020 CLINICAL DATA:  55 year old male with chest pain. EXAM: CHEST - 2 VIEW COMPARISON:  Chest radiograph dated 08/27/2019. FINDINGS: No focal consolidation, pleural effusion, or pneumothorax. The cardiac silhouette is within limits. No acute osseous pathology. IMPRESSION: No active cardiopulmonary disease. Electronically Signed   By: Elgie Collard M.D.   On: 07/13/2020 20:34   CT Head Wo Contrast  Result Date: 07/13/2020 CLINICAL DATA:  55 year old male with chronic headache. EXAM: CT HEAD WITHOUT CONTRAST TECHNIQUE: Contiguous axial images were obtained from the base of the skull through the vertex without intravenous contrast. COMPARISON:  None. FINDINGS: Brain: The ventricles and sulci are appropriate size for patient's age. The gray-white matter discrimination is preserved. There is no acute intracranial hemorrhage. No mass effect or midline shift. No extra-axial fluid collection. Several scattered small calcific foci,  likely sequela of prior insult. A 6 mm left occipital dural based calcific focus may represent a small calcified meningioma. Vascular: No hyperdense vessel or unexpected calcification. Skull: Normal. Negative for fracture or focal lesion. Sinuses/Orbits: There is partial opacification of the right maxillary sinus. No air-fluid level. The mastoid air cells are clear. Partially visualized tooth in the left maxilla. Other: None IMPRESSION: No acute intracranial pathology. Electronically Signed   By: Elgie Collard M.D.   On: 07/13/2020 21:10   CARDIAC CATHETERIZATION  Result Date: 07/14/2020 Small third obtuse marginal with 80% ostial narrowing.  Too small to intervene upon.  First obtuse marginal proximal to mid 40% narrowing. Left main kink/angulation with < 30% narrowing.  No high-grade left main disease is noted.  No catheter damping with deep engagement. LAD is large and gives origin to 2 diagonal branches and is free of obstructive disease.  The mid vessel contains 30 to 40% narrowing. Right coronary with luminal irregularities proximal with up to 30% narrowing.  PDA mid segment contains 30% narrowing.  RCA continuation contains less than 25% tandem stenoses. Normal LVEDP.  LVEF 55%. RECOMMENDATIONS: Risk factor modification Tight blood pressure control  ECHOCARDIOGRAM COMPLETE  Result Date: 07/14/2020    ECHOCARDIOGRAM REPORT   Patient Name:   CEDAR DITULLIO Date of Exam: 07/14/2020 Medical Rec #:  [id number]                   Height:       63.0 in Accession #:    [id number]                  Weight:       161.0 lb Date of Birth:  [birth year]                   BSA:          1.763 m Patient Age:    55 years                    BP:           163/98 mmHg Patient Gender: M                           HR:           58 bpm. Exam Location:  Inpatient Procedure: 2D Echo, Cardiac Doppler and Color Doppler Indications:    Acute myocardial infarction  History:  Patient has no prior history of  Echocardiogram examinations.                 Risk Factors:Hypertension.  Sonographer:    Shirlean Kelly Referring Phys: [id number] CARRIEL T NIPP IMPRESSIONS  1. Left ventricular ejection fraction, by estimation, is 55 to 60%. The left ventricle has normal function. The left ventricle has no regional wall motion abnormalities. Left ventricular diastolic parameters were normal.  2. Right ventricular systolic function is normal. The right ventricular size is normal. Tricuspid regurgitation signal is inadequate for assessing PA pressure.  3. The mitral valve is normal in structure. Trivial mitral valve regurgitation. No evidence of mitral stenosis.  4. The aortic valve is tricuspid. Aortic valve regurgitation is not visualized. Mild aortic valve stenosis. Aortic valve area, by VTI measures 1.73 cm. Aortic valve mean gradient measures 10.0 mmHg.  5. The inferior vena cava is normal in size with greater than 50% respiratory variability, suggesting right atrial pressure of 3 mmHg. FINDINGS  Left Ventricle: Left ventricular ejection fraction, by estimation, is 55 to 60%. The left ventricle has normal function. The left ventricle has no regional wall motion abnormalities. The left ventricular internal cavity size was normal in size. There is  no left ventricular hypertrophy. Left ventricular diastolic parameters were normal. Right Ventricle: The right ventricular size is normal. No increase in right ventricular wall thickness. Right ventricular systolic function is normal. Tricuspid regurgitation signal is inadequate for assessing PA pressure. Left Atrium: Left atrial size was normal in size. Right Atrium: Right atrial size was normal in size. Pericardium: There is no evidence of pericardial effusion. Mitral Valve: The mitral valve is normal in structure. There is mild thickening of the mitral valve leaflet(s). Mild mitral annular calcification. Trivial mitral valve regurgitation. No evidence of mitral valve stenosis.  Tricuspid Valve: The tricuspid valve is normal in structure. Tricuspid valve regurgitation is not demonstrated. Aortic Valve: The aortic valve is tricuspid. Aortic valve regurgitation is not visualized. Mild aortic stenosis is present. Aortic valve mean gradient measures 10.0 mmHg. Aortic valve peak gradient measures 19.0 mmHg. Aortic valve area, by VTI measures 1.73 cm. Pulmonic Valve: The pulmonic valve was normal in structure. Pulmonic valve regurgitation is trivial. Aorta: The aortic root is normal in size and structure. Venous: The inferior vena cava is normal in size with greater than 50% respiratory variability, suggesting right atrial pressure of 3 mmHg. IAS/Shunts: No atrial level shunt detected by color flow Doppler.  LEFT VENTRICLE PLAX 2D LVIDd:         4.90 cm  Diastology LVIDs:         3.10 cm  LV e' medial:    9.03 cm/s LV PW:         1.10 cm  LV E/e' medial:  11.7 LV IVS:        0.90 cm  LV e' lateral:   12.10 cm/s LVOT diam:     2.00 cm  LV E/e' lateral: 8.8 LV SV:         75 LV SV Index:   42 LVOT Area:     3.14 cm  RIGHT VENTRICLE RV Basal diam:  4.10 cm RV S prime:     15.90 cm/s TAPSE (M-mode): 2.9 cm LEFT ATRIUM             Index       RIGHT ATRIUM           Index LA diam:  4.00 cm 2.27 cm/m  RA Area:     14.20 cm LA Vol (A2C):   56.5 ml 32.04 ml/m RA Volume:   34.40 ml  19.51 ml/m LA Vol (A4C):   46.5 ml 26.37 ml/m LA Biplane Vol: 51.2 ml 29.04 ml/m  AORTIC VALVE AV Area (Vmax):    1.64 cm AV Area (Vmean):   1.65 cm AV Area (VTI):     1.73 cm AV Vmax:           218.00 cm/s AV Vmean:          142.000 cm/s AV VTI:            0.432 m AV Peak Grad:      19.0 mmHg AV Mean Grad:      10.0 mmHg LVOT Vmax:         114.00 cm/s LVOT Vmean:        74.600 cm/s LVOT VTI:          0.238 m LVOT/AV VTI ratio: 0.55  AORTA Ao Root diam: 3.00 cm Ao Asc diam:  3.10 cm MITRAL VALVE MV Area (PHT): 3.99 cm     SHUNTS MV Decel Time: 190 msec     Systemic VTI:  0.24 m MV E velocity: 106.00 cm/s   Systemic Diam: 2.00 cm MV A velocity: 103.00 cm/s MV E/A ratio:  1.03 Marca Anconaalton Mclean MD Electronically signed by Marca Anconaalton Mclean MD Signature Date/Time: 07/14/2020/3:26:21 PM    Final    Disposition   Pt is being discharged home today in good condition.  Follow-up Plans & Appointments     Follow-up Information     Marykay LexHarding, David W, MD Follow up on 07/20/2020.   Specialty: Cardiology Why: 9:20 am Contact information: [address] [place] [address] [phone]                Discharge Instructions     Amb Referral to Cardiac Rehabilitation   Complete by: As directed    Diagnosis: NSTEMI   After initial evaluation and assessments completed: Virtual Based Care may be provided alone or in conjunction with Phase 2 Cardiac Rehab based on patient barriers.: Yes   Diet - low sodium heart healthy   Complete by: As directed    Discharge instructions   Complete by: As directed    No driving for 1 week. No lifting over 5 lbs for 1 week. No sexual activity for 1 week. You may return to work after your follow up appt. Keep procedure site clean & dry. If you notice increased pain, swelling, bleeding or pus, call/return!  You may shower, but no soaking baths/hot tubs/pools for 1 week.   No conducir durante 1 semana. No levantar ms de 5 libras durante 1 semana. Sin actividad sexual durante 1 semana. Puede volver a trabajar despus de su cita de seguimiento. Mantenga el lugar del procedimiento limpio y Dealer[employer]. Si nota aumento del dolor, hinchazn, sangrado o pus, llame/devuelva! Puede ducharse, pero no tomar baos profundos/jacuzzis/piscinas durante 1 semana.   Increase activity slowly   Complete by: As directed        Discharge Medications   Allergies as of 07/15/2020   No Known Allergies      Medication List     STOP taking these medications    cyclobenzaprine 10 MG tablet Commonly known as: FLEXERIL   omeprazole 20 MG capsule Commonly known as:  PRILOSEC Replaced by: pantoprazole 40 MG tablet       TAKE  these medications    amLODipine 5 MG tablet Commonly known as: NORVASC Tome 1 tableta (5 mg en total) por va oral diariamente. (Take 1 tablet (5 mg total) by mouth daily.)   aspirin 81 MG EC tablet Take 1 tablet (81 mg total) by mouth daily. Swallow whole.   atorvastatin 80 MG tablet Commonly known as: LIPITOR Tome 1 tableta (80 mg en total) por va oral diariamente. (Take 1 tablet (80 mg total) by mouth daily.) What changed:  medication strength how much to take   clopidogrel 75 MG tablet Commonly known as: PLAVIX Tome 1 tableta (75 mg en total) por va oral diariamente. (Take 1 tablet (75 mg total) by mouth daily.)   lisinopril-hydrochlorothiazide 20-12.5 MG tablet Commonly known as: Zestoretic Take 1 tablet by mouth daily.   nitroGLYCERIN 0.4 MG SL tablet Commonly known as: NITROSTAT Place 1 tablet (0.4 mg total) under the tongue every 5 (five) minutes x 3 doses as needed for chest pain.   pantoprazole 40 MG tablet Commonly known as: PROTONIX Tome 1 tableta (40 mg en total) por va oral diariamente. (Take 1 tablet (40 mg total) by mouth daily.) Start taking on: July 16, 2020 Replaces: omeprazole 20 MG capsule           Outstanding Labs/Studies   Lipids in 6 weeks  Duration of Discharge Encounter   Greater than 30 minutes including physician time.  Signed, Roe Rutherford Tristyn Demarest, PA 07/15/2020, 9:58 AM

## 2020-07-15 NOTE — Progress Notes (Signed)
CARDIAC REHAB PHASE I   PRE:  Rate/Rhythm: 46 SB  BP:  Sitting: 133/100      SaO2: 96 RA  MODE:  Ambulation: 920 ft   POST:  Rate/Rhythm: 68 SR  BP:  Sitting: 155/100      SaO2: 97 RA  Pt tolerated exercise well and amb 920 ft independently. Pt had no rest breaks or C/O C/P, SOB, or dizziness. Pt to bed after walk w/ call bell in reach. Pt speaks Spanish as Insurance underwriter were left with him. Will follow up to education w/ interpreter.  [id number]  Joya San, MS, ACM-CEP 07/15/2020 8:34 AM

## 2020-07-15 NOTE — Progress Notes (Signed)
Discussed with pt through interpreter Graciela MI, restrictions, diet, exercise, NTG, and CRPII. Pt voiced understanding. Will refer to [place] CRPII. Gave materials in [place]. Unfortunately the video system is not working (IT called).  [id number] Ethelda Chick CES, ACSM ,9:44 AM 07/15/2020

## 2020-07-15 NOTE — Plan of Care (Signed)
  Problem: Education: Goal: Knowledge of General Education information will improve Description Including pain rating scale, medication(s)/side effects and non-pharmacologic comfort measures Outcome: Progressing   

## 2020-07-15 NOTE — TOC Progression Note (Signed)
Transition of Care [facility]) - Progression Note    Patient Details  Name: Ethan Silva MRN: [id number] Date of Birth: 12-Nov-1965  [facility]) CM/SW Contact  Leone Haven, RN Phone Number: 07/15/2020, 12:30 PM  Clinical Narrative:    NCM went to see patient, he has been discharged by Staff RN Cherise.  NCM asked her if he needed anything, she states he did not have any TOC needs.        Expected Discharge Plan and Services           Expected Discharge Date: 07/15/20                                     Social Determinants of Health (SDOH) Interventions    Readmission Risk Interventions No flowsheet data found.

## 2020-07-15 NOTE — Plan of Care (Signed)
  Problem: Education: Goal: Knowledge of General Education information will improve Description: Including pain rating scale, medication(s)/side effects and non-pharmacologic comfort measures 07/15/2020 1207 by Roselyn Bering, RN Outcome: Adequate for Discharge 07/15/2020 0755 by Roselyn Bering, RN Outcome: Progressing

## 2020-07-19 NOTE — Progress Notes (Addendum)
Primary Care Provider: Georgina Quint, MD Cardiologist: Bryan Lemma, MD Electrophysiologist: None  Clinic Note: Chief Complaint  Patient presents with   Hospitalization Follow-up    Recent admission for "non-STEMI "with hypertensive emergency     ===================================  ASSESSMENT/PLAN   Problem List Items Addressed This Visit     Coronary artery disease involving native coronary artery of native heart with angina pectoris (HCC) - Primary (Chronic)    No longer having any chest pain.  I suspect that his elevated troponin and angina is related to hypertension in setting of known vessel disease. The only potential culprit lesion was not PCI amenable.  Plan: Continue aggressive management with high-dose statin and DAPT.  We will continue aspirin Plavix and hopefully for up to 1 year.  Okay to hold for procedures or surgeries.  5 to 7 days depending on procedure.   Beta-blocker because of bradycardia.  We used amlodipine for possible spasm and BP control..       Relevant Orders   EKG 12-Lead (Completed)   Lipid panel   Essential hypertension (Chronic)    Uncontrolled at the time of his hospital stay.  Internal.  He remains on Zestoretic 20-12.5 mg daily along with amlodipine 5 midodrine daily.  He is a beta-blocker because of bradycardia.  With potential spasm, amlodipine was added in the hospital.  PRN additional dose of Amlodipine for SBP > 160 mmHg. Along with NTG SL.        Relevant Orders   EKG 12-Lead (Completed)   Hyperlipidemia with target LDL less than 70 (Chronic)    Now increased up to 80 mg atorvastatin having increased from 40 mg.  LDL during his hospital stay was 115.  Recheck in ~3 months.       Relevant Orders   EKG 12-Lead (Completed)   Lipid panel   Prediabetes (Chronic)    A1c 5.9 --> monitor. Discussed healthy diet. No Med Rx for now.        Relevant Orders   EKG 12-Lead (Completed)   NSTEMI (non-ST elevated  myocardial infarction) (HCC) (Chronic)    He did not have an obvious culprit lesion, the only notable stenosis was a small branch of OM3.  Not with PCI.  < 2 cm vessel with ostial lesion --> I suspect the troponin elevation was probably more related to hypotension in the setting of existing disease.  Potentially demand ischemia.  Preserved EF.  No recurrent symptoms.  No heart failure.       Relevant Orders   EKG 12-Lead (Completed)   Lipid panel    ===================================  HPI:    Ethan Silva is an obese 55 y.o. male with a PMH small vessel CAD, HTN, HLD, and prediabetes who presents today for hospital follow-up after recent NSTEMI/Accelerated Hypertension. --He was Accompanied by a [employer] Spanish Language Interpreter during this visit.   Recent Hospitalizations:  7/13-2015/2022: Admitted to [facility] with substernal chest pain/pressure lasting an hour.  Was noted to be hypertensive in the 190s systolic.  Troponin increased to ~560.  Considered non-STEMI-referred for cardiac cath showing potential culprit as a very small caliber branch of an OM.  Plan medical management.  EF is normal.  Reviewed  CV studies:    The following studies were reviewed today: (if available, images/films personally reviewed: From Epic Chart or Care Everywhere) TTE 07/14/2020 (NSTEMI/Accelerated Hypertension): EF 55 to 60%.  No RWM A.  Very mild Aortic Stenosis-mean gradient . Cardiac Cath 07/14/2020: ?  Culprit Lesion ~ 80% ostial small OM3 (too small & ostial for PCI). ~30% mid LM (at a bend).  LAD with D1 & D2 - normal, RCA minimal luminal irregularities ~ 30%. PDA     Interval History:   Ethan Silva returns for his initial post hospital follow-up doing quite well.  No major complaints since his hospitalization.  He said prior to his MI and he came in and blood pressures were up in the 180s and 190s.  He has not had any issues with that since.   He described his anginal equivalent as like a trembling sensation in his chest.  He has not had any more that since.  He seems to be tolerating medications without a major issues.  Is eager to go back to work.  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  CV Review of Symptoms (Summary) Cardiovascular ROS: no chest pain or dyspnea on exertion negative for - edema, irregular heartbeat, orthopnea, palpitations, paroxysmal nocturnal dyspnea, rapid heart rate, shortness of breath, or lightheadedness, dizziness or wooziness, syncope/near syncope or TIA/amaurosis fugax.  Claudication.  No more of the "trembling sensation he felt in his chest "when he presented with MI.  REVIEWED OF SYSTEMS   Review of Systems  Constitutional:  Negative for malaise/fatigue and weight loss.  Respiratory:  Positive for cough (occasional mild cough). Negative for wheezing.   Gastrointestinal:  Negative for blood in stool and melena.  Genitourinary:  Negative for hematuria.  Musculoskeletal:  Negative for joint pain.  Neurological:  Positive for headaches (off & on - ild - associated with cough). Negative for dizziness.  Psychiatric/Behavioral:  Negative for memory loss. The patient is not nervous/anxious and does not have insomnia.     I have reviewed and (if needed) personally updated the patient's problem list, medications, allergies, past medical and surgical history, social and family history.   PAST MEDICAL HISTORY   Past Medical History:  Diagnosis Date   Aortic stenosis, mild 07/2012   Very mild aortic stenosis noted on echo-mean gradient 11 mmHg.   Bile duct leak    CAD in native artery 07/14/2020   Small branch of OM 3 with 80% ostial stenosis.  Too small for PCI.  Otherwise questionable 30% LM and 30% PDA.   Chronic kidney disease 2017   left kidney stones   Headache(784.0)    Hx: of when BP is elevated   Hyperlipidemia    Hypertension    Non-STEMI (non-ST  elevated myocardial infarction) (HCC) 07/13/2020   Admitted with hypertension and chest pain.  Cardiac cath showed small branch of an OM3 with 85% ostial stenosis not amenable to PCI.  No other significant disease noted.  Normal EF on echo.  Medical management.   Numbness and tingling in hands    Hx: of   Prediabetes     PAST SURGICAL HISTORY   Past Surgical History:  Procedure Laterality Date   CHOLECYSTECTOMY N/A 07/11/2012   Procedure: LAPAROSCOPIC CHOLECYSTECTOMY WITH INTRAOPERATIVE CHOLANGIOGRAM;  Surgeon: Axel Filler, MD;  Location: [facility] OR;  Service: General;  Laterality: N/A;   COLONOSCOPY     ERCP  07/16/2012   ERCP N/A 07/16/2012   Procedure: ENDOSCOPIC RETROGRADE CHOLANGIOPANCREATOGRAPHY (ERCP);  Surgeon: Louis Meckel, MD;  Location: [facility] OR;  Service: Gastroenterology;  Laterality: N/A;   ERCP N/A 10/06/2012   Procedure: ENDOSCOPIC RETROGRADE CHOLANGIOPANCREATOGRAPHY (ERCP);  Surgeon: Louis Meckel, MD;  Location: [facility];  Service: Endoscopy;  Laterality:  N/A;   HAND SURGERY     LEFT HEART CATH AND CORONARY ANGIOGRAPHY N/A 07/14/2020   Procedure: LEFT HEART CATH AND CORONARY ANGIOGRAPHY;  Surgeon: Lyn RecordsSmith, Henry W, MD;  Location: [facility] INVASIVE CV LAB;  Service: Cardiovascular; ? Culprit Lesion ~ 80% ostial small OM3 (too small & ostial for PCI). ~30% mid LM (at a bend).  LAD with D1 & D2 - normal, RCA minimal luminal irregularities ~ 30%. PDA   TRANSTHORACIC ECHOCARDIOGRAM  07/14/2020   (NSTEMI/Accelerated Hypertension): EF 55 to 60%.  No RWM A.  Very mild Aortic Stenosis-mean gradient 11 to mmHg.    Immunization History  Administered Date(s) Administered   Influenza,inj,Quad PF,6+ Mos 08/30/2015, 09/20/2017, 09/17/2019   PFIZER(Purple Top)SARS-COV-2 Vaccination 04/17/2019, 05/11/2019   Tdap 08/30/2015   Zoster Recombinat (Shingrix) 05/31/2020    MEDICATIONS/ALLERGIES   Current Meds  Medication Sig   amLODipine (NORVASC) 5 MG tablet Take 1 tablet (5 mg total)  by mouth daily.   aspirin 81 MG EC tablet Take 1 tablet (81 mg total) by mouth daily. Swallow whole.   atorvastatin (LIPITOR) 80 MG tablet Take 1 tablet (80 mg total) by mouth daily.   clopidogrel (PLAVIX) 75 MG tablet Take 1 tablet (75 mg total) by mouth daily.   lisinopril-hydrochlorothiazide (ZESTORETIC) 20-12.5 MG tablet Take 1 tablet by mouth daily.   nitroGLYCERIN (NITROSTAT) 0.4 MG SL tablet Place 1 tablet (0.4 mg total) under the tongue every 5 (five) minutes x 3 doses as needed for chest pain.   pantoprazole (PROTONIX) 40 MG tablet Take 1 tablet (40 mg total) by mouth daily.    No Known Allergies  SOCIAL HISTORY/FAMILY HISTORY   Reviewed in Epic:  Pertinent findings:  Social History   Tobacco Use   Smoking status: Never   Smokeless tobacco: Never  Substance Use Topics   Alcohol use: No   Drug use: No   Social History   Social History Narrative   Originally from [place]. Came to the [place] in [place].   Lives with his wife and their youngest son   His daughter lives in [place], [place] with her husband.  Second son is now moved out of the house as well.   He gets a decent amount of exercise walking around at work, but does not do routine exercise.  Does not drink or smoke    OBJCTIVE -PE, EKG, labs   Wt Readings from Last 3 Encounters:  07/28/20 158 lb (71.7 kg)  07/20/20 158 lb 12.8 oz (72 kg)  07/15/20 160 lb 3.2 oz (72.7 kg)    Physical Exam: BP 120/82   Pulse 61   Ht 5\' 3"  (1.6 m)   Wt 158 lb 12.8 oz (72 kg)   SpO2 95%   BMI 28.13 kg/m  Physical Exam Vitals reviewed.  Constitutional:      General: He is not in acute distress.    Appearance: Normal appearance. He is normal weight. He is not ill-appearing or toxic-appearing.  HENT:     Head: Normocephalic and atraumatic.  Neck:     Vascular: No carotid bruit, hepatojugular reflux or JVD.  Cardiovascular:     Rate and Rhythm: Normal rate and regular rhythm. No extrasystoles are present.    Chest  Wall: PMI is not displaced.     Pulses: Normal pulses.     Heart sounds: S1 normal and S2 normal. Murmur heard.  Harsh crescendo-decrescendo early systolic murmur is present with a grade of 1/6 at the upper right sternal border  radiating to the neck.    No friction rub. No gallop.  Pulmonary:     Effort: Pulmonary effort is normal. No respiratory distress.     Breath sounds: Normal breath sounds. No wheezing, rhonchi or rales.  Chest:     Chest wall: No tenderness.  Musculoskeletal:        General: No swelling. Normal range of motion.     Cervical back: Normal range of motion and neck supple.  Skin:    General: Skin is warm and dry.     Coloration: Skin is not pale.  Neurological:     General: No focal deficit present.     Mental Status: He is alert and oriented to person, place, and time.     Cranial Nerves: No cranial nerve deficit.     Motor: No weakness.     Gait: Gait normal.  Psychiatric:        Mood and Affect: Mood normal.        Behavior: Behavior normal.        Thought Content: Thought content normal.        Judgment: Judgment normal.     Adult ECG Report  Rate: 61 ;  Rhythm: normal sinus rhythm and normal axis, intervals and durations. ;   Narrative Interpretation: Normal EKG.  Recent Labs: Reviewed Lab Results  Component Value Date   CHOL 162 07/14/2020   HDL 40 (L) 07/14/2020   LDLCALC 115 (H) 07/14/2020   TRIG 33 07/14/2020   CHOLHDL 4.1 07/14/2020   Lab Results  Component Value Date   CREATININE 0.98 07/15/2020   BUN 18 07/15/2020   NA 136 07/15/2020   K 3.5 07/15/2020   CL 105 07/15/2020   CO2 25 07/15/2020   CBC Latest Ref Rng & Units 07/15/2020 07/14/2020 07/13/2020  WBC 4.0 - 10.5 K/uL 8.2 7.3 7.3  Hemoglobin 13.0 - 17.0 g/dL 56.9 79.4 17.3(H)  Hematocrit 39.0 - 52.0 % 49.2 51.2 51.0  Platelets 150 - 400 K/uL 186 186 214   Lab Results  Component Value Date   HGBA1C 5.9 (H) 07/14/2020    Lab Results  Component Value Date   TSH 1.970  09/20/2017    ==================================================  COVID-19 Education: The signs and symptoms of COVID-19 were discussed with the patient and how to seek care for testing (follow up with PCP or arrange E-visit).    I spent a total of 29 minutes with the patient spent in direct patient consultation.  Additional time spent with chart review  / charting (studies, outside notes, etc): 16 min Total Time: 45 min  Current medicines are reviewed at length with the patient today.  (+/- concerns) n/a  This visit occurred during the SARS-CoV-2 public health emergency.  Safety protocols were in place, including screening questions prior to the visit, additional usage of staff PPE, and extensive cleaning of exam room while observing appropriate contact time as indicated for disinfecting solutions.  Notice: This dictation was prepared with Dragon dictation along with smaller phrase technology. Any transcriptional errors that result from this process are unintentional and may not be corrected upon review.  Patient Instructions / Medication Changes & Studies & Tests Ordered   Patient Instructions  Medication Instructions:  NO CHANGES WITH MEDICATION  IF YOUR BLOOD PRESSURE IS ELEVATED ABOVE 160  - TAKE A NITROGLYCERIN TABLET -SUBLINGUAL X 1 IF BLOOD PRESSURE IS STILL ELEVATED AFTER ONE HOUR TAKE EXTRA TABLET OF AMLODIPINE 5 MG  *If you need a refill on  your cardiac medications before your next appointment, please call your pharmacy*   Lab Work: HAVE LIPIDS  ( CHOLESTEROL ) FASTING NOTHING TO EAT OR DRINK EXCEPT WATER AND YOUR MEDICATION BEFORE LAB IS DONE)   CHECKED AT PRIMARY'S OFFICE     Testing/Procedures:  NOT NEEDED  Follow-Up: At [facility], you and your health needs are our priority.  As part of our continuing mission to provide you with exceptional heart care, we have created designated Provider Care Teams.  These Care Teams include your primary Cardiologist  (physician) and Advanced Practice Providers (APPs -  Physician Assistants and Nurse Practitioners) who all work together to provide you with the care you need, when you need it.     Your next appointment:   4 month(s)  The format for your next appointment:   In Person  Provider:   You will see one of the following Advanced Practice Providers on your designated Care Team:   Theodore Demark, PA-C [name], PA-C Joni Reining, DNP, ANP  Then, Bryan Lemma, MD will plan to see you again in 8 month(s).     Studies Ordered:   Orders Placed This Encounter  Procedures   Lipid panel   EKG 12-Lead      Bryan Lemma, M.D., M.S. Interventional Cardiologist   Pager # [phone] Phone # [phone] [address] [place], [place] [address]   Thank you for choosing Heartcare at [facility]!!

## 2020-07-20 ENCOUNTER — Other Ambulatory Visit: Payer: Self-pay

## 2020-07-20 ENCOUNTER — Ambulatory Visit (INDEPENDENT_AMBULATORY_CARE_PROVIDER_SITE_OTHER): Payer: 59 | Admitting: Cardiology

## 2020-07-20 VITALS — BP 120/82 | HR 61 | Ht 63.0 in | Wt 158.8 lb

## 2020-07-20 DIAGNOSIS — I214 Non-ST elevation (NSTEMI) myocardial infarction: Secondary | ICD-10-CM

## 2020-07-20 DIAGNOSIS — I10 Essential (primary) hypertension: Secondary | ICD-10-CM | POA: Diagnosis not present

## 2020-07-20 DIAGNOSIS — R7303 Prediabetes: Secondary | ICD-10-CM

## 2020-07-20 DIAGNOSIS — I25119 Atherosclerotic heart disease of native coronary artery with unspecified angina pectoris: Secondary | ICD-10-CM | POA: Diagnosis not present

## 2020-07-20 DIAGNOSIS — E785 Hyperlipidemia, unspecified: Secondary | ICD-10-CM | POA: Diagnosis not present

## 2020-07-20 NOTE — Patient Instructions (Addendum)
Medication Instructions:  NO CHANGES WITH MEDICATION  IF YOUR BLOOD PRESSURE IS ELEVATED ABOVE 160  - TAKE A NITROGLYCERIN TABLET -SUBLINGUAL X 1 IF BLOOD PRESSURE IS STILL ELEVATED AFTER ONE HOUR TAKE EXTRA TABLET OF AMLODIPINE 5 MG  *If you need a refill on your cardiac medications before your next appointment, please call your pharmacy*   Lab Work: HAVE LIPIDS  ( CHOLESTEROL ) FASTING NOTHING TO EAT OR DRINK EXCEPT WATER AND YOUR MEDICATION BEFORE LAB IS DONE)   CHECKED AT PRIMARY'S OFFICE     Testing/Procedures:  NOT NEEDED  Follow-Up: At [facility], you and your health needs are our priority.  As part of our continuing mission to provide you with exceptional heart care, we have created designated Provider Care Teams.  These Care Teams include your primary Cardiologist (physician) and Advanced Practice Providers (APPs -  Physician Assistants and Nurse Practitioners) who all work together to provide you with the care you need, when you need it.     Your next appointment:   4 month(s)  The format for your next appointment:   In Person  Provider:   You will see one of the following Advanced Practice Providers on your designated Care Team:   Theodore Demark, PA-C [name], PA-C Joni Reining, DNP, ANP  Then, Bryan Lemma, MD will plan to see you again in 8 month(s).

## 2020-07-27 ENCOUNTER — Telehealth (HOSPITAL_COMMUNITY): Payer: Self-pay

## 2020-07-27 ENCOUNTER — Other Ambulatory Visit (HOSPITAL_COMMUNITY): Payer: Self-pay

## 2020-07-27 NOTE — Telephone Encounter (Signed)
Pharmacy Transitions of Care Follow-up Telephone Call  Date of discharge: 07/15/20  Discharge Diagnosis: NSTEMI  How have you been since you were released from the hospital?  Patient is Spanish speaking so conversation was had via translator. Patient has been doing well since discharge. Has noticed his blood pressure has been doing better since discharge.  Medication changes made at discharge:     START: amLODipine (NORVASC)  Aspirin Low Dose (aspirin)  clopidogrel (PLAVIX)  nitroGLYCERIN (NITROSTAT)                Pantoprazole (PROTONIX)  CHANGE: atorvastatin (LIPITOR)   STOP: cyclobenzaprine 10 MG tablet (FLEXERIL)  omeprazole 20 MG capsule (PRILOSEC)   Medication changes verified by the patient?  Yes    Medication Accessibility:  Home Pharmacy:  [facility] [place]  Was the patient provided with refills on discharged medications? Yes   Have all prescriptions been transferred from [facility] to home pharmacy?  yes  Is the patient able to afford medications? Patient has [employer] insurance  Medication Review:  CLOPIDOGREL (PLAVIX) Clopidogrel 75mg  mg once daily.  - Educated patient on expected duration of therapy of ASA 81mg  with clopidogrel.  - Advised patient of medications to avoid (NSAIDs, ASA)  - Educated that Tylenol (acetaminophen) will be the preferred analgesic to prevent risk of bleeding  - Emphasized importance of monitoring for signs and symptoms of bleeding (abnormal bruising, prolonged bleeding, nose bleeds, bleeding from gums, discolored urine, black tarry stools)  - Advised patient to alert all providers of anticoagulation therapy prior to starting a new medication or having a procedure    Follow-up Appointments:  PCP Hospital f/u appt confirmed? Yes Scheduled to see Dr. on 08/31/20 @ Internal Med.   Specialist Hospital f/u appt confirmed? Yes Scheduled to see Dr. Alvy Bimler on 11/21/20 @ Cardiology.   If their condition worsens, is the pt  aware to call PCP or go to the Emergency Dept.? Yes  Final Patient Assessment: Patient has refills at home pharmacy and follow up scheduled

## 2020-07-28 ENCOUNTER — Emergency Department (HOSPITAL_COMMUNITY)
Admission: EM | Admit: 2020-07-28 | Discharge: 2020-07-28 | Disposition: A | Discharge status: Home / Self Care | Payer: 59 | Attending: Emergency Medicine | Admitting: Emergency Medicine

## 2020-07-28 ENCOUNTER — Other Ambulatory Visit: Payer: Self-pay

## 2020-07-28 DIAGNOSIS — M25521 Pain in right elbow: Secondary | ICD-10-CM | POA: Diagnosis present

## 2020-07-28 DIAGNOSIS — I129 Hypertensive chronic kidney disease with stage 1 through stage 4 chronic kidney disease, or unspecified chronic kidney disease: Secondary | ICD-10-CM | POA: Insufficient documentation

## 2020-07-28 DIAGNOSIS — I25119 Atherosclerotic heart disease of native coronary artery with unspecified angina pectoris: Secondary | ICD-10-CM | POA: Insufficient documentation

## 2020-07-28 DIAGNOSIS — Z955 Presence of coronary angioplasty implant and graft: Secondary | ICD-10-CM | POA: Diagnosis not present

## 2020-07-28 DIAGNOSIS — Z79899 Other long term (current) drug therapy: Secondary | ICD-10-CM | POA: Insufficient documentation

## 2020-07-28 DIAGNOSIS — M79601 Pain in right arm: Secondary | ICD-10-CM

## 2020-07-28 DIAGNOSIS — N189 Chronic kidney disease, unspecified: Secondary | ICD-10-CM | POA: Diagnosis not present

## 2020-07-28 DIAGNOSIS — Z7982 Long term (current) use of aspirin: Secondary | ICD-10-CM | POA: Insufficient documentation

## 2020-07-28 NOTE — ED Provider Notes (Signed)
[facility] EMERGENCY DEPARTMENT Provider Note   CSN: [id number] Arrival date & time: 07/28/20  0445     History Chief Complaint  Patient presents with   Right Arm Pain    Ethan Silva is a 55 y.o. male.  Patient to ED with right arm pain at the medial elbow that woke him from sleep tonight. He reports his arm was rigid but loosened up after a short period. No swelling, redness. No known injury. He reports having a cardiac catheterization about one month ago in the right arm but no symptoms since that time. No fever. He reports on waking tonight he had chills that have since resolved. No other symptoms.   The history is provided by the patient. A language interpreter was used.      Past Medical History:  Diagnosis Date   Bile duct leak    Chronic kidney disease 2017   left kidney stones   Headache(784.0)    Hx: of when BP is elevated   Hyperlipidemia    Hypertension    Numbness and tingling in hands    Hx: of   Prediabetes     Patient Active Problem List   Diagnosis Date Noted   Coronary artery disease involving native coronary artery of native heart with angina pectoris (HCC) 07/15/2020   Prediabetes 07/15/2020   NSTEMI (non-ST elevated myocardial infarction) (HCC) 07/14/2020   BMI 30.0-30.9,adult 08/30/2015   Hyperlipidemia with target LDL less than 70 08/30/2015   Essential hypertension 10/11/2012    Past Surgical History:  Procedure Laterality Date   CHOLECYSTECTOMY N/A 07/11/2012   Procedure: LAPAROSCOPIC CHOLECYSTECTOMY WITH INTRAOPERATIVE CHOLANGIOGRAM;  Surgeon: Axel Filler, MD;  Location: [facility] OR;  Service: General;  Laterality: N/A;   COLONOSCOPY     ERCP  07/16/2012   ERCP N/A 07/16/2012   Procedure: ENDOSCOPIC RETROGRADE CHOLANGIOPANCREATOGRAPHY (ERCP);  Surgeon: Louis Meckel, MD;  Location: [facility] OR;  Service: Gastroenterology;  Laterality: N/A;   ERCP N/A 10/06/2012   Procedure: ENDOSCOPIC RETROGRADE  CHOLANGIOPANCREATOGRAPHY (ERCP);  Surgeon: Louis Meckel, MD;  Location: [facility];  Service: Endoscopy;  Laterality: N/A;   LEFT HEART CATH AND CORONARY ANGIOGRAPHY N/A 07/14/2020   Procedure: LEFT HEART CATH AND CORONARY ANGIOGRAPHY;  Surgeon: Lyn Records, MD;  Location: [facility] INVASIVE CV LAB;  Service: Cardiovascular;  Laterality: N/A;       Family History  Problem Relation Age of Onset   Gallstones Mother    Diabetes Paternal Uncle    Gallstones Brother     Social History   Tobacco Use   Smoking status: Never   Smokeless tobacco: Never  Substance Use Topics   Alcohol use: No   Drug use: No    Home Medications Prior to Admission medications   Medication Sig Start Date End Date Taking? Authorizing Provider  amLODipine (NORVASC) 5 MG tablet Take 1 tablet (5 mg total) by mouth daily. 07/15/20   O'NealRonnald Ramp, MD  aspirin 81 MG EC tablet Take 1 tablet (81 mg total) by mouth daily. Swallow whole. 07/15/20   O'NealRonnald Ramp, MD  atorvastatin (LIPITOR) 80 MG tablet Take 1 tablet (80 mg total) by mouth daily. 07/15/20   O'NealRonnald Ramp, MD  clopidogrel (PLAVIX) 75 MG tablet Take 1 tablet (75 mg total) by mouth daily. 07/15/20   O'NealRonnald Ramp, MD  lisinopril-hydrochlorothiazide (ZESTORETIC) 20-12.5 MG tablet Take 1 tablet by mouth daily. 05/31/20   Georgina Quint, MD  nitroGLYCERIN (NITROSTAT) 0.4 MG SL  tablet Place 1 tablet (0.4 mg total) under the tongue every 5 (five) minutes x 3 doses as needed for chest pain. 07/15/20   Sande Rives, MD  pantoprazole (PROTONIX) 40 MG tablet Take 1 tablet (40 mg total) by mouth daily. 07/16/20   O'Neal, Ronnald Ramp, MD    Allergies    Patient has no known allergies.  Review of Systems   Review of Systems  Constitutional:  Negative for fever.  Musculoskeletal:        See HPI  Skin:  Negative for color change.  Neurological:  Negative for numbness.   Physical Exam Updated Vital Signs BP (!)  165/99 (BP Location: Left Arm)   Pulse 80   Temp 98.9 F (37.2 C) (Oral)   Resp 20   Ht 5\' 3"  (1.6 m)   Wt 71.7 kg   SpO2 95%   BMI 27.99 kg/m   Physical Exam Vitals and nursing note reviewed.  Constitutional:      Appearance: Normal appearance. He is well-developed.  Pulmonary:     Effort: Pulmonary effort is normal.  Musculoskeletal:        General: Normal range of motion.     Cervical back: Normal range of motion.     Comments: Right arm has no visible swelling or redness. No palpable warmth. Minimal tenderness at the medial elbow. Distal pulses present. No upper arm or right axillary tenderness.   Skin:    General: Skin is warm and dry.  Neurological:     Mental Status: He is alert and oriented to person, place, and time.    ED Results / Procedures / Treatments   Labs (all labs ordered are listed, but only abnormal results are displayed) Labs Reviewed - No data to display  EKG None  Radiology No results found.  Procedures Procedures   Medications Ordered in ED Medications - No data to display  ED Course  I have reviewed the triage vital signs and the nursing notes.  Pertinent labs & imaging results that were available during my care of the patient were reviewed by me and considered in my medical decision making (see chart for details).    MDM Rules/Calculators/A&P                           Patient to ED with right arm pain as detailed in the HPI.   No evidence of infection on exam - no redness, swelling or significant tenderness. No suspicion for DVT or thrombophlebitis. Suspect MSK pain. Recommend 2 day recheck with PCP. Return precautions discussed.   Final Clinical Impression(s) / ED Diagnoses Final diagnoses:  None   Right arm pain  Rx / DC Orders ED Discharge Orders     None        07/28/20 0523    07/30/20, MD 07/28/20 [phone]

## 2020-07-28 NOTE — Discharge Instructions (Addendum)
Take ibuprofen for pain and apply warm compresses if this provides additional comfort.   Follow up with your doctor for recheck in 2 days if pain persists. Return to the emergency department if there is any redness, fever, severe pain.

## 2020-07-28 NOTE — ED Triage Notes (Addendum)
Pt reports acute onset of right sided arm pain today at 300 followed by generalized "shaking all over". Pain around inner elbow. States recent cardiac cath 7/14 without complications. No history DVT. Right distal pulse strong. NO noticeable inflammation. No recent injury. No fevers. Pt on plavix. Denies CP/SOB

## 2020-07-30 ENCOUNTER — Encounter: Payer: Self-pay | Admitting: Cardiology

## 2020-07-30 NOTE — Assessment & Plan Note (Signed)
He did not have an obvious culprit lesion, the only notable stenosis was a small branch of OM3.  Not with PCI.  < 2 cm vessel with ostial lesion --> I suspect the troponin elevation was probably more related to hypotension in the setting of existing disease.  Potentially demand ischemia.  Preserved EF.  No recurrent symptoms.  No heart failure.

## 2020-07-30 NOTE — Assessment & Plan Note (Addendum)
Now increased up to 80 mg atorvastatin having increased from 40 mg.  LDL during his hospital stay was 115.  Recheck in ~3 months.

## 2020-07-30 NOTE — Assessment & Plan Note (Addendum)
Uncontrolled at the time of his hospital stay.  Internal.  He remains on Zestoretic 20-12.5 mg daily along with amlodipine 5 midodrine daily.  He is a beta-blocker because of bradycardia.  With potential spasm, amlodipine was added in the hospital.  PRN additional dose of Amlodipine for SBP > 160 mmHg. Along with NTG SL.

## 2020-07-30 NOTE — Assessment & Plan Note (Signed)
No longer having any chest pain.  I suspect that his elevated troponin and angina is related to hypertension in setting of known vessel disease. The only potential culprit lesion was not PCI amenable.  Plan: Continue aggressive management with high-dose statin and DAPT.  We will continue aspirin Plavix and hopefully for up to 1 year.  Okay to hold for procedures or surgeries.  5 to 7 days depending on procedure.    Beta-blocker because of bradycardia.  We used amlodipine for possible spasm and BP control.Marland Kitchen

## 2020-07-30 NOTE — Assessment & Plan Note (Signed)
A1c 5.9 --> monitor. Discussed healthy diet. No Med Rx for now.

## 2020-08-03 ENCOUNTER — Telehealth (HOSPITAL_COMMUNITY): Payer: Self-pay

## 2020-08-03 NOTE — Telephone Encounter (Signed)
Called and spoke with pt in regards to CR, pt stated he is not interested due to co-pay.   Closed referral

## 2020-08-03 NOTE — Telephone Encounter (Signed)
Pt insurance is active and benefits verified through [facility]. Co-pay 100.00, DED $$0.00/$0.00 met, out of pocket $8,700.00/$1,626.76 met, co-insurance 0%. No pre-authorization required. Le[facility], 08/03/20 @ 3:10PM, YJW#[id number]   Will contact patient to see if he is interested in the Cardiac Rehab Program. If interested, patient will need to complete follow up appt. Once completed, patient will be contacted for scheduling upon review by the RN Navigator.

## 2020-08-26 ENCOUNTER — Emergency Department (HOSPITAL_COMMUNITY)
Admission: EM | Admit: 2020-08-26 | Discharge: 2020-08-27 | Disposition: A | Discharge status: Home / Self Care | Payer: 59 | Attending: Emergency Medicine | Admitting: Emergency Medicine

## 2020-08-26 ENCOUNTER — Other Ambulatory Visit: Payer: Self-pay

## 2020-08-26 ENCOUNTER — Encounter (HOSPITAL_COMMUNITY): Payer: Self-pay | Admitting: Emergency Medicine

## 2020-08-26 ENCOUNTER — Emergency Department (HOSPITAL_COMMUNITY): Payer: 59

## 2020-08-26 DIAGNOSIS — N189 Chronic kidney disease, unspecified: Secondary | ICD-10-CM | POA: Insufficient documentation

## 2020-08-26 DIAGNOSIS — Z7982 Long term (current) use of aspirin: Secondary | ICD-10-CM | POA: Diagnosis not present

## 2020-08-26 DIAGNOSIS — R945 Abnormal results of liver function studies: Secondary | ICD-10-CM | POA: Insufficient documentation

## 2020-08-26 DIAGNOSIS — I129 Hypertensive chronic kidney disease with stage 1 through stage 4 chronic kidney disease, or unspecified chronic kidney disease: Secondary | ICD-10-CM | POA: Insufficient documentation

## 2020-08-26 DIAGNOSIS — U071 COVID-19: Secondary | ICD-10-CM | POA: Diagnosis not present

## 2020-08-26 DIAGNOSIS — Z7902 Long term (current) use of antithrombotics/antiplatelets: Secondary | ICD-10-CM | POA: Insufficient documentation

## 2020-08-26 DIAGNOSIS — R1013 Epigastric pain: Secondary | ICD-10-CM | POA: Diagnosis not present

## 2020-08-26 DIAGNOSIS — R531 Weakness: Secondary | ICD-10-CM | POA: Diagnosis present

## 2020-08-26 DIAGNOSIS — Z79899 Other long term (current) drug therapy: Secondary | ICD-10-CM | POA: Insufficient documentation

## 2020-08-26 DIAGNOSIS — R7989 Other specified abnormal findings of blood chemistry: Secondary | ICD-10-CM

## 2020-08-26 DIAGNOSIS — Z955 Presence of coronary angioplasty implant and graft: Secondary | ICD-10-CM | POA: Insufficient documentation

## 2020-08-26 DIAGNOSIS — I25119 Atherosclerotic heart disease of native coronary artery with unspecified angina pectoris: Secondary | ICD-10-CM | POA: Insufficient documentation

## 2020-08-26 LAB — COMPREHENSIVE METABOLIC PANEL
ALT: 671 U/L — ABNORMAL HIGH (ref 0–44)
AST: 958 U/L — ABNORMAL HIGH (ref 15–41)
Albumin: 4.1 g/dL (ref 3.5–5.0)
Alkaline Phosphatase: 85 U/L (ref 38–126)
Anion gap: 11 (ref 5–15)
BUN: 19 mg/dL (ref 6–20)
CO2: 27 mmol/L (ref 22–32)
Calcium: 8.9 mg/dL (ref 8.9–10.3)
Chloride: 102 mmol/L (ref 98–111)
Creatinine, Ser: 1.39 mg/dL — ABNORMAL HIGH (ref 0.61–1.24)
GFR, Estimated: 59 mL/min — ABNORMAL LOW (ref >60)
Glucose, Bld: 165 mg/dL — ABNORMAL HIGH (ref 70–99)
Potassium: 3.4 mmol/L — ABNORMAL LOW (ref 3.5–5.1)
Sodium: 140 mmol/L (ref 135–145)
Total Bilirubin: 2.3 mg/dL — ABNORMAL HIGH (ref 0.3–1.2)
Total Protein: 6.9 g/dL (ref 6.5–8.1)

## 2020-08-26 LAB — CBC
HCT: 45.7 % (ref 39.0–52.0)
Hemoglobin: 15.8 g/dL (ref 13.0–17.0)
MCH: 30.3 pg (ref 26.0–34.0)
MCHC: 34.6 g/dL (ref 30.0–36.0)
MCV: 87.7 fL (ref 80.0–100.0)
Platelets: 213 10*3/uL (ref 150–400)
RBC: 5.21 MIL/uL (ref 4.22–5.81)
RDW: 13.2 % (ref 11.5–15.5)
WBC: 12.6 10*3/uL — ABNORMAL HIGH (ref 4.0–10.5)
nRBC: 0 % (ref 0.0–0.2)

## 2020-08-26 LAB — LACTIC ACID, PLASMA: Lactic Acid, Venous: 2.5 mmol/L (ref 0.5–1.9)

## 2020-08-26 LAB — TROPONIN I (HIGH SENSITIVITY): Troponin I (High Sensitivity): 5 ng/L (ref <18)

## 2020-08-26 LAB — LIPASE, BLOOD: Lipase: 45 U/L (ref 11–51)

## 2020-08-26 MED ORDER — MORPHINE SULFATE (PF) 4 MG/ML IV SOLN: 4.0000 mg | Freq: Once | INTRAVENOUS | Status: DC

## 2020-08-26 MED ORDER — SODIUM CHLORIDE 0.9 % IV BOLUS
1000.0000 mL | Freq: Once | INTRAVENOUS | Status: AC
  Administered 2020-08-26: 1000 mL via INTRAVENOUS

## 2020-08-26 MED ORDER — FENTANYL CITRATE PF 50 MCG/ML IJ SOSY
50.0000 ug | PREFILLED_SYRINGE | Freq: Once | INTRAMUSCULAR | Status: AC
  Administered 2020-08-27: 50 ug via INTRAVENOUS
  Filled 2020-08-26: qty 1

## 2020-08-26 MED ORDER — ONDANSETRON HCL 4 MG/2ML IJ SOLN
4.0000 mg | Freq: Once | INTRAMUSCULAR | Status: AC
  Administered 2020-08-27: 4 mg via INTRAVENOUS
  Filled 2020-08-26: qty 2

## 2020-08-26 NOTE — ED Triage Notes (Signed)
Pt reports feeling fine until until 4pm today.  Reports upper abdominal pain, weakness, emesis, chills and a cough.  Pt took nitro 30 minutes ago, no change in abd pn.

## 2020-08-26 NOTE — ED Provider Notes (Signed)
[facility] EMERGENCY DEPARTMENT Provider Note   CSN: [id number] Arrival date & time: 08/26/20  2146     History Chief Complaint  Patient presents with   Weakness   Abdominal Pain    Ethan Silva is a 55 y.o. male.  HPI 55 year old male presents with upper abdominal pain.  History is taken with the Spanish interpreter.  This started tonight around 4 PM.  He has been having on and off pain in his epigastrium for about a year but never this bad.  The pain is about a 6.  He took some Alka-Seltzer and nitroglycerin prior to arrival.  He felt a subjective fever, body aches, cough since the symptoms started.  He has been having multiple episodes of emesis with no blood.  No bloody stools or melena.  The pain radiates into his chest when he is vomiting but otherwise no chest pain or shortness of breath. He has had a previous cholecystectomy.  Past Medical History:  Diagnosis Date   Aortic stenosis, mild 07/2012   Very mild aortic stenosis noted on echo-mean gradient 11 mmHg.   Bile duct leak    CAD in native artery 07/14/2020   Small branch of OM 3 with 80% ostial stenosis.  Too small for PCI.  Otherwise questionable 30% LM and 30% PDA.   Chronic kidney disease 2017   left kidney stones   Headache(784.0)    Hx: of when BP is elevated   Hyperlipidemia    Hypertension    Non-STEMI (non-ST elevated myocardial infarction) (HCC) 07/13/2020   Admitted with hypertension and chest pain.  Cardiac cath showed small branch of an OM3 with 85% ostial stenosis not amenable to PCI.  No other significant disease noted.  Normal EF on echo.  Medical management.   Numbness and tingling in hands    Hx: of   Prediabetes     Patient Active Problem List   Diagnosis Date Noted   Coronary artery disease involving native coronary artery of native heart with angina pectoris (HCC) 07/15/2020   Prediabetes 07/15/2020   NSTEMI (non-ST elevated myocardial infarction) (HCC)  07/14/2020   BMI 30.0-30.9,adult 08/30/2015   Hyperlipidemia with target LDL less than 70 08/30/2015   Essential hypertension 10/11/2012    Past Surgical History:  Procedure Laterality Date   CHOLECYSTECTOMY N/A 07/11/2012   Procedure: LAPAROSCOPIC CHOLECYSTECTOMY WITH INTRAOPERATIVE CHOLANGIOGRAM;  Surgeon: Axel Filler, MD;  Location: [facility] OR;  Service: General;  Laterality: N/A;   COLONOSCOPY     ERCP  07/16/2012   ERCP N/A 07/16/2012   Procedure: ENDOSCOPIC RETROGRADE CHOLANGIOPANCREATOGRAPHY (ERCP);  Surgeon: Louis Meckel, MD;  Location: [facility] OR;  Service: Gastroenterology;  Laterality: N/A;   ERCP N/A 10/06/2012   Procedure: ENDOSCOPIC RETROGRADE CHOLANGIOPANCREATOGRAPHY (ERCP);  Surgeon: Louis Meckel, MD;  Location: [facility];  Service: Endoscopy;  Laterality: N/A;   HAND SURGERY     LEFT HEART CATH AND CORONARY ANGIOGRAPHY N/A 07/14/2020   Procedure: LEFT HEART CATH AND CORONARY ANGIOGRAPHY;  Surgeon: Lyn Records, MD;  Location: [facility] INVASIVE CV LAB;  Service: Cardiovascular; ? Culprit Lesion ~ 80% ostial small OM3 (too small & ostial for PCI). ~30% mid LM (at a bend).  LAD with D1 & D2 - normal, RCA minimal luminal irregularities ~ 30%. PDA   TRANSTHORACIC ECHOCARDIOGRAM  07/14/2020   (NSTEMI/Accelerated Hypertension): EF 55 to 60%.  No RWM A.  Very mild Aortic Stenosis-mean gradient 11 to mmHg.       Family History  Problem Relation Age of Onset   Gallstones Mother    Hypertension Mother    Gallstones Brother    Diabetes Paternal Uncle     Social History   Tobacco Use   Smoking status: Never   Smokeless tobacco: Never  Substance Use Topics   Alcohol use: No   Drug use: No    Home Medications Prior to Admission medications   Medication Sig Start Date End Date Taking? Authorizing Provider  ondansetron (ZOFRAN ODT) 4 MG disintegrating tablet Take 1 tablet (4 mg total) by mouth every 8 (eight) hours as needed for nausea or vomiting. 08/27/20  Yes Pricilla Loveless, MD  amLODipine (NORVASC) 5 MG tablet Take 1 tablet (5 mg total) by mouth daily. 07/15/20   O'NealRonnald Ramp, MD  aspirin 81 MG EC tablet Take 1 tablet (81 mg total) by mouth daily. Swallow whole. 07/15/20   O'NealRonnald Ramp, MD  atorvastatin (LIPITOR) 80 MG tablet Take 1 tablet (80 mg total) by mouth daily. 07/15/20   O'NealRonnald Ramp, MD  clopidogrel (PLAVIX) 75 MG tablet Take 1 tablet (75 mg total) by mouth daily. 07/15/20   O'NealRonnald Ramp, MD  lisinopril-hydrochlorothiazide (ZESTORETIC) 20-12.5 MG tablet Take 1 tablet by mouth daily. 05/31/20   Georgina Quint, MD  nitroGLYCERIN (NITROSTAT) 0.4 MG SL tablet Place 1 tablet (0.4 mg total) under the tongue every 5 (five) minutes x 3 doses as needed for chest pain. 07/15/20   Sande Rives, MD  pantoprazole (PROTONIX) 40 MG tablet Take 1 tablet (40 mg total) by mouth daily. 07/16/20   O'Neal, Ronnald Ramp, MD    Allergies    Patient has no known allergies.  Review of Systems   Review of Systems  Constitutional:  Positive for chills and fever (subjective).  Respiratory:  Positive for cough. Negative for shortness of breath.   Cardiovascular:  Positive for chest pain.  Gastrointestinal:  Positive for abdominal pain and vomiting.  Musculoskeletal:  Positive for myalgias.  All other systems reviewed and are negative.  Physical Exam Updated Vital Signs BP 128/90 (BP Location: Right Arm)   Pulse 74   Temp 98.5 F (36.9 C) (Oral)   Resp 19   Ht 5\' 3"  (1.6 m)   Wt 72.6 kg   SpO2 97%   BMI 28.34 kg/m   Physical Exam Vitals and nursing note reviewed.  Constitutional:      General: He is not in acute distress.    Appearance: He is well-developed. He is not ill-appearing or diaphoretic.  HENT:     Head: Normocephalic and atraumatic.     Right Ear: External ear normal.     Left Ear: External ear normal.     Nose: Nose normal.  Eyes:     General:        Right eye: No discharge.        Left eye: No  discharge.  Cardiovascular:     Rate and Rhythm: Normal rate and regular rhythm.     Heart sounds: Murmur heard.  Pulmonary:     Effort: Pulmonary effort is normal.     Breath sounds: Normal breath sounds.  Abdominal:     Palpations: Abdomen is soft.     Tenderness: There is abdominal tenderness (mild) in the epigastric area.  Musculoskeletal:     Cervical back: Neck supple.  Skin:    General: Skin is warm and dry.  Neurological:     Mental Status: He is alert.  Psychiatric:  Mood and Affect: Mood is not anxious.    ED Results / Procedures / Treatments   Labs (all labs ordered are listed, but only abnormal results are displayed) Labs Reviewed  RESP PANEL BY RT-PCR (FLU A&B, COVID) ARPGX2 - Abnormal; Notable for the following components:      Result Value   SARS Coronavirus 2 by RT PCR POSITIVE (*)    All other components within normal limits  COMPREHENSIVE METABOLIC PANEL - Abnormal; Notable for the following components:   Potassium 3.4 (*)    Glucose, Bld 165 (*)    Creatinine, Ser 1.39 (*)    AST 958 (*)    ALT 671 (*)    Total Bilirubin 2.3 (*)    GFR, Estimated 59 (*)    All other components within normal limits  CBC - Abnormal; Notable for the following components:   WBC 12.6 (*)    All other components within normal limits  URINALYSIS, ROUTINE W REFLEX MICROSCOPIC - Abnormal; Notable for the following components:   pH 9.0 (*)    Hgb urine dipstick SMALL (*)    Bacteria, UA RARE (*)    All other components within normal limits  LACTIC ACID, PLASMA - Abnormal; Notable for the following components:   Lactic Acid, Venous 2.5 (*)    All other components within normal limits  LIPASE, BLOOD  LACTIC ACID, PLASMA  PROTIME-INR  HEPATITIS PANEL, ACUTE  TROPONIN I (HIGH SENSITIVITY)    EKG EKG Interpretation  Date/Time:  Friday August 26 2020 22:05:57 EDT Ventricular Rate:  63 PR Interval:  150 QRS Duration: 94 QT Interval:  418 QTC Calculation: 427 R  Axis:   40 Text Interpretation: Normal sinus rhythm Incomplete right bundle branch block Possible Anterior infarct , age undetermined  similar to July 2022 Confirmed by Pricilla LovelessGoldston, Siah Steely [phone]([id number]) on 08/26/2020 10:59:39 PM  Radiology CT ABDOMEN PELVIS W CONTRAST  Result Date: 08/27/2020 CLINICAL DATA:  Upper abdominal pain with weakness and vomiting. EXAM: CT ABDOMEN AND PELVIS WITH CONTRAST TECHNIQUE: Multidetector CT imaging of the abdomen and pelvis was performed using the standard protocol following bolus administration of intravenous contrast. CONTRAST:  100mL OMNIPAQUE IOHEXOL 350 MG/ML SOLN COMPARISON:  January 21, 2017 FINDINGS: Lower chest: No acute abnormality. Hepatobiliary: No focal liver abnormality is seen. Status post cholecystectomy. No biliary dilatation. Pancreas: Unremarkable. No pancreatic ductal dilatation or surrounding inflammatory changes. Spleen: Normal in size without focal abnormality. Adrenals/Urinary Tract: Adrenal glands are unremarkable. Kidneys are normal in size, without renal calculi or hydronephrosis. A 2.9 cm x 2.5 cm exophytic simple cyst is seen along the anterior aspect of the mid left kidney. Multiple smaller cysts are noted within the mid and lower left kidney. The urinary bladder is moderately distended and otherwise normal in appearance. Stomach/Bowel: Stomach is within normal limits. Appendix appears normal. No evidence of bowel dilatation. The proximal to mid transverse colon is mildly thickened and poorly distended. Vascular/Lymphatic: Mild aortic atherosclerosis. 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 No enlarged abdominal or pelvic lymph nodes. Reproductive: There is mild prostate gland enlargement. Other: No abdominal wall hernia or abnormality. No abdominopelvic ascites. Musculoskeletal: No acute or significant osseous findings. IMPRESSION: 1. Thickened and poorly distended  transverse colon. Sequelae associated with mild colitis can not be excluded. 2. Multiple predominant stable simple renal cysts. 3. Evidence of prior cholecystectomy. 4. Aortic atherosclerosis. Aortic Atherosclerosis (ICD10-I70.0). Electronically Signed   By: Waylan Rocherhaddeus  Houston M.D.   On: 08/27/2020 01:40   DG Chest Portable 1 View  Result Date: 08/27/2020 CLINICAL DATA:  Acute epigastric pain and cough. EXAM: PORTABLE CHEST 1 VIEW COMPARISON:  July 13, 2020 FINDINGS: The heart size and mediastinal contours are within normal limits. Both lungs are clear. The visualized skeletal structures are unremarkable. IMPRESSION: No active cardiopulmonary disease. Electronically Signed   By: Aram Candela M.D.   On: 08/27/2020 00:02    Procedures Procedures   Medications Ordered in ED Medications  sodium chloride 0.9 % bolus 1,000 mL (0 mLs Intravenous Stopped 08/27/20 0120)  ondansetron (ZOFRAN) injection 4 mg (4 mg Intravenous Given 08/27/20 0017)  fentaNYL (SUBLIMAZE) injection 50 mcg (50 mcg Intravenous Given 08/27/20 0018)  lactated ringers bolus 1,000 mL (0 mLs Intravenous Stopped 08/27/20 0336)  iohexol (OMNIPAQUE) 350 MG/ML injection 100 mL (100 mLs Intravenous Contrast Given 08/27/20 0123)  potassium chloride SA (KLOR-CON) CR tablet 40 mEq (40 mEq Oral Given 08/27/20 0534)    ED Course  I have reviewed the triage vital signs and the nursing notes.  Pertinent labs & imaging results that were available during my care of the patient were reviewed by me and considered in my medical decision making (see chart for details).    MDM Rules/Calculators/A&P                           Given patient's abdominal tenderness with vomiting, CT was obtained.  CT is overall unremarkable.  Labs are most consistent with acute LFT abnormalities.  I discussed this with Dr. Elnoria Howard, who recommends hepatitis panel, INR, and follow-up closely with [place] for recheck.  He denies alcohol or significant Tylenol use besides  here and there use.  After all of this discussion, his COVID test came back positive.  This likely explains all of his symptoms including his myalgias, cough, vomiting.  Could explain some abnormal LFTs though this is a little higher than typically seen.  We discussed Paxlovid options through the interpreter and he is interested so I will refer him to pharmacy.  Otherwise, he is not hypoxic or in distress and his vitals are normal.  He appears stable for discharge home with return precautions. Final Clinical Impression(s) / ED Diagnoses Final diagnoses:  COVID-19  Abnormal LFTs    Rx / DC Orders ED Discharge Orders          Ordered    ondansetron (ZOFRAN ODT) 4 MG disintegrating tablet  Every 8 hours PRN        08/27/20 0401             Pricilla Loveless, MD 08/27/20 [phone]

## 2020-08-27 ENCOUNTER — Emergency Department (HOSPITAL_COMMUNITY): Payer: 59

## 2020-08-27 LAB — URINALYSIS, ROUTINE W REFLEX MICROSCOPIC
Bilirubin Urine: NEGATIVE
Glucose, UA: NEGATIVE mg/dL
Ketones, ur: NEGATIVE mg/dL
Leukocytes,Ua: NEGATIVE
Nitrite: NEGATIVE
Protein, ur: NEGATIVE mg/dL
Specific Gravity, Urine: 1.013 (ref 1.005–1.030)
pH: 9 — ABNORMAL HIGH (ref 5.0–8.0)

## 2020-08-27 LAB — RESP PANEL BY RT-PCR (FLU A&B, COVID) ARPGX2
Influenza A by PCR: NEGATIVE
Influenza B by PCR: NEGATIVE
SARS Coronavirus 2 by RT PCR: POSITIVE — AB

## 2020-08-27 LAB — HEPATITIS PANEL, ACUTE
HCV Ab: NONREACTIVE
Hep A IgM: NONREACTIVE
Hep B C IgM: NONREACTIVE
Hepatitis B Surface Ag: NONREACTIVE

## 2020-08-27 LAB — PROTIME-INR
INR: 1.1 (ref 0.8–1.2)
Prothrombin Time: 14.6 seconds (ref 11.4–15.2)

## 2020-08-27 LAB — LACTIC ACID, PLASMA: Lactic Acid, Venous: 1.3 mmol/L (ref 0.5–1.9)

## 2020-08-27 MED ORDER — POTASSIUM CHLORIDE CRYS ER 20 MEQ PO TBCR
40.0000 meq | EXTENDED_RELEASE_TABLET | Freq: Once | ORAL | Status: AC
  Administered 2020-08-27: 40 meq via ORAL
  Filled 2020-08-27: qty 2

## 2020-08-27 MED ORDER — IOHEXOL 350 MG/ML SOLN
100.0000 mL | Freq: Once | INTRAVENOUS | Status: AC | PRN
  Administered 2020-08-27: 100 mL via INTRAVENOUS

## 2020-08-27 MED ORDER — ONDANSETRON 4 MG PO TBDP: 4.0000 mg | ORAL_TABLET | Freq: Three times a day (TID) | ORAL | 0 refills | Status: DC | PRN

## 2020-08-27 MED ORDER — LACTATED RINGERS IV BOLUS
1000.0000 mL | Freq: Once | INTRAVENOUS | Status: AC
  Administered 2020-08-27: 1000 mL via INTRAVENOUS

## 2020-08-27 NOTE — Discharge Instructions (Addendum)
You may qualify for Paxlovid, a medication that can reduce the severity of COVID symptoms and increase your ability to survive the infection. However, you need to contact the pharmacy and complete a short telephone interview to determine your eligibility. You can contact either one of these two however there are no other pharmacies participating:  [facility] at [facility] [phone], [address], [address], [place], [place] [address]  [facility] at [facility] [phone], [address] [place], [place] [address]  The pharmacist will review some medications and health history and determine your need.   If your symptoms worsen, please return to the ED   Puede calificar para Paxlovid, un medicamento que puede reducir la gravedad de los sntomas de COVID y aumentar su capacidad para sobrevivir a la infeccin. Sin embargo, debe comunicarse con la farmacia y completar una breve entrevista telefnica para determinar su elegibilidad. Puede comunicarse con cualquiera de [address], sin embargo, no hay otras farmacias participantes: [facility] en [place] [phone], [address], [place], [place] [address]  [facility] para pacientes ambulatorios de [facility] en [facility] [phone], [address] [place], [place] [address]  El farmacutico revisar algunos medicamentos e historial de salud y Chief Strategy Officer su necesidad.  Si sus sntomas empeoran, vuelva al servicio de urgencias.   You will need repeat liver tests early next week.  Call your family doctor and the gastroenterologist provided.  Do not drink alcohol and avoid Tylenol if possible.  Necesitar repetir las pruebas de hgado a principios de la prxima semana. Llame a su mdico de familia y al gastroenterlogo proporcionado. No beba alcohol y evite Tylenol si es posible.

## 2020-08-27 NOTE — ED Notes (Signed)
Patient transported to CT 

## 2020-08-29 ENCOUNTER — Telehealth: Payer: Self-pay

## 2020-08-29 DIAGNOSIS — R7989 Other specified abnormal findings of blood chemistry: Secondary | ICD-10-CM

## 2020-08-29 NOTE — Telephone Encounter (Signed)
No I didn't notice that.   He should follow up with Dr. Kinnie Scales instead. Advise him to call today for an appt.  Cancel the above recs.  Thanks

## 2020-08-29 NOTE — Telephone Encounter (Signed)
Dr Elnoria Howard please see the messages below. The pt should be seen by Dr Kinnie Scales.  Thank you

## 2020-08-29 NOTE — Telephone Encounter (Signed)
Dr Christella Hartigan the pt also has established care with Dr Kinnie Scales in 2019

## 2020-08-29 NOTE — Telephone Encounter (Signed)
Dr Christella Hartigan the pt was put on Dr Mansouraty's schedule for this week. However, Dr Meridee Score called and tells me that the pt has COVID.  Please advise

## 2020-08-29 NOTE — Addendum Note (Signed)
Addended by: Corliss Parish on: 08/29/2020 10:26 AM   Modules accepted: Orders

## 2020-08-29 NOTE — Telephone Encounter (Signed)
-----   Message from Rachael Fee, MD sent at 08/28/2020  9:42 AM EDT ----- Will do.  Eloise Picone, This man was in ER over the weekend.  Needs follow up in our office this coming week, LFTs a day prior. Any available provider.    Thanks   ----- Message ----- From: Jeani Hawking, MD Sent: 08/27/2020   1:03 PM EDT To: Rachael Fee, MD  Dan,   Can you have somebody on your group follow up with this patient.  He was evaluated in the ER and sent home.  I thought he could go home provided that he has rapid follow up for his elevated liver enzymes.  Luisa Hart

## 2020-08-29 NOTE — Telephone Encounter (Signed)
Ethan Silva can you call this pt and give him his appt please with Dr Meridee Score on 09/01/20 at 1010 am.  He will also need to come in for labs a day or 2 prior. I have entered the lab.  He is spanish speaking.  Thank you

## 2020-08-29 NOTE — Telephone Encounter (Signed)
Dr Christella Hartigan did you see the message regarding Dr Kinnie Scales?

## 2020-08-30 ENCOUNTER — Emergency Department (HOSPITAL_COMMUNITY): Payer: 59

## 2020-08-30 ENCOUNTER — Encounter (HOSPITAL_COMMUNITY): Payer: Self-pay | Admitting: Emergency Medicine

## 2020-08-30 ENCOUNTER — Emergency Department (HOSPITAL_COMMUNITY)
Admission: EM | Admit: 2020-08-30 | Discharge: 2020-08-30 | Disposition: A | Discharge status: Home / Self Care | Payer: 59 | Attending: Emergency Medicine | Admitting: Emergency Medicine

## 2020-08-30 ENCOUNTER — Other Ambulatory Visit: Payer: Self-pay

## 2020-08-30 DIAGNOSIS — Z7902 Long term (current) use of antithrombotics/antiplatelets: Secondary | ICD-10-CM | POA: Diagnosis not present

## 2020-08-30 DIAGNOSIS — I25119 Atherosclerotic heart disease of native coronary artery with unspecified angina pectoris: Secondary | ICD-10-CM | POA: Diagnosis not present

## 2020-08-30 DIAGNOSIS — I129 Hypertensive chronic kidney disease with stage 1 through stage 4 chronic kidney disease, or unspecified chronic kidney disease: Secondary | ICD-10-CM | POA: Diagnosis not present

## 2020-08-30 DIAGNOSIS — Z79899 Other long term (current) drug therapy: Secondary | ICD-10-CM | POA: Insufficient documentation

## 2020-08-30 DIAGNOSIS — R7989 Other specified abnormal findings of blood chemistry: Secondary | ICD-10-CM

## 2020-08-30 DIAGNOSIS — K5900 Constipation, unspecified: Secondary | ICD-10-CM | POA: Insufficient documentation

## 2020-08-30 DIAGNOSIS — N189 Chronic kidney disease, unspecified: Secondary | ICD-10-CM | POA: Insufficient documentation

## 2020-08-30 DIAGNOSIS — Z7982 Long term (current) use of aspirin: Secondary | ICD-10-CM | POA: Diagnosis not present

## 2020-08-30 LAB — URINALYSIS, ROUTINE W REFLEX MICROSCOPIC
Bilirubin Urine: NEGATIVE
Glucose, UA: NEGATIVE mg/dL
Ketones, ur: NEGATIVE mg/dL
Leukocytes,Ua: NEGATIVE
Nitrite: NEGATIVE
Protein, ur: 30 mg/dL — AB
Specific Gravity, Urine: 1.032 — ABNORMAL HIGH (ref 1.005–1.030)
pH: 5 (ref 5.0–8.0)

## 2020-08-30 LAB — COMPREHENSIVE METABOLIC PANEL
ALT: 623 U/L — ABNORMAL HIGH (ref 0–44)
AST: 254 U/L — ABNORMAL HIGH (ref 15–41)
Albumin: 3.5 g/dL (ref 3.5–5.0)
Alkaline Phosphatase: 213 U/L — ABNORMAL HIGH (ref 38–126)
Anion gap: 9 (ref 5–15)
BUN: 16 mg/dL (ref 6–20)
CO2: 24 mmol/L (ref 22–32)
Calcium: 8.8 mg/dL — ABNORMAL LOW (ref 8.9–10.3)
Chloride: 106 mmol/L (ref 98–111)
Creatinine, Ser: 1 mg/dL (ref 0.61–1.24)
GFR, Estimated: >60 mL/min (ref >60)
Glucose, Bld: 111 mg/dL — ABNORMAL HIGH (ref 70–99)
Potassium: 3.7 mmol/L (ref 3.5–5.1)
Sodium: 139 mmol/L (ref 135–145)
Total Bilirubin: 1.4 mg/dL — ABNORMAL HIGH (ref 0.3–1.2)
Total Protein: 7.2 g/dL (ref 6.5–8.1)

## 2020-08-30 LAB — CBC WITH DIFFERENTIAL/PLATELET
Abs Immature Granulocytes: 0.01 10*3/uL (ref 0.00–0.07)
Basophils Absolute: 0 10*3/uL (ref 0.0–0.1)
Basophils Relative: 1 %
Eosinophils Absolute: 0.1 10*3/uL (ref 0.0–0.5)
Eosinophils Relative: 2 %
HCT: 48.3 % (ref 39.0–52.0)
Hemoglobin: 16.4 g/dL (ref 13.0–17.0)
Immature Granulocytes: 0 %
Lymphocytes Relative: 21 %
Lymphs Abs: 1.4 10*3/uL (ref 0.7–4.0)
MCH: 30.1 pg (ref 26.0–34.0)
MCHC: 34 g/dL (ref 30.0–36.0)
MCV: 88.8 fL (ref 80.0–100.0)
Monocytes Absolute: 0.8 10*3/uL (ref 0.1–1.0)
Monocytes Relative: 12 %
Neutro Abs: 4.3 10*3/uL (ref 1.7–7.7)
Neutrophils Relative %: 64 %
Platelets: 187 10*3/uL (ref 150–400)
RBC: 5.44 MIL/uL (ref 4.22–5.81)
RDW: 13.9 % (ref 11.5–15.5)
WBC: 6.7 10*3/uL (ref 4.0–10.5)
nRBC: 0 % (ref 0.0–0.2)

## 2020-08-30 MED ORDER — SODIUM CHLORIDE 0.9 % IV BOLUS
1000.0000 mL | Freq: Once | INTRAVENOUS | Status: AC
  Administered 2020-08-30: 1000 mL via INTRAVENOUS

## 2020-08-30 MED ORDER — IOHEXOL 350 MG/ML SOLN
100.0000 mL | Freq: Once | INTRAVENOUS | Status: AC | PRN
  Administered 2020-08-30: 100 mL via INTRAVENOUS

## 2020-08-30 NOTE — ED Provider Notes (Signed)
[facility] EMERGENCY DEPARTMENT Provider Note   CSN: [id number] Arrival date & time: 08/30/20  7829     History Chief Complaint  Patient presents with   Constipation    Ethan Silva is a 55 y.o. male with a hx of CKD, hypertension, hyperlipidemia, CAD, cholecystectomy, and prediabetes who presents to the ED with complaints of problems with constipation since his last emergency department visit.  Patient states that he has not had a normal bowel movement since leaving the emergency department, he has been able to pass a very small firm hard stool, he is having associated abdominal discomfort with this, worse when he tries to have a bowel movement or when he eats.  No alleviating factors.  Has had some nausea but no vomiting.  He denies fever, emesis, melena, hematochezia, bright red blood per rectum, dysuria, or syncope.  He has not been taking Tylenol or drinking alcohol since his last ED visit.  He states that he has not yet scheduled a follow-up with GI. Interpreter utilized throughout Sales executive.  HPI     Past Medical History:  Diagnosis Date   Aortic stenosis, mild 07/2012   Very mild aortic stenosis noted on echo-mean gradient 11 mmHg.   Bile duct leak    CAD in native artery 07/14/2020   Small branch of OM 3 with 80% ostial stenosis.  Too small for PCI.  Otherwise questionable 30% LM and 30% PDA.   Chronic kidney disease 2017   left kidney stones   Headache(784.0)    Hx: of when BP is elevated   Hyperlipidemia    Hypertension    Non-STEMI (non-ST elevated myocardial infarction) ([place]) 07/13/2020   Admitted with hypertension and chest pain.  Cardiac cath showed small branch of an OM3 with 85% ostial stenosis not amenable to PCI.  No other significant disease noted.  Normal EF on echo.  Medical management.   Numbness and tingling in hands    Hx: of   Prediabetes     Patient Active Problem List   Diagnosis Date Noted   Coronary artery  disease involving native coronary artery of native heart with angina pectoris ([place]) 07/15/2020   Prediabetes 07/15/2020   NSTEMI (non-ST elevated myocardial infarction) ([place]) 07/14/2020   BMI 30.0-30.9,adult 08/30/2015   Hyperlipidemia with target LDL less than 70 08/30/2015   Essential hypertension 10/11/2012    Past Surgical History:  Procedure Laterality Date   CHOLECYSTECTOMY N/A 07/11/2012   Procedure: LAPAROSCOPIC CHOLECYSTECTOMY WITH INTRAOPERATIVE CHOLANGIOGRAM;  Surgeon: Ralene Ok, MD;  Location: [facility];  Service: General;  Laterality: N/A;   COLONOSCOPY     ERCP  07/16/2012   ERCP N/A 07/16/2012   Procedure: ENDOSCOPIC RETROGRADE CHOLANGIOPANCREATOGRAPHY (ERCP);  Surgeon: Inda Castle, MD;  Location: [facility];  Service: Gastroenterology;  Laterality: N/A;   ERCP N/A 10/06/2012   Procedure: ENDOSCOPIC RETROGRADE CHOLANGIOPANCREATOGRAPHY (ERCP);  Surgeon: Inda Castle, MD;  Location: [facility];  Service: Endoscopy;  Laterality: N/A;   HAND SURGERY     LEFT HEART CATH AND CORONARY ANGIOGRAPHY N/A 07/14/2020   Procedure: LEFT HEART CATH AND CORONARY ANGIOGRAPHY;  Surgeon: Belva Crome, MD;  Location: [facility];  Service: Cardiovascular; ? Culprit Lesion ~ 80% ostial small OM3 (too small & ostial for PCI). ~30% mid LM (at a bend).  LAD with D1 & D2 - normal, RCA minimal luminal irregularities ~ 30%. PDA   TRANSTHORACIC ECHOCARDIOGRAM  07/14/2020   (NSTEMI/Accelerated Hypertension): EF 55 to 60%.  No RWM A.  Very mild Aortic Stenosis-mean gradient 11 to mmHg.       Family History  Problem Relation Age of Onset   Gallstones Mother    Hypertension Mother    Gallstones Brother    Diabetes Paternal Uncle     Social History   Tobacco Use   Smoking status: Never   Smokeless tobacco: Never  Substance Use Topics   Alcohol use: No   Drug use: No    Home Medications Prior to Admission medications   Medication Sig Start Date End Date Taking? Authorizing  Provider  amLODipine (NORVASC) 5 MG tablet Take 1 tablet (5 mg total) by mouth daily. 07/15/20   O'NealCassie Freer, MD  aspirin 81 MG EC tablet Take 1 tablet (81 mg total) by mouth daily. Swallow whole. 07/15/20   O'NealCassie Freer, MD  atorvastatin (LIPITOR) 80 MG tablet Take 1 tablet (80 mg total) by mouth daily. 07/15/20   O'NealCassie Freer, MD  clopidogrel (PLAVIX) 75 MG tablet Take 1 tablet (75 mg total) by mouth daily. 07/15/20   O'NealCassie Freer, MD  lisinopril-hydrochlorothiazide (ZESTORETIC) 20-12.5 MG tablet Take 1 tablet by mouth daily. 05/31/20   Horald Pollen, MD  nitroGLYCERIN (NITROSTAT) 0.4 MG SL tablet Place 1 tablet (0.4 mg total) under the tongue every 5 (five) minutes x 3 doses as needed for chest pain. 07/15/20   Geralynn Rile, MD  ondansetron (ZOFRAN ODT) 4 MG disintegrating tablet Take 1 tablet (4 mg total) by mouth every 8 (eight) hours as needed for nausea or vomiting. 08/27/20   Sherwood Gambler, MD  pantoprazole (PROTONIX) 40 MG tablet Take 1 tablet (40 mg total) by mouth daily. 07/16/20   O'Neal, Cassie Freer, MD    Allergies    Patient has no known allergies.  Review of Systems   Review of Systems  Constitutional:  Negative for fever.  Gastrointestinal:  Positive for abdominal pain, constipation and nausea. Negative for anal bleeding, blood in stool and vomiting.  Genitourinary:  Negative for dysuria.  Neurological:  Negative for syncope.  All other systems reviewed and are negative.  Physical Exam Updated Vital Signs BP (!) 136/97 (BP Location: Left Arm)   Pulse 62   Temp 98.2 F (36.8 C) (Oral)   Resp 18   Ht _0  (1.6 m)   Wt 81 kg   SpO2 97%   BMI 31.63 kg/m   Physical Exam Vitals and nursing note reviewed.  Constitutional:      General: He is not in acute distress.    Appearance: He is well-developed. He is not toxic-appearing.  HENT:     Head: Normocephalic and atraumatic.  Eyes:     General: No scleral icterus.        Right eye: No discharge.        Left eye: No discharge.     Conjunctiva/sclera: Conjunctivae normal.  Cardiovascular:     Rate and Rhythm: Normal rate and regular rhythm.  Pulmonary:     Effort: Pulmonary effort is normal. No respiratory distress.     Breath sounds: Normal breath sounds. No wheezing, rhonchi or rales.  Abdominal:     General: There is no distension.     Palpations: Abdomen is soft.     Tenderness: There is abdominal tenderness (Epigastrium and periumbilical.). There is no guarding or rebound.  Musculoskeletal:     Cervical back: Neck supple.  Skin:    General: Skin is warm and dry.  Findings: No rash.  Neurological:     Mental Status: He is alert.     Comments: Clear speech.   Psychiatric:        Behavior: Behavior normal.    ED Results / Procedures / Treatments   Labs (all labs ordered are listed, but only abnormal results are displayed) Labs Reviewed  COMPREHENSIVE METABOLIC PANEL - Abnormal; Notable for the following components:      Result Value   Glucose, Bld 111 (*)    Calcium 8.8 (*)    AST 254 (*)    ALT 623 (*)    Alkaline Phosphatase 213 (*)    Total Bilirubin 1.4 (*)    All other components within normal limits  URINALYSIS, ROUTINE W REFLEX MICROSCOPIC - Abnormal; Notable for the following components:   Color, Urine AMBER (*)    APPearance HAZY (*)    Specific Gravity, Urine 1.032 (*)    Hgb urine dipstick SMALL (*)    Protein, ur 30 (*)    Bacteria, UA RARE (*)    All other components within normal limits  CBC WITH DIFFERENTIAL/PLATELET    EKG None  Radiology No results found.  Procedures Procedures   Medications Ordered in ED Medications - No data to display  ED Course  I have reviewed the triage vital signs and the nursing notes.  Pertinent labs & imaging results that were available during my care of the patient were reviewed by me and considered in my medical decision making (see chart for details).    MDM  Rules/Calculators/A&P                           Patient presents to the ED with complaints of constipation and abdominal discomfort.  Patient is nontoxic, resting comfortably, vitals with somewhat elevated blood pressure, doubt hypertensive emergency.  Epigastric and periumbilical tenderness.  No peritoneal signs. Additional history obtained:  Additional history obtained from chart review & nursing note review.   Patient seen in the emergency department 08/26/2020 for multiple complaints, found to be COVID-positive, LFTs were elevated, CT was obtained and detailed below, ultimately care was discussed with GI service who recommended hepatitis panel, INR, and office follow-up.  Patient appears to have tried to follow-up with GI, he had seen Dr.Medoff in the past and recommended he follow up with his office, patient has not scheduled this yet. Hepatitis panel negative.   CT A/P: 08/26/20:  IMPRESSION: 1. Thickened and poorly distended transverse colon. Sequelae associated with mild colitis can not be excluded. 2. Multiple predominant stable simple renal cysts. 3. Evidence of prior cholecystectomy. 4. Aortic atherosclerosis.  Aortic Atherosclerosis (ICD10-I70.0).   Lab Tests:  I reviewed and interpreted labs, which included:  CBC: Unremarkable.  CMP: Transaminitis and t bili elevation which is improved from most recent visit, however mild worsening of alk phos.  Urinalysis: Elevated specific gravity, no UTI.  Fluids ordered.  Will repeat CT abdomen/pelvis with contrast to further assess.   I called and spoke with clinic staff @ Dr. Liliane Channel office with GI-patient has not been seen in clinic for 3 years therefore he would be a new patient and Dr. Earlean Shawl is retiring, plan was to attempt to get him scheduled with an alternative provider in clinic, however given patient's insurance is [employer] he is only able to see [place] providers.  We will provide [facility] information for follow-up,  however given patient is having improving LFTs may also follow-up  with primary care provider for this as well- discussed w/ attending who is in agreement.  CT abdomen/pelvis is pending at change of shift.  If no significant abnormalities anticipate discharge home with treatment for constipation.  Findings and plan of care discussed with supervising physician Dr. Roslynn Amble who is in agreement.   Portions of this note were generated with Lobbyist. Dictation errors may occur despite best attempts at proofreading.  Final Clinical Impression(s) / ED Diagnoses Final diagnoses:  None    Rx / DC Orders ED Discharge Orders     None        Amaryllis Dyke, PA-C 08/30/20 1558    Lucrezia Starch, MD 09/01/20 [phone]

## 2020-08-30 NOTE — ED Notes (Signed)
EDP at the bedside.  ?

## 2020-08-30 NOTE — ED Notes (Signed)
Spoke with pt using translator. Pt stated he believes he is constipated. He feels like he has to use the restroom but only small bits come out. Pt denies any pain and taking any medication for constipation. Pt states he eats a lot of vegetables, meat, smoothies, and juices.

## 2020-08-30 NOTE — Discharge Instructions (Addendum)
Eat a diet rich in fiber and drink plenty of water. Exercise 15-20 minutes per day if possible.  Bowel routine steps:  Step 1: Take 2 Senokot pills each day at bedtime. If no bowel movement after 2 days progress to step 2. Step 2: Take 2 Senokot pills in the morning and 2 in the evening. If no bowel movement in one day progress to step 3. Step 3: Take 3 Senokot pills and 1 capful of MiraLAX in the morning and in the evening. If no bowel movement after one day, start step 4. Step 4: Take 4 Senokot pills and one capful of MiraLAX in the morning, another capful of MiraLAX at lunch, and 4 Senokot pills and a capful of MiraLAX in the evening  *Talk to your doctor if you are still constipated after following these 4 steps. Don't take more than 8 Senokot pills a day. If loose stools develop, go back to the previous step. Do not stop the bowel routine completely.  It was a pleasure taking care of you today. As discussed, your CT images showed that you are constipated. Above is a regimen. You liver enzymes are elevated, but improving from your recent ED visit.  I have included the number of the GI doctor.  Please call tomorrow to schedule appointment for further evaluation.  Return to the ER for new or worsening symptoms.

## 2020-08-30 NOTE — ED Provider Notes (Signed)
Care assumed from [facility], PA-C at shift change pending CT abdomen.  See her note for full HPI.  In short, patient is a 55 year old male with a history of CKD, hypertension, hyperlipidemia, CAD, history of cholecystectomy and prediabetes who presents to the ED due to constipation.  Patient was evaluated in the ED on 8/26 or patient tested positive for COVID-19.  CT abdomen showed possible mild colitis.  Patient was also found to have elevated LFTs.  GI was consulted at that time who recommended outpatient follow-up.  Hepatitis panel negative and PT/INR within normal limits.  Denies chronic Tylenol and alcohol use.  Patient has not followed up with GI in the outpatient setting yet. He returns today due to persistent constipation.   Plan from previous provider: follow-up with CT abdomen and facilitate GI follow-up. Patient's insurance only accepted a [facility].   Physical Exam  BP (!) 136/97 (BP Location: Left Arm)   Pulse 62   Temp 98.2 F (36.8 C) (Oral)   Resp 18   Ht 5' 3"  (1.6 m)   Wt 81 kg   SpO2 97%   BMI 31.63 kg/m   Physical Exam Vitals and nursing note reviewed.  Constitutional:      General: He is not in acute distress.    Appearance: He is not ill-appearing.  HENT:     Head: Normocephalic.  Eyes:     Pupils: Pupils are equal, round, and reactive to light.  Cardiovascular:     Rate and Rhythm: Normal rate and regular rhythm.     Pulses: Normal pulses.     Heart sounds: Normal heart sounds. No murmur heard.   No friction rub. No gallop.  Pulmonary:     Effort: Pulmonary effort is normal.     Breath sounds: Normal breath sounds.  Abdominal:     General: Abdomen is flat. There is no distension.     Palpations: Abdomen is soft.     Tenderness: There is abdominal tenderness. There is no guarding or rebound.  Musculoskeletal:        General: Normal range of motion.     Cervical back: Neck supple.  Skin:    General: Skin is warm and dry.  Neurological:      General: No focal deficit present.     Mental Status: He is alert.  Psychiatric:        Mood and Affect: Mood normal.        Behavior: Behavior normal.    ED Course/Procedures    Results for orders placed or performed during the hospital encounter of 08/30/20 (from the past 24 hour(s))  Urinalysis, Routine w reflex microscopic Urine, Clean Catch     Status: Abnormal   Collection Time: 08/30/20  7:00 AM  Result Value Ref Range   Color, Urine AMBER (A) YELLOW   APPearance HAZY (A) CLEAR   Specific Gravity, Urine 1.032 (H) 1.005 - 1.030   pH 5.0 5.0 - 8.0   Glucose, UA NEGATIVE NEGATIVE mg/dL   Hgb urine dipstick SMALL (A) NEGATIVE   Bilirubin Urine NEGATIVE NEGATIVE   Ketones, ur NEGATIVE NEGATIVE mg/dL   Protein, ur 30 (A) NEGATIVE mg/dL   Nitrite NEGATIVE NEGATIVE   Leukocytes,Ua NEGATIVE NEGATIVE   RBC / HPF 6-10 0 - 5 RBC/hpf   WBC, UA 0-5 0 - 5 WBC/hpf   Bacteria, UA RARE (A) NONE SEEN   Squamous Epithelial / LPF 0-5 0 - 5   Mucus PRESENT    Ca Oxalate Crys,  UA PRESENT   CBC with Differential     Status: None   Collection Time: 08/30/20  7:01 AM  Result Value Ref Range   WBC 6.7 4.0 - 10.5 K/uL   RBC 5.44 4.22 - 5.81 MIL/uL   Hemoglobin 16.4 13.0 - 17.0 g/dL   HCT 48.3 39.0 - 52.0 %   MCV 88.8 80.0 - 100.0 fL   MCH 30.1 26.0 - 34.0 pg   MCHC 34.0 30.0 - 36.0 g/dL   RDW 13.9 11.5 - 15.5 %   Platelets 187 150 - 400 K/uL   nRBC 0.0 0.0 - 0.2 %   Neutrophils Relative % 64 %   Neutro Abs 4.3 1.7 - 7.7 K/uL   Lymphocytes Relative 21 %   Lymphs Abs 1.4 0.7 - 4.0 K/uL   Monocytes Relative 12 %   Monocytes Absolute 0.8 0.1 - 1.0 K/uL   Eosinophils Relative 2 %   Eosinophils Absolute 0.1 0.0 - 0.5 K/uL   Basophils Relative 1 %   Basophils Absolute 0.0 0.0 - 0.1 K/uL   Immature Granulocytes 0 %   Abs Immature Granulocytes 0.01 0.00 - 0.07 K/uL  Comprehensive metabolic panel     Status: Abnormal   Collection Time: 08/30/20  7:01 AM  Result Value Ref Range   Sodium 139  135 - 145 mmol/L   Potassium 3.7 3.5 - 5.1 mmol/L   Chloride 106 98 - 111 mmol/L   CO2 24 22 - 32 mmol/L   Glucose, Bld 111 (H) 70 - 99 mg/dL   BUN 16 6 - 20 mg/dL   Creatinine, Ser 1.00 0.61 - 1.24 mg/dL   Calcium 8.8 (L) 8.9 - 10.3 mg/dL   Total Protein 7.2 6.5 - 8.1 g/dL   Albumin 3.5 3.5 - 5.0 g/dL   AST 254 (H) 15 - 41 U/L   ALT 623 (H) 0 - 44 U/L   Alkaline Phosphatase 213 (H) 38 - 126 U/L   Total Bilirubin 1.4 (H) 0.3 - 1.2 mg/dL   GFR, Estimated >60 >60 mL/min   Anion gap 9 5 - 15    Procedures  MDM   55 year old male presents to the ED due to constipation.  Patient evaluated a few days ago in the ED where he was diagnosed with COVID-19.  Patient found to have transaminitis.  LFTs have mildly improved.  Hepatitis panel normal.  INR/PT from a few days ago within normal limits.  Alk phos elevated.  Plan from previous provider is to follow-up with CT images.  Patient can follow-up with GI in the outpatient setting for transaminitis.  CT abdomen personally reviewed which demonstrates large stool burden.  No signs of obstruction or perforation.  Patient given enema here in the ED with small bowel movement. Patient discharged with constipation regimen.  Patient advised to follow-up with GI regards to constipation and transaminitis.  EKG personally reviewed which demonstrates normal sinus rhythm with no signs of acute ischemia. Strict ED precautions discussed with patient. Patient states understanding and agrees to plan. Patient discharged home in no acute distress and stable vitals     Karie Kirks 08/30/20 2003    Tegeler, Gwenyth Allegra, MD 08/31/20 [phone]

## 2020-08-30 NOTE — ED Notes (Signed)
Pt had liquid stool with 4 small pieces of feces. Pt states he still fill stopped up.

## 2020-08-30 NOTE — ED Triage Notes (Signed)
Patient reports constipation with hard stools for 4 days , denies emesis or fever .

## 2020-08-31 ENCOUNTER — Ambulatory Visit: Payer: 59 | Admitting: Emergency Medicine

## 2020-08-31 NOTE — Telephone Encounter (Addendum)
Fyi, Pt called and was informed that about appt that was cancelled and to contact Dr. Jennye Boroughs office. He was provided with Dr. Jennye Boroughs office contact information.

## 2020-09-01 ENCOUNTER — Ambulatory Visit: Payer: 59 | Admitting: Gastroenterology

## 2020-09-13 ENCOUNTER — Other Ambulatory Visit: Payer: Self-pay

## 2020-09-13 ENCOUNTER — Encounter: Payer: Self-pay | Admitting: Emergency Medicine

## 2020-09-13 ENCOUNTER — Ambulatory Visit (INDEPENDENT_AMBULATORY_CARE_PROVIDER_SITE_OTHER): Payer: 59 | Admitting: Emergency Medicine

## 2020-09-13 VITALS — BP 130/70 | HR 82 | Temp 98.2°F | Ht 63.0 in | Wt 151.0 lb

## 2020-09-13 DIAGNOSIS — R7303 Prediabetes: Secondary | ICD-10-CM

## 2020-09-13 DIAGNOSIS — Z23 Encounter for immunization: Secondary | ICD-10-CM

## 2020-09-13 DIAGNOSIS — R7401 Elevation of levels of liver transaminase levels: Secondary | ICD-10-CM | POA: Diagnosis not present

## 2020-09-13 DIAGNOSIS — E785 Hyperlipidemia, unspecified: Secondary | ICD-10-CM

## 2020-09-13 DIAGNOSIS — I10 Essential (primary) hypertension: Secondary | ICD-10-CM

## 2020-09-13 DIAGNOSIS — I25119 Atherosclerotic heart disease of native coronary artery with unspecified angina pectoris: Secondary | ICD-10-CM

## 2020-09-13 LAB — COMPREHENSIVE METABOLIC PANEL
ALT: 59 U/L — ABNORMAL HIGH (ref 0–53)
AST: 27 U/L (ref 0–37)
Albumin: 4.3 g/dL (ref 3.5–5.2)
Alkaline Phosphatase: 74 U/L (ref 39–117)
BUN: 16 mg/dL (ref 6–23)
CO2: 31 mEq/L (ref 19–32)
Calcium: 9.5 mg/dL (ref 8.4–10.5)
Chloride: 101 mEq/L (ref 96–112)
Creatinine, Ser: 0.9 mg/dL (ref 0.40–1.50)
GFR: 94.82 mL/min (ref >60.00)
Glucose, Bld: 100 mg/dL — ABNORMAL HIGH (ref 70–99)
Potassium: 3.7 mEq/L (ref 3.5–5.1)
Sodium: 140 mEq/L (ref 135–145)
Total Bilirubin: 0.7 mg/dL (ref 0.2–1.2)
Total Protein: 7.6 g/dL (ref 6.0–8.3)

## 2020-09-13 LAB — CBC WITH DIFFERENTIAL/PLATELET
Basophils Absolute: 0 10*3/uL (ref 0.0–0.1)
Basophils Relative: 0.6 % (ref 0.0–3.0)
Eosinophils Absolute: 0.1 10*3/uL (ref 0.0–0.7)
Eosinophils Relative: 1.8 % (ref 0.0–5.0)
HCT: 47.2 % (ref 39.0–52.0)
Hemoglobin: 15.8 g/dL (ref 13.0–17.0)
Lymphocytes Relative: 25.1 % (ref 12.0–46.0)
Lymphs Abs: 1.7 10*3/uL (ref 0.7–4.0)
MCHC: 33.4 g/dL (ref 30.0–36.0)
MCV: 89.7 fl (ref 78.0–100.0)
Monocytes Absolute: 0.5 10*3/uL (ref 0.1–1.0)
Monocytes Relative: 6.7 % (ref 3.0–12.0)
Neutro Abs: 4.4 10*3/uL (ref 1.4–7.7)
Neutrophils Relative %: 65.8 % (ref 43.0–77.0)
Platelets: 272 10*3/uL (ref 150.0–400.0)
RBC: 5.27 Mil/uL (ref 4.22–5.81)
RDW: 14.5 % (ref 11.5–15.5)
WBC: 6.7 10*3/uL (ref 4.0–10.5)

## 2020-09-13 NOTE — Assessment & Plan Note (Signed)
Diet and nutrition discussed lipid profile done today.  Continue atorvastatin 80 mg daily.

## 2020-09-13 NOTE — Progress Notes (Signed)
[address] [place] 55 y.o.   Chief Complaint  Patient presents with   Hypertension    3 month follow up    HISTORY OF PRESENT ILLNESS: This is a 55 y.o. male with history of hypertension here for follow-up. Presently taking amlodipine 5 mg daily with normal blood pressure readings at home. Recent COVID infection last month when he was also found to have elevated liver enzymes.  Negative hepatitis panel.  Unremarkable CT abdomen pelvis.  Was seen in the emergency department.  Non-smoker and no EtOH user.  Recovering well. Earlier this year was found to have coronary artery disease not amenable to intervention. Medical management was recommended.  DAPT for 1 year recommended. Most recent cardiologist office visit last July assessment and plan as follows: ASSESSMENT/PLAN    Problem List Items Addressed This Visit       Coronary artery disease involving native coronary artery of native heart with angina pectoris (HCC) - Primary (Chronic)      No longer having any chest pain.  I suspect that his elevated troponin and angina is related to hypertension in setting of known vessel disease. The only potential culprit lesion was not PCI amenable.   Plan: Continue aggressive management with high-dose statin and DAPT.  We will continue aspirin Plavix and hopefully for up to 1 year.  Okay to hold for procedures or surgeries.  5 to 7 days depending on procedure.   Beta-blocker because of bradycardia.  We used amlodipine for possible spasm and BP control..    Essential hypertension (Chronic)     Uncontrolled at the time of his hospital stay.   Internal.  He remains on Zestoretic 20-12.5 mg daily along with amlodipine 5 midodrine daily.  He is a beta-blocker because of bradycardia.  With potential spasm, amlodipine was added in the hospital.   PRN additional dose of Amlodipine for SBP > 160 mmHg. Along with NTG SL.          Relevant Orders   EKG 12-Lead (Completed)   Hyperlipidemia  with target LDL less than 70 (Chronic)     Now increased up to 80 mg atorvastatin having increased from 40 mg.  LDL during his hospital stay was 115.   Recheck in ~3 months.         Relevant Orders   EKG 12-Lead (Completed)   Lipid panel   Prediabetes (Chronic)     A1c 5.9 --> monitor. Discussed healthy diet. No Med Rx for now.          Relevant Orders   EKG 12-Lead (Completed)   NSTEMI (non-ST elevated myocardial infarction) (HCC) (Chronic)     He did not have an obvious culprit lesion, the only notable stenosis was a small branch of OM3.  Not with PCI.  < 2 cm vessel with ostial lesion --> I suspect the troponin elevation was probably more related to hypotension in the setting of existing disease.  Potentially demand ischemia.   Preserved EF.  No recurrent symptoms.  No heart failure.         Relevant Orders   EKG 12-Lead (Completed)   Lipid panel  Hypertension Pertinent negatives include no chest pain, headaches, neck pain, palpitations or shortness of breath.    Prior to Admission medications   Medication Sig Start Date End Date Taking? Authorizing Provider  amLODipine (NORVASC) 5 MG tablet Take 1 tablet (5 mg total) by mouth daily. 07/15/20  Yes O'Neal, Ronnald Ramp, MD  atorvastatin (LIPITOR) 80 MG tablet Take  1 tablet (80 mg total) by mouth daily. 07/15/20  Yes O'Neal, Ronnald Ramp, MD  clopidogrel (PLAVIX) 75 MG tablet Take 1 tablet (75 mg total) by mouth daily. 07/15/20  Yes O'Neal, Ronnald Ramp, MD  nitroGLYCERIN (NITROSTAT) 0.4 MG SL tablet Place 1 tablet (0.4 mg total) under the tongue every 5 (five) minutes x 3 doses as needed for chest pain. 07/15/20  Yes O'Neal, Ronnald Ramp, MD  pantoprazole (PROTONIX) 40 MG tablet Take 1 tablet (40 mg total) by mouth daily. 07/16/20  Yes O'Neal, Ronnald Ramp, MD  aspirin 81 MG EC tablet Take 1 tablet (81 mg total) by mouth daily. Swallow whole. 07/15/20   O'NealRonnald Ramp, MD  lisinopril-hydrochlorothiazide  (ZESTORETIC) 20-12.5 MG tablet Take 1 tablet by mouth daily. Patient not taking: Reported on 09/13/2020 05/31/20   Georgina Quint, MD  ondansetron (ZOFRAN ODT) 4 MG disintegrating tablet Take 1 tablet (4 mg total) by mouth every 8 (eight) hours as needed for nausea or vomiting. Patient not taking: Reported on 09/13/2020 08/27/20   Pricilla Loveless, MD    No Known Allergies  Patient Active Problem List   Diagnosis Date Noted   Coronary artery disease involving native coronary artery of native heart with angina pectoris (HCC) 07/15/2020   Prediabetes 07/15/2020   NSTEMI (non-ST elevated myocardial infarction) (HCC) 07/14/2020   BMI 30.0-30.9,adult 08/30/2015   Hyperlipidemia with target LDL less than 70 08/30/2015   Essential hypertension 10/11/2012    Past Medical History:  Diagnosis Date   Aortic stenosis, mild 07/2012   Very mild aortic stenosis noted on echo-mean gradient 11 mmHg.   Bile duct leak    CAD in native artery 07/14/2020   Small branch of OM 3 with 80% ostial stenosis.  Too small for PCI.  Otherwise questionable 30% LM and 30% PDA.   Chronic kidney disease 2017   left kidney stones   Headache(784.0)    Hx: of when BP is elevated   Hyperlipidemia    Hypertension    Non-STEMI (non-ST elevated myocardial infarction) (HCC) 07/13/2020   Admitted with hypertension and chest pain.  Cardiac cath showed small branch of an OM3 with 85% ostial stenosis not amenable to PCI.  No other significant disease noted.  Normal EF on echo.  Medical management.   Numbness and tingling in hands    Hx: of   Prediabetes     Past Surgical History:  Procedure Laterality Date   CHOLECYSTECTOMY N/A 07/11/2012   Procedure: LAPAROSCOPIC CHOLECYSTECTOMY WITH INTRAOPERATIVE CHOLANGIOGRAM;  Surgeon: Axel Filler, MD;  Location: [facility] OR;  Service: General;  Laterality: N/A;   COLONOSCOPY     ERCP  07/16/2012   ERCP N/A 07/16/2012   Procedure: ENDOSCOPIC RETROGRADE CHOLANGIOPANCREATOGRAPHY  (ERCP);  Surgeon: Louis Meckel, MD;  Location: [facility] OR;  Service: Gastroenterology;  Laterality: N/A;   ERCP N/A 10/06/2012   Procedure: ENDOSCOPIC RETROGRADE CHOLANGIOPANCREATOGRAPHY (ERCP);  Surgeon: Louis Meckel, MD;  Location: [facility];  Service: Endoscopy;  Laterality: N/A;   HAND SURGERY     LEFT HEART CATH AND CORONARY ANGIOGRAPHY N/A 07/14/2020   Procedure: LEFT HEART CATH AND CORONARY ANGIOGRAPHY;  Surgeon: Lyn Records, MD;  Location: [facility] INVASIVE CV [facility];  Service: Cardiovascular; ? Culprit Lesion ~ 80% ostial small OM3 (too small & ostial for PCI). ~30% mid LM (at a bend).  LAD with D1 & D2 - normal, RCA minimal luminal irregularities ~ 30%. PDA   TRANSTHORACIC ECHOCARDIOGRAM  07/14/2020   (NSTEMI/Accelerated Hypertension): EF 55 to  60%.  No RWM A.  Very mild Aortic Stenosis-mean gradient 11 to mmHg.    Social History   Socioeconomic History   Marital status: Married    Spouse name: Davonna Belling   Number of children: 3   Years of education: 6th grade   Highest education level: Not on file  Occupational History   Occupation: Painting    Employer: [employer] [place]    Comment: Mostly indoor painting  Tobacco Use   Smoking status: Never   Smokeless tobacco: Never  Substance and Sexual Activity   Alcohol use: No   Drug use: No   Sexual activity: Yes    Partners: Female  Other Topics Concern   Not on file  Social History Narrative   Originally from [place], [place]. Came to the [place] in 1991.   Lives with his wife and their youngest son   His daughter lives in [place], [place] with her husband.  Second son is now moved out of the house as well.   He gets a decent amount of exercise walking around at work, but does not do routine exercise.  Does not drink or smoke   Social Determinants of Health   Financial Resource Strain: Not on file  Food Insecurity: Not on file  Transportation Needs: Not on file  Physical Activity: Not on file   Stress: Not on file  Social Connections: Not on file  Intimate Partner Violence: Not on file    Family History  Problem Relation Age of Onset   Gallstones Mother    Hypertension Mother    Gallstones Brother    Diabetes Paternal Uncle      Review of Systems  Constitutional: Negative.  Negative for chills and fever.  HENT: Negative.  Negative for congestion and sore throat.   Respiratory: Negative.  Negative for cough and shortness of breath.   Cardiovascular: Negative.  Negative for chest pain and palpitations.  Gastrointestinal: Negative.  Negative for abdominal pain, blood in stool, diarrhea, melena, nausea and vomiting.  Genitourinary: Negative.  Negative for dysuria and hematuria.  Musculoskeletal:  Negative for back pain, myalgias and neck pain.  Skin: Negative.  Negative for rash.  Neurological:  Negative for dizziness and headaches.  All other systems reviewed and are negative.  Vitals:   09/13/20 1038  BP: (!) 142/80  Pulse: 82  Temp: 98.2 F (36.8 C)  SpO2: 95%   Wt Readings from Last 3 Encounters:  09/13/20 151 lb (68.5 kg)  08/30/20 178 lb 9.2 oz (81 kg)  08/27/20 160 lb (72.6 kg)    Physical Exam Vitals reviewed.  Constitutional:      Appearance: Normal appearance.  HENT:     Head: Normocephalic.  Eyes:     Extraocular Movements: Extraocular movements intact.     Pupils: Pupils are equal, round, and reactive to light.  Cardiovascular:     Rate and Rhythm: Normal rate and regular rhythm.     Pulses: Normal pulses.     Heart sounds: Normal heart sounds.  Pulmonary:     Effort: Pulmonary effort is normal.     Breath sounds: Normal breath sounds.  Musculoskeletal:        General: Normal range of motion.     Cervical back: Normal range of motion and neck supple.  Skin:    General: Skin is warm and dry.     Capillary Refill: Capillary refill takes less than 2 seconds.  Neurological:     General: No focal deficit  present.     Mental Status: He is  alert and oriented to person, place, and time.  Psychiatric:        Mood and Affect: Mood normal.        Behavior: Behavior normal.     ASSESSMENT & PLAN: Coronary artery disease involving native coronary artery of native heart with angina pectoris (HCC) Stable.  No chest pain or recent anginal symptoms.  No need for sublingual nitroglycerin use.  Essential hypertension Well-controlled hypertension with normal blood pressure readings at home.  Continue Lipitor 5 mg daily.  Dietary approaches to stop hypertension discussed.  Hyperlipidemia with target LDL less than 70 Diet and nutrition discussed lipid profile done today.  Continue atorvastatin 80 mg daily.  Prediabetes Diet and nutrition discussed.  Advised to reduce daily carbohydrate intake.  Ethan Silva was seen today for hypertension.  Diagnoses and all orders for this visit:  Essential hypertension  Need for influenza vaccination -     Flu Vaccine QUAD 32mo+IM (Fluarix, Fluzone & Alfiuria Quad PF)  Dyslipidemia  Transaminitis -     Comprehensive metabolic panel -     CBC with Differential/Platelet  Prediabetes  Coronary artery disease involving native coronary artery of native heart with angina pectoris (HCC)  Hyperlipidemia with target LDL less than 70  Patient Instructions  Hipertensin en los adultos Hypertension, Adult El trmino hipertensin es otra forma de denominar a la presin arterial elevada. La presin arterial elevada fuerza al corazn a trabajar ms para bombear la sangre. Esto puede causar problemas con el paso del [place]. Una lectura de presin arterial est compuesta por 2 nmeros. Hay un nmero superior (sistlico) sobre un nmero inferior (diastlico). Lo ideal es tener la presin arterial por debajo de 120/80. Las elecciones saludables pueden ayudar a Personal assistant presin arterial, o tal vez necesite medicamentos para bajarla. Cules son las causas? Se desconoce la causa de esta afeccin. Algunas  afecciones pueden estar relacionadas con la presin arterial alta. Qu incrementa el riesgo? Fumar. Tener diabetes mellitus tipo 2, colesterol alto, o ambos. No hacer la cantidad suficiente de actividad fsica o ejercicio. Tener sobrepeso. Consumir mucha grasa, azcar, caloras o sal (sodio) en su dieta. Beber alcohol en exceso. Tener una enfermedad renal a largo plazo (crnica). Tener antecedentes familiares de presin arterial alta. Edad. Los riesgos aumentan con la edad. Raza. El riesgo es mayor para las Statistician. Sexo. Antes de los 45 aos, los hombres corren ms [employer]. Despus de los 65 aos, las mujeres corren ms [employer]. Tener apnea obstructiva del sueo. Estrs. Cules son los signos o los sntomas? Es posible que la presin arterial alta puede no cause sntomas. La presin arterial muy alta (crisis hipertensiva) puede provocar: Dolor de cabeza. Sensaciones de preocupacin o nerviosismo (ansiedad). Falta de aire. Hemorragia nasal. Sensacin de malestar en el estmago (nuseas). Vmitos. Cambios en la forma de ver. Dolor muy intenso en el pecho. Convulsiones. Cmo se trata? Esta afeccin se trata haciendo cambios saludables en el estilo de vida, por ejemplo: Consumir alimentos saludables. Hacer ms ejercicio. Beber menos alcohol. El mdico puede recetarle medicamentos si los cambios en el estilo de vida no son suficientes para Museum/gallery curator la presin arterial y si: El nmero de arriba est por encima de 130. El nmero de abajo est por encima de 80. Su presin arterial personal ideal puede variar. Siga estas instrucciones en su casa: Comida y bebida  Si se lo dicen, siga el plan de alimentacin de  DASH (Dietary Approaches to Stop Hypertension, Maneras de alimentarse para detener la hipertensin). Para seguir este plan: Llene la mitad del plato de cada comida con frutas y verduras. Llene un cuarto del plato de cada  comida con cereales integrales. Los cereales integrales incluyen pasta integral, arroz integral y pan integral. Coma y beba productos lcteos con bajo contenido de grasa, como leche descremada o yogur bajo en grasas. Llene un cuarto del plato de cada comida con protenas bajas en grasa (magras). Las protenas bajas en grasa incluyen pescado, pollo sin piel, huevos, frijoles y tofu. Evite consumir carne grasa, carne curada y procesada, o pollo con piel. Evite consumir alimentos prehechos o procesados. Consuma menos de 1500 mg de sal por da. No beba alcohol si: El mdico le indica que no lo haga. Est embarazada, puede estar embarazada o est tratando de [place]. Si bebe alcohol: Limite la cantidad que bebe a lo siguiente: De 0 a 1 medida por da para las mujeres. De 0 a 2 medidas por da para los hombres. Est atento a la cantidad de alcohol que hay en las bebidas que toma. En los [address], una medida equivale a una botella de cerveza de 12 oz (355 ml), un vaso de vino de 5 oz (148 ml) o un vaso de una bebida alcohlica de alta graduacin de 1 oz (44 ml). Estilo de vida  Trabaje con su mdico para mantenerse en un peso saludable o para perder peso. Pregntele a su mdico cul es el peso recomendable para usted. Haga al menos 30 minutos de ejercicio la [employer] de la [place]. Estos pueden incluir caminar, nadar o andar en bicicleta. Realice al menos 30 minutos de ejercicio que fortalezca sus msculos (ejercicios de resistencia) al menos 3 das a la [place]. Estos pueden incluir levantar pesas o hacer Pilates. No consuma ningn producto que contenga nicotina o tabaco, como cigarrillos, cigarrillos electrnicos y tabaco de Theatre manager. Si necesita ayuda para dejar de fumar, consulte al [employer]. Controle su presin arterial en su casa tal como le indic el mdico. Concurra a todas las visitas de seguimiento como se lo haya indicado el mdico. Esto es  importante. Medicamentos [employer] de venta libre y los recetados solamente como se lo haya indicado el mdico. Siga cuidadosamente las indicaciones. No omita las dosis de medicamentos para la presin arterial. Los medicamentos pierden eficacia si omite dosis. El hecho de omitir las dosis tambin [place] el riesgo de otros problemas. Pregntele a su mdico a qu efectos secundarios o reacciones a los Museum/gallery curator. Comunquese con un mdico si: Piensa que tiene [place] reaccin a los medicamentos que est tomando. Tiene dolores de cabeza frecuentes (recurrentes). Se siente mareado. Tiene hinchazn en los tobillos. Tiene problemas de visin. Solicite ayuda inmediatamente si: Siente un dolor de cabeza muy intenso. Empieza a sentirse desorientado (confundido). Se siente dbil o adormecido. Siente que va a desmayarse. Tiene un dolor muy intenso en las siguientes zonas: Pecho. Vientre (abdomen). Vomita ms de una vez. Tiene dificultad para respirar. Resumen El trmino hipertensin es otra forma de denominar a la presin arterial elevada. La presin arterial elevada fuerza al corazn a trabajar ms para bombear la sangre. Para la [employer], una presin arterial normal es menor que 120/80. Las decisiones saludables pueden ayudarle a disminuir su presin arterial. Si no puede bajar su presin arterial mediante decisiones saludables, es posible que deba tomar medicamentos. Esta informacin no tiene Theme park manager el consejo  del mdico. Asegrese de hacerle al mdico cualquier pregunta que tenga. Document Revised: 10/03/2017 Document Reviewed: 10/03/2017 Elsevier Patient Education  2022 Elsevier Inc.    Edwina Barth, MD [facility]

## 2020-09-13 NOTE — Patient Instructions (Signed)
Hipertensi?n en los adultos ?Hypertension, Adult ?El t?rmino hipertensi?n es otra forma de denominar a la presi?n arterial elevada. La presi?n arterial elevada fuerza al coraz?n a trabajar m?s para bombear la sangre. Esto puede causar problemas con el paso del tiempo. ?Una lectura de presi?n arterial est? compuesta por 2 n?meros. Hay un n?mero superior (sist?lico) sobre un n?mero inferior (diast?lico). Lo ideal es tener la presi?n arterial por debajo de 120/80. Las elecciones saludables pueden ayudar a bajar la presi?n arterial, o tal vez necesite medicamentos para bajarla. ??Cu?les son las causas? ?Se desconoce la causa de esta afecci?n. Algunas afecciones pueden estar relacionadas con la presi?n arterial alta. ??Qu? incrementa el riesgo? ?Fumar. ?Tener diabetes mellitus tipo 2, colesterol alto, o ambos. ?No hacer la cantidad suficiente de actividad f?sica o ejercicio. ?Tener sobrepeso. ?Consumir mucha grasa, az?car, calor?as o sal (sodio) en su dieta. ?Beber alcohol en exceso. ?Tener una enfermedad renal a largo plazo (cr?nica). ?Tener antecedentes familiares de presi?n arterial alta. ?Edad. Los riesgos aumentan con la edad. ?Raza. El riesgo es mayor para las personas afroamericanas. ?Sexo. Antes de los 45 a?os, los hombres corren m?s riesgo que las mujeres. Despu?s de los 65 a?os, las mujeres corren m?s riesgo que los hombres. ?Tener apnea obstructiva del sue?o. ?Estr?s. ??Cu?les son los signos o los s?ntomas? ?Es posible que la presi?n arterial alta puede no cause s?ntomas. La presi?n arterial muy alta (crisis hipertensiva) puede provocar: ?Dolor de cabeza. ?Sensaciones de preocupaci?n o nerviosismo (ansiedad). ?Falta de aire. ?Hemorragia nasal. ?Sensaci?n de malestar en el est?mago (n?useas). ?V?mitos. ?Cambios en la forma de ver. ?Dolor muy intenso en el pecho. ?Convulsiones. ??C?mo se trata? ?Esta afecci?n se trata haciendo cambios saludables en el estilo de vida, por ejemplo: ?Consumir alimentos  saludables. ?Hacer m?s ejercicio. ?Beber menos alcohol. ?El m?dico puede recetarle medicamentos si los cambios en el estilo de vida no son suficientes para lograr controlar la presi?n arterial y si: ?El n?mero de arriba est? por encima de 130. ?El n?mero de abajo est? por encima de 80. ?Su presi?n arterial personal ideal puede variar. ?Siga estas instrucciones en su casa: ?Comida y bebida ? ?Si se lo dicen, siga el plan de alimentaci?n de DASH (Dietary Approaches to Stop Hypertension, Maneras de alimentarse para detener la hipertensi?n). Para seguir este plan: ?Llene la mitad del plato de cada comida con frutas y verduras. ?Llene un cuarto del plato de cada comida con cereales integrales. Los cereales integrales incluyen pasta integral, arroz integral y pan integral. ?Coma y beba productos l?cteos con bajo contenido de grasa, como leche descremada o yogur bajo en grasas. ?Llene un cuarto del plato de cada comida con prote?nas bajas en grasa (magras). Las prote?nas bajas en grasa incluyen pescado, pollo sin piel, huevos, frijoles y tofu. ?Evite consumir carne grasa, carne curada y procesada, o pollo con piel. ?Evite consumir alimentos prehechos o procesados. ?Consuma menos de 1500 mg de sal por d?a. ?No beba alcohol si: ?El m?dico le indica que no lo haga. ?Est? embarazada, puede estar embarazada o est? tratando de quedar embarazada. ?Si bebe alcohol: ?Limite la cantidad que bebe a lo siguiente: ?De 0 a 1 medida por d?a para las mujeres. ?De 0 a 2 medidas por d?a para los hombres. ?Est? atento a la cantidad de alcohol que hay en las bebidas que toma. En los [place], una medida equivale a una botella de cerveza de 12 oz (355 ml), un vaso de vino de 5 oz (148 ml) o un vaso de una bebida alcoh?lica de   alta graduaci?n de 1? oz (44 ml). ?Estilo de vida ? ?Trabaje con su m?dico para mantenerse en un peso saludable o para perder peso. Preg?ntele a su m?dico cu?l es el peso recomendable para usted. ?Haga al menos 30  minutos de ejercicio la mayor?a de los d?as de la semana. Estos pueden incluir caminar, nadar o andar en bicicleta. ?Realice al menos 30 minutos de ejercicio que fortalezca sus m?sculos (ejercicios de resistencia) al menos 3 d?as a la semana. Estos pueden incluir levantar pesas o hacer Pilates. ?No consuma ning?n producto que contenga nicotina o tabaco, como cigarrillos, cigarrillos electr?nicos y tabaco de mascar. Si necesita ayuda para dejar de fumar, consulte al m?dico. ?Controle su presi?n arterial en su casa tal como le indic? el m?dico. ?Concurra a todas las visitas de seguimiento como se lo haya indicado el m?dico. Esto es importante. ?Medicamentos ?Tome los medicamentos de venta libre y los recetados solamente como se lo haya indicado el m?dico. Siga cuidadosamente las indicaciones. ?No omita las dosis de medicamentos para la presi?n arterial. Los medicamentos pierden eficacia si omite dosis. El hecho de omitir las dosis tambi?n aumenta el riesgo de otros problemas. ?Preg?ntele a su m?dico a qu? efectos secundarios o reacciones a los medicamentos debe prestar atenci?n. ?Comun?quese con un m?dico si: ?Piensa que tiene una reacci?n a los medicamentos que est? tomando. ?Tiene dolores de cabeza frecuentes (recurrentes). ?Se siente mareado. ?Tiene hinchaz?n en los tobillos. ?Tiene problemas de visi?n. ?Solicite ayuda inmediatamente si: ?Siente un dolor de cabeza muy intenso. ?Empieza a sentirse desorientado (confundido). ?Se siente d?bil o adormecido. ?Siente que va a desmayarse. ?Tiene un dolor muy intenso en las siguientes zonas: ?Pecho. ?Vientre (abdomen). ?Vomita m?s de una vez. ?Tiene dificultad para respirar. ?Resumen ?El t?rmino hipertensi?n es otra forma de denominar a la presi?n arterial elevada. ?La presi?n arterial elevada fuerza al coraz?n a trabajar m?s para bombear la sangre. ?Para la mayor?a de las personas, una presi?n arterial normal es menor que 120/80. ?Las decisiones saludables pueden ayudarle  a disminuir su presi?n arterial. Si no puede bajar su presi?n arterial mediante decisiones saludables, es posible que deba tomar medicamentos. ?Esta informaci?n no tiene como fin reemplazar el consejo del m?dico. Aseg?rese de hacerle al m?dico cualquier pregunta que tenga. ?Document Revised: 10/03/2017 Document Reviewed: 10/03/2017 ?Elsevier Patient Education ? 2022 Elsevier Inc. ? ?

## 2020-09-13 NOTE — Assessment & Plan Note (Signed)
Stable.  No chest pain or recent anginal symptoms.  No need for sublingual nitroglycerin use.

## 2020-09-13 NOTE — Assessment & Plan Note (Signed)
Well-controlled hypertension with normal blood pressure readings at home.  Continue Lipitor 5 mg daily.  Dietary approaches to stop hypertension discussed.

## 2020-09-13 NOTE — Assessment & Plan Note (Signed)
Diet and nutrition discussed.  Advised to reduce daily carbohydrate intake.

## 2020-10-11 ENCOUNTER — Other Ambulatory Visit: Payer: Self-pay

## 2020-10-11 ENCOUNTER — Encounter: Payer: Self-pay | Admitting: Internal Medicine

## 2020-10-11 ENCOUNTER — Ambulatory Visit (INDEPENDENT_AMBULATORY_CARE_PROVIDER_SITE_OTHER): Payer: 59 | Admitting: Internal Medicine

## 2020-10-11 VITALS — BP 130/70 | HR 75 | Ht 63.0 in | Wt 147.6 lb

## 2020-10-11 DIAGNOSIS — I10 Essential (primary) hypertension: Secondary | ICD-10-CM

## 2020-10-11 DIAGNOSIS — R109 Unspecified abdominal pain: Secondary | ICD-10-CM | POA: Diagnosis not present

## 2020-10-11 DIAGNOSIS — R7303 Prediabetes: Secondary | ICD-10-CM

## 2020-10-11 MED ORDER — POLYETHYLENE GLYCOL 3350 17 GM/SCOOP PO POWD: 17.0000 g | Freq: Two times a day (BID) | ORAL | 1 refills | Status: DC | PRN

## 2020-10-11 NOTE — Progress Notes (Signed)
Patient ID: Ethan Silva, male   DOB: 12/01/1965, 55 y.o.   MRN: [id number]

## 2020-10-11 NOTE — Patient Instructions (Addendum)
Please take all new medication as prescribed - the miralax twice per day  Please continue all other medications as before, and refills have been done if requested.  Please have the pharmacy call with any other refills you may need.  Please continue your efforts at being more active, low cholesterol diet, and weight control  Please keep your appointments with your specialists as you may have planned  Please go to the LAB at the blood drawing area for the tests to be done  You will be contacted by phone if any changes need to be made immediately.  Otherwise, you will receive a letter about your results with an explanation, but please check with MyChart first.  Please remember to sign up for MyChart if you have not done so, as this will be important to you in the future with finding out test results, communicating by private email, and scheduling acute appointments online when needed.

## 2020-10-11 NOTE — Progress Notes (Signed)
Chief Complaint: follow up abd pain       HPI:  Ethan Silva is a 55 y.o. male difficult historian due to language barrier; here with c/o 3-4 days onset worsening abd pain, mostly left sided and mid abdomen, with nausea, and vomiting x 3 (but none today and taking po ok today).  No fever, chills, or urinary symtpoms.  Has ongoing constipation and states pain discomfort is better after BM.  Not really distended.  No radiation.  Had similar episode late august with elevated LFTs (improved on f/u lab testing) and large stool burden on CT.  Pt denies chest pain, increased sob or doe, wheezing, orthopnea, PND, increased LE swelling, palpitations, dizziness or syncope.         Wt Readings from Last 3 Encounters:  10/11/20 147 lb 9.6 oz (67 kg)  09/13/20 151 lb (68.5 kg)  08/30/20 178 lb 9.2 oz (81 kg)   BP Readings from Last 3 Encounters:  10/11/20 130/70  09/13/20 130/70  08/30/20 (!) 150/90         Past Medical History:  Diagnosis Date   Aortic stenosis, mild 07/2012   Very mild aortic stenosis noted on echo-mean gradient 11 mmHg.   Bile duct leak    CAD in native artery 07/14/2020   Small branch of OM 3 with 80% ostial stenosis.  Too small for PCI.  Otherwise questionable 30% LM and 30% PDA.   Chronic kidney disease 2017   left kidney stones   Headache(784.0)    Hx: of when BP is elevated   Hyperlipidemia    Hypertension    Non-STEMI (non-ST elevated myocardial infarction) (HCC) 07/13/2020   Admitted with hypertension and chest pain.  Cardiac cath showed small branch of an OM3 with 85% ostial stenosis not amenable to PCI.  No other significant disease noted.  Normal EF on echo.  Medical management.   Numbness and tingling in hands    Hx: of   Prediabetes    Past Surgical History:  Procedure Laterality Date   CHOLECYSTECTOMY N/A 07/11/2012   Procedure: LAPAROSCOPIC CHOLECYSTECTOMY WITH INTRAOPERATIVE CHOLANGIOGRAM;  Surgeon: Axel Filler, MD;  Location: [facility]  OR;  Service: General;  Laterality: N/A;   COLONOSCOPY     ERCP  07/16/2012   ERCP N/A 07/16/2012   Procedure: ENDOSCOPIC RETROGRADE CHOLANGIOPANCREATOGRAPHY (ERCP);  Surgeon: Louis Meckel, MD;  Location: [facility] OR;  Service: Gastroenterology;  Laterality: N/A;   ERCP N/A 10/06/2012   Procedure: ENDOSCOPIC RETROGRADE CHOLANGIOPANCREATOGRAPHY (ERCP);  Surgeon: Louis Meckel, MD;  Location: [facility];  Service: Endoscopy;  Laterality: N/A;   HAND SURGERY     LEFT HEART CATH AND CORONARY ANGIOGRAPHY N/A 07/14/2020   Procedure: LEFT HEART CATH AND CORONARY ANGIOGRAPHY;  Surgeon: Lyn Records, MD;  Location: [facility] INVASIVE CV LAB;  Service: Cardiovascular; ? Culprit Lesion ~ 80% ostial small OM3 (too small & ostial for PCI). ~30% mid LM (at a bend).  LAD with D1 & D2 - normal, RCA minimal luminal irregularities ~ 30%. PDA   TRANSTHORACIC ECHOCARDIOGRAM  07/14/2020   (NSTEMI/Accelerated Hypertension): EF 55 to 60%.  No RWM A.  Very mild Aortic Stenosis-mean gradient 11 to mmHg.    reports that he has never smoked. He has never used smokeless tobacco. He reports that he does not drink alcohol and does not use drugs. family history includes Diabetes in his paternal uncle; Gallstones in his brother and mother; Hypertension in his mother. No Known Allergies  Current Outpatient Medications on File Prior to Visit  Medication Sig Dispense Refill   amLODipine (NORVASC) 5 MG tablet Take 1 tablet (5 mg total) by mouth daily. 30 tablet 11   aspirin 81 MG EC tablet Take 1 tablet (81 mg total) by mouth daily. Swallow whole. 90 tablet 3   atorvastatin (LIPITOR) 80 MG tablet Take 1 tablet (80 mg total) by mouth daily. 90 tablet 3   lisinopril-hydrochlorothiazide (ZESTORETIC) 20-12.5 MG tablet Take 1 tablet by mouth daily. 90 tablet 3   nitroGLYCERIN (NITROSTAT) 0.4 MG SL tablet Place 1 tablet (0.4 mg total) under the tongue every 5 (five) minutes x 3 doses as needed for chest pain. 25 tablet 1   ondansetron  (ZOFRAN ODT) 4 MG disintegrating tablet Take 1 tablet (4 mg total) by mouth every 8 (eight) hours as needed for nausea or vomiting. 10 tablet 0   pantoprazole (PROTONIX) 40 MG tablet Take 1 tablet (40 mg total) by mouth daily. 30 tablet 11   No current facility-administered medications on file prior to visit.        ROS:  All others reviewed and negative.  Objective        PE:  BP 130/70 (BP Location: Right Arm, Patient Position: Sitting, Cuff Size: Normal)   Pulse 75   Ht 5\' 3"  (1.6 m)   Wt 147 lb 9.6 oz (67 kg)   SpO2 98%   BMI 26.15 kg/m                 Constitutional: Pt appears in NAD, non toxic               HENT: Head: NCAT.                Right Ear: External ear normal.                 Left Ear: External ear normal.                Eyes: . Pupils are equal, round, and reactive to light. Conjunctivae and EOM are normal               Nose: without d/c or deformity               Neck: Neck supple. Gross normal ROM               Cardiovascular: Normal rate and regular rhythm.                 Pulmonary/Chest: Effort normal and breath sounds without rales or wheezing.                Abd:  Soft, mild tender primarily LUQ, somewhat LLQ as wel, ND, + BS, no organomegaly, no guarding or rebound               Neurological: Pt is alert. At baseline orientation, motor grossly intact               Skin: Skin is warm. No rashes, no other new lesions, LE edema - none               Psychiatric: Pt behavior is normal without agitation   Micro: none  Cardiac tracings I have personally interpreted today:  none  Pertinent Radiological findings (summarize): none   Lab Results  Component Value Date   WBC 6.9 10/12/2020   HGB 15.6 10/12/2020   HCT 46.6 10/12/2020   PLT 236.0 10/12/2020  GLUCOSE 107 (H) 10/12/2020   CHOL 162 07/14/2020   TRIG 33 07/14/2020   HDL 40 (L) 07/14/2020   LDLCALC 115 (H) 07/14/2020   ALT 463 (H) 10/12/2020   AST 397 (H) 10/12/2020   NA 140 10/12/2020   K  4.1 10/12/2020   CL 103 10/12/2020   CREATININE 0.88 10/12/2020   BUN 20 10/12/2020   CO2 29 10/12/2020   TSH 1.970 09/20/2017   PSA 0.8 08/30/2015   INR 1.1 08/27/2020   HGBA1C 5.9 (H) 07/14/2020   Assessment/Plan:  Ethan Silva is a 55 y.o. Other or two or more races [6] male with  has a past medical history of Aortic stenosis, mild (07/2012), Bile duct leak, CAD in native artery (07/14/2020), Chronic kidney disease (2017), Headache(784.0), Hyperlipidemia, Hypertension, Non-STEMI (non-ST elevated myocardial infarction) (HCC) (07/13/2020), Numbness and tingling in hands, and Prediabetes.  Abdominal pain Etiology unclear, no vomting today, is mild tender on exam but o/w nontoxic appearing; for labs as ordered including repeat cbc, lfts and lipase, and will hold on repeat imaging for now;  Ok for miralax daily for constipation and further recommendation will be pending lab results.   Prediabetes Lab Results  Component Value Date   HGBA1C 5.9 (H) 07/14/2020   Stable, pt to continue current medical treatment  - diet   Essential hypertension BP Readings from Last 3 Encounters:  10/11/20 130/70  09/13/20 130/70  08/30/20 (!) 150/90   Stable, pt to continue medical treatment norvasc, zestoretic  Followup: Return if symptoms worsen or fail to improve.  Oliver Barre, MD 10/12/2020 2:26 PM [facility] Internal Medicine

## 2020-10-12 ENCOUNTER — Emergency Department (HOSPITAL_BASED_OUTPATIENT_CLINIC_OR_DEPARTMENT_OTHER): Payer: 59

## 2020-10-12 ENCOUNTER — Emergency Department (HOSPITAL_BASED_OUTPATIENT_CLINIC_OR_DEPARTMENT_OTHER)
Admission: EM | Admit: 2020-10-12 | Discharge: 2020-10-12 | Disposition: A | Discharge status: Home / Self Care | Payer: 59 | Attending: Emergency Medicine | Admitting: Emergency Medicine

## 2020-10-12 ENCOUNTER — Other Ambulatory Visit: Payer: Self-pay

## 2020-10-12 ENCOUNTER — Other Ambulatory Visit (INDEPENDENT_AMBULATORY_CARE_PROVIDER_SITE_OTHER): Payer: 59

## 2020-10-12 ENCOUNTER — Encounter (HOSPITAL_BASED_OUTPATIENT_CLINIC_OR_DEPARTMENT_OTHER): Payer: Self-pay | Admitting: Obstetrics and Gynecology

## 2020-10-12 ENCOUNTER — Telehealth: Payer: Self-pay | Admitting: Cardiology

## 2020-10-12 ENCOUNTER — Telehealth: Payer: Self-pay | Admitting: Emergency Medicine

## 2020-10-12 ENCOUNTER — Encounter: Payer: Self-pay | Admitting: Internal Medicine

## 2020-10-12 DIAGNOSIS — Z7982 Long term (current) use of aspirin: Secondary | ICD-10-CM | POA: Insufficient documentation

## 2020-10-12 DIAGNOSIS — I129 Hypertensive chronic kidney disease with stage 1 through stage 4 chronic kidney disease, or unspecified chronic kidney disease: Secondary | ICD-10-CM | POA: Diagnosis not present

## 2020-10-12 DIAGNOSIS — R110 Nausea: Secondary | ICD-10-CM | POA: Diagnosis not present

## 2020-10-12 DIAGNOSIS — N189 Chronic kidney disease, unspecified: Secondary | ICD-10-CM | POA: Diagnosis not present

## 2020-10-12 DIAGNOSIS — R748 Abnormal levels of other serum enzymes: Secondary | ICD-10-CM

## 2020-10-12 DIAGNOSIS — Z955 Presence of coronary angioplasty implant and graft: Secondary | ICD-10-CM | POA: Diagnosis not present

## 2020-10-12 DIAGNOSIS — I25119 Atherosclerotic heart disease of native coronary artery with unspecified angina pectoris: Secondary | ICD-10-CM | POA: Insufficient documentation

## 2020-10-12 DIAGNOSIS — Z79899 Other long term (current) drug therapy: Secondary | ICD-10-CM | POA: Insufficient documentation

## 2020-10-12 DIAGNOSIS — R7401 Elevation of levels of liver transaminase levels: Secondary | ICD-10-CM | POA: Diagnosis not present

## 2020-10-12 DIAGNOSIS — R1013 Epigastric pain: Secondary | ICD-10-CM

## 2020-10-12 DIAGNOSIS — Z7902 Long term (current) use of antithrombotics/antiplatelets: Secondary | ICD-10-CM | POA: Diagnosis not present

## 2020-10-12 DIAGNOSIS — R109 Unspecified abdominal pain: Secondary | ICD-10-CM | POA: Diagnosis not present

## 2020-10-12 LAB — CBC WITH DIFFERENTIAL/PLATELET
Basophils Absolute: 0 10*3/uL (ref 0.0–0.1)
Basophils Relative: 0.5 % (ref 0.0–3.0)
Eosinophils Absolute: 0 10*3/uL (ref 0.0–0.7)
Eosinophils Relative: 0.4 % (ref 0.0–5.0)
HCT: 46.6 % (ref 39.0–52.0)
Hemoglobin: 15.6 g/dL (ref 13.0–17.0)
Lymphocytes Relative: 15.7 % (ref 12.0–46.0)
Lymphs Abs: 1.1 10*3/uL (ref 0.7–4.0)
MCHC: 33.5 g/dL (ref 30.0–36.0)
MCV: 89.5 fl (ref 78.0–100.0)
Monocytes Absolute: 0.7 10*3/uL (ref 0.1–1.0)
Monocytes Relative: 10.1 % (ref 3.0–12.0)
Neutro Abs: 5 10*3/uL (ref 1.4–7.7)
Neutrophils Relative %: 73.3 % (ref 43.0–77.0)
Platelets: 236 10*3/uL (ref 150.0–400.0)
RBC: 5.21 Mil/uL (ref 4.22–5.81)
RDW: 14.4 % (ref 11.5–15.5)
WBC: 6.9 10*3/uL (ref 4.0–10.5)

## 2020-10-12 LAB — URINALYSIS, ROUTINE W REFLEX MICROSCOPIC
Ketones, ur: NEGATIVE
Leukocytes,Ua: NEGATIVE
Nitrite: NEGATIVE
Specific Gravity, Urine: 1.015 (ref 1.000–1.030)
Total Protein, Urine: NEGATIVE
Urine Glucose: NEGATIVE
Urobilinogen, UA: >=8 — AB (ref 0.0–1.0)
pH: 7.5 (ref 5.0–8.0)

## 2020-10-12 LAB — PROTIME-INR
INR: 0.9 (ref 0.8–1.2)
Prothrombin Time: 12.3 seconds (ref 11.4–15.2)

## 2020-10-12 LAB — LIPASE: Lipase: 79 U/L — ABNORMAL HIGH (ref 11.0–59.0)

## 2020-10-12 LAB — BASIC METABOLIC PANEL
BUN: 20 mg/dL (ref 6–23)
CO2: 29 mEq/L (ref 19–32)
Calcium: 9.7 mg/dL (ref 8.4–10.5)
Chloride: 103 mEq/L (ref 96–112)
Creatinine, Ser: 0.88 mg/dL (ref 0.40–1.50)
GFR: 95.41 mL/min (ref >60.00)
Glucose, Bld: 107 mg/dL — ABNORMAL HIGH (ref 70–99)
Potassium: 4.1 mEq/L (ref 3.5–5.1)
Sodium: 140 mEq/L (ref 135–145)

## 2020-10-12 LAB — HEPATIC FUNCTION PANEL
ALT: 463 U/L — ABNORMAL HIGH (ref 0–53)
AST: 397 U/L — ABNORMAL HIGH (ref 0–37)
Albumin: 4.2 g/dL (ref 3.5–5.2)
Alkaline Phosphatase: 354 U/L — ABNORMAL HIGH (ref 39–117)
Bilirubin, Direct: 1.3 mg/dL — ABNORMAL HIGH (ref 0.0–0.3)
Total Bilirubin: 2.1 mg/dL — ABNORMAL HIGH (ref 0.2–1.2)
Total Protein: 7.6 g/dL (ref 6.0–8.3)

## 2020-10-12 MED ORDER — ONDANSETRON HCL 4 MG PO TABS: 4.0000 mg | ORAL_TABLET | Freq: Four times a day (QID) | ORAL | 0 refills | Status: DC

## 2020-10-12 MED ORDER — SODIUM CHLORIDE 0.9 % IV BOLUS
1000.0000 mL | Freq: Once | INTRAVENOUS | Status: AC
  Administered 2020-10-12: 1000 mL via INTRAVENOUS

## 2020-10-12 MED ORDER — CLOPIDOGREL BISULFATE 75 MG PO TABS: 75.0000 mg | ORAL_TABLET | Freq: Every day | ORAL | 3 refills | Status: DC

## 2020-10-12 MED ORDER — ONDANSETRON HCL 4 MG/2ML IJ SOLN
4.0000 mg | Freq: Once | INTRAMUSCULAR | Status: AC
  Administered 2020-10-12: 4 mg via INTRAVENOUS
  Filled 2020-10-12: qty 2

## 2020-10-12 MED ORDER — IOHEXOL 300 MG/ML  SOLN
100.0000 mL | Freq: Once | INTRAMUSCULAR | Status: AC | PRN
  Administered 2020-10-12: 100 mL via INTRAVENOUS

## 2020-10-12 NOTE — Assessment & Plan Note (Signed)
Lab Results  Component Value Date   HGBA1C 5.9 (H) 07/14/2020   Stable, pt to continue current medical treatment  - diet

## 2020-10-12 NOTE — ED Notes (Signed)
Patient transported to CT 

## 2020-10-12 NOTE — Assessment & Plan Note (Signed)
BP Readings from Last 3 Encounters:  10/11/20 130/70  09/13/20 130/70  08/30/20 (!) 150/90   Stable, pt to continue medical treatment norvasc, zestoretic

## 2020-10-12 NOTE — ED Triage Notes (Signed)
Patient reports to the ER, sent by PCP due to elevated liver enzymes. Patient was sent to "have a scan" to check his liver.

## 2020-10-12 NOTE — Telephone Encounter (Signed)
1.Medication Requested: clopidogrel (PLAVIX) 75 MG tablet   2. Pharmacy (Name, Street, [place]):  [id number] - [place] ([place]), [place] - [address] Phone:  [phone]  Fax:  [phone]      3. On Med List:  Y  4. Last Visit with PCP: 9.13.22  5. Next visit date with PCP: not scheduled   Agent: Please be advised that RX refills may take up to 3 business days. We ask that you follow-up with your pharmacy.

## 2020-10-12 NOTE — ED Notes (Signed)
ED Provider at bedside. 

## 2020-10-12 NOTE — ED Provider Notes (Addendum)
[facility] EMERGENCY DEPT Provider Note   CSN: [id number] Arrival date & time: 10/12/20  1541     History Chief Complaint  Patient presents with   Abdominal Pain   Abnormal Lab    Ethan Silva is a 55 y.o. male.  The history is provided by the patient.  Abdominal Pain Pain location:  Epigastric Pain quality: aching   Pain radiates to:  Does not radiate Pain severity:  Mild Onset quality:  Gradual Timing:  Intermittent Progression:  Waxing and waning Chronicity:  New Context: not previous surgeries   Relieved by:  Nothing Worsened by:  Nothing Ineffective treatments:  None tried Associated symptoms: no anorexia, no belching, no chest pain, no chills, no cough, no dysuria, no fever, no hematuria, no shortness of breath, no sore throat and no vomiting   Risk factors: multiple surgeries   Abnormal Lab     Past Medical History:  Diagnosis Date   Aortic stenosis, mild 07/2012   Very mild aortic stenosis noted on echo-mean gradient 11 mmHg.   Bile duct leak    CAD in native artery 07/14/2020   Small branch of OM 3 with 80% ostial stenosis.  Too small for PCI.  Otherwise questionable 30% LM and 30% PDA.   Chronic kidney disease 2017   left kidney stones   Headache(784.0)    Hx: of when BP is elevated   Hyperlipidemia    Hypertension    Non-STEMI (non-ST elevated myocardial infarction) (HCC) 07/13/2020   Admitted with hypertension and chest pain.  Cardiac cath showed small branch of an OM3 with 85% ostial stenosis not amenable to PCI.  No other significant disease noted.  Normal EF on echo.  Medical management.   Numbness and tingling in hands    Hx: of   Prediabetes     Patient Active Problem List   Diagnosis Date Noted   Coronary artery disease involving native coronary artery of native heart with angina pectoris (HCC) 07/15/2020   Prediabetes 07/15/2020   NSTEMI (non-ST elevated myocardial infarction) (HCC) 07/14/2020   BMI  30.0-30.9,adult 08/30/2015   Hyperlipidemia with target LDL less than 70 08/30/2015   Abdominal pain 11/21/2012   Essential hypertension 10/11/2012    Past Surgical History:  Procedure Laterality Date   CHOLECYSTECTOMY N/A 07/11/2012   Procedure: LAPAROSCOPIC CHOLECYSTECTOMY WITH INTRAOPERATIVE CHOLANGIOGRAM;  Surgeon: Axel Filler, MD;  Location: [facility] OR;  Service: General;  Laterality: N/A;   COLONOSCOPY     ERCP  07/16/2012   ERCP N/A 07/16/2012   Procedure: ENDOSCOPIC RETROGRADE CHOLANGIOPANCREATOGRAPHY (ERCP);  Surgeon: Louis Meckel, MD;  Location: [facility] OR;  Service: Gastroenterology;  Laterality: N/A;   ERCP N/A 10/06/2012   Procedure: ENDOSCOPIC RETROGRADE CHOLANGIOPANCREATOGRAPHY (ERCP);  Surgeon: Louis Meckel, MD;  Location: [facility];  Service: Endoscopy;  Laterality: N/A;   HAND SURGERY     LEFT HEART CATH AND CORONARY ANGIOGRAPHY N/A 07/14/2020   Procedure: LEFT HEART CATH AND CORONARY ANGIOGRAPHY;  Surgeon: Lyn Records, MD;  Location: [facility] INVASIVE CV LAB;  Service: Cardiovascular; ? Culprit Lesion ~ 80% ostial small OM3 (too small & ostial for PCI). ~30% mid LM (at a bend).  LAD with D1 & D2 - normal, RCA minimal luminal irregularities ~ 30%. PDA   TRANSTHORACIC ECHOCARDIOGRAM  07/14/2020   (NSTEMI/Accelerated Hypertension): EF 55 to 60%.  No RWM A.  Very mild Aortic Stenosis-mean gradient 11 to mmHg.       Family History  Problem Relation Age of Onset  Gallstones Mother    Hypertension Mother    Gallstones Brother    Diabetes Paternal Uncle     Social History   Tobacco Use   Smoking status: Never    Passive exposure: Never   Smokeless tobacco: Never  Vaping Use   Vaping Use: Never used  Substance Use Topics   Alcohol use: No   Drug use: No    Home Medications Prior to Admission medications   Medication Sig Start Date End Date Taking? Authorizing Provider  ondansetron (ZOFRAN) 4 MG tablet Take 1 tablet (4 mg total) by mouth every 6 (six) hours.  10/12/20  Yes Micheala Morissette, DO  amLODipine (NORVASC) 5 MG tablet Take 1 tablet (5 mg total) by mouth daily. 07/15/20   O'NealRonnald Ramp, MD  aspirin 81 MG EC tablet Take 1 tablet (81 mg total) by mouth daily. Swallow whole. 07/15/20   O'NealRonnald Ramp, MD  atorvastatin (LIPITOR) 80 MG tablet Take 1 tablet (80 mg total) by mouth daily. 07/15/20   O'NealRonnald Ramp, MD  clopidogrel (PLAVIX) 75 MG tablet Take 1 tablet (75 mg total) by mouth daily. 10/12/20   Georgina Quint, MD  lisinopril-hydrochlorothiazide (ZESTORETIC) 20-12.5 MG tablet Take 1 tablet by mouth daily. 05/31/20   Georgina Quint, MD  nitroGLYCERIN (NITROSTAT) 0.4 MG SL tablet Place 1 tablet (0.4 mg total) under the tongue every 5 (five) minutes x 3 doses as needed for chest pain. 07/15/20   Sande Rives, MD  ondansetron (ZOFRAN ODT) 4 MG disintegrating tablet Take 1 tablet (4 mg total) by mouth every 8 (eight) hours as needed for nausea or vomiting. 08/27/20   Pricilla Loveless, MD  pantoprazole (PROTONIX) 40 MG tablet Take 1 tablet (40 mg total) by mouth daily. 07/16/20   O'Neal, Ronnald Ramp, MD  polyethylene glycol powder (GLYCOLAX/MIRALAX) 17 GM/SCOOP powder Take 17 g by mouth 2 (two) times daily as needed. 10/11/20   Corwin Levins, MD    Allergies    Patient has no known allergies.  Review of Systems   Review of Systems  Constitutional:  Negative for chills and fever.  HENT:  Negative for ear pain and sore throat.   Eyes:  Negative for pain and visual disturbance.  Respiratory:  Negative for cough and shortness of breath.   Cardiovascular:  Negative for chest pain and palpitations.  Gastrointestinal:  Positive for abdominal pain. Negative for anorexia and vomiting.  Genitourinary:  Negative for dysuria and hematuria.  Musculoskeletal:  Negative for arthralgias and back pain.  Skin:  Negative for color change and rash.  Neurological:  Negative for seizures and syncope.  All other systems  reviewed and are negative.  Physical Exam Updated Vital Signs BP (!) 145/98 (BP Location: Right Arm)   Pulse 60   Temp 98.7 F (37.1 C)   Resp 16   Ht 5\' 3"  (1.6 m)   Wt 66.7 kg   SpO2 98%   BMI 26.04 kg/m   Physical Exam Vitals and nursing note reviewed.  Constitutional:      General: He is not in acute distress.    Appearance: He is well-developed. He is not ill-appearing.  HENT:     Head: Normocephalic and atraumatic.  Eyes:     Extraocular Movements: Extraocular movements intact.     Conjunctiva/sclera: Conjunctivae normal.     Pupils: Pupils are equal, round, and reactive to light.  Cardiovascular:     Rate and Rhythm: Normal rate and regular rhythm.  Heart sounds: Normal heart sounds. No murmur heard. Pulmonary:     Effort: Pulmonary effort is normal. No respiratory distress.     Breath sounds: Normal breath sounds.  Abdominal:     General: Abdomen is flat.     Palpations: Abdomen is soft.     Tenderness: There is abdominal tenderness in the epigastric area. There is no guarding or rebound.  Genitourinary:    Rectum: Normal.  Musculoskeletal:     Cervical back: Neck supple.  Skin:    General: Skin is warm and dry.     Capillary Refill: Capillary refill takes less than 2 seconds.  Neurological:     General: No focal deficit present.     Mental Status: He is alert.  Psychiatric:        Mood and Affect: Mood normal.    ED Results / Procedures / Treatments   Labs (all labs ordered are listed, but only abnormal results are displayed) Labs Reviewed  PROTIME-INR    EKG None  Radiology CT ABDOMEN PELVIS W CONTRAST  Result Date: 10/12/2020 CLINICAL DATA:  Abdominal pain.  Elevated liver enzymes. EXAM: CT ABDOMEN AND PELVIS WITH CONTRAST TECHNIQUE: Multidetector CT imaging of the abdomen and pelvis was performed using the standard protocol following bolus administration of intravenous contrast. CONTRAST:  OMNIPAQUE IOHEXOL 300 MG/ML  SOLN  COMPARISON:  CT abdomen pelvis 08/30/2020 FINDINGS: Lower chest: No acute abnormality. Hepatobiliary: No focal liver abnormality. Status post cholecystectomy. Pneumobilia. No biliary dilatation. Pancreas: No focal lesion. Normal pancreatic contour. No surrounding inflammatory changes. No main pancreatic ductal dilatation. Spleen: Normal in size without focal abnormality. Adrenals/Urinary Tract: No adrenal nodule bilaterally. Bilateral kidneys enhance symmetrically. There is a simple renal cyst measuring up to 2.4 cm on the left. There is another fluid density lesion measuring up to 1.7 cm in the left kidney within associated thin septation noted on coronal view. Subcentimeter hypodensities are too small to characterize. No hydronephrosis. No hydroureter. The urinary bladder is unremarkable. Stomach/Bowel: Stomach is within normal limits. No evidence of bowel wall thickening or dilatation. Appendix appears normal. Vascular/Lymphatic: No abdominal aorta or iliac aneurysm. Mild atherosclerotic plaque of the aorta and its branches. No abdominal, pelvic, or inguinal lymphadenopathy. Reproductive: The prostate is enlarged measuring up to 5 cm. Partially visualized right hydrocele. Other: No intraperitoneal free fluid. No intraperitoneal free gas. No organized fluid collection. Musculoskeletal: No intraperitoneal free fluid. No intraperitoneal free gas. No organized fluid collection. IMPRESSION: 1. No acute intra-abdominal or intrapelvic abnormality in a patient status post cholecystectomy with associated pneumobilia. 2. Prostatomegaly. 3. Minimally complex 1.7 cm left renal cyst. 4. Right hydrocele. Electronically Signed   By: Tish Frederickson M.D.   On: 10/12/2020 17:44    Procedures Procedures   Medications Ordered in ED Medications  sodium chloride 0.9 % bolus 1,000 mL (1,000 mLs Intravenous New Bag/Given 10/12/20 1634)  ondansetron (ZOFRAN) injection 4 mg (4 mg Intravenous Given 10/12/20 1635)  iohexol  (OMNIPAQUE) 300 MG/ML solution 100 mL (100 mLs Intravenous Contrast Given 10/12/20 1649)    ED Course  I have reviewed the triage vital signs and the nursing notes.  Pertinent labs & imaging results that were available during my care of the patient were reviewed by me and considered in my medical decision making (see chart for details).    MDM Rules/Calculators/A&P                           Franne Grip  Blanton is here for CT scan.  Sent from primary care doctor's office because of mild elevation in lipase and liver function.  Has had gallbladder removed in the past.  Recently was diagnosed with a transverse colitis which is similar to his pain today.  He had a mild transaminase at that time which improved.  He had negative hepatitis panel.  Normal INR.  Liver enzymes also mildly elevated today but not as high as a month ago.  He has had a normal colonoscopy states about 7 years ago.  He denies any history of inflammatory bowel disease or irritable bowel disease.  He has had some nausea but no vomiting.  Pain seems to be mostly in the epigastric region.  Lipase was 79.  Liver enzymes in the low 100s.  We will check an INR and get a CT scan abdomen and pelvis.  Suspicion is for colitis again or possibly reflux type symptoms.  He is not having any chest pain or shortness of breath.  INR is normal.  And shows overall no acute process.  Renal cysts, seen prior.  Otherwise no major stool burden.  Mild liver enzymes are elevated INR is normal.  He has had some elevation in liver enzymes on and off recently.  We will have him follow-up with his gastroenterologist for further work-up.  Recommend that he follow-up with his primary care doctor for repeat lab work if he is unable to follow-up with his GI doctor.  Understands return precautions.  Discharged in good condition.  This chart was dictated using voice recognition software.  Despite best efforts to proofread,  errors can occur which can change  the documentation meaning.   Final Clinical Impression(s) / ED Diagnoses Final diagnoses:  Epigastric pain  Elevated liver enzymes    Rx / DC Orders ED Discharge Orders          Ordered    ondansetron (ZOFRAN) 4 MG tablet  Every 6 hours        10/12/20 1753             Virgina Norfolk, DO 10/12/20 1755    Cheryll Keisler, DO 10/12/20 1801

## 2020-10-12 NOTE — Telephone Encounter (Signed)
   [facility] Pre-operative Risk Assessment    Patient Name: Ethan Silva  DOB: 1965/06/15 MRN: [id number]  HEARTCARE STAFF:  - IMPORTANT!!!!!! Under Visit Info/Reason for Call, type in Other and utilize the format Clearance MM/DD/YY or Clearance TBD. Do not use dashes or single digits. - Please review there is not already an duplicate clearance open for this procedure. - If request is for dental extraction, please clarify the # of teeth to be extracted. - If the patient is currently at the dentist's office, call Pre-Op Callback Staff (MA/nurse) to input urgent request.  - If the patient is not currently in the dentist office, please route to the Pre-Op pool.  Request for surgical clearance:  What type of surgery is being performed? Deep Cleaning, Dental extraction, crown, and fillings , all to occur at different appointments   When is this surgery scheduled? TBD pending clearance   What type of clearance is required (medical clearance vs. Pharmacy clearance to hold med vs. Both)? Both  Are there any medications that need to be held prior to surgery and how long? TBD per Cardiology  Practice name and name of physician performing surgery? Dr. Santa Lighter, [place]  What is the office phone number?    7.   What is the office fax number?  Office does not handle faxes. Please e-mail clearance to: [email or web address]    8.   Anesthesia type (None, local, MAC, general) ? Local   Johnna Acosta 10/12/2020, 3:05 PM  _________________________________________________________________   (provider comments below)

## 2020-10-12 NOTE — Discharge Instructions (Addendum)
Follow-up with gastroenterologist.  Take Zofran as needed for nausea and vomiting.  Please return if you develop worsening pain, fever.

## 2020-10-12 NOTE — Telephone Encounter (Signed)
Refilled clopidogrel 75 mg to [facility].

## 2020-10-12 NOTE — Assessment & Plan Note (Signed)
Etiology unclear, no vomting today, is mild tender on exam but o/w nontoxic appearing; for labs as ordered including repeat cbc, lfts and lipase, and will hold on repeat imaging for now;  Ok for miralax daily for constipation and further recommendation will be pending lab results.

## 2020-10-13 NOTE — Telephone Encounter (Signed)
Spoke with dental office and they state that the patient needs to have 1 tooth extracted, 1 crown and 3 fillings. He also needs to have SRP deep cleaning on all four quadrants with local anesthesia. Appointments not scheduled yet as they do not schedule until pt is cleared.

## 2020-10-13 NOTE — Telephone Encounter (Signed)
   Primary Cardiologist: Bryan Lemma, MD  Chart reviewed as part of pre-operative protocol coverage. Simple dental extractions are considered low risk procedures per guidelines and generally do not require any specific cardiac clearance. It is also generally accepted that for simple extractions and dental cleanings, there is no need to interrupt blood thinner therapy.   SBE prophylaxis is not required for the patient.  I will route this recommendation to the requesting party via Epic fax function and remove from pre-op pool.  Please call with questions.  Ronney Asters, NP 10/13/2020, 12:11 PM

## 2020-10-13 NOTE — Telephone Encounter (Signed)
Please contact the requesting office and let them know that we do not provide blanket coverage.  Please ask for specific details surrounding his procedure.  We will then be able to better provide preprocedure cardiac recommendations.  Thank you for your help,  Thomasene Ripple. Zahir Eisenhour NP-C    10/13/2020, 9:57 AM [facility] [address] Office [phone] Fax [phone]

## 2020-10-14 NOTE — Telephone Encounter (Signed)
I will fax clearance to email address given in the clearance form.  [email or web address]

## 2020-10-24 ENCOUNTER — Other Ambulatory Visit: Payer: Self-pay

## 2020-10-24 ENCOUNTER — Ambulatory Visit (INDEPENDENT_AMBULATORY_CARE_PROVIDER_SITE_OTHER): Payer: 59 | Admitting: Emergency Medicine

## 2020-10-24 ENCOUNTER — Encounter: Payer: Self-pay | Admitting: Emergency Medicine

## 2020-10-24 VITALS — BP 130/70 | HR 95 | Temp 98.5°F | Ht 63.0 in | Wt 148.0 lb

## 2020-10-24 DIAGNOSIS — Z23 Encounter for immunization: Secondary | ICD-10-CM | POA: Diagnosis not present

## 2020-10-24 DIAGNOSIS — R1013 Epigastric pain: Secondary | ICD-10-CM | POA: Insufficient documentation

## 2020-10-24 DIAGNOSIS — R7401 Elevation of levels of liver transaminase levels: Secondary | ICD-10-CM | POA: Insufficient documentation

## 2020-10-24 DIAGNOSIS — K838 Other specified diseases of biliary tract: Secondary | ICD-10-CM | POA: Diagnosis not present

## 2020-10-24 LAB — COMPREHENSIVE METABOLIC PANEL
ALT: 57 U/L — ABNORMAL HIGH (ref 0–53)
AST: 27 U/L (ref 0–37)
Albumin: 4.4 g/dL (ref 3.5–5.2)
Alkaline Phosphatase: 100 U/L (ref 39–117)
BUN: 21 mg/dL (ref 6–23)
CO2: 29 mEq/L (ref 19–32)
Calcium: 9.3 mg/dL (ref 8.4–10.5)
Chloride: 103 mEq/L (ref 96–112)
Creatinine, Ser: 0.87 mg/dL (ref 0.40–1.50)
GFR: 95.72 mL/min (ref >60.00)
Glucose, Bld: 111 mg/dL — ABNORMAL HIGH (ref 70–99)
Potassium: 3.5 mEq/L (ref 3.5–5.1)
Sodium: 140 mEq/L (ref 135–145)
Total Bilirubin: 0.7 mg/dL (ref 0.2–1.2)
Total Protein: 7.5 g/dL (ref 6.0–8.3)

## 2020-10-24 LAB — LIPASE: Lipase: 23 U/L (ref 11.0–59.0)

## 2020-10-24 LAB — CBC WITH DIFFERENTIAL/PLATELET
Basophils Absolute: 0.1 10*3/uL (ref 0.0–0.1)
Basophils Relative: 0.8 % (ref 0.0–3.0)
Eosinophils Absolute: 0.2 10*3/uL (ref 0.0–0.7)
Eosinophils Relative: 2.5 % (ref 0.0–5.0)
HCT: 46.4 % (ref 39.0–52.0)
Hemoglobin: 15.8 g/dL (ref 13.0–17.0)
Lymphocytes Relative: 23.6 % (ref 12.0–46.0)
Lymphs Abs: 1.6 10*3/uL (ref 0.7–4.0)
MCHC: 34.1 g/dL (ref 30.0–36.0)
MCV: 89.2 fl (ref 78.0–100.0)
Monocytes Absolute: 0.4 10*3/uL (ref 0.1–1.0)
Monocytes Relative: 6.1 % (ref 3.0–12.0)
Neutro Abs: 4.4 10*3/uL (ref 1.4–7.7)
Neutrophils Relative %: 67 % (ref 43.0–77.0)
Platelets: 241 10*3/uL (ref 150.0–400.0)
RBC: 5.2 Mil/uL (ref 4.22–5.81)
RDW: 14 % (ref 11.5–15.5)
WBC: 6.6 10*3/uL (ref 4.0–10.5)

## 2020-10-24 NOTE — Patient Instructions (Signed)
Dolor abdominal en los adultos Abdominal Pain, Adult El dolor de estmago (abdominal) puede tener muchas causas. La mayora de las veces, el dolor de estmago no es peligroso. Muchos de estos casos de dolor de estmago pueden controlarse y tratarse en casa. Sin embargo, a veces, el dolor de estmago es grave. Elmdico intentar descubrir la causa del dolor de estmago. Siga estas instrucciones en su casa:  Medicamentos [place] los medicamentos de venta libre y los recetados solamente como se lo haya indicado el mdico. No tome medicamentos que lo ayuden a defecar (laxantes), salvo que el mdico se lo indique. Instrucciones generales Est atento al dolor de estmago para detectar cualquier cambio. Beba suficiente lquido para mantener el pis (la orina) de color amarillo plido. Concurra a todas las visitas de seguimiento como se lo haya indicado el mdico. Esto es importante. Comunquese con un mdico si: El dolor de estmago cambia o empeora. No tiene apetito o baja de peso sin proponrselo. Tiene dificultades para defecar (est estreido) o heces lquidas (diarrea) durante ms de 2 o 3 das. Siente dolor al orinar o defecar. El dolor de estmago lo despierta de noche. El dolor empeora con las comidas, despus de comer o con determinados alimentos. Tiene vmitos y no puede retener nada de lo que ingiere. Tiene fiebre. Observa sangre en la orina. Solicite ayuda de inmediato si: El dolor no desaparece en el tiempo indicado por el mdico. No puede dejar de vomitar. Siente dolor solamente en zonas especficas del abdomen, como el lado derecho o la parte inferior izquierda. Tiene heces con sangre, de color negro o con aspecto alquitranado. Tiene dolor muy intenso en el vientre, clicos o meteorismo. Presenta signos de no tener suficientes lquidos o agua en el cuerpo (deshidratacin), por ejemplo: Orina oscura, muy escasa o falta de orina. [name]. Sequedad de boca. Ojos  hundidos. Somnolencia. Debilidad. Tiene dificultad para respirar o dolor en el pecho. Resumen Muchos de estos casos de dolor de estmago pueden controlarse y tratarse en casa. Est atento al dolor de estmago para detectar cualquier cambio. Tome los medicamentos de venta libre y los recetados solamente como se lo haya indicado el mdico. Comunquese con un mdico si el dolor de estmago cambia o empeora. Busque ayuda de inmediato si tiene dolor muy intenso en el vientre, clicos o meteorismo. Esta informacin no tiene como fin reemplazar el consejo del mdico. Asegresede hacerle al mdico cualquier pregunta que tenga. Document Revised: 06/25/2018 Document Reviewed: 06/25/2018 Elsevier Patient Education  2022 Elsevier Inc.  

## 2020-10-24 NOTE — Assessment & Plan Note (Signed)
Recent finding on CT scan abdominal pelvis.  Cholestatic picture with blood work. Needs evaluation by GI doctor and possible ERCP.

## 2020-10-24 NOTE — Assessment & Plan Note (Signed)
Active and affecting quality of life.  Needs evaluation by GI doctor.

## 2020-10-24 NOTE — Assessment & Plan Note (Signed)
Elevated on 10/12/2020.  We will repeat today.  Still symptomatic.

## 2020-10-24 NOTE — Progress Notes (Signed)
[address] [place] 55 y.o.   Chief Complaint  Patient presents with   Hospitalization Follow-up    Stomach pain.    HISTORY OF PRESENT ILLNESS: This is a 55 y.o. male complaining of upper abdominal pain on and off for several weeks and months. Actually patient has history of laparoscopic cholecystectomy 7 years ago states he has been having similar pains on and off since then. Recent labs showed a cholestatic picture with elevated alkaline phosphatase, liver enzymes, total bilirubin and mild elevation of lipase. Recent CT scan of abdomen pelvis shows the following: CT ABDOMEN PELVIS W CONTRAST  Result Date: 10/12/2020 CLINICAL DATA:  Abdominal pain.  Elevated liver enzymes. EXAM: CT ABDOMEN AND PELVIS WITH CONTRAST TECHNIQUE: Multidetector CT imaging of the abdomen and pelvis was performed using the standard protocol following bolus administration of intravenous contrast. CONTRAST:  OMNIPAQUE IOHEXOL 300 MG/ML  SOLN COMPARISON:  CT abdomen pelvis 08/30/2020 FINDINGS: Lower chest: No acute abnormality. Hepatobiliary: No focal liver abnormality. Status post cholecystectomy. Pneumobilia. No biliary dilatation. Pancreas: No focal lesion. Normal pancreatic contour. No surrounding inflammatory changes. No main pancreatic ductal dilatation. Spleen: Normal in size without focal abnormality. Adrenals/Urinary Tract: No adrenal nodule bilaterally. Bilateral kidneys enhance symmetrically. There is a simple renal cyst measuring up to 2.4 cm on the left. There is another fluid density lesion measuring up to 1.7 cm in the left kidney within associated thin septation noted on coronal view. Subcentimeter hypodensities are too small to characterize. No hydronephrosis. No hydroureter. The urinary bladder is unremarkable. Stomach/Bowel: Stomach is within normal limits. No evidence of bowel wall thickening or dilatation. Appendix appears normal. Vascular/Lymphatic: No abdominal aorta or iliac aneurysm.  Mild atherosclerotic plaque of the aorta and its branches. No abdominal, pelvic, or inguinal lymphadenopathy. Reproductive: The prostate is enlarged measuring up to 5 cm. Partially visualized right hydrocele. Other: No intraperitoneal free fluid. No intraperitoneal free gas. No organized fluid collection. Musculoskeletal: No intraperitoneal free fluid. No intraperitoneal free gas. No organized fluid collection. IMPRESSION: 1. No acute intra-abdominal or intrapelvic abnormality in a patient status post cholecystectomy with associated pneumobilia. 2. Prostatomegaly. 3. Minimally complex 1.7 cm left renal cyst. 4. Right hydrocele. Electronically Signed   By: Tish Frederickson M.D.   On: 10/12/2020 17:44      HPI   Prior to Admission medications   Medication Sig Start Date End Date Taking? Authorizing Provider  amLODipine (NORVASC) 5 MG tablet Take 1 tablet (5 mg total) by mouth daily. 07/15/20  Yes O'Neal, Ronnald Ramp, MD  aspirin 81 MG EC tablet Take 1 tablet (81 mg total) by mouth daily. Swallow whole. 07/15/20  Yes O'Neal, Ronnald Ramp, MD  atorvastatin (LIPITOR) 80 MG tablet Take 1 tablet (80 mg total) by mouth daily. 07/15/20  Yes O'Neal, Ronnald Ramp, MD  clopidogrel (PLAVIX) 75 MG tablet Take 1 tablet (75 mg total) by mouth daily. 10/12/20  Yes Juquan Reznick, Eilleen Kempf, MD  lisinopril-hydrochlorothiazide (ZESTORETIC) 20-12.5 MG tablet Take 1 tablet by mouth daily. 05/31/20  Yes Beckem Tomberlin, Eilleen Kempf, MD  nitroGLYCERIN (NITROSTAT) 0.4 MG SL tablet Place 1 tablet (0.4 mg total) under the tongue every 5 (five) minutes x 3 doses as needed for chest pain. 07/15/20  Yes O'Neal, Ronnald Ramp, MD  ondansetron (ZOFRAN ODT) 4 MG disintegrating tablet Take 1 tablet (4 mg total) by mouth every 8 (eight) hours as needed for nausea or vomiting. 08/27/20  Yes Pricilla Loveless, MD  ondansetron (ZOFRAN) 4 MG tablet Take 1 tablet (4 mg total) by mouth  every 6 (six) hours. 10/12/20  Yes Curatolo, Adam, DO  pantoprazole  (PROTONIX) 40 MG tablet Take 1 tablet (40 mg total) by mouth daily. 07/16/20  Yes O'Neal, Ronnald Ramp, MD  polyethylene glycol powder (GLYCOLAX/MIRALAX) 17 GM/SCOOP powder Take 17 g by mouth 2 (two) times daily as needed. 10/11/20  Yes Corwin Levins, MD    No Known Allergies  Patient Active Problem List   Diagnosis Date Noted   Coronary artery disease involving native coronary artery of native heart with angina pectoris (HCC) 07/15/2020   Prediabetes 07/15/2020   NSTEMI (non-ST elevated myocardial infarction) (HCC) 07/14/2020   BMI 30.0-30.9,adult 08/30/2015   Hyperlipidemia with target LDL less than 70 08/30/2015   Abdominal pain 11/21/2012   Essential hypertension 10/11/2012    Past Medical History:  Diagnosis Date   Aortic stenosis, mild 07/2012   Very mild aortic stenosis noted on echo-mean gradient 11 mmHg.   Bile duct leak    CAD in native artery 07/14/2020   Small branch of OM 3 with 80% ostial stenosis.  Too small for PCI.  Otherwise questionable 30% LM and 30% PDA.   Chronic kidney disease 2017   left kidney stones   Headache(784.0)    Hx: of when BP is elevated   Hyperlipidemia    Hypertension    Non-STEMI (non-ST elevated myocardial infarction) (HCC) 07/13/2020   Admitted with hypertension and chest pain.  Cardiac cath showed small branch of an OM3 with 85% ostial stenosis not amenable to PCI.  No other significant disease noted.  Normal EF on echo.  Medical management.   Numbness and tingling in hands    Hx: of   Prediabetes     Past Surgical History:  Procedure Laterality Date   CHOLECYSTECTOMY N/A 07/11/2012   Procedure: LAPAROSCOPIC CHOLECYSTECTOMY WITH INTRAOPERATIVE CHOLANGIOGRAM;  Surgeon: Axel Filler, MD;  Location: [facility] OR;  Service: General;  Laterality: N/A;   COLONOSCOPY     ERCP  07/16/2012   ERCP N/A 07/16/2012   Procedure: ENDOSCOPIC RETROGRADE CHOLANGIOPANCREATOGRAPHY (ERCP);  Surgeon: Louis Meckel, MD;  Location: [facility] OR;  Service:  Gastroenterology;  Laterality: N/A;   ERCP N/A 10/06/2012   Procedure: ENDOSCOPIC RETROGRADE CHOLANGIOPANCREATOGRAPHY (ERCP);  Surgeon: Louis Meckel, MD;  Location: [facility];  Service: Endoscopy;  Laterality: N/A;   HAND SURGERY     LEFT HEART CATH AND CORONARY ANGIOGRAPHY N/A 07/14/2020   Procedure: LEFT HEART CATH AND CORONARY ANGIOGRAPHY;  Surgeon: Lyn Records, MD;  Location: [facility] INVASIVE CV LAB;  Service: Cardiovascular; ? Culprit Lesion ~ 80% ostial small OM3 (too small & ostial for PCI). ~30% mid LM (at a bend).  LAD with D1 & D2 - normal, RCA minimal luminal irregularities ~ 30%. PDA   TRANSTHORACIC ECHOCARDIOGRAM  07/14/2020   (NSTEMI/Accelerated Hypertension): EF 55 to 60%.  No RWM A.  Very mild Aortic Stenosis-mean gradient 11 to mmHg.    Social History   Socioeconomic History   Marital status: Married    Spouse name: Davonna Belling   Number of children: 3   Years of education: 6th grade   Highest education level: Not on file  Occupational History   Occupation: Painting    Employer: [employer], [place], [place]    Comment: Mostly indoor painting  Tobacco Use   Smoking status: Never    Passive exposure: Never   Smokeless tobacco: Never  Vaping Use   Vaping Use: Never used  Substance and Sexual Activity   Alcohol use: No  Drug use: No   Sexual activity: Yes    Partners: Female  Other Topics Concern   Not on file  Social History Narrative   Originally from [place], [place]. Came to the [place] in 1991.   Lives with his wife and their youngest son   His daughter lives in [place], [place] with her husband.  Second son is now moved out of the house as well.   He gets a decent amount of exercise walking around at work, but does not do routine exercise.  Does not drink or smoke   Social Determinants of Health   Financial Resource Strain: Not on file  Food Insecurity: Not on file  Transportation Needs: Not on file  Physical Activity: Not on file   Stress: Not on file  Social Connections: Not on file  Intimate Partner Violence: Not on file    Family History  Problem Relation Age of Onset   Gallstones Mother    Hypertension Mother    Gallstones Brother    Diabetes Paternal Uncle      Review of Systems  Constitutional: Negative.  Negative for chills and fever.  HENT: Negative.  Negative for congestion and sore throat.   Respiratory: Negative.  Negative for cough and shortness of breath.   Cardiovascular: Negative.  Negative for chest pain and palpitations.  Gastrointestinal:  Positive for abdominal pain. Negative for blood in stool, diarrhea, melena, nausea and vomiting.  Genitourinary: Negative.  Negative for dysuria and hematuria.  Musculoskeletal: Negative.   Skin: Negative.  Negative for rash.  Neurological: Negative.  Negative for dizziness and headaches.  All other systems reviewed and are negative.   Physical Exam Vitals reviewed.  Constitutional:      Appearance: Normal appearance.  HENT:     Head: Normocephalic.  Eyes:     Extraocular Movements: Extraocular movements intact.     Pupils: Pupils are equal, round, and reactive to light.  Cardiovascular:     Rate and Rhythm: Normal rate and regular rhythm.     Pulses: Normal pulses.     Heart sounds: Normal heart sounds.  Pulmonary:     Effort: Pulmonary effort is normal.     Breath sounds: Normal breath sounds.  Abdominal:     General: Bowel sounds are normal. There is no distension.     Palpations: Abdomen is soft.     Tenderness: There is abdominal tenderness (Mild epigastric tenderness). There is no guarding or rebound.  Musculoskeletal:        General: Normal range of motion.     Cervical back: Normal range of motion and neck supple.     Right lower leg: No edema.     Left lower leg: No edema.  Skin:    General: Skin is warm and dry.     Capillary Refill: Capillary refill takes less than 2 seconds.  Neurological:     General: No focal deficit  present.     Mental Status: He is alert and oriented to person, place, and time.  Psychiatric:        Mood and Affect: Mood normal.        Behavior: Behavior normal.     ASSESSMENT & PLAN: A total of 47 minutes was spent with the patient and counseling/coordination of care regarding preparing for this visit, review of most recent office visit notes, review of most recent emergency department visit notes, review of most recent blood work results, review of most recent CT scan of abdomen and pelvis  results, differential diagnosis of pneumobilia, need for GI evaluation and possible intervention, ED precautions, education on nutrition, prognosis, documentation and need for follow-up soon after GI evaluation.  Problem List Items Addressed This Visit       Digestive   Pneumobilia - Primary    Recent finding on CT scan abdominal pelvis.  Cholestatic picture with blood work. Needs evaluation by GI doctor and possible ERCP.      Relevant Orders   Ambulatory referral to Gastroenterology     Other   Transaminitis    Elevated on 10/12/2020.  We will repeat today.  Still symptomatic.      Relevant Orders   Comprehensive metabolic panel   Lipase   Ambulatory referral to Gastroenterology   Epigastric abdominal pain    Active and affecting quality of life.  Needs evaluation by GI doctor.      Relevant Orders   CBC with Differential/Platelet   Ambulatory referral to Gastroenterology   Other Visit Diagnoses     Need for shingles vaccine       Relevant Orders   Varicella-zoster vaccine IM (Shingrix) (Completed)      Patient Instructions  Dolor abdominal en los adultos Abdominal Pain, Adult El dolor de [place] (abdominal) puede tener muchas causas. La mayora de las veces, el dolor de [place] no es peligroso. Muchos de [employer] de dolor de estmago pueden controlarse y tratarse en casa. Sin embargo, a Occupational psychologist, Chief Technology Officer de [place] es grave. El mdico intentar descubrir la causa del  dolor de [place]. Siga estas instrucciones en su casa: Medicamentos [employer] de venta libre y los recetados solamente como se lo haya indicado el mdico. No tome medicamentos que lo ayuden a Advertising copywriter (laxantes), salvo que el mdico se lo indique. Instrucciones generales Est atento al dolor de estmago para Insurance risk surveyor cambio. Beba suficiente lquido para Radio producer pis (la orina) de color amarillo plido. Concurra a todas las visitas de [address] se lo haya indicado el mdico. Esto es importante. Comunquese con un mdico si: El dolor de [address] cambia o [place]. No tiene apetito o baja de peso sin proponrselo. Tiene dificultades para defecar (est estreido) o heces lquidas (diarrea) durante ms de 2 o 3 das. Siente dolor al orinar o defecar. El dolor de estmago lo despierta de noche. El dolor empeora con las comidas, despus de comer o con determinados alimentos. Tiene vmitos y no puede retener nada de lo que ingiere. Tiene fiebre. Observa sangre en la orina. Solicite ayuda de inmediato si: El dolor no desaparece en el tiempo indicado por el mdico. No puede dejar de vomitar. Siente dolor solamente en zonas especficas del abdomen, como el lado derecho o la parte inferior izquierda. Tiene heces con sangre, de color negro o con aspecto alquitranado. Tiene dolor muy intenso en el vientre, clicos o meteorismo. Presenta signos de no tener suficientes lquidos o agua en el cuerpo (deshidratacin), por ejemplo: [place], muy escasa o falta de orina. [name]. Sequedad de boca. Ojos hundidos. Somnolencia. Debilidad. Tiene dificultad para respirar o Journalist, newspaper. Resumen Muchos de [employer] de dolor de estmago pueden controlarse y tratarse en casa. Est atento al dolor de estmago para Insurance risk surveyor cambio. Tome los medicamentos de venta libre y los recetados solamente como se lo haya indicado el mdico. Comunquese con un  mdico si el dolor de estmago cambia o [place]. Busque ayuda de inmediato si tiene dolor muy intenso en el vientre, clicos o meteorismo. Esta  informacin no tiene Theme park manager el consejo del mdico. Asegrese de hacerle al mdico cualquier pregunta que tenga. Document Revised: 06/25/2018 Document Reviewed: 06/25/2018 Elsevier Patient Education  2022 Elsevier Inc.    Edwina Barth, MD [facility]

## 2020-11-20 NOTE — Progress Notes (Signed)
Cardiology Office Note:    Date:  11/21/2020   ID:  Ethan, Silva [birth year], MRN [id number]  PCP:  Horald Pollen, [place] Cardiologist: Glenetta Hew, MD   Reason for visit: 4 month follow-up  History of Present Illness:    Ethan Silva is a 55 y.o. male with a hx of small vessel CAD, hypertension, hyperlipidemia and prediabetes.  He was admitted to [facility] in July 2022 with chest pain and hypertension.  Found to have NSTEMI.  Cardiac cath showed potential culprit is a very small caliber branch of the OM (not amenable to PCI).  He was medically managed.  Normal EF.  He was seen by Dr. Debara Pickett in July 2022.  Chest pain resolved.  Dr. Debara Pickett believe the patient's elevated troponin and angina was related to HTN in setting of known vessel disease.  His lipitor dose was increased with LDL 115.  Today, the patient states his blood pressures typically better at home.  Highest he gets to is around 140.  He takes lisinopril HCTZ 20-12.5 mg only if his systolic blood pressures over 160.  He mentions a little chest pain that resolves when he takes his daily medications.  He complains about multiple GI symptoms which she recently went to the ER for including the sensation of feeling air in his esophagus related to reflux.  He is asking for a refill on his Protonix prescription.  Also requesting GI referral.  He has a history of transverse colon colitis and transaminitis.  Recent CV studies:  TTE 07/14/2020 (NSTEMI/Accelerated Hypertension): EF 55 to 60%.  No RWM A.  Very mild Aortic Stenosis-mean gradient 88mmHg. Cardiac Cath 07/14/2020: ? Culprit Lesion ~ 80% ostial small OM3 (too small & ostial for PCI). ~30% mid LM (at a bend).  LAD with D1 & D2 - normal, RCA minimal luminal irregularities ~ 30%. PDA     Past Medical History:  Diagnosis Date   Aortic stenosis, mild 07/2012   Very mild aortic stenosis noted on echo-mean gradient 11 mmHg.    Bile duct leak    CAD in native artery 07/14/2020   Small branch of OM 3 with 80% ostial stenosis.  Too small for PCI.  Otherwise questionable 30% LM and 30% PDA.   Chronic kidney disease 2017   left kidney stones   Headache(784.0)    Hx: of when BP is elevated   Hyperlipidemia    Hypertension    Non-STEMI (non-ST elevated myocardial infarction) (Ethan Silva) 07/13/2020   Admitted with hypertension and chest pain.  Cardiac cath showed small branch of an OM3 with 85% ostial stenosis not amenable to PCI.  No other significant disease noted.  Normal EF on echo.  Medical management.   Numbness and tingling in hands    Hx: of   Prediabetes     Past Surgical History:  Procedure Laterality Date   CHOLECYSTECTOMY N/A 07/11/2012   Procedure: LAPAROSCOPIC CHOLECYSTECTOMY WITH INTRAOPERATIVE CHOLANGIOGRAM;  Surgeon: Ethan Ok, MD;  Location: [facility];  Service: General;  Laterality: N/A;   COLONOSCOPY     ERCP  07/16/2012   ERCP N/A 07/16/2012   Procedure: ENDOSCOPIC RETROGRADE CHOLANGIOPANCREATOGRAPHY (ERCP);  Surgeon: Ethan Castle, MD;  Location: [facility];  Service: Gastroenterology;  Laterality: N/A;   ERCP N/A 10/06/2012   Procedure: ENDOSCOPIC RETROGRADE CHOLANGIOPANCREATOGRAPHY (ERCP);  Surgeon: Ethan Castle, MD;  Location: [facility];  Service: Endoscopy;  Laterality: N/A;   HAND SURGERY     LEFT HEART  CATH AND CORONARY ANGIOGRAPHY N/A 07/14/2020   Procedure: LEFT HEART CATH AND CORONARY ANGIOGRAPHY;  Surgeon: Ethan Crome, MD;  Location: [facility];  Service: Cardiovascular; ? Culprit Lesion ~ 80% ostial small OM3 (too small & ostial for PCI). ~30% mid LM (at a bend).  LAD with D1 & D2 - normal, RCA minimal luminal irregularities ~ 30%. PDA   TRANSTHORACIC ECHOCARDIOGRAM  07/14/2020   (NSTEMI/Accelerated Hypertension): EF 55 to 60%.  No RWM A.  Very mild Aortic Stenosis-mean gradient 11 to mmHg.    Current Medications: Current Meds  Medication Sig   aspirin 81 MG EC  tablet Take 1 tablet (81 mg total) by mouth daily. Swallow whole.   atorvastatin (LIPITOR) 80 MG tablet Take 1 tablet (80 mg total) by mouth daily.   clopidogrel (PLAVIX) 75 MG tablet Take 1 tablet (75 mg total) by mouth daily.   lisinopril (ZESTRIL) 10 MG tablet Take 1 tablet (10 mg total) by mouth daily.   nitroGLYCERIN (NITROSTAT) 0.4 MG SL tablet Place 1 tablet (0.4 mg total) under the tongue every 5 (five) minutes x 3 doses as needed for chest pain.   ondansetron (ZOFRAN ODT) 4 MG disintegrating tablet Take 1 tablet (4 mg total) by mouth every 8 (eight) hours as needed for nausea or vomiting.   ondansetron (ZOFRAN) 4 MG tablet Take 1 tablet (4 mg total) by mouth every 6 (six) hours.   polyethylene glycol powder (GLYCOLAX/MIRALAX) 17 GM/SCOOP powder Take 17 g by mouth 2 (two) times daily as needed.   [DISCONTINUED] amLODipine (NORVASC) 5 MG tablet Take 1 tablet (5 mg total) by mouth daily.   [DISCONTINUED] pantoprazole (PROTONIX) 40 MG tablet Take 1 tablet (40 mg total) by mouth daily.     Allergies:   Patient has no known allergies.   Social History   Socioeconomic History   Marital status: Married    Spouse name: Alyson Locket   Number of children: 3   Years of education: 6th grade   Highest education level: Not on file  Occupational History   Occupation: Painting    Employer: [employer], [place], [place]    Comment: Mostly indoor painting  Tobacco Use   Smoking status: Never    Passive exposure: Never   Smokeless tobacco: Never  Vaping Use   Vaping Use: Never used  Substance and Sexual Activity   Alcohol use: No   Drug use: No   Sexual activity: Yes    Partners: Female  Other Topics Concern   Not on file  Social History Narrative   Originally from [place], [place]. [place] to the [place].   Lives with his wife and their youngest son   His daughter lives in [place], [place] with her husband.  Second son is now moved out of the house as well.   He gets a  decent amount of exercise walking around at work, but does not do routine exercise.  Does not drink or smoke   Social Determinants of Health   Financial Resource Strain: Not on file  Food Insecurity: Not on file  Transportation Needs: Not on file  Physical Activity: Not on file  Stress: Not on file  Social Connections: Not on file     Family History: The patient's family history includes Diabetes in his paternal uncle; Gallstones in his brother and mother; Hypertension in his mother.  ROS:   Please see the history of present illness.     EKGs/Labs/Other Studies Reviewed:  Recent Labs: 10/24/2020: ALT 57; BUN 21; Creatinine, Ser 0.87; Hemoglobin 15.8; Platelets 241.0; Potassium 3.5; Sodium 140   Recent Lipid Panel Lab Results  Component Value Date/Time   CHOL 162 07/14/2020 04:02 AM   CHOL 204 (H) 09/17/2019 11:20 AM   TRIG 33 07/14/2020 04:02 AM   HDL 40 (L) 07/14/2020 04:02 AM   HDL 44 09/17/2019 11:20 AM   LDLCALC 115 (H) 07/14/2020 04:02 AM   LDLCALC 137 (H) 09/17/2019 11:20 AM    Physical Exam:    VS:  BP (!) 150/98   Pulse 62   Ht 5\' 3"  (1.6 m)   Wt 154 lb (69.9 kg)   SpO2 97%   BMI 27.28 kg/m    No data found.  Wt Readings from Last 3 Encounters:  11/21/20 154 lb (69.9 kg)  10/24/20 148 lb (67.1 kg)  10/12/20 147 lb (66.7 kg)     GEN:  Well nourished, well developed in no acute distress HEENT: Normal NECK: No JVD; No carotid bruits CARDIAC: RRR, no murmurs, rubs, gallops RESPIRATORY:  Clear to auscultation without rales, wheezing or rhonchi  ABDOMEN: Soft, non-tender, non-distended MUSCULOSKELETAL: No edema; No deformity  SKIN: Warm and dry NEUROLOGIC:  Alert and oriented PSYCHIATRIC:  Normal affect     ASSESSMENT AND PLAN   CAD, stable -Left heart cath 07/14/2020: ? Culprit Lesion ~ 80% ostial small OM3 (too small & ostial for PCI). ~30% mid LM (at a bend).  LAD with D1 & D2 - normal, RCA minimal luminal irregularities ~ 30%. PDA -We  reviewed his left heart cath images together. -Continue DAPT x1 year.  Continue statin and blood pressure control.  Hypertension, BP elevated today -Blood pressure 150/98 today.  Refill amlodipine 5 mg daily.  Start lisinopril 10 mg daily.  Follow-up in 3 months. -Goal BP is <130/80.  Recommend DASH diet (high in vegetables, fruits, low-fat dairy products, whole grains, poultry, fish, and nuts and low in sweets, sugar-sweetened beverages, and red meats), salt restriction and increase physical activity.  Hyperlipidemia -LDL 115 in 07/2020.  Lipitor increased from 40mg  to 80mg  daily.   -Check fasting lipids today. -Discussed cholesterol lowering diets - Mediterranean diet, DASH diet, vegetarian diet, low-carbohydrate diet and avoidance of trans fats.  Discussed healthier choice substitutes.  Nuts, high-fiber foods, and fiber supplements may also improve lipids.    GERD -Given patient information regarding GERD.  Discussed eating smaller meals more frequently and avoiding trigger foods. -Refilled 90 days of Protonix (1 refill) and sent GI referral.  Disposition - Follow-up in 3 months to recheck blood pressure.  Follow-up in July with Dr. Ellyn Hack - likely can stop Plavix (at 1 year post NSTEMI).   Medication Adjustments/Labs and Tests Ordered: Current medicines are reviewed at length with the patient today.  Concerns regarding medicines are outlined above.  Orders Placed This Encounter  Procedures   Lipid panel   Ambulatory referral to Gastroenterology   Meds ordered this encounter  Medications   amLODipine (NORVASC) 5 MG tablet    Sig: Take 1 tablet (5 mg total) by mouth daily.    Dispense:  30 tablet    Refill:  11   lisinopril (ZESTRIL) 10 MG tablet    Sig: Take 1 tablet (10 mg total) by mouth daily.    Dispense:  90 tablet    Refill:  3   pantoprazole (PROTONIX) 40 MG tablet    Sig: Take 1 tablet (40 mg total) by mouth daily.    Dispense:  90 tablet    Refill:  1    Patient  Instructions  Medication Instructions:  Start Lisinopril 10 mg ( 1 Tablet Daily). *If you need a refill on your cardiac medications before your next appointment, please call your pharmacy*   Lab Work: Lipid Panel : Today If you have labs (blood work) drawn today and your tests are completely normal, you will receive your results only by: MyChart Message (if you have MyChart) OR A paper copy in the mail If you have any lab test that is abnormal or we need to change your treatment, we will call you to review the results.   Testing/Procedures: No Tessting   Follow-Up: At [facility], you and your health needs are our priority.  As part of our continuing mission to provide you with exceptional heart care, we have created designated Provider Care Teams.  These Care Teams include your primary Cardiologist (physician) and Advanced Practice Providers (APPs -  Physician Assistants and Nurse Practitioners) who all work together to provide you with the care you need, when you need it.  We recommend signing up for the patient portal called "MyChart".  Sign up information is provided on this After Visit Summary.  MyChart is used to connect with patients for Virtual Visits (Telemedicine).  Patients are able to view lab/test results, encounter notes, upcoming appointments, etc.  Non-urgent messages can be sent to your provider as well.   To learn more about what you can do with MyChart, go to [email or web address].    Your next appointment:   3 month(s)  The format for your next appointment:   In Person  Provider:   [name], PA-C    Then, Bryan Lemma, MD will plan to see you again in 1 year(s).      Signed, Cannon Kettle, PA-C  11/21/2020 10:48 AM    [facility]

## 2020-11-21 ENCOUNTER — Other Ambulatory Visit: Payer: Self-pay

## 2020-11-21 ENCOUNTER — Ambulatory Visit (INDEPENDENT_AMBULATORY_CARE_PROVIDER_SITE_OTHER): Payer: 59 | Admitting: Physician Assistant

## 2020-11-21 ENCOUNTER — Encounter: Payer: Self-pay | Admitting: Physician Assistant

## 2020-11-21 VITALS — BP 150/98 | HR 62 | Ht 63.0 in | Wt 154.0 lb

## 2020-11-21 DIAGNOSIS — I10 Essential (primary) hypertension: Secondary | ICD-10-CM | POA: Diagnosis not present

## 2020-11-21 DIAGNOSIS — E785 Hyperlipidemia, unspecified: Secondary | ICD-10-CM

## 2020-11-21 DIAGNOSIS — I25119 Atherosclerotic heart disease of native coronary artery with unspecified angina pectoris: Secondary | ICD-10-CM | POA: Diagnosis not present

## 2020-11-21 MED ORDER — PANTOPRAZOLE SODIUM 40 MG PO TBEC: 40.0000 mg | DELAYED_RELEASE_TABLET | Freq: Every day | ORAL | 1 refills | Status: DC

## 2020-11-21 MED ORDER — LISINOPRIL 10 MG PO TABS: 10.0000 mg | ORAL_TABLET | Freq: Every day | ORAL | 3 refills | Status: DC

## 2020-11-21 MED ORDER — AMLODIPINE BESYLATE 5 MG PO TABS
5.0000 mg | ORAL_TABLET | Freq: Every day | ORAL | 11 refills | Status: DC
Start: 2020-11-21 — End: 2021-02-01

## 2020-11-21 NOTE — Patient Instructions (Signed)
Medication Instructions:  Start Lisinopril 10 mg ( 1 Tablet Daily). *If you need a refill on your cardiac medications before your next appointment, please call your pharmacy*   Lab Work: Lipid Panel : Today If you have labs (blood work) drawn today and your tests are completely normal, you will receive your results only by: MyChart Message (if you have MyChart) OR A paper copy in the mail If you have any lab test that is abnormal or we need to change your treatment, we will call you to review the results.   Testing/Procedures: No Tessting   Follow-Up: At [facility], you and your health needs are our priority.  As part of our continuing mission to provide you with exceptional heart care, we have created designated Provider Care Teams.  These Care Teams include your primary Cardiologist (physician) and Advanced Practice Providers (APPs -  Physician Assistants and Nurse Practitioners) who all work together to provide you with the care you need, when you need it.  We recommend signing up for the patient portal called "MyChart".  Sign up information is provided on this After Visit Summary.  MyChart is used to connect with patients for Virtual Visits (Telemedicine).  Patients are able to view lab/test results, encounter notes, upcoming appointments, etc.  Non-urgent messages can be sent to your provider as well.   To learn more about what you can do with MyChart, go to [email or web address].    Your next appointment:   3 month(s)  The format for your next appointment:   In Person  Provider:   [name], PA-C    Then, Bryan Lemma, MD will plan to see you again in 1 year(s).

## 2020-11-22 LAB — LIPID PANEL
Chol/HDL Ratio: 3.6 ratio (ref 0.0–5.0)
Cholesterol, Total: 178 mg/dL (ref 100–199)
HDL: 49 mg/dL (ref >39)
LDL Chol Calc (NIH): 110 mg/dL — ABNORMAL HIGH (ref 0–99)
Triglycerides: 107 mg/dL (ref 0–149)
VLDL Cholesterol Cal: 19 mg/dL (ref 5–40)

## 2020-11-28 ENCOUNTER — Telehealth: Payer: Self-pay

## 2020-11-28 NOTE — Telephone Encounter (Addendum)
Attempted to call patient regarding results. Unable to leave voicemail.----- Message from Cannon Kettle, PA-C sent at 11/22/2020 11:11 AM EST ----- LDL decreased only slightly to 110.  Our goal LDL is <70 with your known heart disease.   Therefore, I would like to change you to Crestor 40mg  daily and add Zetia 10mg  daily to help get your cholesterol to goal.  Focus on good eating habits and we will recheck your cholesterol in 3 months.

## 2020-11-30 ENCOUNTER — Ambulatory Visit: Payer: 59

## 2020-12-07 ENCOUNTER — Telehealth: Payer: Self-pay

## 2020-12-07 MED ORDER — EZETIMIBE 10 MG PO TABS: 10.0000 mg | ORAL_TABLET | Freq: Every day | ORAL | 3 refills | Status: DC

## 2020-12-07 MED ORDER — ROSUVASTATIN CALCIUM 40 MG PO TABS: 40.0000 mg | ORAL_TABLET | Freq: Every day | ORAL | 3 refills | Status: DC

## 2020-12-07 NOTE — Telephone Encounter (Addendum)
Spoke with patient regarding lab results advised patient medication change . Per Victorino Dike patient will tart Crestor 40 mg daily. And Zetia 10 mg daily.----- Message from Cannon Kettle, PA-C sent at 11/22/2020 11:11 AM EST ----- LDL decreased only slightly to 110.  Our goal LDL is <70 with your known heart disease.   Therefore, I would like to change you to Crestor 40mg  daily and add Zetia 10mg  daily to help get your cholesterol to goal.  Focus on good eating habits and we will recheck your cholesterol in 3 months.

## 2020-12-13 ENCOUNTER — Encounter: Payer: Self-pay | Admitting: Gastroenterology

## 2021-01-10 ENCOUNTER — Telehealth: Payer: Self-pay

## 2021-01-10 ENCOUNTER — Encounter: Payer: Self-pay | Admitting: Gastroenterology

## 2021-01-10 ENCOUNTER — Ambulatory Visit (INDEPENDENT_AMBULATORY_CARE_PROVIDER_SITE_OTHER): Payer: 59 | Admitting: Gastroenterology

## 2021-01-10 ENCOUNTER — Other Ambulatory Visit (INDEPENDENT_AMBULATORY_CARE_PROVIDER_SITE_OTHER): Payer: 59

## 2021-01-10 VITALS — BP 140/94 | HR 65 | Ht 66.0 in | Wt 156.0 lb

## 2021-01-10 DIAGNOSIS — R748 Abnormal levels of other serum enzymes: Secondary | ICD-10-CM

## 2021-01-10 DIAGNOSIS — R1013 Epigastric pain: Secondary | ICD-10-CM | POA: Diagnosis not present

## 2021-01-10 DIAGNOSIS — Z9049 Acquired absence of other specified parts of digestive tract: Secondary | ICD-10-CM

## 2021-01-10 DIAGNOSIS — R140 Abdominal distension (gaseous): Secondary | ICD-10-CM | POA: Diagnosis not present

## 2021-01-10 DIAGNOSIS — R1010 Upper abdominal pain, unspecified: Secondary | ICD-10-CM

## 2021-01-10 DIAGNOSIS — R12 Heartburn: Secondary | ICD-10-CM

## 2021-01-10 DIAGNOSIS — Z9889 Other specified postprocedural states: Secondary | ICD-10-CM | POA: Diagnosis not present

## 2021-01-10 LAB — IBC PANEL
Iron: 104 ug/dL (ref 42–165)
Saturation Ratios: 25 % (ref 20.0–50.0)
TIBC: 415.8 ug/dL (ref 250.0–450.0)
Transferrin: 297 mg/dL (ref 212.0–360.0)

## 2021-01-10 LAB — HEPATIC FUNCTION PANEL
ALT: 41 U/L (ref 0–53)
AST: 28 U/L (ref 0–37)
Albumin: 4.4 g/dL (ref 3.5–5.2)
Alkaline Phosphatase: 38 U/L — ABNORMAL LOW (ref 39–117)
Bilirubin, Direct: 0.1 mg/dL (ref 0.0–0.3)
Total Bilirubin: 0.5 mg/dL (ref 0.2–1.2)
Total Protein: 7.2 g/dL (ref 6.0–8.3)

## 2021-01-10 LAB — H. PYLORI ANTIBODY, IGG: H Pylori IgG: NEGATIVE

## 2021-01-10 LAB — FERRITIN: Ferritin: 118.7 ng/mL (ref 22.0–322.0)

## 2021-01-10 LAB — GAMMA GT: GGT: 17 U/L (ref 7–51)

## 2021-01-10 NOTE — Telephone Encounter (Signed)
[facility] Pre-operative Risk Assessment     Request for surgical clearance:     Endoscopy Procedure  What type of surgery is being performed?     Endoscopy   When is this surgery scheduled?     01/24/21  What type of clearance is required ?   Pharmacy  Are there any medications that need to be held prior to surgery and how long? Plavix 5 days   Practice name and name of physician performing surgery?      [facility]  What is your office phone and fax number?      Phone- [phone]  [phone]  Anesthesia type (None, local, MAC, general) ?       MAC

## 2021-01-10 NOTE — Progress Notes (Signed)
HPI : Ethan Silva is a 56 year old Hispanic male with a history of CAD, HTN and remote history of  cholecystectomy c/b bile leak who is referred to [place] by [name] for further evaluation of chronic abdominal pain and bloating.  Patient states the he has these symptoms ever since his gallbladder was removed in 2014.    Of note, the patient primarily speaks Spanish and is accompanied by a medical interpreter, but even the interpreter had difficulty understanding the patient.  Obtaining a clear history and description of his symptoms was difficult. He states that he feels very bloated and gassy when he eats.  He has nausea and vomiting sometimes after eating, and this usually improves his bloating/discomfort.  He reports pain in the left upper abdomen which is separate from the bloating.  He has symptoms most days.  He denies abdominal discomfort unless he eats.   He has a history of heartburn as well.  No problems with acid regurgitation.  His heartburn improved after he started taking Protonix in July.  His other upper GI symptoms did not seem to improve with the Protonix.  He describes another chest pain, not burning in quality, which is sometimes noted on the left side and sometimes on the right.  The pain is not associated with physical exertion or activity.  It does awaken him from sleep. He has regular bowel movements, with one bowel movement daily.  His stools are sometimes loose, but usually formed.  No blood in the stool.  He had a normal colonoscopy in 2015.  His weight has been stable. He denies any family history of GI malignancy.  He used to take NSAIDs frequently for headaches, but stopped in July  He went to the ED in August 24th with complaints of epigastric pain and weakness, as well as subjective fever, body aches and cough.  Also having nausea with vomiting.  He was found to be COVID positive, and found to have significant liver enzymes abnormalities (AST 958, ALT 671, Tbili  2.3), WBC 12.6.  CT scan showed suspected thickening of the transverse colon.  A very small focus of air in the biliary tree was noted on my review of the CT. His liver enzyme abnormalities were felt to be most likely related to his COVID infection and he was given Paxlovid with recommendations for outpatient GI follow up.  He went to the ED again on August 30th with complaints of constipation and abdominal pain with nausea but no vomiting.  He was noted to have elevated liver enzymes in a mixed pattern improved from the week previous (ALT 623, AST 254, ALP 213, Tbili 1.4).  CT showed large stool burden without biliary dilation.  A small focus of air was seen in the biliary tree on my review of the CT.  He was discharged home with laxatives.  His liver enzymes were checked in September and were largely unremarkable, with an ALT 59, but otherwise normal.  He went to the ED on October 12th with epigastric pain, where he was found to have significantly elevated LAEs in a mixed pattern (ALT 463, AST 397, ALP 354, Tbili 2.1).  A CT scan showed significant pneumobilia.  No specific diagnosis or therapy was given and he was advised to follow up with GI.  When he saw his PCP 2 weeks later, his symptoms and liver enzymes were much improved (ALT 57).   Past Medical History:  Diagnosis Date   Aortic stenosis, mild 07/2012  Very mild aortic stenosis noted on echo-mean gradient 11 mmHg.   Bile duct leak    CAD in native artery 07/14/2020   Small branch of OM 3 with 80% ostial stenosis.  Too small for PCI.  Otherwise questionable 30% LM and 30% PDA.   Chronic kidney disease 2017   left kidney stones   Headache(784.0)    Hx: of when BP is elevated   Hyperlipidemia    Hypertension    Non-STEMI (non-ST elevated myocardial infarction) ([place]) 07/13/2020   Admitted with hypertension and chest pain.  Cardiac cath showed small branch of an OM3 with 85% ostial stenosis not amenable to PCI.  No other significant  disease noted.  Normal EF on echo.  Medical management.   Numbness and tingling in hands    Hx: of   Prediabetes      Past Surgical History:  Procedure Laterality Date   CHOLECYSTECTOMY N/A 07/11/2012   Procedure: LAPAROSCOPIC CHOLECYSTECTOMY WITH INTRAOPERATIVE CHOLANGIOGRAM;  Surgeon: Ralene Ok, MD;  Location: [facility];  Service: General;  Laterality: N/A;   COLONOSCOPY     ERCP  07/16/2012   ERCP N/A 07/16/2012   Procedure: ENDOSCOPIC RETROGRADE CHOLANGIOPANCREATOGRAPHY (ERCP);  Surgeon: Inda Castle, MD;  Location: [facility];  Service: Gastroenterology;  Laterality: N/A;   ERCP N/A 10/06/2012   Procedure: ENDOSCOPIC RETROGRADE CHOLANGIOPANCREATOGRAPHY (ERCP);  Surgeon: Inda Castle, MD;  Location: [facility];  Service: Endoscopy;  Laterality: N/A;   HAND SURGERY     LEFT HEART CATH AND CORONARY ANGIOGRAPHY N/A 07/14/2020   Procedure: LEFT HEART CATH AND CORONARY ANGIOGRAPHY;  Surgeon: Belva Crome, MD;  Location: [facility];  Service: Cardiovascular; ? Culprit Lesion ~ 80% ostial small OM3 (too small & ostial for PCI). ~30% mid LM (at a bend).  LAD with D1 & D2 - normal, RCA minimal luminal irregularities ~ 30%. PDA   TRANSTHORACIC ECHOCARDIOGRAM  07/14/2020   (NSTEMI/Accelerated Hypertension): EF 55 to 60%.  No RWM A.  Very mild Aortic Stenosis-mean gradient 11 to mmHg.   ERCP July 2014: treatment of bile leak following cholecystectomy ERCP Oct 2014: Removal of stent, sphincterotomy with balloon sweep/stone extraction Colonoscopy Jan 2015: Normal  Family History  Problem Relation Age of Onset   Gallstones Mother    Hypertension Mother    Gallstones Brother    Diabetes Paternal Uncle    Social History   Tobacco Use   Smoking status: Never    Passive exposure: Never   Smokeless tobacco: Never  Vaping Use   Vaping Use: Never used  Substance Use Topics   Alcohol use: No   Drug use: No   Current Outpatient Medications  Medication Sig Dispense Refill    amLODipine (NORVASC) 5 MG tablet Take 1 tablet (5 mg total) by mouth daily. 30 tablet 11   aspirin 81 MG EC tablet Take 1 tablet (81 mg total) by mouth daily. Swallow whole. 90 tablet 3   clopidogrel (PLAVIX) 75 MG tablet Take 1 tablet (75 mg total) by mouth daily. 90 tablet 3   ezetimibe (ZETIA) 10 MG tablet Take 1 tablet (10 mg total) by mouth daily. 90 tablet 3   lisinopril (ZESTRIL) 10 MG tablet Take 1 tablet (10 mg total) by mouth daily. 90 tablet 3   nitroGLYCERIN (NITROSTAT) 0.4 MG SL tablet Place 1 tablet (0.4 mg total) under the tongue every 5 (five) minutes x 3 doses as needed for chest pain. 25 tablet 1   ondansetron (ZOFRAN ODT) 4 MG  disintegrating tablet Take 1 tablet (4 mg total) by mouth every 8 (eight) hours as needed for nausea or vomiting. 10 tablet 0   pantoprazole (PROTONIX) 40 MG tablet Take 1 tablet (40 mg total) by mouth daily. 90 tablet 1   polyethylene glycol powder (GLYCOLAX/MIRALAX) 17 GM/SCOOP powder Take 17 g by mouth 2 (two) times daily as needed. 3350 g 1   rosuvastatin (CRESTOR) 40 MG tablet Take 1 tablet (40 mg total) by mouth daily. 90 tablet 3   No current facility-administered medications for this visit.   No Known Allergies   Review of Systems: All systems reviewed and negative except where noted in HPI.    No results found.  Physical Exam: BP (!) 140/94    Pulse 65    Ht  (1.676 m)    Wt 156 lb (70.8 kg)    BMI 25.18 kg/m  Constitutional: Pleasant,well-developed, Hispanic male in no acute distress.  Accompanied by medical interpretor. HEENT: Normocephalic and atraumatic. Conjunctivae are normal. No scleral icterus. Neck supple.  Cardiovascular: Normal rate, regular rhythm. 3/6 Systolic murmur, best heard LUSB Pulmonary/chest: Effort normal and breath sounds normal. No wheezing, rales or rhonchi. Abdominal: Soft, nondistended, nontender. Bowel sounds active throughout. There are no masses palpable. No hepatomegaly. Extremities: no  edema Neurological: Alert and oriented to person place and time. Skin: Skin is warm and dry. No rashes noted. Psychiatric: Normal mood and affect. Behavior is normal.  CBC    Component Value Date/Time   WBC 6.6 10/24/2020 0900   RBC 5.20 10/24/2020 0900   HGB 15.8 10/24/2020 0900   HGB 17.0 09/20/2017 1249   HCT 46.4 10/24/2020 0900   HCT 50.9 09/20/2017 1249   PLT 241.0 10/24/2020 0900   PLT 264 09/20/2017 1249   MCV 89.2 10/24/2020 0900   MCV 89 09/20/2017 1249   MCH 30.1 08/30/2020 0701   MCHC 34.1 10/24/2020 0900   RDW 14.0 10/24/2020 0900   RDW 14.2 09/20/2017 1249   LYMPHSABS 1.6 10/24/2020 0900   MONOABS 0.4 10/24/2020 0900   EOSABS 0.2 10/24/2020 0900   BASOSABS 0.1 10/24/2020 0900    CMP     Component Value Date/Time   NA 140 10/24/2020 0900   NA 140 09/17/2019 1120   K 3.5 10/24/2020 0900   CL 103 10/24/2020 0900   CO2 29 10/24/2020 0900   GLUCOSE 111 (H) 10/24/2020 0900   BUN 21 10/24/2020 0900   BUN 13 09/17/2019 1120   CREATININE 0.87 10/24/2020 0900   CREATININE 0.91 08/30/2015 1035   CALCIUM 9.3 10/24/2020 0900   PROT 7.5 10/24/2020 0900   PROT 7.5 09/17/2019 1120   ALBUMIN 4.4 10/24/2020 0900   ALBUMIN 4.5 09/17/2019 1120   AST 27 10/24/2020 0900   ALT 57 (H) 10/24/2020 0900   ALKPHOS 100 10/24/2020 0900   BILITOT 0.7 10/24/2020 0900   BILITOT 0.5 09/17/2019 1120   GFRNONAA >60 08/30/2020 0701   GFRAA 113 09/17/2019 1120   Component Ref Range & Units 2 mo ago (10/24/20) 3 mo ago (10/12/20) 3 mo ago (10/12/20) 4 mo ago (09/13/20) 4 mo ago (08/30/20) 4 mo ago (08/26/20) 6 mo ago (07/15/20)  Sodium 135 - 145 mEq/L 140   140  140  139 R  140 R  136 R   Potassium 3.5 - 5.1 mEq/L 3.5   4.1  3.7  3.7 R  3.4 Low  R  3.5 R   Chloride 96 - 112 mEq/L 103   103  101  106 R  102 R  105 R   CO2 19 - 32 mEq/L 29   29  31  24  R  27 R  25 R   Glucose, Bld 70 - 99 mg/dL 111 High    107 High   100 High   111 High  CM  165 High  CM  90 CM   BUN 6 - 23 mg/dL  21   20  16  16  R  19 R  18 R   Creatinine, Ser 0.40 - 1.50 mg/dL 0.87   0.88  0.90  1.00 R  1.39 High  R  0.98 R   Total Bilirubin 0.2 - 1.2 mg/dL 0.7  2.1 High    0.7  1.4 High  R  2.3 High  R    Alkaline Phosphatase 39 - 117 U/L 100  354 High    74  213 High  R  85 R    AST 0 - 37 U/L 27  397 High    27  254 High  R  958 High  R    ALT 0 - 53 U/L 57 High   463 High    59 High   623 High  R  671 High  R    Total Protein 6.0 - 8.3 g/dL 7.5  7.6   7.6  7.2 R  6.9 R    Albumin 3.5 - 5.2 g/dL 4.4  4.2   4.3  3.5 R  4.1 R    GFR >60.00 mL/min 95.72   95.41 CM  94.82 CM      Comment: Calculated using the CKD-EPI Creatinine Equation (2021)  Calcium 8.4 - 10.5 mg/dL 9.3   9.7  9.5  8.8 Low  R  8.9 R  8.7 Low  R   CLINICAL DATA:  Upper abdominal pain with weakness and vomiting.   EXAM: CT ABDOMEN AND PELVIS WITH CONTRAST   TECHNIQUE: Multidetector CT imaging of the abdomen and pelvis was performed using the standard protocol following bolus administration of intravenous contrast.   CONTRAST:  184mL OMNIPAQUE IOHEXOL 350 MG/ML SOLN   COMPARISON:  January 21, 2017   FINDINGS: Lower chest: No acute abnormality.   Hepatobiliary: No focal liver abnormality is seen. Status post cholecystectomy. No biliary dilatation.   Pancreas: Unremarkable. No pancreatic ductal dilatation or surrounding inflammatory changes.   Spleen: Normal in size without focal abnormality.   Adrenals/Urinary Tract: Adrenal glands are unremarkable. Kidneys are normal in size, without renal calculi or hydronephrosis. A 2.9 cm x 2.5 cm exophytic simple cyst is seen along the anterior aspect of the mid left kidney. Multiple smaller cysts are noted within the mid and lower left kidney. The urinary bladder is moderately distended and otherwise normal in appearance.   Stomach/Bowel: Stomach is within normal limits. Appendix appears normal. No evidence of bowel dilatation. The proximal to mid transverse colon is mildly  thickened and poorly distended.   Vascular/Lymphatic: Mild aortic atherosclerosis. 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 No enlarged abdominal or pelvic lymph nodes.   Reproductive: There is mild prostate gland enlargement.   Other: No abdominal wall hernia or abnormality. No abdominopelvic ascites.   Musculoskeletal: No acute or significant osseous findings.   IMPRESSION: 1. Thickened and poorly distended transverse colon. Sequelae associated with mild colitis can not be excluded. 2. Multiple predominant stable simple renal cysts. 3. Evidence  of prior cholecystectomy. 4. Aortic atherosclerosis.   Aortic Atherosclerosis (ICD10-I70.0).     Electronically Signed   By: Virgina Norfolk M.D.   On: 08/27/2020 01:40  CLINICAL DATA:  Abdominal pain.  Elevated liver enzymes.   EXAM: CT ABDOMEN AND PELVIS WITH CONTRAST   TECHNIQUE: Multidetector CT imaging of the abdomen and pelvis was performed using the standard protocol following bolus administration of intravenous contrast.   CONTRAST:  167mL OMNIPAQUE IOHEXOL 300 MG/ML  SOLN   COMPARISON:  CT abdomen pelvis 08/30/2020   FINDINGS: Lower chest: No acute abnormality.   Hepatobiliary: No focal liver abnormality. Status post cholecystectomy. Pneumobilia. No biliary dilatation.   Pancreas: No focal lesion. Normal pancreatic contour. No surrounding inflammatory changes. No main pancreatic ductal dilatation.   Spleen: Normal in size without focal abnormality.   Adrenals/Urinary Tract:   No adrenal nodule bilaterally.   Bilateral kidneys enhance symmetrically. There is a simple renal cyst measuring up to 2.4 cm on the left. There is another fluid density lesion measuring up to 1.7 cm in the left kidney within associated thin septation noted on coronal view. Subcentimeter hypodensities are too small to characterize.   No  hydronephrosis. No hydroureter.   The urinary bladder is unremarkable.   Stomach/Bowel: Stomach is within normal limits. No evidence of bowel wall thickening or dilatation. Appendix appears normal.   Vascular/Lymphatic: No abdominal aorta or iliac aneurysm. Mild atherosclerotic plaque of the aorta and its branches. No abdominal, pelvic, or inguinal lymphadenopathy.   Reproductive: The prostate is enlarged measuring up to 5 cm. Partially visualized right hydrocele.   Other: No intraperitoneal free fluid. No intraperitoneal free gas. No organized fluid collection.   Musculoskeletal: No intraperitoneal free fluid. No intraperitoneal free gas. No organized fluid collection.   IMPRESSION: 1. No acute intra-abdominal or intrapelvic abnormality in a patient status post cholecystectomy with associated pneumobilia. 2. Prostatomegaly. 3. Minimally complex 1.7 cm left renal cyst. 4. Right hydrocele.     Electronically Signed   By: Iven Finn M.D.   On: 10/12/2020 17:44  CLINICAL DATA:  Constipation.   EXAM: CT ABDOMEN AND PELVIS WITH CONTRAST   TECHNIQUE: Multidetector CT imaging of the abdomen and pelvis was performed using the standard protocol following bolus administration of intravenous contrast.   CONTRAST:  134mL OMNIPAQUE IOHEXOL 350 MG/ML SOLN   COMPARISON:  August 27, 2020   FINDINGS: Lower chest: No acute abnormality.   Hepatobiliary: No focal liver abnormality is seen. Status post cholecystectomy. No biliary dilatation.   Pancreas: Unremarkable. No pancreatic ductal dilatation or surrounding inflammatory changes.   Spleen: Normal in size without focal abnormality.   Adrenals/Urinary Tract: Adrenal glands are unremarkable. Kidneys are normal in size, without renal calculi or hydronephrosis. A 2.8 cm simple cyst is seen within the anterior aspect of the mid left kidney. Additional smaller simple cysts are seen within the mid and lower left kidney.  Bladder is unremarkable.   Stomach/Bowel: Stomach is within normal limits. Appendix appears normal. Stool is seen throughout the large bowel. No evidence of bowel wall thickening, distention, or inflammatory changes.   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 No additional significant vascular findings are present. No enlarged abdominal or pelvic lymph nodes.   Reproductive: Prostate gland is moderately enlarged.   Other: No abdominal wall hernia or abnormality. No abdominopelvic ascites.   Musculoskeletal: No acute or significant osseous findings.   IMPRESSION: 1. Evidence of prior cholecystectomy. 2. Large stool burden without evidence of  bowel obstruction.     Electronically Signed   By: Virgina Norfolk M.D.   On: 08/30/2020 17:39      ASSESSMENT AND PLAN: 56 year old male with a history of cholecystectomy complicated by bile leak, treated with biliary stent, followed by subsequent stent removal, sphincterotomy and balloon sweep, with multiple chronic upper GI symptoms since then, to include epigastric pain, bloating, nausea, heartburn.  He was noted to have a mild ALT elevation starting in 2021, but then had a significant bump in his liver enzymes in August 2022 in the setting of a COVID infection.  His enzymes nearly normalized, but then bumped again in the setting of worsened abdominal pain in Oct 2022, with significant pneumobilia on CT (Cts in Aug showed very small foci of pneumobilia on my review).  With his history of sphincterotomy, pneumobilia is not necessarily pathologic, but it is unclear why his liver enzymes have bounced around.  Initially, it was felt that COVID was the cause for his liver enzyme elevation, but this would not explain the bump in enzymes in October.  There was no obvious evidence of biliary obstruction on imaging.  His GI symptoms seem to have been chronic for 8+ years, so it seems unlikely that these  symptoms are related to chronic, episodic biliary obstruction which resolves spontaneously.  Sphincter of Oddi dysfunction seems extremely unlikely in the setting of prior sphincterotomy.   I would like to get an ultrasound of the liver to look for evidence of chronic liver disease, as well as to evaluate for any microlithiasis/sludge that may be causing transient biliary obstruction.  Will evaluate for causes of chronic liver disease (viral, autoimmune, metabolic/genetic).  Will perform EGD to assess for intraluminal causes of epigastric pain (PUD, H. Pylori).  Check H. Pylori IgG.  May consider EUS to further evaluate for sludge/microlithiasis if work up unrevealing.   Epigastric pain/bloating - EGD - H pylori IgG - Hold Plavix 5 days prior to EGD.  Will sent letter to cardiologist to ensure this is a reasonable risk.  Elevated liver enzymes - RUQUS - Chronic liver disease w/u  The details, risks (including bleeding, perforation, infection, missed lesions, medication reactions and possible hospitalization or surgery if complications occur), benefits, and alternatives to EGD with possible biopsy and possible dilation were discussed with the patient and he consents to proceed.   Karas Pickerill E. Candis Schatz, MD [facility]  CC:  Warren Lacy, PA*

## 2021-01-10 NOTE — Telephone Encounter (Signed)
° °  Primary Cardiologist: Bryan Lemma, MD  Chart reviewed as part of pre-operative protocol coverage. Given past medical history and time since last visit, based on ACC/AHA guidelines, Ethan Silva would be at acceptable risk for the planned procedure without further cardiovascular testing.   His Plavix may be held for 5 days prior to his procedure.  Please resume as soon as hemostasis is achieved.  I will route this recommendation to the requesting party via Epic fax function and remove from pre-op pool.  Please call with questions.  Thomasene Ripple. Josalin Carneiro NP-C    01/10/2021, 11:19 AM [facility] [address] Office [phone] Fax [phone]

## 2021-01-10 NOTE — Patient Instructions (Signed)
If you are age 58 or older, your body mass index should be between 23-30. Your Body mass index is 25.18 kg/m. If this is out of the aforementioned range listed, please consider follow up with your Primary Care Provider.  If you are age 22 or younger, your body mass index should be between 19-25. Your Body mass index is 25.18 kg/m. If this is out of the aformentioned range listed, please consider follow up with your Primary Care Provider.   Your provider has requested that you go to the basement level for lab work before leaving today. Press "B" on the elevator. The lab is located at the first door on the left as you exit the elevator.  You have been scheduled for an endoscopy. Please follow written instructions given to you at your visit today. If you use inhalers (even only as needed), please bring them with you on the day of your procedure.  You have been scheduled for an abdominal ultrasound at [facility] (1st floor of hospital) on 01/19/21 at 9am. Please arrive 15 minutes prior to your appointment for registration. Make certain not to have anything to eat or drink 6 hours prior to your appointment. Should you need to reschedule your appointment, please contact radiology at [phone]. This test typically takes about 30 minutes to perform.  The [facility] providers would like to encourage you to use [facility] to communicate with providers for non-urgent requests or questions.  Due to long hold times on the telephone, sending your provider a message by [facility] may be a faster and more efficient way to get a response.  Please allow 48 business hours for a response.  Please remember that this is for non-urgent requests.   It was a pleasure to see you today!  Thank you for trusting me with your gastrointestinal care!    Scott E.Tomasa Rand, MD

## 2021-01-11 NOTE — Telephone Encounter (Signed)
Called Patient Via interpreter and let him know to hold Plavix 5 days prior to his procedure. Patient states understanding and had no question at the end of call.

## 2021-01-15 ENCOUNTER — Encounter: Payer: Self-pay | Admitting: Gastroenterology

## 2021-01-17 LAB — CERULOPLASMIN: Ceruloplasmin: 24 mg/dL (ref 18–36)

## 2021-01-17 LAB — HEPATITIS B SURFACE ANTIGEN: Hepatitis B Surface Ag: NONREACTIVE

## 2021-01-17 LAB — IGA: Immunoglobulin A: 553 mg/dL — ABNORMAL HIGH (ref 47–310)

## 2021-01-17 LAB — HEPATITIS C ANTIBODY
Hepatitis C Ab: NONREACTIVE
SIGNAL TO CUT-OFF: 0.2 (ref <1.00)

## 2021-01-17 LAB — MITOCHONDRIAL ANTIBODIES: Mitochondrial M2 Ab, IgG: 25.2 U — ABNORMAL HIGH (ref <=20.0)

## 2021-01-17 LAB — ALPHA-1-ANTITRYPSIN: A-1 Antitrypsin, Ser: 180 mg/dL (ref 83–199)

## 2021-01-17 LAB — HEPATITIS B SURFACE ANTIBODY,QUALITATIVE: Hep B S Ab: NONREACTIVE

## 2021-01-17 LAB — ANTI-SMOOTH MUSCLE ANTIBODY, IGG: Actin (Smooth Muscle) Antibody (IGG): <20 U (ref <20)

## 2021-01-17 LAB — TISSUE TRANSGLUTAMINASE, IGA: (tTG) Ab, IgA: <1 U/mL

## 2021-01-17 LAB — IGG: IgG (Immunoglobin G), Serum: 919 mg/dL (ref 600–1640)

## 2021-01-19 ENCOUNTER — Ambulatory Visit (HOSPITAL_COMMUNITY)
Admission: RE | Admit: 2021-01-19 | Discharge: 2021-01-19 | Disposition: A | Discharge status: Home / Self Care | Payer: 59 | Source: Ambulatory Visit | Attending: Gastroenterology | Admitting: Gastroenterology

## 2021-01-19 ENCOUNTER — Other Ambulatory Visit: Payer: Self-pay

## 2021-01-19 DIAGNOSIS — R748 Abnormal levels of other serum enzymes: Secondary | ICD-10-CM | POA: Diagnosis present

## 2021-01-19 DIAGNOSIS — R140 Abdominal distension (gaseous): Secondary | ICD-10-CM | POA: Diagnosis present

## 2021-01-19 DIAGNOSIS — R1013 Epigastric pain: Secondary | ICD-10-CM | POA: Insufficient documentation

## 2021-01-20 NOTE — Progress Notes (Signed)
Bonita Quin, please relay the following to Mr. Highfill (will need to use interpretor)  Overall, your labs looked very good.  Your liver enzymes have normalized.  The tests for causes of chronic liver disease were all negative.  One antibody test was slightly elevated (anti-mitochondrial antibody).  This antibody is associated with a disease called primary biliary cirrhosis.  Because the titer level was so low, it is unlikely that this represents a positive diagnosis.  This condition does not typically cause abdominal pain.   Your ultrasound also looked good. There was no evidence of fatty change of the liver.  The common bile duct was dilated, but this can often be a result of having your gallbladder removed. I recommend we continue to recheck your liver enzymes periodically, and then consider a liver biopsy if they continue to be elevated. I recommend that you follow up with me in the clinic after your EGD to discuss these results further.  Dr. Tomasa Rand

## 2021-01-24 ENCOUNTER — Ambulatory Visit (AMBULATORY_SURGERY_CENTER): Payer: 59 | Admitting: Gastroenterology

## 2021-01-24 ENCOUNTER — Other Ambulatory Visit: Payer: Self-pay

## 2021-01-24 ENCOUNTER — Encounter: Payer: Self-pay | Admitting: Gastroenterology

## 2021-01-24 VITALS — BP 108/82 | HR 58 | Temp 98.1°F | Resp 19 | Ht 66.0 in | Wt 156.0 lb

## 2021-01-24 DIAGNOSIS — R140 Abdominal distension (gaseous): Secondary | ICD-10-CM

## 2021-01-24 DIAGNOSIS — K2970 Gastritis, unspecified, without bleeding: Secondary | ICD-10-CM | POA: Diagnosis not present

## 2021-01-24 DIAGNOSIS — R1013 Epigastric pain: Secondary | ICD-10-CM | POA: Diagnosis not present

## 2021-01-24 DIAGNOSIS — K2950 Unspecified chronic gastritis without bleeding: Secondary | ICD-10-CM | POA: Diagnosis not present

## 2021-01-24 MED ORDER — SODIUM CHLORIDE 0.9 % IV SOLN: 500.0000 mL | Freq: Once | INTRAVENOUS | Status: DC

## 2021-01-24 NOTE — Progress Notes (Signed)
Called to room to assist during endoscopic procedure.  Patient ID and intended procedure confirmed with present staff. Received instructions for my participation in the procedure from the performing physician.  

## 2021-01-24 NOTE — Progress Notes (Signed)
VS-CW  Pt's states no medical or surgical changes since previsit or office visit.  

## 2021-01-24 NOTE — Progress Notes (Signed)
Report to PACU, RN, vss, BBS= Clear.  

## 2021-01-24 NOTE — Progress Notes (Signed)
History and Physical Interval Note:  01/24/2021 7:53 AM  Ethan Silva  has presented today for endoscopic procedure(s), with the diagnosis of  Encounter Diagnoses  Name Primary?   Bloating Yes   Abdominal pain, epigastric   .  The various methods of evaluation and treatment have been discussed with the patient and/or family. After consideration of risks, benefits and other options for treatment, the patient has consented to  the endoscopic procedure(s).   The patient's history has been reviewed, patient examined, no change in status, stable for endoscopic procedure(s).  I have reviewed the patient's chart and labs.  Questions were answered to the patient's satisfaction.    Of note, the patient took Plavix yesterday.  We can still safely perform biopsies but will not be able to remove any large polyps or dilate if needed.  Dalissa Lovin E. Tomasa Rand, MD [facility]

## 2021-01-24 NOTE — Patient Instructions (Signed)
Discharge instructions given. Biopsies taken. Restart Plavix today. Resume previous medications. Office will schedule return office visit for 2-4 weeks to discuss biopsy results. Avoid NSAIDS. YOU HAD AN ENDOSCOPIC PROCEDURE TODAY AT THE [facility]:   Refer to the procedure report that was given to you for any specific questions about what was found during the examination.  If the procedure report does not answer your questions, please call your gastroenterologist to clarify.  If you requested that your care partner not be given the details of your procedure findings, then the procedure report has been included in a sealed envelope for you to review at your convenience later.  YOU SHOULD EXPECT: Some feelings of bloating in the abdomen. Passage of more gas than usual.  Walking can help get rid of the air that was put into your GI tract during the procedure and reduce the bloating. If you had a lower endoscopy (such as a colonoscopy or flexible sigmoidoscopy) you may notice spotting of blood in your stool or on the toilet paper. If you underwent a bowel prep for your procedure, you may not have a normal bowel movement for a few days.  Please Note:  You might notice some irritation and congestion in your nose or some drainage.  This is from the oxygen used during your procedure.  There is no need for concern and it should clear up in a day or so.  SYMPTOMS TO REPORT IMMEDIATELY:   Following upper endoscopy (EGD)  Vomiting of blood or coffee ground material  New chest pain or pain under the shoulder blades  Painful or persistently difficult swallowing  New shortness of breath  Fever of 100F or higher  Black, tarry-looking stools  For urgent or emergent issues, a gastroenterologist can be reached at any hour by calling [phone]. Do not use MyChart messaging for urgent concerns.    DIET:  We do recommend a small meal at first, but then you may proceed to your regular diet.   Drink plenty of fluids but you should avoid alcoholic beverages for 24 hours.  ACTIVITY:  You should plan to take it easy for the rest of today and you should NOT DRIVE or use heavy machinery until tomorrow (because of the sedation medicines used during the test).    FOLLOW UP: Our staff will call the number listed on your records 48-72 hours following your procedure to check on you and address any questions or concerns that you may have regarding the information given to you following your procedure. If we do not reach you, we will leave a message.  We will attempt to reach you two times.  During this call, we will ask if you have developed any symptoms of COVID 19. If you develop any symptoms (ie: fever, flu-like symptoms, shortness of breath, cough etc.) before then, please call [phone].  If you test positive for Covid 19 in the 2 weeks post procedure, please call and report this information to [place].    If any biopsies were taken you will be contacted by phone or by letter within the next 1-3 weeks.  Please call us at [phone] if you have not heard about the biopsies in 3 weeks.    SIGNATURES/CONFIDENTIALITY: You and/or your care partner have signed paperwork which will be entered into your electronic medical record.  These signatures attest to the fact that that the information above on your After Visit Summary has been reviewed and is understood.  Full responsibility of  the confidentiality of this discharge information lies with you and/or your care-partner.

## 2021-01-24 NOTE — Op Note (Signed)
[facility] Patient Name: Ethan Silva Procedure Date: 01/24/2021 7:37 AM MRN: [id number] Endoscopist: Nicki Reaper E. Candis Schatz , MD Age: 56 Referring MD:  Date of Birth: 07-24-1965 Gender: Male Account #: [id number] Procedure:                Upper GI endoscopy Indications:              Epigastric abdominal pain, Abdominal bloating Medicines:                Monitored Anesthesia Care Procedure:                Pre-Anesthesia Assessment:                           - Prior to the procedure, a History and Physical                            was performed, and patient medications and                            allergies were reviewed. The patient's tolerance of                            previous anesthesia was also reviewed. The risks                            and benefits of the procedure and the sedation                            options and risks were discussed with the patient.                            All questions were answered, and informed consent                            was obtained. Prior Anticoagulants: The patient has                            taken Plavix (clopidogrel), last dose was 1 day                            prior to procedure. ASA Grade Assessment: III - A                            patient with severe systemic disease. After                            reviewing the risks and benefits, the patient was                            deemed in satisfactory condition to undergo the                            procedure.  After obtaining informed consent, the endoscope was                            passed under direct vision. Throughout the                            procedure, the patient's blood pressure, pulse, and                            oxygen saturations were monitored continuously. The                            Endoscope was introduced through the mouth, and                            advanced to the third part of  duodenum. The upper                            GI endoscopy was accomplished without difficulty.                            The patient tolerated the procedure well. Scope In: Scope Out: Findings:                 The examined portions of the nasopharynx,                            oropharynx and larynx were normal.                           The gastroesophageal flap valve was visualized                            endoscopically and classified as Hill Grade III                            (minimal fold, loose to endoscope, hiatal hernia                            likely).                           The examined esophagus was normal.                           A few localized diminutive erosions with no                            bleeding and no stigmata of recent bleeding were                            found in the gastric antrum. Biopsies were taken                            with a cold forceps for Helicobacter pylori  testing. Estimated blood loss was minimal.                           The exam of the stomach was otherwise normal.                           The examined duodenum was normal. Biopsies for                            histology were taken with a cold forceps for                            evaluation of celiac disease. Estimated blood loss                            was minimal. Complications:            No immediate complications. Estimated Blood Loss:     Estimated blood loss was minimal. Impression:               - The examined portions of the nasopharynx,                            oropharynx and larynx were normal.                           - Gastroesophageal flap valve classified as Hill                            Grade III (minimal fold, loose to endoscope, hiatal                            hernia likely).                           - Normal esophagus.                           - Erosive gastropathy with no bleeding and no                             stigmata of recent bleeding. Biopsied.                           - Normal examined duodenum. Biopsied.                           - No obvious abnormalities to explain patient's                            symptoms. Recommendation:           - Patient has a contact number available for                            emergencies. The signs and symptoms of potential  delayed complications were discussed with the                            patient. Return to normal activities tomorrow.                            Written discharge instructions were provided to the                            patient.                           - Resume previous diet.                           - Continue present medications.                           - Await pathology results.                           - Return to my office in 2-4 weeks to discuss                            biopsy results and further evaluation of chronic GI                            symptoms.                           - Ok to restart Plavix today.                           - Avoid NSAIDs Shakeena Kafer E. Candis Schatz, MD 01/24/2021 8:21:14 AM This report has been signed electronically.

## 2021-01-26 ENCOUNTER — Telehealth: Payer: Self-pay

## 2021-01-26 NOTE — Telephone Encounter (Signed)
°  Follow up Call-  Call back number 01/24/2021  Post procedure Call Back phone  # [phone]  Permission to leave phone message Yes  Some recent data might be hidden     Patient questions:  Do you have a fever, pain , or abdominal swelling? No. Pain Score  0 *  Have you tolerated food without any problems? Yes.    Have you been able to return to your normal activities? Yes.    Do you have any questions about your discharge instructions: Diet   No. Medications  No. Follow up visit  No.  Do you have questions or concerns about your Care? No.  Actions: * If pain score is 4 or above: No action needed, pain <4.

## 2021-01-31 ENCOUNTER — Encounter: Payer: Self-pay | Admitting: Gastroenterology

## 2021-01-31 ENCOUNTER — Telehealth: Payer: Self-pay

## 2021-01-31 NOTE — Progress Notes (Signed)
Cardiology Office Note:    Date:  02/01/2021   ID:  [name], [name] April 06, 1965, MRN [id number]  PCP:  Horald Pollen, [place] Cardiologist: Glenetta Hew, MD   Reason for visit: 77-month follow-up  History of Present Illness:    Ethan Silva is a 56 y.o. male with a hx of small vessel CAD, hypertension, hyperlipidemia and prediabetes.  He was admitted to [facility] in July 2022 with chest pain and hypertension.  Found to have NSTEMI.  Cardiac cath showed potential culprit is a very small caliber branch of the OM (not amenable to PCI).  He was medically managed.  Normal EF.  Dr. Debara Pickett believed the patient's elevated troponin and angina was related to HTN in setting of known vessel disease.    I last saw him in November 2022.  His blood pressure was 150/98.  We started him on lisinopril 10 mg daily.  Video interpreter used for this appointment.  - Today, he feels well overall.  He states his BP is <150 at home.  He sometimes gets lightheaded when he gets up but this is very brief.  No syncope.  He states he has mild chest discomfort which is better with burping.  He is undergoing a GI work-up.  He states he gets shortness of breath when it is very hot outside.  He denies PND, orthopnea, lower extremity edema and palpitations.  He mentions some discomfort in his left leg including calf, worse with exertion.  He is not sure if this is related to left knee arthritis.  He states since his heart attack, he has improved his diet with better portion control.   Recent CV studies:  TTE 07/14/2020 (NSTEMI/Accelerated Hypertension): EF 55 to 60%.  No RWM A.  Very mild Aortic Stenosis-mean gradient 39mmHg. Cardiac Cath 07/14/2020: ? Culprit Lesion ~ 80% ostial small OM3 (too small & ostial for PCI). ~30% mid LM (at a bend).  LAD with D1 & D2 - normal, RCA minimal luminal irregularities ~ 30%. PDA    Past Medical History:  Diagnosis Date   Aortic  stenosis, mild 07/2012   Very mild aortic stenosis noted on echo-mean gradient 11 mmHg.   Bile duct leak    CAD in native artery 07/14/2020   Small branch of OM 3 with 80% ostial stenosis.  Too small for PCI.  Otherwise questionable 30% LM and 30% PDA.   Chronic kidney disease 2017   left kidney stones   Headache(784.0)    Hx: of when BP is elevated   Hyperlipidemia    Hypertension    Non-STEMI (non-ST elevated myocardial infarction) ([facility]) 07/13/2020   Admitted with hypertension and chest pain.  Cardiac cath showed small branch of an OM3 with 85% ostial stenosis not amenable to PCI.  No other significant disease noted.  Normal EF on echo.  Medical management.   Numbness and tingling in hands    Hx: of   Prediabetes     Past Surgical History:  Procedure Laterality Date   CHOLECYSTECTOMY N/A 07/11/2012   Procedure: LAPAROSCOPIC CHOLECYSTECTOMY WITH INTRAOPERATIVE CHOLANGIOGRAM;  Surgeon: Ralene Ok, MD;  Location: [facility];  Service: General;  Laterality: N/A;   COLONOSCOPY     ERCP  07/16/2012   ERCP N/A 07/16/2012   Procedure: ENDOSCOPIC RETROGRADE CHOLANGIOPANCREATOGRAPHY (ERCP);  Surgeon: Inda Castle, MD;  Location: [facility];  Service: Gastroenterology;  Laterality: N/A;   ERCP N/A 10/06/2012   Procedure: ENDOSCOPIC RETROGRADE CHOLANGIOPANCREATOGRAPHY (ERCP);  Surgeon:  Inda Castle, MD;  Location: [facility];  Service: Endoscopy;  Laterality: N/A;   HAND SURGERY     LEFT HEART CATH AND CORONARY ANGIOGRAPHY N/A 07/14/2020   Procedure: LEFT HEART CATH AND CORONARY ANGIOGRAPHY;  Surgeon: Belva Crome, MD;  Location: [facility];  Service: Cardiovascular; ? Culprit Lesion ~ 80% ostial small OM3 (too small & ostial for PCI). ~30% mid LM (at a bend).  LAD with D1 & D2 - normal, RCA minimal luminal irregularities ~ 30%. PDA   TRANSTHORACIC ECHOCARDIOGRAM  07/14/2020   (NSTEMI/Accelerated Hypertension): EF 55 to 60%.  No RWM A.  Very mild Aortic Stenosis-mean gradient 11 to  mmHg.    Current Medications: Current Meds  Medication Sig   acetaminophen (TYLENOL) 325 MG tablet Take 650 mg by mouth every 6 (six) hours as needed.   aspirin 81 MG EC tablet Take 1 tablet (81 mg total) by mouth daily. Swallow whole.   nitroGLYCERIN (NITROSTAT) 0.4 MG SL tablet Place 1 tablet (0.4 mg total) under the tongue every 5 (five) minutes x 3 doses as needed for chest pain.   ondansetron (ZOFRAN ODT) 4 MG disintegrating tablet Take 1 tablet (4 mg total) by mouth every 8 (eight) hours as needed for nausea or vomiting.   pantoprazole (PROTONIX) 40 MG tablet Take 1 tablet (40 mg total) by mouth daily.   polyethylene glycol powder (GLYCOLAX/MIRALAX) 17 GM/SCOOP powder Take 17 g by mouth 2 (two) times daily as needed.   [DISCONTINUED] amLODipine (NORVASC) 5 MG tablet Take 1 tablet (5 mg total) by mouth daily.   [DISCONTINUED] clopidogrel (PLAVIX) 75 MG tablet Take 1 tablet (75 mg total) by mouth daily.   [DISCONTINUED] ezetimibe (ZETIA) 10 MG tablet Take 1 tablet (10 mg total) by mouth daily.   [DISCONTINUED] lisinopril (ZESTRIL) 10 MG tablet Take 1 tablet (10 mg total) by mouth daily.   [DISCONTINUED] rosuvastatin (CRESTOR) 40 MG tablet Take 1 tablet (40 mg total) by mouth daily.     Allergies:   Patient has no known allergies.   Social History   Socioeconomic History   Marital status: Married    Spouse name: Alyson Locket   Number of children: 3   Years of education: 6th grade   Highest education level: Not on file  Occupational History   Occupation: Painting    Employer: [employer], [place], [place]    Comment: Mostly indoor painting  Tobacco Use   Smoking status: Never    Passive exposure: Never   Smokeless tobacco: Never  Vaping Use   Vaping Use: Never used  Substance and Sexual Activity   Alcohol use: No   Drug use: No   Sexual activity: Yes    Partners: Female  Other Topics Concern   Not on file  Social History Narrative   Originally from  [place], [place]. [place] to the [place] in [place].   Lives with his wife and their youngest son   His daughter lives in [place], [place] with her husband.  Second son is now moved out of the house as well.   He gets a decent amount of exercise walking around at work, but does not do routine exercise.  Does not drink or smoke   Social Determinants of Health   Financial Resource Strain: Not on file  Food Insecurity: Not on file  Transportation Needs: Not on file  Physical Activity: Not on file  Stress: Not on file  Social Connections: Not on file     Family  History: The patient's family history includes Diabetes in his paternal uncle; Gallstones in his brother and mother; Hypertension in his mother.  ROS:   Please see the history of present illness.     EKGs/Labs/Other Studies Reviewed:    EKG:  The ekg ordered today demonstrates normal sinus rhythm with PAC.  Heart rate 61, PR interval 118 ms, QRS duration 104 ms.  Recent Labs: 10/24/2020: BUN 21; Creatinine, Ser 0.87; Hemoglobin 15.8; Platelets 241.0; Potassium 3.5; Sodium 140 01/10/2021: ALT 41   Recent Lipid Panel Lab Results  Component Value Date/Time   CHOL 178 11/21/2020 11:11 AM   TRIG 107 11/21/2020 11:11 AM   HDL 49 11/21/2020 11:11 AM   LDLCALC 110 (H) 11/21/2020 11:11 AM    Physical Exam:    VS:  BP 123/84    Pulse 61    Ht 5\' 3"  (1.6 m)    Wt 162 lb (73.5 kg)    SpO2 98%    BMI 28.70 kg/m    No data found.  Wt Readings from Last 3 Encounters:  02/01/21 162 lb (73.5 kg)  01/24/21 156 lb (70.8 kg)  01/10/21 156 lb (70.8 kg)     GEN:  Well nourished, well developed in no acute distress HEENT: Normal NECK: No JVD; No carotid bruits CARDIAC: RRR, no murmurs, rubs, gallops RESPIRATORY:  Clear to auscultation without rales, wheezing or rhonchi  ABDOMEN: Soft, non-tender, non-distended MUSCULOSKELETAL: No edema.  Decreased pedal pulses on the left.  No wounds or discoloration.   SKIN: Warm and dry NEUROLOGIC:  Alert and  oriented PSYCHIATRIC:  Normal affect     ASSESSMENT AND PLAN   CAD, stable with no angina -Left heart cath 07/14/2020: ? Culprit Lesion ~ 80% ostial small OM3 (too small & ostial for PCI). ~30% mid LM (at a bend).  LAD with D1 & D2 - normal, RCA minimal luminal irregularities ~ 30%. PDA -Continue DAPT x1 year.  Continue beta-blocker, ACE and statin therapy.   Hypertension, well controlled -Refill medications. -Goal BP is <130/80.  Recommend DASH diet (high in vegetables, fruits, low-fat dairy products, whole grains, poultry, fish, and nuts and low in sweets, sugar-sweetened beverages, and red meats), salt restriction and increase physical activity.   Hyperlipidemia -LDL 115 in 07/2020.  Lipitor changed to Crestor & Zetia added.   -Check fasting lipids today.  Claudication -Check ABIs.  Continue antiplatelet and statin therapy.  Disposition - Follow-up in July with Dr. Ellyn Hack - likely can stop Plavix (at 1 year post NSTEMI).     Medication Adjustments/Labs and Tests Ordered: Current medicines are reviewed at length with the patient today.  Concerns regarding medicines are outlined above.  Orders Placed This Encounter  Procedures   Lipid panel   EKG 12-Lead   VAS [place] ABI WITH/WO TBI   Meds ordered this encounter  Medications   amLODipine (NORVASC) 5 MG tablet    Sig: Take 1 tablet (5 mg total) by mouth daily.    Dispense:  30 tablet    Refill:  11   clopidogrel (PLAVIX) 75 MG tablet    Sig: Take 1 tablet (75 mg total) by mouth daily.    Dispense:  90 tablet    Refill:  3   ezetimibe (ZETIA) 10 MG tablet    Sig: Take 1 tablet (10 mg total) by mouth daily.    Dispense:  90 tablet    Refill:  3   lisinopril (ZESTRIL) 10 MG tablet    Sig: Take 1  tablet (10 mg total) by mouth daily.    Dispense:  90 tablet    Refill:  3   rosuvastatin (CRESTOR) 40 MG tablet    Sig: Take 1 tablet (40 mg total) by mouth daily.    Dispense:  90 tablet    Refill:  3    Patient Instructions   Medication Instructions:  No Changes *If you need a refill on your cardiac medications before your next appointment, please call your pharmacy*   Lab Work: Lipid Panel If you have labs (blood work) drawn today and your tests are completely normal, you will receive your results only by: [place] (if you have MyChart) OR A paper copy in the mail If you have any lab test that is abnormal or we need to change your treatment, we will call you to review the results.   Testing/Procedures: [place], [address]. Your physician has requested that you have an ankle brachial index (ABI). During this test an ultrasound and blood pressure cuff are used to evaluate the arteries that supply the arms and legs with blood. Allow thirty minutes for this exam. There are no restrictions or special instructions.    Follow-Up: At [facility], you and your health needs are our priority.  As part of our continuing mission to provide you with exceptional heart care, we have created designated Provider Care Teams.  These Care Teams include your primary Cardiologist (physician) and Advanced Practice Providers (APPs -  Physician Assistants and Nurse Practitioners) who all work together to provide you with the care you need, when you need it.  We recommend signing up for the patient portal called "MyChart".  Sign up information is provided on this After Visit Summary.  MyChart is used to connect with patients for Virtual Visits (Telemedicine).  Patients are able to view lab/test results, encounter notes, upcoming appointments, etc.  Non-urgent messages can be sent to your provider as well.   To learn more about what you can do with MyChart, go to [email or web address].    Your next appointment:   7 month(s)  The format for your next appointment:   In Person  Provider:   Glenetta Hew, MD        Signed, Warren Lacy, PA-C  02/01/2021 11:09 AM    [facility]

## 2021-01-31 NOTE — Telephone Encounter (Signed)
-----   Message from Rachael Fee, MD sent at 01/31/2021  1:56 PM EST ----- EUS +/- ERCP next available.  Given the pretty real and definitely transient LFT elevations I expect I'd go ahead with ERCP even if no obvious stones noted.  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    ----- Message ----- From: Lemar Lofty., MD Sent: 01/31/2021   1:30 PM EST To: Rachael Fee, MD, Loretha Stapler, RN, #  SEC, I think that sounds reasonable.  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  So at least considering an ERCP with sphincteroplasty could be considered in a quick cleanout.  Would expect that he would have had more bile duct dilation.  As well, he does not meet criteria for functional disorder of the sphincter (previously SOD) since the bile duct is not dilated.  His pancreas enzymes have not dilated either.  Unless the wire ends up going into the pancreas, I would not normally just do a pancreatic sphincterotomy.  Talk with him and then let us know and certainly he can be set up for a double procedure to make sure nothing else is being missed.  As well, if no contraindication may be getting an MRI/MRCP would be helpful. Keep [place] up-to-date and then Alexia Freestone can work on scheduling as necessary. Thanks. GM ----- Message ----- From: Jenel Lucks, MD Sent: 01/31/2021   1:04 PM EST To: Rachael Fee, MD, Loretha Stapler, RN, #  DJ/GM,  I spoke to DJ about this patient this morning.  56 y/o m w/ hx of CCY c/b bile leak in 2014 (ERCP by Arlyce Dice) with chronic vague GI symptoms (bloating predominant), but with a history of ED presentations showing significant transient liver enzyme elevation (mixed pattern) and multiple CT showing a CBD 10+ mm, and one CT with significant pneumobilia (Oct).   LAEs normalize in between the ED visits.  In  August he was diagnosed with COVID and the LAE elevation was attributed to that, but in October his enzymes were again elevated in the absence of other potential etiologies. DJ was thinking scheduling for EUS +/- ERCP  I am seeing him in clinic for follow up Feb 15th.    Thanks all,  [place]

## 2021-02-01 ENCOUNTER — Encounter: Payer: Self-pay | Admitting: Physician Assistant

## 2021-02-01 ENCOUNTER — Other Ambulatory Visit: Payer: Self-pay

## 2021-02-01 ENCOUNTER — Ambulatory Visit (INDEPENDENT_AMBULATORY_CARE_PROVIDER_SITE_OTHER): Payer: 59 | Admitting: Physician Assistant

## 2021-02-01 ENCOUNTER — Telehealth: Payer: Self-pay

## 2021-02-01 VITALS — BP 123/84 | HR 61 | Ht 63.0 in | Wt 162.0 lb

## 2021-02-01 DIAGNOSIS — I739 Peripheral vascular disease, unspecified: Secondary | ICD-10-CM | POA: Diagnosis not present

## 2021-02-01 DIAGNOSIS — I25119 Atherosclerotic heart disease of native coronary artery with unspecified angina pectoris: Secondary | ICD-10-CM | POA: Diagnosis not present

## 2021-02-01 DIAGNOSIS — R748 Abnormal levels of other serum enzymes: Secondary | ICD-10-CM

## 2021-02-01 DIAGNOSIS — I10 Essential (primary) hypertension: Secondary | ICD-10-CM | POA: Diagnosis not present

## 2021-02-01 DIAGNOSIS — R1013 Epigastric pain: Secondary | ICD-10-CM

## 2021-02-01 DIAGNOSIS — R140 Abdominal distension (gaseous): Secondary | ICD-10-CM

## 2021-02-01 DIAGNOSIS — E785 Hyperlipidemia, unspecified: Secondary | ICD-10-CM

## 2021-02-01 LAB — LIPID PANEL
Chol/HDL Ratio: 2.8 ratio (ref 0.0–5.0)
Cholesterol, Total: 151 mg/dL (ref 100–199)
HDL: 53 mg/dL (ref >39)
LDL Chol Calc (NIH): 80 mg/dL (ref 0–99)
Triglycerides: 98 mg/dL (ref 0–149)
VLDL Cholesterol Cal: 18 mg/dL (ref 5–40)

## 2021-02-01 MED ORDER — ROSUVASTATIN CALCIUM 40 MG PO TABS: 40.0000 mg | ORAL_TABLET | Freq: Every day | ORAL | 3 refills | Status: DC

## 2021-02-01 MED ORDER — AMLODIPINE BESYLATE 5 MG PO TABS: 5.0000 mg | ORAL_TABLET | Freq: Every day | ORAL | 11 refills | Status: DC

## 2021-02-01 MED ORDER — CLOPIDOGREL BISULFATE 75 MG PO TABS: 75.0000 mg | ORAL_TABLET | Freq: Every day | ORAL | 3 refills | Status: DC

## 2021-02-01 MED ORDER — LISINOPRIL 10 MG PO TABS: 10.0000 mg | ORAL_TABLET | Freq: Every day | ORAL | 3 refills | Status: DC

## 2021-02-01 MED ORDER — EZETIMIBE 10 MG PO TABS: 10.0000 mg | ORAL_TABLET | Freq: Every day | ORAL | 3 refills | Status: DC

## 2021-02-01 NOTE — Telephone Encounter (Signed)
° ° °

## 2021-02-01 NOTE — Telephone Encounter (Signed)
Noted the pt will be called with the ok to hold Plavix as recommended.

## 2021-02-01 NOTE — Telephone Encounter (Signed)
The pt has been scheduled for EUS ERCP with DJ at [facility] on 03/16/21 at 945 am.  Pt on plavix

## 2021-02-01 NOTE — Telephone Encounter (Signed)
Left message on machine to call back using interpreter Anti coag letter sent to cardiology see separate note 02/01/21

## 2021-02-01 NOTE — Telephone Encounter (Signed)
[facility] Pre-operative Risk Assessment     Request for surgical clearance:     Endoscopy Procedure  What type of surgery is being performed?     EUS ERCP   When is this surgery scheduled?     03/16/21  What type of clearance is required ?   Pharmacy  Are there any medications that need to be held prior to surgery and how long? Plavix   Practice name and name of physician performing surgery?      [facility]  What is your office phone and fax number?      Phone- [phone]  [phone]  Anesthesia type (None, local, MAC, general) ?       MAC

## 2021-02-01 NOTE — Telephone Encounter (Signed)
-----   Message from Jenel Lucks, MD sent at 01/31/2021  5:57 PM EST ----- Ok thanks so much for the input.  For my own edification, what's a dilated CBD s/p cholecystectomy?  It was 10 mm on the [place] and on my measurements on the CT, it looked to be about 10 as well. Will talk to him in clinic on the 15th and get an MRCP ordered.  I will go over the details of the procedures with him at that time as well.  Will update after the clinic visit.  Thanks again ----- Message ----- From: Rachael Fee, MD Sent: 01/31/2021   1:57 PM EST To: Loretha Stapler, RN, Lemar Lofty., MD, #  EUS +/- ERCP next available.  Given the pretty real and definitely transient LFT elevations I expect I'd go ahead with ERCP even if no obvious stones noted.  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    ----- Message ----- From: Lemar Lofty., MD Sent: 01/31/2021   1:30 PM EST To: Rachael Fee, MD, Loretha Stapler, RN, #  SEC, I think that sounds reasonable.  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  So at least considering an ERCP with sphincteroplasty could be considered in a quick cleanout.  Would expect that he would have had more bile duct dilation.  As well, he does not meet criteria for functional disorder of the sphincter (previously SOD) since the bile duct is not dilated.  His pancreas enzymes have not dilated either.  Unless the wire ends up going into the pancreas, I would not normally just do a pancreatic sphincterotomy.  Talk with him and then let us know and certainly he can be set up for a double procedure to make sure nothing else is being missed.  As well, if no contraindication may be getting an MRI/MRCP would be helpful. Keep [place] up-to-date and then Alexia Freestone can work on scheduling as necessary. Thanks. GM ----- Message  ----- From: Jenel Lucks, MD Sent: 01/31/2021   1:04 PM EST To: Rachael Fee, MD, Loretha Stapler, RN, #  DJ/GM,  I spoke to DJ about this patient this morning.  56 y/o m w/ hx of CCY c/b bile leak in 2014 (ERCP by Arlyce Dice) with chronic vague GI symptoms (bloating predominant), but with a history of ED presentations showing significant transient liver enzyme elevation (mixed pattern) and multiple CT showing a CBD 10+ mm, and one CT with significant pneumobilia (Oct).   LAEs normalize in between the ED visits.  In August he was diagnosed with COVID and the LAE elevation was attributed to that, but in October his enzymes were again elevated in the absence of other potential etiologies. DJ was thinking scheduling for EUS +/- ERCP  I am seeing him in clinic for follow up Feb 15th.    Thanks all,  [place]

## 2021-02-01 NOTE — Telephone Encounter (Signed)
° ° °  Patient Name: Ethan Silva  DOB: 1965-03-16 MRN: [id number]  Primary Cardiologist: Bryan Lemma, MD  Chart reviewed as part of pre-operative protocol coverage. Given past medical history and time since last visit, based on ACC/AHA guidelines, [name] would be at acceptable risk for the planned procedure without further cardiovascular testing.   Patient may hold plavix for 5-7 days prior to the procedure and restart as soon as possible afterward.   The patient was advised that if he develops new symptoms prior to surgery to contact our office to arrange for a follow-up visit, and he verbalized understanding.  I will route this recommendation to the requesting party via Epic fax function and remove from pre-op pool.  Please call with questions.  [place], [place] 02/01/2021, 1:54 PM

## 2021-02-01 NOTE — Patient Instructions (Signed)
Medication Instructions:  No Changes *If you need a refill on your cardiac medications before your next appointment, please call your pharmacy*   Lab Work: Lipid Panel If you have labs (blood work) drawn today and your tests are completely normal, you will receive your results only by: MyChart Message (if you have MyChart) OR A paper copy in the mail If you have any lab test that is abnormal or we need to change your treatment, we will call you to review the results.   Testing/Procedures: [address], [address]. Your physician has requested that you have an ankle brachial index (ABI). During this test an ultrasound and blood pressure cuff are used to evaluate the arteries that supply the arms and legs with blood. Allow thirty minutes for this exam. There are no restrictions or special instructions.    Follow-Up: At [facility], you and your health needs are our priority.  As part of our continuing mission to provide you with exceptional heart care, we have created designated Provider Care Teams.  These Care Teams include your primary Cardiologist (physician) and Advanced Practice Providers (APPs -  Physician Assistants and Nurse Practitioners) who all work together to provide you with the care you need, when you need it.  We recommend signing up for the patient portal called "MyChart".  Sign up information is provided on this After Visit Summary.  MyChart is used to connect with patients for Virtual Visits (Telemedicine).  Patients are able to view lab/test results, encounter notes, upcoming appointments, etc.  Non-urgent messages can be sent to your provider as well.   To learn more about what you can do with MyChart, go to [email or web address].    Your next appointment:   7 month(s)  The format for your next appointment:   In Person  Provider:   Bryan Lemma, MD

## 2021-02-02 ENCOUNTER — Telehealth: Payer: Self-pay

## 2021-02-02 DIAGNOSIS — E785 Hyperlipidemia, unspecified: Secondary | ICD-10-CM

## 2021-02-02 NOTE — Telephone Encounter (Signed)
EUS/ERCP  scheduled, pt instructed and medications reviewed.  Patient instructions mailed to home.  Patient to call with any questions or concerns.   The pt will keep appt with Dr Tomasa Rand on 2/15 as well

## 2021-02-02 NOTE — Telephone Encounter (Addendum)
Called patient regarding lab results. Advised patient will send referral to pharmacy ----- Message from Cannon Kettle, PA-C sent at 02/02/2021 12:57 PM EST ----- Cholesterol has improved from November, though still not at goal.  If we are trying our best to decrease future risk of cardiac events, recommend we make an appointment with our lipid pharmacist.  Continue Crestor 40 mg and Zetia until your appointment with our lipid pharmacist. [place]-please make an appointment with the lipid Pharm.D.

## 2021-02-15 ENCOUNTER — Encounter: Payer: Self-pay | Admitting: Gastroenterology

## 2021-02-15 ENCOUNTER — Ambulatory Visit (INDEPENDENT_AMBULATORY_CARE_PROVIDER_SITE_OTHER): Payer: 59 | Admitting: Gastroenterology

## 2021-02-15 VITALS — BP 132/92 | HR 80 | Ht 63.0 in | Wt 159.4 lb

## 2021-02-15 DIAGNOSIS — K838 Other specified diseases of biliary tract: Secondary | ICD-10-CM

## 2021-02-15 DIAGNOSIS — R748 Abnormal levels of other serum enzymes: Secondary | ICD-10-CM

## 2021-02-15 DIAGNOSIS — R109 Unspecified abdominal pain: Secondary | ICD-10-CM

## 2021-02-15 NOTE — Patient Instructions (Signed)
Keep scheduled EUS.  If you are age 58 or older, your body mass index should be between 23-30. Your Body mass index is 28.24 kg/m. If this is out of the aforementioned range listed, please consider follow up with your Primary Care Provider.  If you are age 15 or younger, your body mass index should be between 19-25. Your Body mass index is 28.24 kg/m. If this is out of the aformentioned range listed, please consider follow up with your Primary Care Provider.   ________________________________________________________  The [facility] providers would like to encourage you to use [facility] to communicate with providers for non-urgent requests or questions.  Due to long hold times on the telephone, sending your provider a message by [facility] may be a faster and more efficient way to get a response.  Please allow 48 business hours for a response.  Please remember that this is for non-urgent requests.  _______________________________________________________

## 2021-02-15 NOTE — Progress Notes (Signed)
HPI : Ethan Silva is a 56 year old male who I initially saw in clinic January 10 for further evaluation of chronic abdominal pain and bloating.  Please see HPI below for description of his symptoms he was reporting at that time, as well as a summary of his recent ED visits and lab/radiology abnormalities.  Since that visit, he underwent an upper endoscopy on January 24 which was notable only for a few small gastric erosions and a Hill grade 3 gastroesophageal junction.  Biopsies of the stomach were negative for H. pylori. Today, the patient reports ongoing GI symptoms, however his description of his symptoms are quite different than the ones he was describing a month ago.  As with previous encounters, communication is difficult due to language barrier, even with presence of medical translator.  Patient describes "palpitations" and "trembling" in his abdomen.  He points to his left lower quadrant as to where he is experiencing the symptoms.  He feels discomfort in his abdomen when he bends over, and feels like there is something in his belly.  His appetite is good and he denies nausea or vomiting.  His weight has been stable.  He is having regular bowel movements, 2 to 3/day with formed solid stools, no blood. And evaluation for chronic liver disease was unremarkable.  A mitochondrial antibody titer was very slightly elevated at 25.  This was not felt to be clinically significant.  His most recent liver enzymes were normal (January 10) A right upper quadrant ultrasound was notable for a common bile duct of 10 mm, but was otherwise unremarkable.  No evidence of pneumobilia.  No evidence of steatosis  I reviewed the patient's labs and imaging with Dr. Ardis Hughs and Dr. Stefani Dama Roddy.  Because of his markedly elevated enzymes and an obstructive pattern, and history of dilated common bile duct and pneumobilia, it was felt that an endoscopic ultrasound is warranted.  He has been scheduled for this procedure  on March 16.  I reviewed with the patient the indications for this procedure described in detail with the procedure involved.  The patient is aware that he will need to hold his Plavix for 5 days prior to the procedure.   HPI from office visit Jan 10, 2021 HPI : Ethan Silva is a 56 year old Hispanic male with a history of CAD, HTN and remote history of  cholecystectomy c/b bile leak who is referred to [place] by Ethan Silva for further evaluation of chronic abdominal pain and bloating.  Patient states the he has these symptoms ever since his gallbladder was removed in 2014.    Of note, the patient primarily speaks Spanish and is accompanied by a medical interpreter, but even the interpreter had difficulty understanding the patient.  Obtaining a clear history and description of his symptoms was difficult. He states that he feels very bloated and gassy when he eats.  He has nausea and vomiting sometimes after eating, and this usually improves his bloating/discomfort.  He reports pain in the left upper abdomen which is separate from the bloating.  He has symptoms most days.  He denies abdominal discomfort unless he eats.   He has a history of heartburn as well.  No problems with acid regurgitation.  His heartburn improved after he started taking Protonix in July.  His other upper GI symptoms did not seem to improve with the Protonix.  He describes another chest pain, not burning in quality, which is sometimes noted on the left side and sometimes on  the right.  The pain is not associated with physical exertion or activity.  It does awaken him from sleep. He has regular bowel movements, with one bowel movement daily.  His stools are sometimes loose, but usually formed.  No blood in the stool.  He had a normal colonoscopy in 2015.  His weight has been stable. He denies any family history of GI malignancy.  He used to take NSAIDs frequently for headaches, but stopped in July   He went to the ED in August  24th with complaints of epigastric pain and weakness, as well as subjective fever, body aches and cough.  Also having nausea with vomiting.  He was found to be COVID positive, and found to have significant liver enzymes abnormalities (AST 958, ALT 671, Tbili 2.3), WBC 12.6.  CT scan showed suspected thickening of the transverse colon.  A very small focus of air in the biliary tree was noted on my review of the CT. His liver enzyme abnormalities were felt to be most likely related to his COVID infection and he was given Paxlovid with recommendations for outpatient GI follow up.   He went to the ED again on August 30th with complaints of constipation and abdominal pain with nausea but no vomiting.  He was noted to have elevated liver enzymes in a mixed pattern improved from the week previous (ALT 623, AST 254, ALP 213, Tbili 1.4).  CT showed large stool burden without biliary dilation.  A small focus of air was seen in the biliary tree on my review of the CT.  He was discharged home with laxatives.   His liver enzymes were checked in September and were largely unremarkable, with an ALT 59, but otherwise normal.   He went to the ED on October 12th with epigastric pain, where he was found to have significantly elevated LAEs in a mixed pattern (ALT 463, AST 397, ALP 354, Tbili 2.1).  A CT scan showed significant pneumobilia.  No specific diagnosis or therapy was given and he was advised to follow up with GI.  When he saw his PCP 2 weeks later, his symptoms and liver enzymes were much improved (ALT 57).   Past Medical History:  Diagnosis Date   Aortic stenosis, mild 07/2012   Very mild aortic stenosis noted on echo-mean gradient 11 mmHg.   Bile duct leak    CAD in native artery 07/14/2020   Small branch of OM 3 with 80% ostial stenosis.  Too small for PCI.  Otherwise questionable 30% LM and 30% PDA.   Chronic kidney disease 2017   left kidney stones   Headache(784.0)    Hx: of when BP is elevated    Hyperlipidemia    Hypertension    Non-STEMI (non-ST elevated myocardial infarction) ([place]) 07/13/2020   Admitted with hypertension and chest pain.  Cardiac cath showed small branch of an OM3 with 85% ostial stenosis not amenable to PCI.  No other significant disease noted.  Normal EF on echo.  Medical management.   Numbness and tingling in hands    Hx: of   Prediabetes    EGD: January 24: Few small gastric erosions, biopsies with chronic gastritis, negative for H. pylori, Hill grade 3 GEJ ERCP July 2014: treatment of bile leak following cholecystectomy ERCP Oct 2014: Removal of stent, sphincterotomy with balloon sweep/stone extraction Colonoscopy Jan 2015: Normal  Past Surgical History:  Procedure Laterality Date   CHOLECYSTECTOMY N/A 07/11/2012   Procedure: LAPAROSCOPIC CHOLECYSTECTOMY WITH INTRAOPERATIVE CHOLANGIOGRAM;  Surgeon:  Ralene Ok, MD;  Location: [place];  Service: General;  Laterality: N/A;   COLONOSCOPY     ERCP  07/16/2012   ERCP N/A 07/16/2012   Procedure: ENDOSCOPIC RETROGRADE CHOLANGIOPANCREATOGRAPHY (ERCP);  Surgeon: Inda Castle, MD;  Location: [facility];  Service: Gastroenterology;  Laterality: N/A;   ERCP N/A 10/06/2012   Procedure: ENDOSCOPIC RETROGRADE CHOLANGIOPANCREATOGRAPHY (ERCP);  Surgeon: Inda Castle, MD;  Location: [facility];  Service: Endoscopy;  Laterality: N/A;   HAND SURGERY     LEFT HEART CATH AND CORONARY ANGIOGRAPHY N/A 07/14/2020   Procedure: LEFT HEART CATH AND CORONARY ANGIOGRAPHY;  Surgeon: Belva Crome, MD;  Location: [facility];  Service: Cardiovascular; ? Culprit Lesion ~ 80% ostial small OM3 (too small & ostial for PCI). ~30% mid LM (at a bend).  LAD with D1 & D2 - normal, RCA minimal luminal irregularities ~ 30%. PDA   TRANSTHORACIC ECHOCARDIOGRAM  07/14/2020   (NSTEMI/Accelerated Hypertension): EF 55 to 60%.  No RWM A.  Very mild Aortic Stenosis-mean gradient 11 to mmHg.   Family History  Problem Relation Age of Onset    Gallstones Mother    Hypertension Mother    Gallstones Brother    Diabetes Paternal Uncle    Social History   Tobacco Use   Smoking status: Never    Passive exposure: Never   Smokeless tobacco: Never  Vaping Use   Vaping Use: Never used  Substance Use Topics   Alcohol use: No   Drug use: No   Current Outpatient Medications  Medication Sig Dispense Refill   acetaminophen (TYLENOL) 325 MG tablet Take 650 mg by mouth every 6 (six) hours as needed.     amLODipine (NORVASC) 5 MG tablet Take 1 tablet (5 mg total) by mouth daily. 30 tablet 11   aspirin 81 MG EC tablet Take 1 tablet (81 mg total) by mouth daily. Swallow whole. 90 tablet 3   clopidogrel (PLAVIX) 75 MG tablet Take 1 tablet (75 mg total) by mouth daily. 90 tablet 3   ezetimibe (ZETIA) 10 MG tablet Take 1 tablet (10 mg total) by mouth daily. 90 tablet 3   lisinopril (ZESTRIL) 10 MG tablet Take 1 tablet (10 mg total) by mouth daily. 90 tablet 3   nitroGLYCERIN (NITROSTAT) 0.4 MG SL tablet Place 1 tablet (0.4 mg total) under the tongue every 5 (five) minutes x 3 doses as needed for chest pain. 25 tablet 1   ondansetron (ZOFRAN ODT) 4 MG disintegrating tablet Take 1 tablet (4 mg total) by mouth every 8 (eight) hours as needed for nausea or vomiting. 10 tablet 0   pantoprazole (PROTONIX) 40 MG tablet Take 1 tablet (40 mg total) by mouth daily. 90 tablet 1   polyethylene glycol powder (GLYCOLAX/MIRALAX) 17 GM/SCOOP powder Take 17 g by mouth 2 (two) times daily as needed. 3350 g 1   rosuvastatin (CRESTOR) 40 MG tablet Take 1 tablet (40 mg total) by mouth daily. 90 tablet 3   No current facility-administered medications for this visit.   No Known Allergies   Review of Systems: All systems reviewed and negative except where noted in HPI.    US Abdomen Limited RUQ (LIVER/GB)  Result Date: 01/19/2021 CLINICAL DATA:  Epigastric pain, abnormal liver function tests EXAM: ULTRASOUND ABDOMEN LIMITED RIGHT UPPER QUADRANT COMPARISON:   Sonogram done on 06/16/2012 CT done on 10/12/2020 FINDINGS: Gallbladder: Gallbladder is not seen consistent with cholecystectomy. Common bile duct: Diameter: 10 mm, possibly related to previous cholecystectomy. There is  no significant dilation of intrahepatic bile ducts. Liver: No focal lesion identified. Within normal limits in parenchymal echogenicity. There is no definite demonstrable nodularity in the liver surface. Portal vein is patent on color Doppler imaging with normal direction of blood flow towards the liver. Other: None. IMPRESSION: Status post cholecystectomy. Prominence of extrahepatic bile ducts may be related to previous cholecystectomy. No other sonographic abnormality is seen in the right upper quadrant. Electronically Signed   By: Elmer Picker M.D.   On: 01/19/2021 15:52    Physical Exam: BP (!) 132/92 (BP Location: Left Arm, Patient Position: Sitting)    Pulse 80    Ht 5\' 3"  (1.6 m)    Wt 159 lb 6.4 oz (72.3 kg)    SpO2 95%    BMI 28.24 kg/m  Constitutional: Pleasant,well-developed, Hispanic male in no acute distress.  Accompanied by medical translator HEENT: Normocephalic and atraumatic. Conjunctivae are normal. No scleral icterus. Component Ref Range & Units 1 mo ago  H Pylori IgG Negative Negative    Cardiovascular: Normal rate, regular rhythm.  Pulmonary/chest: Effort normal and breath sounds normal. No wheezing, rales or rhonchi. Abdominal: Soft, nondistended, nontender. Bowel sounds active throughout. There are no masses palpable. No hepatomegaly. Extremities: no edema Neurological: Alert and oriented to person place and time. Skin: Skin is warm and dry. No rashes noted. Psychiatric: Normal mood and affect. Behavior is normal.  CBC    Component Value Date/Time   WBC 6.6 10/24/2020 0900   RBC 5.20 10/24/2020 0900   HGB 15.8 10/24/2020 0900   HGB 17.0 09/20/2017 1249   HCT 46.4 10/24/2020 0900   HCT 50.9 09/20/2017 1249   PLT 241.0 10/24/2020 0900   PLT 264  09/20/2017 1249   MCV 89.2 10/24/2020 0900   MCV 89 09/20/2017 1249   MCH 30.1 08/30/2020 0701   MCHC 34.1 10/24/2020 0900   RDW 14.0 10/24/2020 0900   RDW 14.2 09/20/2017 1249   LYMPHSABS 1.6 10/24/2020 0900   MONOABS 0.4 10/24/2020 0900   EOSABS 0.2 10/24/2020 0900   BASOSABS 0.1 10/24/2020 0900    CMP     Component Value Date/Time   NA 140 10/24/2020 0900   NA 140 09/17/2019 1120   K 3.5 10/24/2020 0900   CL 103 10/24/2020 0900   CO2 29 10/24/2020 0900   GLUCOSE 111 (H) 10/24/2020 0900   BUN 21 10/24/2020 0900   BUN 13 09/17/2019 1120   CREATININE 0.87 10/24/2020 0900   CREATININE 0.91 08/30/2015 1035   CALCIUM 9.3 10/24/2020 0900   PROT 7.2 01/10/2021 1107   PROT 7.5 09/17/2019 1120   ALBUMIN 4.4 01/10/2021 1107   ALBUMIN 4.5 09/17/2019 1120   AST 28 01/10/2021 1107   ALT 41 01/10/2021 1107   ALKPHOS 38 (L) 01/10/2021 1107   BILITOT 0.5 01/10/2021 1107   BILITOT 0.5 09/17/2019 1120   GFRNONAA >60 08/30/2020 0701   GFRAA 113 09/17/2019 1120   Component Ref Range & Units 1 mo ago  H Pylori IgG Negative Negative    Component Ref Range & Units 1 mo ago  Hepatitis C Ab NON-REACTIVE NON-REACTIVE   SIGNAL TO CUT-OFF <1.00 0.20    Component Ref Range & Units 1 mo ago 5 mo ago  Hepatitis B Surface Ag NON-REACTIVE NON-REACTIVE  NON REACTIVE R     Component Ref Range & Units 1 mo ago  Ceruloplasmin 18 - 36 mg/dL 24    Component Ref Range & Units 1 mo ago  Actin (Smooth Muscle)  Antibody (IGG) <20 U <20   Comment: .  Reference Range:     <20 U: Negative  >or=20 U: Positive    Component Ref Range & Units 1 mo ago  Mitochondrial M2 Ab, IgG <=20.0 U 25.2 High    Comment: .  Reference Range:     Negative:  <=20.0      U     Equivocal: 20.1 - 24.9 U     Positive:  >=25.0      U   Component Ref Range & Units 1 mo ago  A-1 Antitrypsin, Ser 83 - 199 mg/dL 180    Component Ref Range & Units 1 mo ago  IgG (Immunoglobin G), Serum 600 - 1,640 mg/dL 919     Component Ref Range & Units 1 mo ago  GGT 7 - 51 U/L 17     Component Ref Range & Units 1 mo ago  (tTG) Ab, IgA U/mL <1.0   Comment: Value          Interpretation  -----          --------------  <15.0          Antibody not detected  > or = 15.0    Antibody detected    Component Ref Range & Units 1 mo ago  Immunoglobulin A 47 - 310 mg/dL 553 High     Component Ref Range & Units 1 mo ago  Ferritin 22.0 - 322.0 ng/mL 118.7    Component Ref Range & Units 1 mo ago  Iron 42 - 165 ug/dL 104   Transferrin 212.0 - 360.0 mg/dL 297.0   Saturation Ratios 20.0 - 50.0 % 25.0   TIBC 250.0 - 450.0 mcg/dL 415.8    Component Ref Range & Units 1 mo ago  Total Bilirubin 0.2 - 1.2 mg/dL 0.5   Bilirubin, Direct 0.0 - 0.3 mg/dL 0.1   Alkaline Phosphatase 39 - 117 U/L 38 Low    AST 0 - 37 U/L 28   ALT 0 - 53 U/L 41   Total Protein 6.0 - 8.3 g/dL 7.2   Albumin 3.5 - 5.2 g/dL 4.4      Component Ref Range & Units 2 mo ago (10/24/20) 3 mo ago (10/12/20) 3 mo ago (10/12/20) 4 mo ago (09/13/20) 4 mo ago (08/30/20) 4 mo ago (08/26/20) 6 mo ago (07/15/20)  Sodium 135 - 145 mEq/L 140    140  140  139 R  140 R  136 R   Potassium 3.5 - 5.1 mEq/L 3.5    4.1  3.7  3.7 R  3.4 Low  R  3.5 R   Chloride 96 - 112 mEq/L 103    103  101  106 R  102 R  105 R   CO2 19 - 32 mEq/L 29    29  31  24  R  27 R  25 R   Glucose, Bld 70 - 99 mg/dL 111 High     107 High   100 High   111 High  CM  165 High  CM  90 CM   BUN 6 - 23 mg/dL 21    20  16  16  R  19 R  18 R   Creatinine, Ser 0.40 - 1.50 mg/dL 0.87    0.88  0.90  1.00 R  1.39 High  R  0.98 R   Total Bilirubin 0.2 - 1.2 mg/dL 0.7  2.1 High     0.7  1.4  High  R  2.3 High  R     Alkaline Phosphatase 39 - 117 U/L 100  354 High     74  213 High  R  85 R     AST 0 - 37 U/L 27  397 High     27  254 High  R  958 High  R     ALT 0 - 53 U/L 57 High   463 High     59 High   623 High  R  671 High  R     Total Protein 6.0 - 8.3 g/dL 7.5  7.6    7.6  7.2 R  6.9 R     Albumin  3.5 - 5.2 g/dL 4.4  4.2    4.3  3.5 R  4.1 R     GFR >60.00 mL/min 95.72    95.41 CM  94.82 CM         Comment: Calculated using the CKD-EPI Creatinine Equation (2021)  Calcium 8.4 - 10.5 mg/dL 9.3    9.7  9.5  8.8 Low  R  8.9 R  8.7 Low  R      ASSESSMENT AND PLAN: 56 year old male with CAD on Plavix, and a remote history of cholecystectomy complicated by bile leak with chronic abdominal pain which is quite variable and vague in his descriptions, with episodes of transient marked liver enzyme elevations and dilated bile duct as well as transient significant pneumobilia on CT.  His most recent imaging was an ultrasound on January 19 which showed a common bile duct 10 mm, but was otherwise normal.  His most recent liver enzymes were completely normal.  The etiology for his transient liver enzyme elevations and pneumobilia is unclear, but possibilities include retained stone versus stricture versus SOD (seems unlikely given history of sphincterotomy).  He is scheduled for an endoscopic ultrasound next month to further evaluate for these etiologies.  It is unclear if any of his chronic vague GI complaints are related to his biliary abnormalities.  I suggested that he try some IBgard to see if this may help with some of his abdominal symptoms.  He can follow-up with me following his EUS for further management of his chronic GI complaints.  Transient liver enzyme abnormalities, pneumobilia, CBD dilation - EUS - Holding Plavix 5 days prior  Vague, variable abdominal pain - IBGard  The details, risks (including bleeding, perforation, infection, missed lesions, medication reactions and possible hospitalization or surgery if complications occur), benefits, and alternatives to EUS were discussed with the patient and he consents to proceed.  Any further details of the procedure will be discussed with Dr. Dellia Beckwith E. Candis Schatz, MD [facility]  CC:  Mitchel Honour [place]

## 2021-02-16 ENCOUNTER — Encounter: Payer: Self-pay | Admitting: Gastroenterology

## 2021-02-27 ENCOUNTER — Other Ambulatory Visit: Payer: Self-pay | Admitting: Physician Assistant

## 2021-02-27 DIAGNOSIS — I739 Peripheral vascular disease, unspecified: Secondary | ICD-10-CM

## 2021-03-01 ENCOUNTER — Ambulatory Visit (HOSPITAL_COMMUNITY): Payer: 59

## 2021-03-08 ENCOUNTER — Encounter (HOSPITAL_COMMUNITY): Payer: Self-pay | Admitting: Gastroenterology

## 2021-03-15 NOTE — Anesthesia Preprocedure Evaluation (Addendum)
Anesthesia Evaluation  ?Patient identified by MRN, date of birth, ID band ?Patient awake ? ? ? ?Reviewed: ?Allergy & Precautions, NPO status , Patient's Chart, lab work & pertinent test results ? ?History of Anesthesia Complications ?Negative for: history of anesthetic complications ? ?Airway ?Mallampati: II ? ?TM Distance: >3 FB ?Neck ROM: Full ? ? ? Dental ? ?(+) Missing,  ?  ?Pulmonary ?neg pulmonary ROS,  ?  ?Pulmonary exam normal ? ? ? ? ? ? ? Cardiovascular ?hypertension, Pt. on medications ?+ CAD (on Plavix) and + Past MI  ?Normal cardiovascular exam ? ? ?  ?Neuro/Psych ?  ? GI/Hepatic ?GERD  Medicated and Controlled,  ?Endo/Other  ?negative endocrine ROS ? Renal/GU ?negative Renal ROS  ?negative genitourinary ?  ?Musculoskeletal ?negative musculoskeletal ROS ?(+)  ? Abdominal ?  ?Peds ? Hematology ?negative hematology ROS ?(+)   ?Anesthesia Other Findings ?Day of surgery medications reviewed with patient. ? Reproductive/Obstetrics ? ?  ? ? ? ? ? ? ? ? ? ? ? ? ? ?  ?  ? ? ? ? ? ? ? ?Anesthesia Physical ?Anesthesia Plan ? ?ASA: 3 ? ?Anesthesia Plan: General  ? ?Post-op Pain Management: Minimal or no pain anticipated  ? ?Induction: Intravenous ? ?PONV Risk Score and Plan: 2 and Treatment may vary due to age or medical condition, Ondansetron, Dexamethasone and Midazolam ? ?Airway Management Planned: Oral ETT ? ?Additional Equipment: None ? ?Intra-op Plan:  ? ?Post-operative Plan: Extubation in OR ? ?Informed Consent: I have reviewed the patients History and Physical, chart, labs and discussed the procedure including the risks, benefits and alternatives for the proposed anesthesia with the patient or authorized representative who has indicated his/her understanding and acceptance.  ? ? ? ?Dental advisory given and Interpreter used for interveiw ? ?Plan Discussed with: CRNA ? ?Anesthesia Plan Comments:   ? ? ? ? ? ?Anesthesia Quick Evaluation ? ?

## 2021-03-16 ENCOUNTER — Ambulatory Visit (HOSPITAL_BASED_OUTPATIENT_CLINIC_OR_DEPARTMENT_OTHER): Payer: 59 | Admitting: Anesthesiology

## 2021-03-16 ENCOUNTER — Ambulatory Visit (HOSPITAL_COMMUNITY): Payer: 59

## 2021-03-16 ENCOUNTER — Ambulatory Visit (HOSPITAL_COMMUNITY)
Admission: RE | Admit: 2021-03-16 | Discharge: 2021-03-16 | Disposition: A | Discharge status: Home / Self Care | Payer: 59 | Source: Ambulatory Visit | Attending: Gastroenterology | Admitting: Gastroenterology

## 2021-03-16 ENCOUNTER — Other Ambulatory Visit: Payer: Self-pay

## 2021-03-16 ENCOUNTER — Encounter (HOSPITAL_COMMUNITY): Payer: Self-pay | Admitting: Gastroenterology

## 2021-03-16 ENCOUNTER — Encounter (HOSPITAL_COMMUNITY)
Admission: RE | Disposition: A | Discharge status: Home / Self Care | Payer: Self-pay | Source: Ambulatory Visit | Attending: Gastroenterology

## 2021-03-16 ENCOUNTER — Ambulatory Visit (HOSPITAL_COMMUNITY): Payer: 59 | Admitting: Anesthesiology

## 2021-03-16 DIAGNOSIS — Z9049 Acquired absence of other specified parts of digestive tract: Secondary | ICD-10-CM | POA: Insufficient documentation

## 2021-03-16 DIAGNOSIS — Z79899 Other long term (current) drug therapy: Secondary | ICD-10-CM | POA: Insufficient documentation

## 2021-03-16 DIAGNOSIS — R748 Abnormal levels of other serum enzymes: Secondary | ICD-10-CM | POA: Insufficient documentation

## 2021-03-16 DIAGNOSIS — K8050 Calculus of bile duct without cholangitis or cholecystitis without obstruction: Secondary | ICD-10-CM

## 2021-03-16 DIAGNOSIS — K838 Other specified diseases of biliary tract: Secondary | ICD-10-CM

## 2021-03-16 DIAGNOSIS — I129 Hypertensive chronic kidney disease with stage 1 through stage 4 chronic kidney disease, or unspecified chronic kidney disease: Secondary | ICD-10-CM | POA: Diagnosis not present

## 2021-03-16 DIAGNOSIS — K839 Disease of biliary tract, unspecified: Secondary | ICD-10-CM | POA: Diagnosis present

## 2021-03-16 DIAGNOSIS — I10 Essential (primary) hypertension: Secondary | ICD-10-CM

## 2021-03-16 DIAGNOSIS — I2510 Atherosclerotic heart disease of native coronary artery without angina pectoris: Secondary | ICD-10-CM | POA: Diagnosis not present

## 2021-03-16 DIAGNOSIS — N189 Chronic kidney disease, unspecified: Secondary | ICD-10-CM | POA: Insufficient documentation

## 2021-03-16 DIAGNOSIS — I252 Old myocardial infarction: Secondary | ICD-10-CM

## 2021-03-16 DIAGNOSIS — K5900 Constipation, unspecified: Secondary | ICD-10-CM | POA: Insufficient documentation

## 2021-03-16 DIAGNOSIS — Z8616 Personal history of COVID-19: Secondary | ICD-10-CM | POA: Diagnosis not present

## 2021-03-16 DIAGNOSIS — K219 Gastro-esophageal reflux disease without esophagitis: Secondary | ICD-10-CM | POA: Insufficient documentation

## 2021-03-16 HISTORY — PX: ESOPHAGOGASTRODUODENOSCOPY: SHX5428

## 2021-03-16 HISTORY — PX: ENDOSCOPIC RETROGRADE CHOLANGIOPANCREATOGRAPHY (ERCP) WITH PROPOFOL: SHX5810

## 2021-03-16 HISTORY — PX: BILIARY DILATION: SHX6850

## 2021-03-16 HISTORY — PX: SPHINCTEROTOMY: SHX5279

## 2021-03-16 HISTORY — PX: EUS: SHX5427

## 2021-03-16 SURGERY — UPPER ENDOSCOPIC ULTRASOUND (EUS) RADIAL
Anesthesia: General

## 2021-03-16 MED ORDER — DEXAMETHASONE SODIUM PHOSPHATE 10 MG/ML IJ SOLN
INTRAMUSCULAR | Status: DC | PRN
  Administered 2021-03-16: 10 mg via INTRAVENOUS

## 2021-03-16 MED ORDER — MIDAZOLAM HCL 2 MG/2ML IJ SOLN
INTRAMUSCULAR | Status: AC
Start: 2021-03-16 — End: ?
  Filled 2021-03-16: qty 2

## 2021-03-16 MED ORDER — CIPROFLOXACIN IN D5W 400 MG/200ML IV SOLN
INTRAVENOUS | Status: AC
  Filled 2021-03-16: qty 200

## 2021-03-16 MED ORDER — LIDOCAINE 2% (20 MG/ML) 5 ML SYRINGE
INTRAMUSCULAR | Status: DC | PRN
  Administered 2021-03-16: 80 mg via INTRAVENOUS

## 2021-03-16 MED ORDER — SUGAMMADEX SODIUM 200 MG/2ML IV SOLN
INTRAVENOUS | Status: DC | PRN
  Administered 2021-03-16: 300 mg via INTRAVENOUS

## 2021-03-16 MED ORDER — FENTANYL CITRATE (PF) 100 MCG/2ML IJ SOLN: 25.0000 ug | INTRAMUSCULAR | Status: DC | PRN

## 2021-03-16 MED ORDER — LACTATED RINGERS IV SOLN: INTRAVENOUS | Status: DC | PRN

## 2021-03-16 MED ORDER — SODIUM CHLORIDE 0.9 % IV SOLN
INTRAVENOUS | Status: DC | PRN
  Administered 2021-03-16: 20 mL

## 2021-03-16 MED ORDER — FENTANYL CITRATE (PF) 100 MCG/2ML IJ SOLN
INTRAMUSCULAR | Status: DC | PRN
  Administered 2021-03-16 (×2): 50 ug via INTRAVENOUS

## 2021-03-16 MED ORDER — MIDAZOLAM HCL 5 MG/5ML IJ SOLN
INTRAMUSCULAR | Status: DC | PRN
  Administered 2021-03-16: 2 mg via INTRAVENOUS

## 2021-03-16 MED ORDER — DROPERIDOL 2.5 MG/ML IJ SOLN: 0.6250 mg | Freq: Once | INTRAMUSCULAR | Status: DC | PRN

## 2021-03-16 MED ORDER — GLUCAGON HCL RDNA (DIAGNOSTIC) 1 MG IJ SOLR
INTRAMUSCULAR | Status: AC
  Filled 2021-03-16: qty 2

## 2021-03-16 MED ORDER — SODIUM CHLORIDE 0.9 % IV SOLN: INTRAVENOUS | Status: DC

## 2021-03-16 MED ORDER — INDOMETHACIN 50 MG RE SUPP
RECTAL | Status: AC
  Filled 2021-03-16: qty 2

## 2021-03-16 MED ORDER — PROPOFOL 10 MG/ML IV BOLUS
INTRAVENOUS | Status: AC
  Filled 2021-03-16: qty 20

## 2021-03-16 MED ORDER — FENTANYL CITRATE (PF) 100 MCG/2ML IJ SOLN
INTRAMUSCULAR | Status: AC
Start: 2021-03-16 — End: ?
  Filled 2021-03-16: qty 2

## 2021-03-16 MED ORDER — PROPOFOL 10 MG/ML IV BOLUS
INTRAVENOUS | Status: DC | PRN
  Administered 2021-03-16: 140 mg via INTRAVENOUS

## 2021-03-16 MED ORDER — ROCURONIUM BROMIDE 10 MG/ML (PF) SYRINGE
PREFILLED_SYRINGE | INTRAVENOUS | Status: DC | PRN
Start: 2021-03-16 — End: 2021-03-16
  Administered 2021-03-16: 50 mg via INTRAVENOUS

## 2021-03-16 MED ORDER — INDOMETHACIN 50 MG RE SUPP
RECTAL | Status: DC | PRN
Start: 2021-03-16 — End: 2021-03-16
  Administered 2021-03-16: 100 mg via RECTAL

## 2021-03-16 MED ORDER — ONDANSETRON HCL 4 MG/2ML IJ SOLN
INTRAMUSCULAR | Status: DC | PRN
  Administered 2021-03-16: 4 mg via INTRAVENOUS

## 2021-03-16 MED ORDER — EPHEDRINE SULFATE-NACL 50-0.9 MG/10ML-% IV SOSY
PREFILLED_SYRINGE | INTRAVENOUS | Status: DC | PRN
  Administered 2021-03-16: 10 mg via INTRAVENOUS
  Administered 2021-03-16: 5 mg via INTRAVENOUS

## 2021-03-16 NOTE — Transfer of Care (Signed)
Immediate Anesthesia Transfer of Care Note ? ?Patient: Ethan Silva ? ?Procedure(s) Performed: Procedure(s): ?UPPER ENDOSCOPIC ULTRASOUND (EUS) RADIAL (N/A) ?ENDOSCOPIC RETROGRADE CHOLANGIOPANCREATOGRAPHY (ERCP) WITH PROPOFOL (N/A) ?ESOPHAGOGASTRODUODENOSCOPY (EGD) (N/A) ?BILIARY DILATION ? ?Patient Location: PACU ? ?Anesthesia Type:General ? ?Level of Consciousness:  sedated, patient cooperative and responds to stimulation ? ?Airway & Oxygen Therapy:Patient Spontanous Breathing and Patient connected to face mask oxgen ? ?Post-op Assessment:  Report given to PACU RN and Post -op Vital signs reviewed and stable ? ?Post vital signs:  Reviewed and stable ? ?Last Vitals:  ?Vitals:  ? 03/16/21 0857 03/16/21 1155  ?BP: (!) 160/101 (!) 144/87  ?Pulse: (!) 57 82  ?Resp: (!) 21 18  ?Temp: 36.8 ?C 36.7 ?C  ?SpO2: 98% 98%  ? ? ?Complications: No apparent anesthesia complications ? ?

## 2021-03-16 NOTE — Discharge Instructions (Signed)
YOU HAD AN ENDOSCOPIC PROCEDURE TODAY: Refer to the procedure report and other information in the discharge instructions given to you for any specific questions about what was found during the examination. If this information does not answer your questions, please call [place] office at [phone] to clarify.  ° °YOU SHOULD EXPECT: Some feelings of bloating in the abdomen. Passage of more gas than usual. Walking can help get rid of the air that was put into your GI tract during the procedure and reduce the bloating. If you had a lower endoscopy (such as a colonoscopy or flexible sigmoidoscopy) you may notice spotting of blood in your stool or on the toilet paper. Some abdominal soreness may be present for a day or two, also. ° °DIET: Your first meal following the procedure should be a light meal and then it is ok to progress to your normal diet. A half-sandwich or bowl of soup is an example of a good first meal. Heavy or fried foods are harder to digest and may make you feel nauseous or bloated. Drink plenty of fluids but you should avoid alcoholic beverages for 24 hours. If you had a esophageal dilation, please see attached instructions for diet.   ° °ACTIVITY: Your care partner should take you home directly after the procedure. You should plan to take it easy, moving slowly for the rest of the day. You can resume normal activity the day after the procedure however YOU SHOULD NOT DRIVE, use power tools, machinery or perform tasks that involve climbing or major physical exertion for 24 hours (because of the sedation medicines used during the test).  ° °SYMPTOMS TO REPORT IMMEDIATELY: °A gastroenterologist can be reached at any hour. Please call [phone]  for any of the following symptoms:  °Following lower endoscopy (colonoscopy, flexible sigmoidoscopy) °Excessive amounts of blood in the stool  °Significant tenderness, worsening of abdominal pains  °Swelling of the abdomen that is new, acute  °Fever of 100° or  higher  °Following upper endoscopy (EGD, EUS, ERCP, esophageal dilation) °Vomiting of blood or coffee ground material  °New, significant abdominal pain  °New, significant chest pain or pain under the shoulder blades  °Painful or persistently difficult swallowing  °New shortness of breath  °Black, tarry-looking or red, bloody stools ° °FOLLOW UP:  °If any biopsies were taken you will be contacted by phone or by letter within the next 1-3 weeks. Call [phone]  if you have not heard about the biopsies in 3 weeks.  °Please also call with any specific questions about appointments or follow up tests. ° °

## 2021-03-16 NOTE — Anesthesia Procedure Notes (Signed)
Procedure Name: Intubation ?Date/Time: 03/16/2021 11:00 AM ?Performed by: Lavina Hamman, CRNA ?Pre-anesthesia Checklist: Patient identified, Emergency Drugs available, Suction available, Patient being monitored and Timeout performed ?Patient Re-evaluated:Patient Re-evaluated prior to induction ?Oxygen Delivery Method: Circle system utilized ?Preoxygenation: Pre-oxygenation with 100% oxygen ?Induction Type: IV induction ?Ventilation: Mask ventilation without difficulty ?Laryngoscope Size: Mac and 3 ?Grade View: Grade III ?Tube type: Oral ?Tube size: 7.0 mm ?Number of attempts: 2 ?Airway Equipment and Method: Stylet ?Placement Confirmation: ETT inserted through vocal cords under direct vision, positive ETCO2, CO2 detector and breath sounds checked- equal and bilateral ?Secured at: 22 cm ?Tube secured with: Tape ?Dental Injury: Teeth and Oropharynx as per pre-operative assessment  ?Difficulty Due To: Difficult Airway- due to anterior larynx and Difficult Airway- due to dentition ?Future Recommendations: Recommend- induction with short-acting agent, and alternative techniques readily available ?Comments: ATOI, Mac 3 Grade 3, Miller 3 G2 by [facility]   ? ? ? ? ?

## 2021-03-16 NOTE — Op Note (Signed)
[facility] ?Patient Name: Ethan Silva ?Procedure Date: 03/16/2021 ?MRN: [id number] ?Attending MD: Milus Banister , MD ?Date of Birth: Jun 11, 1965 ?CSN: [id number] ?Age: 56 ?Admit Type: Outpatient ?Procedure:                ERCP ?Indications:              Remote gallbladder resection, complicated by bile  ?                          duct leak and 2 ERCPs in 2013. Now with  ?                          intermittent transiently elevated liver tests and  ?                          abdominal pain, slightly dilated bile duct on  ?                          imaging. ?Providers:                Milus Banister, MD, Burtis Junes, RN, Primitivo Gauze  ?                          [place], Technician, [employer],  ?                          Technician ?Referring MD:             Dustin Flock, MD ?Medicines:                General Anesthesia, Indomethacin 100 mg PR ?Complications:            No immediate complications. Estimated blood loss:  ?                          None ?Estimated Blood Loss:     Estimated blood loss: none. ?Procedure:                Pre-Anesthesia Assessment: ?                          - Prior to the procedure, a History and Physical  ?                          was performed, and patient medications and  ?                          allergies were reviewed. The patient's tolerance of  ?                          previous anesthesia was also reviewed. The risks  ?                          and benefits of the procedure and the sedation  ?                          options and risks were discussed  with the patient.  ?                          All questions were answered, and informed consent  ?                          was obtained. Prior Anticoagulants: The patient has  ?                          taken Plavix (clopidogrel), last dose was 5 days  ?                          prior to procedure. ASA Grade Assessment: II - A  ?                          patient with mild systemic disease. After  reviewing  ?                          the risks and benefits, the patient was deemed in  ?                          satisfactory condition to undergo the procedure. ?                          After obtaining informed consent, the scope was  ?                          passed under direct vision. Throughout the  ?                          procedure, the patient's blood pressure, pulse, and  ?                          oxygen saturations were monitored continuously. The  ?                          [id number]) Olympus duodenoscope was  ?                          introduced through the mouth, and used to inject  ?                          contrast into and used to inject contrast into the  ?                          bile duct. The ERCP was accomplished without  ?                          difficulty. The patient tolerated the procedure  ?                          well. ?Scope In: ?Scope Out: ?Findings: ?     Scout film showed surgical clips in the right upper quadrant. The  ?  duodenoscope was advanced to the region of the major papilla without  ?     detailed examination of the upper GI tract. There was mild evidence of  ?     previous biliary sphincterotomy. I used a 44 autotome over a .035 Hydra  ?     Jagwire to cannulate the bile duct and injected contrast. Cholangiogram  ?     showed a slightly dilated extrahepatic biliary tree. CBD was 7 mm,  ?     common hepatic duct was 8 or 9 mm. There were no clear filling defects.  ?     Given the pretest suspicion of sphincterotomy stenosis I decided to  ?     extend the previous sphincterotomy however there was no room to perform  ?     a standard biliary sphincterotomy extension and so I elected to dilate  ?     the sphincterotomy site with a 6 mm diameter dilating Hurricaine balloon  ?     held inflated for 1 minute. This led to immediate contrast delivery into  ?     the duodenum. There were no stones, no stone particles and no purulence.  ?     The main  pancreatic duct was never cannulated with a wire or injected  ?     with dye. ?Impression:               - Slightly dilated extrahepatic bile duct. ?                          - Clinical suspicion for stricture at previous  ?                          biliary sphincterotomy site, treated with balloon  ?                          dilation today. ?Moderate Sedation: ?     Not Applicable - Patient had care per Anesthesia. ?Recommendation:           - Discharge patient to home. ?                          - Follow clinically. ?Procedure Code(s):        --- Professional --- ?                          [phone], Endoscopic retrograde  ?                          cholangiopancreatography (ERCP); with  ?                          trans-endoscopic balloon dilation of  ?                          biliary/pancreatic duct(s) or of ampulla  ?                          (sphincteroplasty), including sphincterotomy, when  ?                          performed, each  duct ?Diagnosis Code(s):        --- Professional --- ?                          K80.50, Calculus of bile duct without cholangitis  ?                          or cholecystitis without obstruction ?CPT copyright 2019 American Medical Association. All rights reserved. ?The codes documented in this report are preliminary and upon coder review may  ?be revised to meet current compliance requirements. ?Milus Banister, MD ?03/16/2021 11:51:31 AM ?This report has been signed electronically. ?Number of Addenda: 0 ?

## 2021-03-16 NOTE — Anesthesia Postprocedure Evaluation (Signed)
Anesthesia Post Note ? ?Patient: Mathius Yahola ? ?Procedure(s) Performed: UPPER ENDOSCOPIC ULTRASOUND (EUS) RADIAL ?ENDOSCOPIC RETROGRADE CHOLANGIOPANCREATOGRAPHY (ERCP) WITH PROPOFOL ?ESOPHAGOGASTRODUODENOSCOPY (EGD) ?BILIARY DILATION ? ?  ? ?Patient location during evaluation: PACU ?Anesthesia Type: General ?Level of consciousness: awake and alert ?Pain management: pain level controlled ?Vital Signs Assessment: post-procedure vital signs reviewed and stable ?Respiratory status: spontaneous breathing, nonlabored ventilation and respiratory function stable ?Cardiovascular status: blood pressure returned to baseline ?Postop Assessment: no apparent nausea or vomiting ?Anesthetic complications: no ? ? ?No notable events documented. ? ?Last Vitals:  ?Vitals:  ? 03/16/21 1205 03/16/21 1215  ?BP: 133/86 (!) 139/96  ?Pulse: 79 79  ?Resp: 18 17  ?Temp:    ?SpO2: 100% 96%  ?  ?Last Pain:  ?Vitals:  ? 03/16/21 1215  ?TempSrc:   ?PainSc: 0-No pain  ? ? ?  ?  ?  ?  ?  ?  ? ?Marthenia Rolling ? ? ? ? ?

## 2021-03-16 NOTE — Op Note (Signed)
[facility] ?Patient Name: Ethan Silva ?Procedure Date: 03/16/2021 ?MRN: [id number] ?Attending MD: Milus Banister , MD ?Date of Birth: 09-22-1965 ?CSN: [id number] ?Age: 56 ?Admit Type: Outpatient ?Procedure:                Upper EUS ?Indications:              remote GB resection, c/b bile duct leak, ERCP Dr.  Marland Kitchen                          Deatra Ina 2013 confirmed leak, placed plastic bile  ?                          duct stent; Repeat ERCP 2013 Dr. Deatra Ina removed  ?                          stent, performed biliary sphincerotomy, removed  ?                          stone debris; no intermittent abd pains with  ?                          transiently elevated liver tests ?Providers:                Milus Banister, MD, Burtis Junes, RN, Primitivo Gauze  ?                          [place], Technician, [employer],  ?                          Technician ?Referring MD:              ?Medicines:                General Anesthesia ?Complications:            No immediate complications. Estimated blood loss:  ?                          None. ?Estimated Blood Loss:     Estimated blood loss: none. ?Procedure:                Pre-Anesthesia Assessment: ?                          - Prior to the procedure, a History and Physical  ?                          was performed, and patient medications and  ?                          allergies were reviewed. The patient's tolerance of  ?                          previous anesthesia was also reviewed. The risks  ?                          and benefits of the procedure and the  sedation  ?                          options and risks were discussed with the patient.  ?                          All questions were answered, and informed consent  ?                          was obtained. Prior Anticoagulants: The patient has  ?                          taken Plavix (clopidogrel), last dose was 5 days  ?                          prior to procedure. ASA Grade Assessment: II - A  ?                           patient with mild systemic disease. After reviewing  ?                          the risks and benefits, the patient was deemed in  ?                          satisfactory condition to undergo the procedure. ?                          After obtaining informed consent, the endoscope was  ?                          passed under direct vision. Throughout the  ?                          procedure, the patient's blood pressure, pulse, and  ?                          oxygen saturations were monitored continuously. The  ?                          [id number]) Olympus radial ultrasound  ?                          scope was introduced through the mouth, and  ?                          advanced to the second part of duodenum. The upper  ?                          EUS was accomplished without difficulty. The  ?                          patient tolerated the procedure well. ?Scope In: ?Scope Out: ?Findings: ?     ENDOSCOPIC FINDING (limited views with radial echoendoscope): : ?  The examined esophagus was endoscopically normal. ?     The entire examined stomach was endoscopically normal. ?     The examined duodenum was endoscopically normal. The major papilla had  ?     evidence of previous biliary sphincterotomy but was otherwise normal. ?     ENDOSONOGRAPHIC FINDING: : ?     1. The CBD was slightly dilated, 7 mm distally. Common hepatic duct was  ?     9 or 10 mm proximally. There were no obvious retained bile duct stones ?     2. No pancreatic masses, no biliary masses. ?     3. No signs of chronic pancreatitis ?     4. Gallbladder surgically absent ?     5. Limited views of the liver, spleen, portal and splenic vessels were  ?     all normal ?Impression:               - Slightly dilated extrahepatic biliary tree  ?                          without obvious biliary stones. ?                          - Gallbladder surgically absent ?Moderate Sedation: ?     Not Applicable - Patient had care  per Anesthesia. ?Recommendation:           - ERCP now. ?Procedure Code(s):        --- Professional --- ?                          [phone], Esophagogastroduodenoscopy, flexible,  ?                          transoral; with endoscopic ultrasound examination  ?                          limited to the esophagus, stomach or duodenum, and  ?                          adjacent structures ?Diagnosis Code(s):        --- Professional --- ?                          R74.8, Abnormal levels of other serum enzymes ?CPT copyright 2019 American Medical Association. All rights reserved. ?The codes documented in this report are preliminary and upon coder review may  ?be revised to meet current compliance requirements. ?Milus Banister, MD ?03/16/2021 11:41:10 AM ?This report has been signed electronically. ?Number of Addenda: 0 ?

## 2021-03-16 NOTE — Interval H&P Note (Signed)
History and Physical Interval Note: ? ?03/16/2021 ?8:36 AM ? ?[facility] [place] Summer  has presented today for surgery, with the diagnosis of elevated lft, cbd dilation, bloating, abd pain.  The various methods of treatment have been discussed with the patient and family. After consideration of risks, benefits and other options for treatment, the patient has consented to  Procedure(s): ?UPPER ENDOSCOPIC ULTRASOUND (EUS) RADIAL (N/A) ?ENDOSCOPIC RETROGRADE CHOLANGIOPANCREATOGRAPHY (ERCP) WITH PROPOFOL (N/A) as a surgical intervention.  The patient's history has been reviewed, patient examined, no change in status, stable for surgery.  I have reviewed the patient's chart and labs.  Questions were answered to the patient's satisfaction.   ? ? ?Rachael Fee ? ? ?

## 2021-03-17 ENCOUNTER — Encounter (HOSPITAL_COMMUNITY): Payer: Self-pay | Admitting: Gastroenterology

## 2021-03-23 ENCOUNTER — Other Ambulatory Visit: Payer: Self-pay

## 2021-03-23 ENCOUNTER — Ambulatory Visit (HOSPITAL_COMMUNITY)
Admission: RE | Admit: 2021-03-23 | Discharge: 2021-03-23 | Disposition: A | Discharge status: Home / Self Care | Payer: 59 | Source: Ambulatory Visit | Attending: Cardiology | Admitting: Cardiology

## 2021-03-23 DIAGNOSIS — I739 Peripheral vascular disease, unspecified: Secondary | ICD-10-CM | POA: Diagnosis present

## 2021-03-24 ENCOUNTER — Telehealth: Payer: Self-pay

## 2021-03-24 NOTE — Telephone Encounter (Addendum)
Called patient regarding results. Patient had understanding of results.----- Message from Cannon Kettle, PA-C sent at 03/23/2021 12:44 PM EDT ----- ?Great news - Normal ABIs.  No evidence of peripheral arterial disease. ?

## 2021-03-29 ENCOUNTER — Telehealth: Payer: Self-pay

## 2021-03-29 NOTE — Telephone Encounter (Addendum)
Called patient regarding results. Patient had   understanding of results.----- Message from Cannon Kettle, PA-C sent at 03/28/2021  3:26 PM EDT ----- ?Normal ABIs, no evidence of lower extremity arterial disease.    ?

## 2021-03-31 ENCOUNTER — Encounter: Payer: Self-pay | Admitting: Pharmacist

## 2021-03-31 ENCOUNTER — Telehealth: Payer: Self-pay | Admitting: Pharmacist

## 2021-03-31 ENCOUNTER — Ambulatory Visit: Payer: 59 | Admitting: Pharmacist

## 2021-03-31 DIAGNOSIS — I25119 Atherosclerotic heart disease of native coronary artery with unspecified angina pectoris: Secondary | ICD-10-CM

## 2021-03-31 DIAGNOSIS — I214 Non-ST elevation (NSTEMI) myocardial infarction: Secondary | ICD-10-CM

## 2021-03-31 DIAGNOSIS — E785 Hyperlipidemia, unspecified: Secondary | ICD-10-CM

## 2021-03-31 MED ORDER — MULTI FOR HIM PO TABS: 1.0000 | ORAL_TABLET | Freq: Every day | ORAL | 3 refills | Status: DC

## 2021-03-31 NOTE — Telephone Encounter (Signed)
PA for Nexlizet sent to [employer] [facility]).  Key  [id number] ?

## 2021-03-31 NOTE — Progress Notes (Signed)
Patient ID: Ethan Silva                 DOB: December 28, 1965                    MRN: [id number] ? ? ? ? ?HPI: ?Ethan Silva is a 56 y.o. male patient referred to lipid clinic by [name]. PMH is significant for NSTEMI, CAD, HLD, HTN, and pre diabetes. Currently managed on ezetimibe and rosuvastatin. ? ?Patient presents today in good spirits with interpreter [name]. Reports he feels well and has no chest pain.  Has changed his diet and eats smaller portions.   ? ?Currently managed on rosuvastatin 40mg  and ezetimibe 10mg  without any adverse effects. Reports medication compliance.   ? ?Current Medications:  ?Ezetimibe 10mg  daily ?Rosuvastatin 40mg  daily ? ?Intolerances: N/A ? ?Risk Factors:  ?Hx of NSTEMI ?CAD ?HTN ? ?LDL goal: <70 ? ?Labs: TC 151, Trigs 98, HDL 53, LDL 80 (02/01/21 on rosuvastatin 40mg  and Zetia 10mg ) ? ?Past Medical History:  ?Diagnosis Date  ? Aortic stenosis, mild 07/2012  ? Very mild aortic stenosis noted on echo-mean gradient 11 mmHg.  ? Bile duct leak   ? CAD in native artery 07/14/2020  ? Small branch of OM 3 with 80% ostial stenosis.  Too small for PCI.  Otherwise questionable 30% LM and 30% PDA.  ? Chronic kidney disease 2017  ? left kidney stones  ? Headache(784.0)   ? Hx: of when BP is elevated  ? Hyperlipidemia   ? Hypertension   ? Non-STEMI (non-ST elevated myocardial infarction) (HCC) 07/13/2020  ? Admitted with hypertension and chest pain.  Cardiac cath showed small branch of an OM3 with 85% ostial stenosis not amenable to PCI.  No other significant disease noted.  Normal EF on echo.  Medical management.  ? Numbness and tingling in hands   ? Hx: of  ? Prediabetes   ? ? ?Current Outpatient Medications on File Prior to Visit  ?Medication Sig Dispense Refill  ? acetaminophen (TYLENOL) 325 MG tablet Take 325 mg by mouth every 6 (six) hours as needed for moderate pain.    ? amLODipine (NORVASC) 5 MG tablet Take 1 tablet (5 mg total) by mouth daily. 30 tablet 11  ?  aspirin 81 MG EC tablet Take 1 tablet (81 mg total) by mouth daily. Swallow whole. 90 tablet 3  ? clopidogrel (PLAVIX) 75 MG tablet Take 1 tablet (75 mg total) by mouth daily. 90 tablet 3  ? ezetimibe (ZETIA) 10 MG tablet Take 1 tablet (10 mg total) by mouth daily. 90 tablet 3  ? lisinopril (ZESTRIL) 10 MG tablet Take 1 tablet (10 mg total) by mouth daily. (Patient taking differently: Take 10 mg by mouth daily as needed (high blood pressure).) 90 tablet 3  ? nitroGLYCERIN (NITROSTAT) 0.4 MG SL tablet Place 1 tablet (0.4 mg total) under the tongue every 5 (five) minutes x 3 doses as needed for chest pain. 25 tablet 1  ? ondansetron (ZOFRAN ODT) 4 MG disintegrating tablet Take 1 tablet (4 mg total) by mouth every 8 (eight) hours as needed for nausea or vomiting. 10 tablet 0  ? pantoprazole (PROTONIX) 40 MG tablet Take 1 tablet (40 mg total) by mouth daily. 90 tablet 1  ? polyethylene glycol powder (GLYCOLAX/MIRALAX) 17 GM/SCOOP powder Take 17 g by mouth 2 (two) times daily as needed. 3350 g 1  ? rosuvastatin (CRESTOR) 40 MG tablet Take 1 tablet (40 mg total) by mouth  daily. 90 tablet 3  ? ?No current facility-administered medications on file prior to visit.  ? ? ?No Known Allergies ? ?Assessment/Plan: ? ?1. Hyperlipidemia - Patient's LDL 80 which is above goal of <70 despite treatment with high intensity statin, Zetia, and lifestyle changes.  Discussed options with patient including continuing lifestyle changes and rechecking in 3 months, starting PCSK9i, or starting Nexletol/Nexlizet.  Patient prefers to try Nexlizet.  Will be a prior authorization which I will complete and contact patient when approved.  Advised he can d/c Zetia at that time due to [facility] being combination pill. When he starts medication will recheck lipid panel in 2-3 months.  Patient voiced understanding.   ? ?Continue rosuvastatin 40mg  daily ?Start Nexlizet 180/10mg  daily ?D/c Zetia once he starts Nexlizet ?Recheck lipid panel in 2-3  months. ? ? , PharmD, BCACP, CDCES, CPP ?[address], [address] ?[place], [place], [place] ?Phone: [phone], Fax: [phone]  ?  ?

## 2021-03-31 NOTE — Patient Instructions (Addendum)
It was nice meeting you today ? ?We would like your LDL (bad cholesterol) to be less than 70 ? ?Please continue your rosuvastatin and ezetimibe ? ?We will start a new medication called Nexlizet.  You will take one tablet once a day.   ? ?Once you start the medication, you can stop the ezetimibe ? ?I will complete the prior authorization for you and contact you when it is approved ? ?Please call with any questions ? ?Karren Cobble, PharmD, BCACP, [place], CPP ?[address], [address] ?[place], [place], [address] ?Phone: [phone], Fax: [phone]  ? ?

## 2021-04-04 MED ORDER — NEXLIZET 180-10 MG PO TABS: 1.0000 | ORAL_TABLET | Freq: Every day | ORAL | 3 refills | Status: DC

## 2021-04-04 NOTE — Addendum Note (Signed)
Addended by: Rollen Sox on: 04/04/2021 07:55 AM ? ? Modules accepted: Orders ? ?

## 2021-04-04 NOTE — Telephone Encounter (Signed)
Called and spoke with representative at the pharmacy and they stated the cost went from $75 to $25 for the nexlizet  ?

## 2021-04-04 NOTE — Telephone Encounter (Signed)
Please activate copay card for patient and call copay card info into pharmacy.  Rx has aleady been sent ?

## 2021-04-04 NOTE — Telephone Encounter (Addendum)
Nexlizet PA approved through 04/01/22. ?

## 2021-04-04 NOTE — Telephone Encounter (Signed)
Copay card activated but the pharmacy is closed I will call after 9am: ? ?

## 2021-04-23 ENCOUNTER — Other Ambulatory Visit: Payer: Self-pay

## 2021-04-23 ENCOUNTER — Emergency Department (HOSPITAL_BASED_OUTPATIENT_CLINIC_OR_DEPARTMENT_OTHER): Payer: 59 | Admitting: Radiology

## 2021-04-23 ENCOUNTER — Emergency Department (HOSPITAL_BASED_OUTPATIENT_CLINIC_OR_DEPARTMENT_OTHER)
Admission: EM | Admit: 2021-04-23 | Discharge: 2021-04-23 | Disposition: A | Discharge status: Home / Self Care | Payer: 59 | Attending: Emergency Medicine | Admitting: Emergency Medicine

## 2021-04-23 ENCOUNTER — Encounter (HOSPITAL_BASED_OUTPATIENT_CLINIC_OR_DEPARTMENT_OTHER): Payer: Self-pay

## 2021-04-23 ENCOUNTER — Emergency Department (HOSPITAL_BASED_OUTPATIENT_CLINIC_OR_DEPARTMENT_OTHER): Payer: 59

## 2021-04-23 DIAGNOSIS — I129 Hypertensive chronic kidney disease with stage 1 through stage 4 chronic kidney disease, or unspecified chronic kidney disease: Secondary | ICD-10-CM | POA: Diagnosis not present

## 2021-04-23 DIAGNOSIS — Z20822 Contact with and (suspected) exposure to covid-19: Secondary | ICD-10-CM | POA: Diagnosis not present

## 2021-04-23 DIAGNOSIS — M7918 Myalgia, other site: Secondary | ICD-10-CM | POA: Diagnosis not present

## 2021-04-23 DIAGNOSIS — R110 Nausea: Secondary | ICD-10-CM | POA: Diagnosis not present

## 2021-04-23 DIAGNOSIS — R109 Unspecified abdominal pain: Secondary | ICD-10-CM | POA: Diagnosis not present

## 2021-04-23 DIAGNOSIS — Z7982 Long term (current) use of aspirin: Secondary | ICD-10-CM | POA: Diagnosis not present

## 2021-04-23 DIAGNOSIS — Z7902 Long term (current) use of antithrombotics/antiplatelets: Secondary | ICD-10-CM | POA: Insufficient documentation

## 2021-04-23 DIAGNOSIS — I2510 Atherosclerotic heart disease of native coronary artery without angina pectoris: Secondary | ICD-10-CM | POA: Diagnosis not present

## 2021-04-23 DIAGNOSIS — Z79899 Other long term (current) drug therapy: Secondary | ICD-10-CM | POA: Insufficient documentation

## 2021-04-23 DIAGNOSIS — R42 Dizziness and giddiness: Secondary | ICD-10-CM | POA: Insufficient documentation

## 2021-04-23 DIAGNOSIS — R590 Localized enlarged lymph nodes: Secondary | ICD-10-CM | POA: Diagnosis not present

## 2021-04-23 DIAGNOSIS — R6883 Chills (without fever): Secondary | ICD-10-CM | POA: Diagnosis not present

## 2021-04-23 DIAGNOSIS — R0789 Other chest pain: Secondary | ICD-10-CM | POA: Diagnosis not present

## 2021-04-23 DIAGNOSIS — N189 Chronic kidney disease, unspecified: Secondary | ICD-10-CM | POA: Diagnosis not present

## 2021-04-23 DIAGNOSIS — R5381 Other malaise: Secondary | ICD-10-CM | POA: Insufficient documentation

## 2021-04-23 LAB — BASIC METABOLIC PANEL
Anion gap: 11 (ref 5–15)
BUN: 27 mg/dL — ABNORMAL HIGH (ref 6–20)
CO2: 27 mmol/L (ref 22–32)
Calcium: 10.1 mg/dL (ref 8.9–10.3)
Chloride: 101 mmol/L (ref 98–111)
Creatinine, Ser: 1.29 mg/dL — ABNORMAL HIGH (ref 0.61–1.24)
GFR, Estimated: >60 mL/min (ref >60)
Glucose, Bld: 173 mg/dL — ABNORMAL HIGH (ref 70–99)
Potassium: 3.7 mmol/L (ref 3.5–5.1)
Sodium: 139 mmol/L (ref 135–145)

## 2021-04-23 LAB — CBC
HCT: 51.2 % (ref 39.0–52.0)
Hemoglobin: 17.3 g/dL — ABNORMAL HIGH (ref 13.0–17.0)
MCH: 29.5 pg (ref 26.0–34.0)
MCHC: 33.8 g/dL (ref 30.0–36.0)
MCV: 87.2 fL (ref 80.0–100.0)
Platelets: 219 10*3/uL (ref 150–400)
RBC: 5.87 MIL/uL — ABNORMAL HIGH (ref 4.22–5.81)
RDW: 13.1 % (ref 11.5–15.5)
WBC: 7.7 10*3/uL (ref 4.0–10.5)
nRBC: 0 % (ref 0.0–0.2)

## 2021-04-23 LAB — HEPATIC FUNCTION PANEL
ALT: 164 U/L — ABNORMAL HIGH (ref 0–44)
AST: 146 U/L — ABNORMAL HIGH (ref 15–41)
Albumin: 4.9 g/dL (ref 3.5–5.0)
Alkaline Phosphatase: 37 U/L — ABNORMAL LOW (ref 38–126)
Bilirubin, Direct: 0.2 mg/dL (ref 0.0–0.2)
Indirect Bilirubin: 0.5 mg/dL (ref 0.3–0.9)
Total Bilirubin: 0.7 mg/dL (ref 0.3–1.2)
Total Protein: 8 g/dL (ref 6.5–8.1)

## 2021-04-23 LAB — TROPONIN I (HIGH SENSITIVITY)
Troponin I (High Sensitivity): 4 ng/L (ref <18)
Troponin I (High Sensitivity): 3 ng/L (ref <18)

## 2021-04-23 LAB — RESP PANEL BY RT-PCR (FLU A&B, COVID) ARPGX2
Influenza A by PCR: NEGATIVE
Influenza B by PCR: NEGATIVE
SARS Coronavirus 2 by RT PCR: NEGATIVE

## 2021-04-23 LAB — LIPASE, BLOOD: Lipase: 23 U/L (ref 11–51)

## 2021-04-23 MED ORDER — FAMOTIDINE IN NACL 20-0.9 MG/50ML-% IV SOLN
20.0000 mg | Freq: Once | INTRAVENOUS | Status: AC
  Administered 2021-04-23: 20 mg via INTRAVENOUS
  Filled 2021-04-23: qty 50

## 2021-04-23 MED ORDER — KETOROLAC TROMETHAMINE 15 MG/ML IJ SOLN
15.0000 mg | Freq: Once | INTRAMUSCULAR | Status: AC
  Administered 2021-04-23: 15 mg via INTRAVENOUS
  Filled 2021-04-23: qty 1

## 2021-04-23 MED ORDER — IOHEXOL 350 MG/ML SOLN
100.0000 mL | Freq: Once | INTRAVENOUS | Status: AC | PRN
  Administered 2021-04-23: 100 mL via INTRAVENOUS

## 2021-04-23 MED ORDER — LIDOCAINE VISCOUS HCL 2 % MT SOLN
15.0000 mL | Freq: Once | OROMUCOSAL | Status: AC
  Administered 2021-04-23: 15 mL via ORAL
  Filled 2021-04-23: qty 15

## 2021-04-23 MED ORDER — SODIUM CHLORIDE 0.9 % IV BOLUS
1000.0000 mL | Freq: Once | INTRAVENOUS | Status: AC
  Administered 2021-04-23: 1000 mL via INTRAVENOUS

## 2021-04-23 MED ORDER — ALUM & MAG HYDROXIDE-SIMETH 200-200-20 MG/5ML PO SUSP
30.0000 mL | Freq: Once | ORAL | Status: AC
  Administered 2021-04-23: 30 mL via ORAL
  Filled 2021-04-23: qty 30

## 2021-04-23 NOTE — ED Provider Notes (Signed)
?[facility] EMERGENCY DEPT ?Provider Note ? ? ?CSN: [id number] ?Arrival date & time: 04/23/21  1128 ? ?  ? ?History ? ?No chief complaint on file. ? ? ?Ethan Silva is a 56 y.o. male. ? ? Patient as above with significant medical history as below, including aortis stenosis, cad, ERCP, s/p cholecystectomy who presents to the ED with complaint of abd pain, chills, malaise, chest tightness, body aches.  Nausea.  No vomiting. ? ?Patient accompanied by family.  Reports variable symptoms including URI symptoms over the past 4 to 5 days.  Chills.  Positive sick contacts, no recent travel.  He has been taking his home medications.  No suspicious p.o. intake.  Intermittent chest discomfort, lightheadedness.  No change to bowel or bladder function. ? ? ?Language interpreter services were offered however patient defers to have his son translate ? ? ?Past Medical History: ?07/2012: Aortic stenosis, mild ?    Comment:  Very mild aortic stenosis noted on echo-mean gradient 11 ?             mmHg. ?No date: Bile duct leak ?07/14/2020: CAD in native artery ?    Comment:  Small branch of OM 3 with 80% ostial stenosis.  Too  ?             small for PCI.  Otherwise questionable 30% LM and 30%  ?             PDA. ?2017: Chronic kidney disease ?    Comment:  left kidney stones ?No date: Headache(784.0) ?    Comment:  Hx: of when BP is elevated ?No date: Hyperlipidemia ?No date: Hypertension ?07/13/2020: Non-STEMI (non-ST elevated myocardial infarction) (HCC) ?    Comment:  Admitted with hypertension and chest pain.  Cardiac cath ?             showed small branch of an OM3 with 85% ostial stenosis  ?             not amenable to PCI.  No other significant disease noted. ?             Normal EF on echo.  Medical management. ?No date: Numbness and tingling in hands ?    Comment:  Hx: of ?No date: Prediabetes ? ?Past Surgical History: ?03/16/2021: BILIARY DILATION ?    Comment:  Procedure: BILIARY DILATION;   Surgeon: Rachael Fee, ?             MD;  Location: [facility];  Service: Endoscopy;; ?07/11/2012: CHOLECYSTECTOMY; N/A ?    Comment:  Procedure: LAPAROSCOPIC CHOLECYSTECTOMY WITH  ?             INTRAOPERATIVE CHOLANGIOGRAM;  Surgeon: Axel Filler,  ?             MD;  Location: [facility] OR;  Service: General;  Laterality:  ?             N/A; ?No date: COLONOSCOPY ?03/16/2021: ENDOSCOPIC RETROGRADE CHOLANGIOPANCREATOGRAPHY (ERCP) WITH  ?PROPOFOL; N/A ?    Comment:  Procedure: ENDOSCOPIC RETROGRADE  ?             CHOLANGIOPANCREATOGRAPHY (ERCP) WITH PROPOFOL;  Surgeon:  ?             Rachael Fee, MD;  Location: [facility];  Service:  ?             Endoscopy;  Laterality: N/A; ?07/16/2012: ERCP ?07/16/2012: ERCP; N/A ?    Comment:  Procedure: ENDOSCOPIC RETROGRADE  ?             CHOLANGIOPANCREATOGRAPHY (ERCP);  Surgeon: Barbette Hairobert D  ?             Arlyce DiceKaplan, MD;  [facility];  Service: Gastroenterology; ?             Laterality: N/A; ?10/06/2012: ERCP; N/A ?    Comment:  Procedure: ENDOSCOPIC RETROGRADE  ?             CHOLANGIOPANCREATOGRAPHY (ERCP);  Surgeon: Barbette Hairobert D  ?             Arlyce DiceKaplan, MD;  [facility];  Service: Endoscopy; ?             Laterality: N/A; ?03/16/2021: ESOPHAGOGASTRODUODENOSCOPY; N/A ?    Comment:  Procedure: ESOPHAGOGASTRODUODENOSCOPY (EGD);  Surgeon:  ?             Rachael FeeJacobs, Daniel P, MD;  [facility];  Service:  ?             Endoscopy;  Laterality: N/A; ?03/16/2021: EUS; N/A ?    Comment:  Procedure: UPPER ENDOSCOPIC ULTRASOUND (EUS) RADIAL;   ?             Surgeon: Rachael FeeJacobs, Daniel P, MD;  [facility];   ?             Service: Endoscopy;  Laterality: N/A; ?No date: HAND SURGERY ?07/14/2020: LEFT HEART CATH AND CORONARY ANGIOGRAPHY; N/A ?    Comment:  Procedure: LEFT HEART CATH AND CORONARY ANGIOGRAPHY;   ?             Surgeon: Lyn RecordsSmith, Henry W, MD;  Location: [facility]  ?             LAB;  Service: Cardiovascular; ? Culprit Lesion ~ 80%  ?              ostial small OM3 (too small & ostial for PCI). ~30% mid  ?             LM (at a bend).  LAD with D1 & D2 - normal, RCA minimal  ?             luminal irregularities ~ 30%. PDA ?03/16/2021: SPHINCTEROTOMY ?    Comment:  Procedure: SPHINCTEROTOMY;  Surgeon: Rachael FeeJacobs, Daniel P,  ?             MD;  Location: [facility];  Service: Endoscopy;; ?07/14/2020: TRANSTHORACIC ECHOCARDIOGRAM ?    Comment:  (NSTEMI/Accelerated Hypertension): EF 55 to 60%.  No RWM ?             A.  Very mild Aortic Stenosis-mean gradient 11 to mmHg.  ? ? ?The history is provided by the patient, a relative and the spouse. The history is limited by a language barrier. No language interpreter was used.  ? ?  ? ?Home Medications ?Prior to Admission medications   ?Medication Sig Start Date End Date Taking? Authorizing Provider  ?acetaminophen (TYLENOL) 325 MG tablet Take 325 mg by mouth every 6 (six) hours as needed for moderate pain.    [provider]  ?amLODipine (NORVASC) 5 MG tablet Take 1 tablet (5 mg total) by mouth daily. 02/01/21   Cannon KettleLambert, Jennifer K, PA-C  ?aspirin 81 MG EC tablet Take 1 tablet (81 mg total) by mouth daily. Swallow whole. 07/15/20   O'Neal, Ronnald RampWesley Thomas, MD  ?Bempedoic Acid-Ezetimibe (NEXLIZET) 180-10 MG TABS Take  1 tablet by mouth daily. 04/04/21   Cannon Kettle, PA-C  ?clopidogrel (PLAVIX) 75 MG tablet Take 1 tablet (75 mg total) by mouth daily. 02/01/21   Cannon Kettle, PA-C  ?lisinopril (ZESTRIL) 10 MG tablet Take 1 tablet (10 mg total) by mouth daily. ?Patient taking differently: Take 10 mg by mouth daily as needed (high blood pressure). 02/01/21 05/02/21  Cannon Kettle, PA-C  ?Multiple Vitamins-Minerals (MULTI FOR HIM) TABS Take 1 tablet by mouth daily. 03/31/21   Cannon Kettle, PA-C  ?nitroGLYCERIN (NITROSTAT) 0.4 MG SL tablet Place 1 tablet (0.4 mg total) under the tongue every 5 (five) minutes x 3 doses as needed for chest pain. 07/15/20   O'Neal, Ronnald Ramp, MD  ?ondansetron (ZOFRAN ODT) 4 MG  disintegrating tablet Take 1 tablet (4 mg total) by mouth every 8 (eight) hours as needed for nausea or vomiting. 08/27/20   Pricilla Loveless, MD  ?pantoprazole (PROTONIX) 40 MG tablet Take 1 tablet (40 mg total) by mouth daily. 11/21/20   Cannon Kettle, PA-C  ?polyethylene glycol powder (GLYCOLAX/MIRALAX) 17 GM/SCOOP powder Take 17 g by mouth 2 (two) times daily as needed. 10/11/20   Corwin Levins, MD  ?rosuvastatin (CRESTOR) 40 MG tablet Take 1 tablet (40 mg total) by mouth daily. 02/01/21 05/02/21  Cannon Kettle, PA-C  ?   ? ?Allergies    ?Patient has no known allergies.   ? ?Review of Systems   ?Review of Systems  ?Constitutional:  Positive for fatigue. Negative for chills and fever.  ?HENT:  Positive for congestion. Negative for facial swelling and trouble swallowing.   ?Eyes:  Negative for photophobia and visual disturbance.  ?Respiratory:  Positive for chest tightness. Negative for cough and shortness of breath.   ?Cardiovascular:  Negative for chest pain and palpitations.  ?Gastrointestinal:  Positive for nausea. Negative for abdominal pain and vomiting.  ?Endocrine: Negative for polydipsia and polyuria.  ?Genitourinary:  Negative for difficulty urinating and hematuria.  ?Musculoskeletal:  Positive for arthralgias. Negative for gait problem and joint swelling.  ?Skin:  Negative for pallor and rash.  ?Neurological:  Negative for syncope and headaches.  ?Psychiatric/Behavioral:  Negative for agitation and confusion.   ? ?Physical Exam ?Updated Vital Signs ?BP 126/88   Pulse 75   Temp 99.1 ?F (37.3 ?C)   Resp 17   Ht 5\' 3"  (1.6 m)   Wt 72.6 kg   SpO2 94%   BMI 28.34 kg/m?  ?Physical Exam ?Vitals and nursing note reviewed.  ?Constitutional:   ?   General: He is not in acute distress. ?   Appearance: Normal appearance. He is well-developed. He is not ill-appearing or diaphoretic.  ?HENT:  ?   Head: Normocephalic and atraumatic.  ?   Right Ear: External ear normal.  ?   Left Ear: External ear normal.   ?   Nose: Nose normal.  ?   Mouth/Throat:  ?   Mouth: Mucous membranes are moist.  ?Eyes:  ?   General: No scleral icterus. ?   Extraocular Movements: Extraocular movements intact.  ?   Pupils: Pupils are equal, round

## 2021-04-23 NOTE — ED Triage Notes (Signed)
Pt presents with elevated B/P and symptoms of drowsy, nausea, and dizziness. Pt was placed on a new medication, Nexlizet, 2 weeks ago.  ?

## 2021-04-23 NOTE — Discharge Instructions (Signed)
Follow-up with your primary care doctor for recheck within the next few days.  You do have an enlarged lymph node in your right arm that needs follow-up by your primary care doctor.  It likely will need ultrasound.  Return to the emergency room if you have any worsening symptoms. ?

## 2021-04-23 NOTE — ED Provider Notes (Addendum)
Care was taken over from Dr. Wallace Cullens.  Patient had presented with various complaints that sounded concerning for possible viral type syndrome.  He had some URI symptoms with coughing and some lightheadedness.  He also had had some chest and abdominal pain although he currently denies any of that.  He had an extensive work-up here including a CT scan of the chest abdomen pelvis.  There is no evidence of dissection or other acute abnormality.  He does have an enlarged lymph node in his right axilla.  He has a stable renal artery aneurysm.  His labs are reviewed and are nonconcerning.  His LFTs are mildly elevated but have been elevated in the past.  Troponins are negative.  No ischemic changes on EKG.  He is currently asymptomatic.  I discussed these findings with the patient and his family.  They will have close follow-up with his primary care doctor.  I did advise him that he will need to have a follow-up ultrasound of the lymph node.  Return precautions were given. ?  ?Rolan Bucco, MD ?04/23/21 1941 ? ?  ?Rolan Bucco, MD ?04/23/21 1949 ? ?

## 2021-04-23 NOTE — ED Notes (Signed)
Discharge paperwork given and understood. 

## 2021-04-25 ENCOUNTER — Encounter: Payer: Self-pay | Admitting: Emergency Medicine

## 2021-04-25 ENCOUNTER — Ambulatory Visit (INDEPENDENT_AMBULATORY_CARE_PROVIDER_SITE_OTHER): Payer: 59 | Admitting: Emergency Medicine

## 2021-04-25 VITALS — BP 130/70 | HR 84 | Temp 98.2°F | Ht 63.0 in | Wt 159.4 lb

## 2021-04-25 DIAGNOSIS — R7303 Prediabetes: Secondary | ICD-10-CM

## 2021-04-25 DIAGNOSIS — E785 Hyperlipidemia, unspecified: Secondary | ICD-10-CM

## 2021-04-25 DIAGNOSIS — I25119 Atherosclerotic heart disease of native coronary artery with unspecified angina pectoris: Secondary | ICD-10-CM | POA: Diagnosis not present

## 2021-04-25 DIAGNOSIS — I10 Essential (primary) hypertension: Secondary | ICD-10-CM

## 2021-04-25 DIAGNOSIS — R590 Localized enlarged lymph nodes: Secondary | ICD-10-CM

## 2021-04-25 DIAGNOSIS — I700 Atherosclerosis of aorta: Secondary | ICD-10-CM

## 2021-04-25 DIAGNOSIS — Z09 Encounter for follow-up examination after completed treatment for conditions other than malignant neoplasm: Secondary | ICD-10-CM

## 2021-04-25 NOTE — Assessment & Plan Note (Signed)
Stable.  Continue rosuvastatin 40 mg daily. 

## 2021-04-25 NOTE — Assessment & Plan Note (Signed)
Diet and nutrition discussed.  Advised to decrease amount of daily carbohydrate intake. ?Hemoglobin A1c done today. ?

## 2021-04-25 NOTE — Assessment & Plan Note (Signed)
Axillary ultrasound as recommended by radiologist.  Unknown significance. ?

## 2021-04-25 NOTE — Assessment & Plan Note (Signed)
Stable.  No anginal episodes.  No recent use of nitroglycerin. ?Continue Plavix 75 mg 1 daily baby aspirin ?

## 2021-04-25 NOTE — Assessment & Plan Note (Signed)
Well-controlled.  Continue amlodipine 5 mg and lisinopril 10 mg daily.  ?BP Readings from Last 3 Encounters:  ?04/25/21 130/70  ?04/23/21 126/88  ?03/16/21 (!) 139/96  ? ? ? ?

## 2021-04-25 NOTE — Patient Instructions (Signed)
Mantenimiento de la salud en los hombres Health Maintenance, Male Adoptar un estilo de vida saludable y recibir atencin preventiva son importantes para promover la salud y el bienestar. Consulte al mdico sobre: El esquema adecuado para hacerse pruebas y exmenes peridicos. Cosas que puede hacer por su cuenta para prevenir enfermedades y mantenerse sano. Qu debo saber sobre la dieta, el peso y el ejercicio? Consuma una dieta saludable  Consuma una dieta que incluya muchas verduras, frutas, productos lcteos con bajo contenido de grasa y protenas magras. No consuma muchos alimentos ricos en grasas slidas, azcares agregados o sodio. Mantenga un peso saludable El ndice de masa muscular (IMC) es una medida que puede utilizarse para identificar posibles problemas de peso. Proporciona una estimacin de la grasa corporal basndose en el peso y la altura. Su mdico puede ayudarle a determinar su IMC y a lograr o mantener un peso saludable. Haga ejercicio con regularidad Haga ejercicio con regularidad. Esta es una de las prcticas ms importantes que puede hacer por su salud. La mayora de los adultos deben seguir estas pautas: Realizar, al menos, 150 minutos de actividad fsica por semana. El ejercicio debe aumentar la frecuencia cardaca y hacerlo transpirar (ejercicio de intensidad moderada). Hacer ejercicios de fortalecimiento por lo menos dos veces por semana. Agregue esto a su plan de ejercicio de intensidad moderada. Pase menos tiempo sentado. Incluso la actividad fsica ligera puede ser beneficiosa. Controle sus niveles de colesterol y lpidos en la sangre Comience a realizarse anlisis de lpidos y colesterol en la sangre a los 20 aos y luego reptalos cada 5 aos. Es posible que necesite controlar los niveles de colesterol con mayor frecuencia si: Sus niveles de lpidos y colesterol son altos. Es mayor de 40 aos. Presenta un alto riesgo de padecer enfermedades cardacas. Qu debo  saber sobre las pruebas de deteccin del cncer? Muchos tipos de cncer pueden detectarse de manera temprana y, a menudo, pueden prevenirse. Segn su historia clnica y sus antecedentes familiares, es posible que deba realizarse pruebas de deteccin del cncer en diferentes edades. Esto puede incluir pruebas de deteccin de lo siguiente: Cncer colorrectal. Cncer de prstata. Cncer de piel. Cncer de pulmn. Qu debo saber sobre la enfermedad cardaca, la diabetes y la hipertensin arterial? Presin arterial y enfermedad cardaca La hipertensin arterial causa enfermedades cardacas y aumenta el riesgo de accidente cerebrovascular. Es ms probable que esto se manifieste en las personas que tienen lecturas de presin arterial alta o tienen sobrepeso. Hable con el mdico sobre sus valores de presin arterial deseados. Hgase controlar la presin arterial: Cada 3 a 5 aos si tiene entre 18 y 39 aos. Todos los aos si es mayor de 40 aos. Si tiene entre 65 y 75 aos y es fumador o sola fumar, pregntele al mdico si debe realizarse una prueba de deteccin de aneurisma artico abdominal (AAA) por nica vez. Diabetes Realcese exmenes de deteccin de la diabetes con regularidad. Este anlisis revisa el nivel de azcar en la sangre en ayunas. Hgase las pruebas de deteccin: Cada tres aos despus de los 45 aos de edad si tiene un peso normal y un bajo riesgo de padecer diabetes. Con ms frecuencia y a partir de una edad inferior si tiene sobrepeso o un alto riesgo de padecer diabetes. Qu debo saber sobre la prevencin de infecciones? Hepatitis B Si tiene un riesgo ms alto de contraer hepatitis B, debe someterse a un examen de deteccin de este virus. Hable con el mdico para averiguar si tiene riesgo de   contraer la infeccin por hepatitis B. Hepatitis C Se recomienda un anlisis de sangre para: Todos los que nacieron entre 1945 y 1965. Todas las personas que tengan un riesgo de haber  contrado hepatitis C. Enfermedades de transmisin sexual (ETS) Debe realizarse pruebas de deteccin de ITS todos los aos, incluidas la gonorrea y la clamidia, si: Es sexualmente activo y es menor de 24 aos. Es mayor de 24 aos, y el mdico le informa que corre riesgo de tener este tipo de infecciones. La actividad sexual ha cambiado desde que le hicieron la ltima prueba de deteccin y tiene un riesgo mayor de tener clamidia o gonorrea. Pregntele al mdico si usted tiene riesgo. Pregntele al mdico si usted tiene un alto riesgo de contraer VIH. El mdico tambin puede recomendarle un medicamento recetado para ayudar a evitar la infeccin por el VIH. Si elige tomar medicamentos para prevenir el VIH, primero debe hacerse los anlisis de deteccin del VIH. Luego debe hacerse anlisis cada 3 meses mientras est tomando los medicamentos. Siga estas indicaciones en su casa: Consumo de alcohol No beba alcohol si el mdico se lo prohbe. Si bebe alcohol: Limite la cantidad que consume de 0 a 2 bebidas por da. Sepa cunta cantidad de alcohol hay en las bebidas que toma. En los [place], una medida equivale a una botella de cerveza de 12 oz (355 ml), un vaso de vino de 5 oz (148 ml) o un vaso de una bebida alcohlica de alta graduacin de 1 oz (44 ml). Estilo de vida No consuma ningn producto que contenga nicotina o tabaco. Estos productos incluyen cigarrillos, tabaco para mascar y aparatos de vapeo, como los cigarrillos electrnicos. Si necesita ayuda para dejar de consumir estos productos, consulte al mdico. No consuma drogas. No comparta agujas. Solicite ayuda a su mdico si necesita apoyo o informacin para abandonar las drogas. Indicaciones generales Realcese los estudios de rutina de la salud, dentales y de la vista. Mantngase al da con las vacunas. Infrmele a su mdico si: Se siente deprimido con frecuencia. Alguna vez ha sido vctima de maltrato o no se siente seguro en su  casa. Resumen Adoptar un estilo de vida saludable y recibir atencin preventiva son importantes para promover la salud y el bienestar. Siga las instrucciones del mdico acerca de una dieta saludable, el ejercicio y la realizacin de pruebas o exmenes para detectar enfermedades. Siga las instrucciones del mdico con respecto al control del colesterol y la presin arterial. Esta informacin no tiene como fin reemplazar el consejo del mdico. Asegrese de hacerle al mdico cualquier pregunta que tenga. Document Revised: 05/25/2020 Document Reviewed: 05/25/2020 Elsevier Patient Education  2023 [employer]  

## 2021-04-25 NOTE — Progress Notes (Signed)
Ethan Silva ?56 y.o. ? ? ?Chief Complaint  ?Patient presents with  ? Follow-up  ?  Pt seen at the ER on 04/23/2021 for enlarged lymph node under arm pit   ? ? ?HISTORY OF PRESENT ILLNESS: ?This is a 56 y.o. male recently seen in the emergency department on 04/23/2021, here for follow-up.  Presented with URI symptoms.  Work-up included blood work and CT of chest abdomen pelvis. ?Results reviewed with patient. ?CT scan showed a prominent right axillary lymph node, stable right renal artery aneurysm, coronary artery disease and aortic atherosclerosis.  No acute findings.  Unremarkable blood work results. ?Feeling better today. ?No other complaints or medical concerns today. ?Recent office visits with cardiology and emergency department as follows: ?Assessment/Plan: ?  ?1. Hyperlipidemia - Patient's LDL 80 which is above goal of <70 despite treatment with high intensity statin, Zetia, and lifestyle changes.  Discussed options with patient including continuing lifestyle changes and rechecking in 3 months, starting PCSK9i, or starting Nexletol/Nexlizet.  Patient prefers to try Nexlizet.  Will be a prior authorization which I will complete and contact patient when approved.  Advised he can d/c [facility] at that time [facility]. When he starts medication will recheck lipid panel in 2-3 months.  Patient voiced understanding.   ?  ?Continue rosuvastatin 40mg  daily ?Start Nexlizet 180/10mg  daily ?D/c Zetia once he starts Nexlizet ?Recheck lipid panel in 2-3 months. ?  ?Laural Goldenhris Pavero, PharmD, BCACP, CDCES, CPP ?[address], [address] ?[place], [place], [address] ?Phone: [phone], Fax: [phone]  ?  ? ?ED ?Discharged  ?04/23/2021 (7 hours) ?[facility] ?Rolan BuccoBelfi, Melanie, MD ?Last attending  Treatment team Dizziness +1 more ?Clinical impression  ? ?ED Provider Notes ?Rolan BuccoBelfi, Melanie, MD (Physician)   Emergency Medicine ?Care was taken over from Dr. Wallace CullensGray.   Patient had presented with various complaints that sounded concerning for possible viral type syndrome.  He had some URI symptoms with coughing and some lightheadedness.  He also had had some chest and abdominal pain although he currently denies any of that.  He had an extensive work-up here including a CT scan of the chest abdomen pelvis.  There is no evidence of dissection or other acute abnormality.  He does have an enlarged lymph node in his right axilla.  He has a stable renal artery aneurysm.  His labs are reviewed and are nonconcerning.  His LFTs are mildly elevated but have been elevated in the past.  Troponins are negative.  No ischemic changes on EKG.  He is currently asymptomatic.  I discussed these findings with the patient and his family.  They will have close follow-up with his primary care doctor.  I did advise him that he will need to have a follow-up ultrasound of the lymph node.  Return precautions were given. ?  ?Rolan BuccoBelfi, Melanie, MD ?04/23/21 1941 ?  ?  ?Rolan BuccoBelfi, Melanie, MD ?04/23/21 1949  ?  ? ? ? ?HPI ? ? ?Prior to Admission medications   ?Medication Sig Start Date End Date Taking? Authorizing Provider  ?acetaminophen (TYLENOL) 325 MG tablet Take 325 mg by mouth every 6 (six) hours as needed for moderate pain.   Yes [provider]  ?amLODipine (NORVASC) 5 MG tablet Take 1 tablet (5 mg total) by mouth daily. 02/01/21  Yes Cannon KettleLambert, Jennifer K, PA-C  ?aspirin 81 MG EC tablet Take 1 tablet (81 mg total) by mouth daily. Swallow whole. 07/15/20  Yes O'Neal, Ronnald RampWesley Thomas, MD  ?Bempedoic Acid-Ezetimibe (  NEXLIZET) 180-10 MG TABS Take 1 tablet by mouth daily. 04/04/21  Yes Cannon Kettle, PA-C  ?clopidogrel (PLAVIX) 75 MG tablet Take 1 tablet (75 mg total) by mouth daily. 02/01/21  Yes [name] K, PA-C  ?lisinopril (ZESTRIL) 10 MG tablet Take 1 tablet (10 mg total) by mouth daily. ?Patient taking differently: Take 10 mg by mouth daily as needed (high blood pressure). 02/01/21 05/02/21 Yes  Cannon Kettle, PA-C  ?Multiple Vitamins-Minerals (MULTI FOR HIM) TABS Take 1 tablet by mouth daily. 03/31/21  Yes Cannon Kettle, PA-C  ?nitroGLYCERIN (NITROSTAT) 0.4 MG SL tablet Place 1 tablet (0.4 mg total) under the tongue every 5 (five) minutes x 3 doses as needed for chest pain. 07/15/20  Yes O'Neal, Ronnald Ramp, MD  ?ondansetron (ZOFRAN ODT) 4 MG disintegrating tablet Take 1 tablet (4 mg total) by mouth every 8 (eight) hours as needed for nausea or vomiting. 08/27/20  Yes Pricilla Loveless, MD  ?pantoprazole (PROTONIX) 40 MG tablet Take 1 tablet (40 mg total) by mouth daily. 11/21/20  Yes [name] K, PA-C  ?polyethylene glycol powder (GLYCOLAX/MIRALAX) 17 GM/SCOOP powder Take 17 g by mouth 2 (two) times daily as needed. 10/11/20  Yes Corwin Levins, MD  ?rosuvastatin (CRESTOR) 40 MG tablet Take 1 tablet (40 mg total) by mouth daily. 02/01/21 05/02/21 Yes Cannon Kettle, PA-C  ? ? ?No Known Allergies ? ?Patient Active Problem List  ? Diagnosis Date Noted  ? Transaminitis 10/24/2020  ? Epigastric abdominal pain 10/24/2020  ? Coronary artery disease involving native coronary artery of native heart with angina pectoris (HCC) 07/15/2020  ? Prediabetes 07/15/2020  ? NSTEMI (non-ST elevated myocardial infarction) (HCC) 07/14/2020  ? BMI 30.0-30.9,adult 08/30/2015  ? Hyperlipidemia with target LDL less than 70 08/30/2015  ? Abdominal pain 11/21/2012  ? Essential hypertension 10/11/2012  ? Pneumobilia 07/16/2012  ? ? ?Past Medical History:  ?Diagnosis Date  ? Aortic stenosis, mild 07/2012  ? Very mild aortic stenosis noted on echo-mean gradient 11 mmHg.  ? Bile duct leak   ? CAD in native artery 07/14/2020  ? Small branch of OM 3 with 80% ostial stenosis.  Too small for PCI.  Otherwise questionable 30% LM and 30% PDA.  ? Chronic kidney disease 2017  ? left kidney stones  ? Headache(784.0)   ? Hx: of when BP is elevated  ? Hyperlipidemia   ? Hypertension   ? Non-STEMI (non-ST elevated myocardial  infarction) (HCC) 07/13/2020  ? Admitted with hypertension and chest pain.  Cardiac cath showed small branch of an OM3 with 85% ostial stenosis not amenable to PCI.  No other significant disease noted.  Normal EF on echo.  Medical management.  ? Numbness and tingling in hands   ? Hx: of  ? Prediabetes   ? ? ?Past Surgical History:  ?Procedure Laterality Date  ? BILIARY DILATION  03/16/2021  ? Procedure: BILIARY DILATION;  Surgeon: Rachael Fee, MD;  Location: [facility];  Service: Endoscopy;;  ? CHOLECYSTECTOMY N/A 07/11/2012  ? Procedure: LAPAROSCOPIC CHOLECYSTECTOMY WITH INTRAOPERATIVE CHOLANGIOGRAM;  Surgeon: Axel Filler, MD;  Location: [facility];  Service: General;  Laterality: N/A;  ? COLONOSCOPY    ? ENDOSCOPIC RETROGRADE CHOLANGIOPANCREATOGRAPHY (ERCP) WITH PROPOFOL N/A 03/16/2021  ? Procedure: ENDOSCOPIC RETROGRADE CHOLANGIOPANCREATOGRAPHY (ERCP) WITH PROPOFOL;  Surgeon: Rachael Fee, MD;  Location: [facility];  Service: Endoscopy;  Laterality: N/A;  ? ERCP  07/16/2012  ? ERCP N/A 07/16/2012  ? Procedure: ENDOSCOPIC RETROGRADE CHOLANGIOPANCREATOGRAPHY (ERCP);  Surgeon: Molly Maduro  Rosalio Macadamia, MD;  Location: [facility] OR;  Service: Gastroenterology;  Laterality: N/A;  ? ERCP N/A 10/06/2012  ? Procedure: ENDOSCOPIC RETROGRADE CHOLANGIOPANCREATOGRAPHY (ERCP);  Surgeon: Louis Meckel, MD;  Location: [facility];  Service: Endoscopy;  Laterality: N/A;  ? ESOPHAGOGASTRODUODENOSCOPY N/A 03/16/2021  ? Procedure: ESOPHAGOGASTRODUODENOSCOPY (EGD);  Surgeon: Rachael Fee, MD;  Location: [facility];  Service: Endoscopy;  Laterality: N/A;  ? EUS N/A 03/16/2021  ? Procedure: UPPER ENDOSCOPIC ULTRASOUND (EUS) RADIAL;  Surgeon: Rachael Fee, MD;  Location: [facility];  Service: Endoscopy;  Laterality: N/A;  ? HAND SURGERY    ? LEFT HEART CATH AND CORONARY ANGIOGRAPHY N/A 07/14/2020  ? Procedure: LEFT HEART CATH AND CORONARY ANGIOGRAPHY;  Surgeon: Lyn Records, MD;  Location: [facility] INVASIVE CV LAB;  Service:  Cardiovascular; ? Culprit Lesion ~ 80% ostial small OM3 (too small & ostial for PCI). ~30% mid LM (at a bend).  LAD with D1 & D2 - normal, RCA minimal luminal irregularities ~ 30%. PDA  ? SPHINCTEROTOMY  03/16/2021  ? Procedu

## 2021-05-05 ENCOUNTER — Other Ambulatory Visit: Payer: Self-pay | Admitting: Emergency Medicine

## 2021-05-05 DIAGNOSIS — R590 Localized enlarged lymph nodes: Secondary | ICD-10-CM

## 2021-05-16 ENCOUNTER — Telehealth: Payer: Self-pay | Admitting: Emergency Medicine

## 2021-05-16 ENCOUNTER — Other Ambulatory Visit: Payer: Self-pay | Admitting: Emergency Medicine

## 2021-05-16 DIAGNOSIS — R590 Localized enlarged lymph nodes: Secondary | ICD-10-CM

## 2021-05-16 NOTE — Telephone Encounter (Signed)
Order for [place] and mammogram for patient faxed to [facility] outpatient imaging . [phone]. Patient notified  ?

## 2021-05-16 NOTE — Telephone Encounter (Signed)
PT visits today in regards to getting an appointment set up for the lymph node on his right armpit. PT was told that his insurance would not cover the original appointment set up for him and he was informed to call his insurance. His insurance then gave him the number [phone] to call. He had called this number and was informed that Dr.[name] would have to set up this appointment for him. ? ?CB for [place] : [phone] ? ?CB for PT: [phone] ?

## 2021-05-17 ENCOUNTER — Other Ambulatory Visit: Payer: Self-pay | Admitting: Emergency Medicine

## 2021-05-17 ENCOUNTER — Encounter: Payer: Self-pay | Admitting: *Deleted

## 2021-05-17 DIAGNOSIS — R590 Localized enlarged lymph nodes: Secondary | ICD-10-CM

## 2021-05-17 NOTE — Progress Notes (Signed)
This encounter was created in error - please disregard.

## 2021-06-01 ENCOUNTER — Ambulatory Visit (HOSPITAL_BASED_OUTPATIENT_CLINIC_OR_DEPARTMENT_OTHER)
Admission: RE | Admit: 2021-06-01 | Discharge: 2021-06-01 | Disposition: A | Discharge status: Home / Self Care | Payer: 59 | Source: Ambulatory Visit | Attending: Emergency Medicine | Admitting: Emergency Medicine

## 2021-06-01 DIAGNOSIS — R590 Localized enlarged lymph nodes: Secondary | ICD-10-CM | POA: Diagnosis not present

## 2021-06-26 ENCOUNTER — Other Ambulatory Visit: Payer: Self-pay | Admitting: Emergency Medicine

## 2021-06-26 DIAGNOSIS — R590 Localized enlarged lymph nodes: Secondary | ICD-10-CM

## 2021-07-03 ENCOUNTER — Telehealth: Payer: Self-pay | Admitting: Physician Assistant

## 2021-07-03 NOTE — Telephone Encounter (Signed)
Scheduled appt per 6/26 referral. Used interpreter services. Pt is aware of appt date and time. Pt is aware to arrive 15 mins prior to appt time and to bring and updated insurance card. Pt is aware of appt location.

## 2021-07-10 NOTE — Progress Notes (Unsigned)
[facility] Telephone:([phone]) [phone]   Fax:([phone]) [phone]  INITIAL CONSULTATION:  Patient Care Team: Horald Pollen, MD as PCP - General (Internal Medicine) Leonie Man, MD as PCP - Cardiology (Cardiology) Jacelyn Pi, Lilia Argue, MD (Family Medicine)  CHIEF COMPLAINTS/PURPOSE OF CONSULTATION:  Right axillary lymphadenopathy  HISTORY OF PRESENTING ILLNESS:  Graciella Freer 56 y.o. male with medical history significant for aortic stenosis, coronary artery disease, hypertension, hyperlipidemia peripheral neuropathy and prediabetes.  He is accompanied by her Patent attorney for this visit.  On review of the previous records, Mr. Skillern scented to the emergency room on 04/23/2021 due to some chest and abdominal pain.  CT imaging revealed an enlarged lymph node in his right axilla.  Repeat ultrasound of the right axilla was obtained on 06/01/2021.  Findings revealed persistent right axillary lymphadenopathy measuring 3.6 cm.  On exam today, Mr. Lattanzio reports fatigue but he continues to complete all his ADLs on his own.  He reports that after walking for period of time, he experiences weakness in his legs that requires him to rest.  He denies any recent weight loss or appetite changes.  He reports occasional episodes of nausea with vomiting.  He denies abdominal pain and his bowel habits are unchanged.  Patient denies easy bruising or signs of active bleeding.  He denies fevers, chills, night sweats, shortness of breath, chest pain or cough.  He has no other complaints.  Rest of the 10 point ROS is below.  MEDICAL HISTORY:  Past Medical History:  Diagnosis Date   Aortic stenosis, mild 07/2012   Very mild aortic stenosis noted on echo-mean gradient 11 mmHg.   Bile duct leak    CAD in native artery 07/14/2020   Small branch of OM 3 with 80% ostial stenosis.  Too small for PCI.  Otherwise questionable 30% LM and 30% PDA.    Chronic kidney disease 2017   left kidney stones   Headache(784.0)    Hx: of when BP is elevated   Hyperlipidemia    Hypertension    Non-STEMI (non-ST elevated myocardial infarction) ([place]) 07/13/2020   Admitted with hypertension and chest pain.  Cardiac cath showed small branch of an OM3 with 85% ostial stenosis not amenable to PCI.  No other significant disease noted.  Normal EF on echo.  Medical management.   Numbness and tingling in hands    Hx: of   Prediabetes     SURGICAL HISTORY: Past Surgical History:  Procedure Laterality Date   BILIARY DILATION  03/16/2021   Procedure: BILIARY DILATION;  Surgeon: Milus Banister, MD;  Location: [facility];  Service: Endoscopy;;   CHOLECYSTECTOMY N/A 07/11/2012   Procedure: LAPAROSCOPIC CHOLECYSTECTOMY WITH INTRAOPERATIVE CHOLANGIOGRAM;  Surgeon: Ralene Ok, MD;  Location: [facility];  Service: General;  Laterality: N/A;   COLONOSCOPY     ENDOSCOPIC RETROGRADE CHOLANGIOPANCREATOGRAPHY (ERCP) WITH PROPOFOL N/A 03/16/2021   Procedure: ENDOSCOPIC RETROGRADE CHOLANGIOPANCREATOGRAPHY (ERCP) WITH PROPOFOL;  Surgeon: Milus Banister, MD;  Location: [facility];  Service: Endoscopy;  Laterality: N/A;   ERCP  07/16/2012   ERCP N/A 07/16/2012   Procedure: ENDOSCOPIC RETROGRADE CHOLANGIOPANCREATOGRAPHY (ERCP);  Surgeon: Inda Castle, MD;  Location: [facility];  Service: Gastroenterology;  Laterality: N/A;   ERCP N/A 10/06/2012   Procedure: ENDOSCOPIC RETROGRADE CHOLANGIOPANCREATOGRAPHY (ERCP);  Surgeon: Inda Castle, MD;  Location: [facility];  Service: Endoscopy;  Laterality: N/A;   ESOPHAGOGASTRODUODENOSCOPY N/A 03/16/2021   Procedure: ESOPHAGOGASTRODUODENOSCOPY (EGD);  Surgeon: Milus Banister,  MD;  Location: [facility];  Service: Endoscopy;  Laterality: N/A;   EUS N/A 03/16/2021   Procedure: UPPER ENDOSCOPIC ULTRASOUND (EUS) RADIAL;  Surgeon: Milus Banister, MD;  Location: [facility];  Service: Endoscopy;  Laterality: N/A;   HAND  SURGERY     LEFT HEART CATH AND CORONARY ANGIOGRAPHY N/A 07/14/2020   Procedure: LEFT HEART CATH AND CORONARY ANGIOGRAPHY;  Surgeon: Belva Crome, MD;  Location: [place] CV LAB;  Service: Cardiovascular; ? Culprit Lesion ~ 80% ostial small OM3 (too small & ostial for PCI). ~30% mid LM (at a bend).  LAD with D1 & D2 - normal, RCA minimal luminal irregularities ~ 30%. PDA   SPHINCTEROTOMY  03/16/2021   Procedure: SPHINCTEROTOMY;  Surgeon: Milus Banister, MD;  Location: [facility];  Service: Endoscopy;;   TRANSTHORACIC ECHOCARDIOGRAM  07/14/2020   (NSTEMI/Accelerated Hypertension): EF 55 to 60%.  No RWM A.  Very mild Aortic Stenosis-mean gradient 11 to mmHg.    SOCIAL HISTORY: Social History   Socioeconomic History   Marital status: Married    Spouse name: Alyson Locket   Number of children: 3   Years of education: 6th grade   Highest education level: Not on file  Occupational History   Occupation: Painting    Employer: [employer], [place], [place]    Comment: Mostly indoor painting  Tobacco Use   Smoking status: Never    Passive exposure: Never   Smokeless tobacco: Never  Vaping Use   Vaping Use: Never used  Substance and Sexual Activity   Alcohol use: No   Drug use: No   Sexual activity: Yes    Partners: Female  Other Topics Concern   Not on file  Social History Narrative   Originally from [place], [place]. [place] to the [place] in [place].   Lives with his wife and their youngest son   His daughter lives in [place], [place] with her husband.  Second son is now moved out of the house as well.   He gets a decent amount of exercise walking around at work, but does not do routine exercise.  Does not drink or smoke   Social Determinants of Health   Financial Resource Strain: Not on file  Food Insecurity: Not on file  Transportation Needs: Not on file  Physical Activity: Not on file  Stress: Not on file  Social Connections: Not on file  Intimate Partner Violence:  Not on file    FAMILY HISTORY: Family History  Problem Relation Age of Onset   Gallstones Mother    Hypertension Mother    Gallstones Brother    Diabetes Paternal Uncle     ALLERGIES:  has No Known Allergies.  MEDICATIONS:  Current Outpatient Medications  Medication Sig Dispense Refill   acetaminophen (TYLENOL) 325 MG tablet Take 325 mg by mouth every 6 (six) hours as needed for moderate pain.     amLODipine (NORVASC) 5 MG tablet Take 1 tablet (5 mg total) by mouth daily. 30 tablet 11   aspirin 81 MG EC tablet Take 1 tablet (81 mg total) by mouth daily. Swallow whole. 90 tablet 3   Bempedoic Acid-Ezetimibe (NEXLIZET) 180-10 MG TABS Take 1 tablet by mouth daily. 90 tablet 3   clopidogrel (PLAVIX) 75 MG tablet Take 1 tablet (75 mg total) by mouth daily. 90 tablet 3   Multiple Vitamins-Minerals (MULTI FOR HIM) TABS Take 1 tablet by mouth daily. 100 tablet 3   nitroGLYCERIN (NITROSTAT) 0.4 MG SL tablet Place 1 tablet (0.4  mg total) under the tongue every 5 (five) minutes x 3 doses as needed for chest pain. 25 tablet 1   ondansetron (ZOFRAN ODT) 4 MG disintegrating tablet Take 1 tablet (4 mg total) by mouth every 8 (eight) hours as needed for nausea or vomiting. 10 tablet 0   pantoprazole (PROTONIX) 40 MG tablet Take 1 tablet (40 mg total) by mouth daily. 90 tablet 1   polyethylene glycol powder (GLYCOLAX/MIRALAX) 17 GM/SCOOP powder Take 17 g by mouth 2 (two) times daily as needed. 3350 g 1   lisinopril (ZESTRIL) 10 MG tablet Take 1 tablet (10 mg total) by mouth daily. (Patient taking differently: Take 10 mg by mouth daily as needed (high blood pressure).) 90 tablet 3   rosuvastatin (CRESTOR) 40 MG tablet Take 1 tablet (40 mg total) by mouth daily. 90 tablet 3   No current facility-administered medications for this visit.    REVIEW OF SYSTEMS:   Constitutional: ( - ) fevers, ( - )  chills , ( - ) night sweats Eyes: ( - ) blurriness of vision, ( - ) double vision, ( - ) watery  eyes Ears, nose, mouth, throat, and face: ( - ) mucositis, ( - ) sore throat Respiratory: ( - ) cough, ( - ) dyspnea, ( - ) wheezes Cardiovascular: ( - ) palpitation, ( - ) chest discomfort, ( - ) lower extremity swelling Gastrointestinal:  ( - ) nausea, ( - ) heartburn, ( - ) change in bowel habits Skin: ( - ) abnormal skin rashes Lymphatics: ( - ) new lymphadenopathy, ( - ) easy bruising Neurological: ( - ) numbness, ( - ) tingling, ( - ) new weaknesses Behavioral/Psych: ( - ) mood change, ( - ) new changes  All other systems were reviewed with the patient and are negative.  PHYSICAL EXAMINATION: ECOG PERFORMANCE STATUS: 1 - Symptomatic but completely ambulatory  Vitals:   07/11/21 1204  BP: 132/88  Pulse: 71  Resp: 16  Temp: (!) 97 F (36.1 C)  SpO2: 95%   Filed Weights   07/11/21 1204  Weight: 164 lb 6.4 oz (74.6 kg)    GENERAL: well appearing male in NAD  SKIN: skin color, texture, turgor are normal, no rashes or significant lesions EYES: conjunctiva are pink and non-injected, sclera clear OROPHARYNX: no exudate, no erythema; lips, buccal mucosa, and tongue normal  NECK: supple, non-tender LYMPH:  no palpable lymphadenopathy in the cervical, axillary or supraclavicular lymph nodes.  LUNGS: clear to auscultation and percussion with normal breathing effort HEART: regular rate & rhythm and no murmurs and no lower extremity edema ABDOMEN: soft, non-tender, non-distended, normal bowel sounds Musculoskeletal: no cyanosis of digits and no clubbing  PSYCH: alert & oriented x 3, fluent speech NEURO: no focal motor/sensory deficits  LABORATORY DATA:  I have reviewed the data as listed    Latest Ref Rng & Units 07/11/2021    1:02 PM 04/23/2021   11:52 AM 10/24/2020    9:00 AM  CBC  WBC 4.0 - 10.5 K/uL 7.2  7.7  6.6   Hemoglobin 13.0 - 17.0 g/dL 15.9  17.3  15.8   Hematocrit 39.0 - 52.0 % 45.2  51.2  46.4   Platelets 150 - 400 K/uL 221  219  241.0        Latest Ref Rng  & Units 07/11/2021    1:02 PM 04/23/2021    2:23 PM 04/23/2021   11:52 AM  CMP  Glucose 70 - 99 mg/dL 84  173   BUN 6 - 20 mg/dL 25   27   Creatinine 0.61 - 1.24 mg/dL 1.07   1.29   Sodium 135 - 145 mmol/L 140   139   Potassium 3.5 - 5.1 mmol/L 3.7   3.7   Chloride 98 - 111 mmol/L 107   101   CO2 22 - 32 mmol/L 27   27   Calcium 8.9 - 10.3 mg/dL 9.0   10.1   Total Protein 6.5 - 8.1 g/dL 7.1  8.0    Total Bilirubin 0.3 - 1.2 mg/dL 0.5  0.7    Alkaline Phos 38 - 126 U/L 36  37    AST 15 - 41 U/L 37  146    ALT 0 - 44 U/L 55  164       RADIOGRAPHIC STUDIES: I have personally reviewed the radiological images as listed and agreed with the findings in the report. No results found.  [place] is a 56 y.o. male who presents to the clinic for evaluation for right axillary lymphadenopathy.  We reviewed possible etiologies for lymphadenopathy including infectious process, inflammatory process and lymphoproliferative disorders. Patient will proceed with serologic workup today to check CBC, CMP, LDH, ESR, CRP and Flow cytometry. We will send referral to [facility] to evaluate for excisional biopsy.   #Right axillary biopsy: --Labs today to check CBC, CMP, LDH, ESR, CRP and Flow cytometry. --Referral to [facility] [place] surgery to evaluate for excisional biopsy. If excisional biopsy is not feasible, we will pursue a core needle biopsy.  --Additional imaging will be obtained based on diagnosis.  --Follow up appts will be made after biopsy is scheduled.   Orders Placed This Encounter  Procedures   CBC with Differential ([facility] Only)    Standing Status:   Future    Number of Occurrences:   1    Standing Expiration Date:   07/11/2022   CMP ([facility] only)    Standing Status:   Future    Number of Occurrences:   1    Standing Expiration Date:   07/11/2022   Sedimentation rate    Standing Status:   Future    Number of Occurrences:    1    Standing Expiration Date:   07/10/2022   C-reactive protein    Standing Status:   Future    Number of Occurrences:   1    Standing Expiration Date:   07/10/2022   Lactate dehydrogenase (LDH)    Standing Status:   Future    Number of Occurrences:   1    Standing Expiration Date:   07/10/2022   Flow Cytometry, Peripheral Blood (Oncology)    Standing Status:   Future    Number of Occurrences:   1    Standing Expiration Date:   07/11/2022   Ambulatory referral to General Surgery    Referral Priority:   Urgent    Referral Type:   Surgical    Referral Reason:   Specialty Services Required    Requested Specialty:   General Surgery    Number of Visits Requested:   1    All questions were answered. The patient knows to call the clinic with any problems, questions or concerns.  I have spent a total of 60 minutes minutes of face-to-face and non-face-to-face time, preparing to see the patient, obtaining and/or reviewing separately obtained history, performing a medically appropriate examination, counseling and educating the patient, referring and  communicating with other health care professionals, documenting clinical information in the electronic health record,  and care coordination.   Dede Query, PA-C Department of Hematology/Oncology [place] at [facility] Phone: [phone]  Patient was seen with Dr. Lorenso Courier  I have read the above note and personally examined the patient. I agree with the assessment and plan as noted above.  Briefly Mr. Eldredge Veldhuizen is a 56 year old male who presents for evaluation of prolonged lymphadenopathy in his right axilla.  The enlarged lymph node was previously noted on 04/23/2021 during a CT angiography of the chest, abdomen, and pelvis for abdominal, chest, and back pain.  At that time he was noted to be a 1.4 x 2.6 prominent right axillary lymph node.  Follow-up ultrasound performed on 06/26/2021 showed a new measurement of 3.6  cm.  Due to concern for this chronically enlarged lymph node he was referred to hematology for further evaluation and management.  On my personal exam I was not able to palpate the lymph node, though there is some fullness of the right axilla.  He is not currently having any B symptoms such as fevers, chills, sweats.  Given how long this lymph node has been present I would recommend pursuing an excisional biopsy in order to further evaluate.  The patient voiced understanding of the plan moving forward.   Ledell Peoples, MD Department of Hematology/Oncology [place] at [facility] Phone: [phone] Pager: [phone] Email: [email or web address]

## 2021-07-11 ENCOUNTER — Encounter: Payer: Self-pay | Admitting: Physician Assistant

## 2021-07-11 ENCOUNTER — Inpatient Hospital Stay: Payer: 59

## 2021-07-11 ENCOUNTER — Inpatient Hospital Stay: Payer: 59 | Attending: Physician Assistant | Admitting: Physician Assistant

## 2021-07-11 ENCOUNTER — Other Ambulatory Visit: Payer: Self-pay

## 2021-07-11 VITALS — BP 132/88 | HR 71 | Temp 97.0°F | Resp 16 | Wt 164.4 lb

## 2021-07-11 DIAGNOSIS — R112 Nausea with vomiting, unspecified: Secondary | ICD-10-CM | POA: Insufficient documentation

## 2021-07-11 DIAGNOSIS — R590 Localized enlarged lymph nodes: Secondary | ICD-10-CM

## 2021-07-11 LAB — CBC WITH DIFFERENTIAL (CANCER CENTER ONLY)
Abs Immature Granulocytes: 0.01 10*3/uL (ref 0.00–0.07)
Basophils Absolute: 0 10*3/uL (ref 0.0–0.1)
Basophils Relative: 0 %
Eosinophils Absolute: 0.1 10*3/uL (ref 0.0–0.5)
Eosinophils Relative: 2 %
HCT: 45.2 % (ref 39.0–52.0)
Hemoglobin: 15.9 g/dL (ref 13.0–17.0)
Immature Granulocytes: 0 %
Lymphocytes Relative: 29 %
Lymphs Abs: 2.1 10*3/uL (ref 0.7–4.0)
MCH: 29.9 pg (ref 26.0–34.0)
MCHC: 35.2 g/dL (ref 30.0–36.0)
MCV: 85 fL (ref 80.0–100.0)
Monocytes Absolute: 0.6 10*3/uL (ref 0.1–1.0)
Monocytes Relative: 8 %
Neutro Abs: 4.5 10*3/uL (ref 1.7–7.7)
Neutrophils Relative %: 61 %
Platelet Count: 221 10*3/uL (ref 150–400)
RBC: 5.32 MIL/uL (ref 4.22–5.81)
RDW: 13.5 % (ref 11.5–15.5)
WBC Count: 7.2 10*3/uL (ref 4.0–10.5)
nRBC: 0 % (ref 0.0–0.2)

## 2021-07-11 LAB — CMP (CANCER CENTER ONLY)
ALT: 55 U/L — ABNORMAL HIGH (ref 0–44)
AST: 37 U/L (ref 15–41)
Albumin: 4.3 g/dL (ref 3.5–5.0)
Alkaline Phosphatase: 36 U/L — ABNORMAL LOW (ref 38–126)
Anion gap: 6 (ref 5–15)
BUN: 25 mg/dL — ABNORMAL HIGH (ref 6–20)
CO2: 27 mmol/L (ref 22–32)
Calcium: 9 mg/dL (ref 8.9–10.3)
Chloride: 107 mmol/L (ref 98–111)
Creatinine: 1.07 mg/dL (ref 0.61–1.24)
GFR, Estimated: >60 mL/min (ref >60)
Glucose, Bld: 84 mg/dL (ref 70–99)
Potassium: 3.7 mmol/L (ref 3.5–5.1)
Sodium: 140 mmol/L (ref 135–145)
Total Bilirubin: 0.5 mg/dL (ref 0.3–1.2)
Total Protein: 7.1 g/dL (ref 6.5–8.1)

## 2021-07-11 LAB — C-REACTIVE PROTEIN: CRP: 0.6 mg/dL (ref <1.0)

## 2021-07-11 LAB — SEDIMENTATION RATE: Sed Rate: 0 mm/hr (ref 0–16)

## 2021-07-11 LAB — LACTATE DEHYDROGENASE: LDH: 187 U/L (ref 98–192)

## 2021-07-12 ENCOUNTER — Telehealth: Payer: Self-pay | Admitting: Pharmacist

## 2021-07-12 ENCOUNTER — Telehealth: Payer: Self-pay

## 2021-07-12 DIAGNOSIS — I700 Atherosclerosis of aorta: Secondary | ICD-10-CM

## 2021-07-12 DIAGNOSIS — I25119 Atherosclerotic heart disease of native coronary artery with unspecified angina pectoris: Secondary | ICD-10-CM

## 2021-07-12 DIAGNOSIS — I214 Non-ST elevation (NSTEMI) myocardial infarction: Secondary | ICD-10-CM

## 2021-07-12 LAB — SURGICAL PATHOLOGY

## 2021-07-12 NOTE — Telephone Encounter (Signed)
Please send STAT referral to to [facility] but specially request Dr. Andee Lineman for excisional biopsy.  Referral faxed and confirmation received

## 2021-07-12 NOTE — Telephone Encounter (Signed)
Lipid panel placed

## 2021-07-13 LAB — LIPID PANEL
Chol/HDL Ratio: 2.7 ratio (ref 0.0–5.0)
Cholesterol, Total: 109 mg/dL (ref 100–199)
HDL: 40 mg/dL (ref >39)
LDL Chol Calc (NIH): 50 mg/dL (ref 0–99)
Triglycerides: 102 mg/dL (ref 0–149)
VLDL Cholesterol Cal: 19 mg/dL (ref 5–40)

## 2021-07-13 LAB — FLOW CYTOMETRY

## 2021-07-14 ENCOUNTER — Other Ambulatory Visit: Payer: Self-pay | Admitting: Physician Assistant

## 2021-07-14 DIAGNOSIS — R590 Localized enlarged lymph nodes: Secondary | ICD-10-CM

## 2021-07-14 NOTE — Progress Notes (Unsigned)
Ethan Mor, DO  Leodis Rains D OK for US guided attempt at right axillary node bx or FNA.   [address]

## 2021-07-18 ENCOUNTER — Telehealth: Payer: Self-pay

## 2021-07-18 ENCOUNTER — Encounter: Payer: Self-pay | Admitting: Physician Assistant

## 2021-07-18 NOTE — Progress Notes (Unsigned)
RE: Plavix Received: Today Cannon Kettle, PA-C  Roberts Gaudy A, CMA Stop Plavix now patient is 1 year out from MI.    Continue aspirin 81mg  daily.     I will document this in a separate note as well.   PA-C        Previous Messages    ----- Message -----  From: [name] D  Sent: 07/14/2021   1:56 PM EDT  To: 07/16/2021, PA-C  Subject: Plavix                                         Please advise how long pt needs to HOLD Plavix prior to BX on 7/24??   Thanks  8/24

## 2021-07-18 NOTE — Progress Notes (Signed)
Patient asks about stopping Plavix prior to procedure.    Patient is now >12 months post NSTEMI.  Left heart cath 07/14/2020: ? Culprit Lesion ~ 80% ostial small OM3 (too small & ostial for PCI). ~30% mid LM (at a bend).  LAD with D1 & D2 - normal, RCA minimal luminal irregularities ~ 30% PDA .  Recommendations --stop Plavix as he has completed 12 months of DAPT.  Continue aspirin 81 mg daily.

## 2021-07-18 NOTE — Telephone Encounter (Addendum)
Called patient to advise per [name] patient can stop Plavix and continue Aspirin----- Message from Cannon Kettle, PA-C sent at 07/18/2021 11:11 AM EDT ----- Regarding: RE: Plavix Stop Plavix now patient is 1 year out from MI.   Continue aspirin 81mg  daily.    I will document this in a separate note as well.  PA-C  ----- Message ----- From: [name] D Sent: 07/14/2021   1:56 PM EDT To: 07/16/2021, PA-C Subject: Plavix                                         Please advise how long pt needs to HOLD Plavix prior to BX on 7/24??  Thanks 8/24

## 2021-07-21 ENCOUNTER — Encounter (HOSPITAL_COMMUNITY): Payer: Self-pay | Admitting: *Deleted

## 2021-07-21 ENCOUNTER — Other Ambulatory Visit (HOSPITAL_COMMUNITY): Payer: Self-pay | Admitting: Physician Assistant

## 2021-07-21 NOTE — Progress Notes (Signed)
Phone call to pt with interpreter service phone line. Advised pt of pre-procedure instructions. Advised pt not to take blood thinners, NPO after 7am, driver needed.

## 2021-07-24 ENCOUNTER — Other Ambulatory Visit: Payer: Self-pay

## 2021-07-24 ENCOUNTER — Ambulatory Visit (HOSPITAL_COMMUNITY)
Admission: RE | Admit: 2021-07-24 | Discharge: 2021-07-24 | Disposition: A | Discharge status: Home / Self Care | Payer: 59 | Source: Ambulatory Visit | Attending: Physician Assistant | Admitting: Physician Assistant

## 2021-07-24 DIAGNOSIS — I2510 Atherosclerotic heart disease of native coronary artery without angina pectoris: Secondary | ICD-10-CM | POA: Insufficient documentation

## 2021-07-24 DIAGNOSIS — I10 Essential (primary) hypertension: Secondary | ICD-10-CM | POA: Diagnosis not present

## 2021-07-24 DIAGNOSIS — G8929 Other chronic pain: Secondary | ICD-10-CM | POA: Diagnosis not present

## 2021-07-24 DIAGNOSIS — I252 Old myocardial infarction: Secondary | ICD-10-CM | POA: Insufficient documentation

## 2021-07-24 DIAGNOSIS — M25562 Pain in left knee: Secondary | ICD-10-CM | POA: Insufficient documentation

## 2021-07-24 DIAGNOSIS — R590 Localized enlarged lymph nodes: Secondary | ICD-10-CM | POA: Insufficient documentation

## 2021-07-24 DIAGNOSIS — E785 Hyperlipidemia, unspecified: Secondary | ICD-10-CM | POA: Insufficient documentation

## 2021-07-24 MED ORDER — SODIUM CHLORIDE 0.9 % IV SOLN: INTRAVENOUS | Status: DC

## 2021-07-24 MED ORDER — LIDOCAINE HCL (PF) 1 % IJ SOLN
INTRAMUSCULAR | Status: AC
  Filled 2021-07-24: qty 30

## 2021-07-24 NOTE — Procedures (Signed)
Interventional Radiology Procedure Note  Procedure: US Guided Biopsy of right axillary LN  Complications: None  Estimated Blood Loss: < 10 mL  Findings: 16 G core biopsy of right axillary LN performed under US guidance.  Four core samples obtained and sent to Pathology.  Jodi Marble. Fredia Sorrow, M.D Pager:  [phone]

## 2021-07-24 NOTE — H&P (Signed)
Chief Complaint: Right axillary lymphadenopathy. Request is for right axillary lymph node biopsy.   Referring Physician(s): Lincoln Brigham  Supervising Physician: Aletta Edouard  Patient Status: [facility] - Out-pt  History of Present Illness: Ethan Silva is a 56 y.o. male Spanish speaking.Outpatient History of aortic stenosis, CAD, STEMI, HTN, HLD,. Found to have persistent right axillary lymphadenopathy. Team is requesting right axially lymph node biopsy for further evaluation.   Patient seen at bedside with [place] interpreter present.. Patient alert and laying in bed, calm and comfortable.  Endorses left knee pain that is chronic in nature Denies any fevers, headache, chest pain, SOB, cough, abdominal pain, nausea, vomiting or bleeding.    Past Medical History:  Diagnosis Date   Aortic stenosis, mild 07/2012   Very mild aortic stenosis noted on echo-mean gradient 11 mmHg.   Bile duct leak    CAD in native artery 07/14/2020   Small branch of OM 3 with 80% ostial stenosis.  Too small for PCI.  Otherwise questionable 30% LM and 30% PDA.   Chronic kidney disease 2017   left kidney stones   Headache(784.0)    Hx: of when BP is elevated   Hyperlipidemia    Hypertension    Non-STEMI (non-ST elevated myocardial infarction) ([place]) 07/13/2020   Admitted with hypertension and chest pain.  Cardiac cath showed small branch of an OM3 with 85% ostial stenosis not amenable to PCI.  No other significant disease noted.  Normal EF on echo.  Medical management.   Numbness and tingling in hands    Hx: of   Prediabetes     Past Surgical History:  Procedure Laterality Date   BILIARY DILATION  03/16/2021   Procedure: BILIARY DILATION;  Surgeon: Milus Banister, MD;  Location: [facility];  Service: Endoscopy;;   CHOLECYSTECTOMY N/A 07/11/2012   Procedure: LAPAROSCOPIC CHOLECYSTECTOMY WITH INTRAOPERATIVE CHOLANGIOGRAM;  Surgeon: Ralene Ok, MD;  Location: [facility];  Service:  General;  Laterality: N/A;   COLONOSCOPY     ENDOSCOPIC RETROGRADE CHOLANGIOPANCREATOGRAPHY (ERCP) WITH PROPOFOL N/A 03/16/2021   Procedure: ENDOSCOPIC RETROGRADE CHOLANGIOPANCREATOGRAPHY (ERCP) WITH PROPOFOL;  Surgeon: Milus Banister, MD;  Location: [facility];  Service: Endoscopy;  Laterality: N/A;   ERCP  07/16/2012   ERCP N/A 07/16/2012   Procedure: ENDOSCOPIC RETROGRADE CHOLANGIOPANCREATOGRAPHY (ERCP);  Surgeon: Inda Castle, MD;  Location: [facility];  Service: Gastroenterology;  Laterality: N/A;   ERCP N/A 10/06/2012   Procedure: ENDOSCOPIC RETROGRADE CHOLANGIOPANCREATOGRAPHY (ERCP);  Surgeon: Inda Castle, MD;  Location: [facility];  Service: Endoscopy;  Laterality: N/A;   ESOPHAGOGASTRODUODENOSCOPY N/A 03/16/2021   Procedure: ESOPHAGOGASTRODUODENOSCOPY (EGD);  Surgeon: Milus Banister, MD;  Location: [facility];  Service: Endoscopy;  Laterality: N/A;   EUS N/A 03/16/2021   Procedure: UPPER ENDOSCOPIC ULTRASOUND (EUS) RADIAL;  Surgeon: Milus Banister, MD;  Location: [facility];  Service: Endoscopy;  Laterality: N/A;   HAND SURGERY     LEFT HEART CATH AND CORONARY ANGIOGRAPHY N/A 07/14/2020   Procedure: LEFT HEART CATH AND CORONARY ANGIOGRAPHY;  Surgeon: Belva Crome, MD;  Location: [facility];  Service: Cardiovascular; ? Culprit Lesion ~ 80% ostial small OM3 (too small & ostial for PCI). ~30% mid LM (at a bend).  LAD with D1 & D2 - normal, RCA minimal luminal irregularities ~ 30%. PDA   SPHINCTEROTOMY  03/16/2021   Procedure: SPHINCTEROTOMY;  Surgeon: Milus Banister, MD;  Location: [facility];  Service: Endoscopy;;   TRANSTHORACIC ECHOCARDIOGRAM  07/14/2020   (NSTEMI/Accelerated Hypertension):  EF 55 to 60%.  No RWM A.  Very mild Aortic Stenosis-mean gradient 11 to mmHg.    Allergies: Patient has no known allergies.  Medications: Prior to Admission medications   Medication Sig Start Date End Date Taking? Authorizing Provider  acetaminophen (TYLENOL) 325 MG  tablet Take 325 mg by mouth every 6 (six) hours as needed for moderate pain.    [provider]  amLODipine (NORVASC) 5 MG tablet Take 1 tablet (5 mg total) by mouth daily. 02/01/21   Warren Lacy, PA-C  aspirin 81 MG EC tablet Take 1 tablet (81 mg total) by mouth daily. Swallow whole. 07/15/20   O'NealCassie Freer, MD  Bempedoic Acid-Ezetimibe (NEXLIZET) 180-10 MG TABS Take 1 tablet by mouth daily. 04/04/21   Warren Lacy, PA-C  clopidogrel (PLAVIX) 75 MG tablet Take 1 tablet (75 mg total) by mouth daily. 02/01/21   Warren Lacy, PA-C  lisinopril (ZESTRIL) 10 MG tablet Take 1 tablet (10 mg total) by mouth daily. Patient taking differently: Take 10 mg by mouth daily as needed (high blood pressure). 02/01/21 05/02/21  Warren Lacy, PA-C  Multiple Vitamins-Minerals (MULTI FOR HIM) TABS Take 1 tablet by mouth daily. 03/31/21   Warren Lacy, PA-C  nitroGLYCERIN (NITROSTAT) 0.4 MG SL tablet Place 1 tablet (0.4 mg total) under the tongue every 5 (five) minutes x 3 doses as needed for chest pain. 07/15/20   Geralynn Rile, MD  ondansetron (ZOFRAN ODT) 4 MG disintegrating tablet Take 1 tablet (4 mg total) by mouth every 8 (eight) hours as needed for nausea or vomiting. 08/27/20   Sherwood Gambler, MD  pantoprazole (PROTONIX) 40 MG tablet Take 1 tablet (40 mg total) by mouth daily. 11/21/20   Warren Lacy, PA-C  polyethylene glycol powder (GLYCOLAX/MIRALAX) 17 GM/SCOOP powder Take 17 g by mouth 2 (two) times daily as needed. 10/11/20   Biagio Borg, MD  rosuvastatin (CRESTOR) 40 MG tablet Take 1 tablet (40 mg total) by mouth daily. 02/01/21 05/02/21  Warren Lacy, PA-C     Family History  Problem Relation Age of Onset   Gallstones Mother    Hypertension Mother    Gallstones Brother    Diabetes Paternal Uncle     Social History   Socioeconomic History   Marital status: Married    Spouse name: Alyson Locket   Number of children: 3   Years of education:  6th grade   Highest education level: Not on file  Occupational History   Occupation: Painting    Employer: [employer], [place], [place]    Comment: Mostly indoor painting  Tobacco Use   Smoking status: Never    Passive exposure: Never   Smokeless tobacco: Never  Vaping Use   Vaping Use: Never used  Substance and Sexual Activity   Alcohol use: No   Drug use: No   Sexual activity: Yes    Partners: Female  Other Topics Concern   Not on file  Social History Narrative   Originally from [place], [place]. [place] to the [place] in [place].   Lives with his wife and their youngest son   His daughter lives in [place], [place] with her husband.  Second son is now moved out of the house as well.   He gets a decent amount of exercise walking around at work, but does not do routine exercise.  Does not drink or smoke   Social Determinants of Health   Financial Resource Strain: Not on file  Food Insecurity: Not on file  Transportation Needs: Not on file  Physical Activity: Not on file  Stress: Not on file  Social Connections: Not on file     Review of Systems: A 12 point ROS discussed and pertinent positives are indicated in the HPI above.  All other systems are negative.  Review of Systems  Constitutional:  Negative for fever.  HENT:  Negative for congestion.   Respiratory:  Negative for cough and shortness of breath.   Cardiovascular:  Negative for chest pain.  Gastrointestinal:  Negative for abdominal pain.  Musculoskeletal:  Positive for arthralgias (left knee pain).  Neurological:  Negative for headaches.  Psychiatric/Behavioral:  Negative for behavioral problems and confusion.     Vital Signs: BP (!) 151/98   Pulse 63   Temp 97.8 F (36.6 C)   Resp 18   Ht 5\' 3"  (1.6 m)   Wt 163 lb (73.9 kg)   SpO2 96%   BMI 28.87 kg/m     Physical Exam Vitals and nursing note reviewed.  Constitutional:      Appearance: He is well-developed. He is obese.  HENT:      Head: Normocephalic.  Cardiovascular:     Rate and Rhythm: Normal rate and regular rhythm.     Heart sounds: Normal heart sounds.  Pulmonary:     Effort: Pulmonary effort is normal.     Breath sounds: Normal breath sounds.  Musculoskeletal:        General: Normal range of motion.     Cervical back: Normal range of motion.  Skin:    General: Skin is dry.  Neurological:     Mental Status: He is alert and oriented to person, place, and time.     Imaging: No results found.  Labs:  CBC: Recent Labs    10/12/20 0813 10/24/20 0900 04/23/21 1152 07/11/21 1302  WBC 6.9 6.6 7.7 7.2  HGB 15.6 15.8 17.3* 15.9  HCT 46.6 46.4 51.2 45.2  PLT 236.0 241.0 219 221    COAGS: Recent Labs    08/27/20 0218 10/12/20 1625  INR 1.1 0.9    BMP: Recent Labs    08/26/20 2243 08/30/20 0701 09/13/20 1137 10/12/20 0813 10/24/20 0900 04/23/21 1152 07/11/21 1302  NA 140 139   < > 140 140 139 140  K 3.4* 3.7   < > 4.1 3.5 3.7 3.7  CL 102 106   < > 103 103 101 107  CO2 27 24   < > 29 29 27 27   GLUCOSE 165* 111*   < > 107* 111* 173* 84  BUN 19 16   < > 20 21 27* 25*  CALCIUM 8.9 8.8*   < > 9.7 9.3 10.1 9.0  CREATININE 1.39* 1.00   < > 0.88 0.87 1.29* 1.07  GFRNONAA 59* >60  --   --   --  >60 >60   < > = values in this interval not displayed.    LIVER FUNCTION TESTS: Recent Labs    10/24/20 0900 01/10/21 1107 04/23/21 1423 07/11/21 1302  BILITOT 0.7 0.5 0.7 0.5  AST 27 28 146* 37  ALT 57* 41 164* 55*  ALKPHOS 100 38* 37* 36*  PROT 7.5 7.2 8.0 7.1  ALBUMIN 4.4 4.4 4.9 4.3     Assessment and Plan:  56 y.o. Spanish speaking male. Outpatient History of aortic stenosis, CAD, STEMI, HTN, HLD. Found to have persistent right axillary lymphadenopathy. Team is requesting right axially lymph node biopsy for  further evaluation.   Patient is on 81 mg of ASA and plavix Labs from 7.11.23 unremarkable. NKDA. Patient has been NPO since midnight but requests local anesthesia.  Risks  and benefits of right axillary lymph node biopsy was discussed with the patient and/or patient's family including, but not limited to bleeding, infection, damage to adjacent structures or low yield requiring additional tests.  All of the questions were answered and there is agreement to proceed.  Consent signed and in chart.  Thank you for this interesting consult.  I greatly enjoyed meeting [facility] [place] and look forward to participating in their care.  A copy of this report was sent to the requesting provider on this date.  Electronically Signed: Alene Mires, NP 07/24/2021, 1:03 PM   I spent a total of  30 Minutes   in face to face in clinical consultation, greater than 50% of which was counseling/coordinating care for right axillary lymph node biopsy.

## 2021-07-28 LAB — SURGICAL PATHOLOGY

## 2021-07-31 ENCOUNTER — Telehealth: Payer: Self-pay | Admitting: Physician Assistant

## 2021-07-31 NOTE — Telephone Encounter (Signed)
I spoke to [name] to review the biopsy results form 07/24/2021. The core needle biopsy of the enlarged right axillary lymph nodes was negative for malignancy. Serologic workup from 07/11/2021 did not indicate any underlying lymphoproliferative process. Recommend to monitor for now with PCP. If lymph node continues to grow or new areas develop, we will evaluate for repeat biopsy or excisional biopsy. We were unable to perform excisional biopsy as his insurance was not accepted at [facility] or [facility].    Patient expressed understanding of the plan provided. This conversation was conducted with a Engineer, structural.

## 2021-08-12 ENCOUNTER — Other Ambulatory Visit: Payer: Self-pay

## 2021-08-12 ENCOUNTER — Emergency Department (HOSPITAL_BASED_OUTPATIENT_CLINIC_OR_DEPARTMENT_OTHER)
Admission: EM | Admit: 2021-08-12 | Discharge: 2021-08-12 | Disposition: A | Discharge status: Home / Self Care | Payer: 59 | Attending: Emergency Medicine | Admitting: Emergency Medicine

## 2021-08-12 ENCOUNTER — Encounter (HOSPITAL_BASED_OUTPATIENT_CLINIC_OR_DEPARTMENT_OTHER): Payer: Self-pay | Admitting: Emergency Medicine

## 2021-08-12 DIAGNOSIS — Z7982 Long term (current) use of aspirin: Secondary | ICD-10-CM | POA: Diagnosis not present

## 2021-08-12 DIAGNOSIS — Y9389 Activity, other specified: Secondary | ICD-10-CM | POA: Diagnosis not present

## 2021-08-12 DIAGNOSIS — M7042 Prepatellar bursitis, left knee: Secondary | ICD-10-CM | POA: Insufficient documentation

## 2021-08-12 DIAGNOSIS — M25562 Pain in left knee: Secondary | ICD-10-CM | POA: Diagnosis present

## 2021-08-12 MED ORDER — HYDROCODONE-ACETAMINOPHEN 5-325 MG PO TABS
1.0000 | ORAL_TABLET | Freq: Once | ORAL | Status: AC
  Administered 2021-08-12: 1 via ORAL
  Filled 2021-08-12: qty 1

## 2021-08-12 MED ORDER — HYDROCODONE-ACETAMINOPHEN 5-325 MG PO TABS: 1.0000 | ORAL_TABLET | Freq: Four times a day (QID) | ORAL | 0 refills | Status: DC | PRN

## 2021-08-12 MED ORDER — KETOROLAC TROMETHAMINE 30 MG/ML IJ SOLN
30.0000 mg | Freq: Once | INTRAMUSCULAR | Status: AC
  Administered 2021-08-12: 30 mg via INTRAMUSCULAR
  Filled 2021-08-12: qty 1

## 2021-08-12 MED ORDER — KETOROLAC TROMETHAMINE 30 MG/ML IJ SOLN: 30.0000 mg | Freq: Once | INTRAMUSCULAR | Status: DC

## 2021-08-12 NOTE — ED Provider Notes (Signed)
[facility] EMERGENCY DEPT  Provider Note  CSN: [id number] Arrival date & time: 08/12/21 2951  History Chief Complaint  Patient presents with   Knee Pain   History obtained with family at bedside assisting with translation.,  Ethan Silva is a 56 y.o. 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 He reports 2 days of increasing L knee pain, no falls or injuries. Previously his pain was more posterior but today it is more anterior. Worse with movement.    Home Medications Prior to Admission medications   Medication Sig Start Date End Date Taking? Authorizing Provider  HYDROcodone-acetaminophen (NORCO/VICODIN) 5-325 MG tablet Take 1 tablet by mouth every 6 (six) hours as needed for severe pain. 08/12/21  Yes Pollyann Savoy, MD  acetaminophen (TYLENOL) 325 MG tablet Take 325 mg by mouth every 6 (six) hours as needed for moderate pain.    [provider]  amLODipine (NORVASC) 5 MG tablet Take 1 tablet (5 mg total) by mouth daily. 02/01/21   Cannon Kettle, PA-C  aspirin 81 MG EC tablet Take 1 tablet (81 mg total) by mouth daily. Swallow whole. 07/15/20   O'NealRonnald Ramp, MD  Bempedoic Acid-Ezetimibe (NEXLIZET) 180-10 MG TABS Take 1 tablet by mouth daily. 04/04/21   Cannon Kettle, PA-C  clopidogrel (PLAVIX) 75 MG tablet Take 1 tablet (75 mg total) by mouth daily. 02/01/21   Cannon Kettle, PA-C  lisinopril (ZESTRIL) 10 MG tablet Take 1 tablet (10 mg total) by mouth daily. Patient taking differently: Take 10 mg by mouth daily as needed (high blood pressure). 02/01/21 07/24/21  Cannon Kettle, PA-C  Multiple Vitamins-Minerals (MULTI FOR HIM) TABS Take 1 tablet by mouth daily. 03/31/21   Cannon Kettle, PA-C  nitroGLYCERIN (NITROSTAT) 0.4 MG SL tablet Place 1 tablet (0.4 mg total) under  the tongue every 5 (five) minutes x 3 doses as needed for chest pain. 07/15/20   Sande Rives, MD  ondansetron (ZOFRAN ODT) 4 MG disintegrating tablet Take 1 tablet (4 mg total) by mouth every 8 (eight) hours as needed for nausea or vomiting. 08/27/20   Pricilla Loveless, MD  pantoprazole (PROTONIX) 40 MG tablet Take 1 tablet (40 mg total) by mouth daily. 11/21/20   Cannon Kettle, PA-C  polyethylene glycol powder (GLYCOLAX/MIRALAX) 17 GM/SCOOP powder Take 17 g by mouth 2 (two) times daily as needed. 10/11/20   Corwin Levins, MD  rosuvastatin (CRESTOR) 40 MG tablet Take 1 tablet (40 mg total) by mouth daily. 02/01/21 07/24/21  Cannon Kettle, PA-C     Allergies    Patient has no known allergies.   Review of Systems   Review of Systems Please see HPI for pertinent positives and negatives  Physical Exam BP (!) 159/95 (BP Location: Right Arm)   Pulse 63   Temp 98.1 F (36.7 C) (Oral)   Resp 18   Ht 5\' 3"  (1.6 m)   Wt 73.9 kg   SpO2 95%   BMI 28.87 kg/m   Physical Exam Vitals and nursing note reviewed.  HENT:     Head: Normocephalic.     Nose: Nose normal.  Eyes:     Extraocular Movements: Extraocular movements intact.  Cardiovascular:     Pulses: Normal pulses.  Pulmonary:     Effort: Pulmonary effort is normal.  Musculoskeletal:        General: Tenderness (L anterior knee) present. No swelling or deformity.     Cervical back: Neck supple.     Comments: Patient unable to bend R knee actively, but no issues with passive ROM.   Skin:    Findings: No rash (on exposed skin).  Neurological:     Mental Status: He is alert and oriented to person, place, and time.  Psychiatric:        Mood and Affect: Mood normal.     ED Results / Procedures / Treatments   EKG None  Procedures Procedures  Medications Ordered in the ED Medications  ketorolac (TORADOL) 30 MG/ML injection 30 mg (has no administration in time range)  HYDROcodone-acetaminophen (NORCO/VICODIN)  5-325 MG per tablet 1 tablet (has no administration in time range)  ketorolac (TORADOL) 30 MG/ML injection 30 mg (30 mg Intramuscular Given 08/12/21 0537)    Initial Impression and Plan  Patient with atraumatic L anterior knee pain. Suspect this is a bursitis. Plan IM Toradol and knee brace. Recommend outpatient Ortho follow up.   ED Course       MDM Rules/Calculators/A&P Medical Decision Making Problems Addressed: Prepatellar bursitis of left knee: acute illness or injury  Risk Prescription drug management.    Final Clinical Impression(s) / ED Diagnoses Final diagnoses:  Prepatellar bursitis of left knee    Rx / DC Orders ED Discharge Orders          Ordered    HYDROcodone-acetaminophen (NORCO/VICODIN) 5-325 MG tablet  Every 6 hours PRN        08/12/21 0539             Pollyann Savoy, MD 08/12/21 [phone]

## 2021-08-12 NOTE — ED Triage Notes (Signed)
  Patient comes in with L knee pain that started Thursday.  Patient family states that he woke up on Thursday with pain and has not got better.  Patient states L knee locked up and he is unable to bend it.  Pain 10/10 when ambulating.

## 2021-08-17 ENCOUNTER — Encounter (HOSPITAL_BASED_OUTPATIENT_CLINIC_OR_DEPARTMENT_OTHER): Payer: Self-pay

## 2021-08-17 ENCOUNTER — Other Ambulatory Visit: Payer: Self-pay

## 2021-08-17 ENCOUNTER — Emergency Department (HOSPITAL_BASED_OUTPATIENT_CLINIC_OR_DEPARTMENT_OTHER): Payer: Commercial Managed Care - HMO | Admitting: Radiology

## 2021-08-17 ENCOUNTER — Emergency Department (HOSPITAL_BASED_OUTPATIENT_CLINIC_OR_DEPARTMENT_OTHER)
Admission: EM | Admit: 2021-08-17 | Discharge: 2021-08-18 | Disposition: A | Discharge status: Home / Self Care | Payer: Commercial Managed Care - HMO | Attending: Emergency Medicine | Admitting: Emergency Medicine

## 2021-08-17 DIAGNOSIS — T380X5A Adverse effect of glucocorticoids and synthetic analogues, initial encounter: Secondary | ICD-10-CM | POA: Diagnosis not present

## 2021-08-17 DIAGNOSIS — I10 Essential (primary) hypertension: Secondary | ICD-10-CM | POA: Diagnosis not present

## 2021-08-17 DIAGNOSIS — Z79899 Other long term (current) drug therapy: Secondary | ICD-10-CM | POA: Diagnosis not present

## 2021-08-17 DIAGNOSIS — R079 Chest pain, unspecified: Secondary | ICD-10-CM | POA: Insufficient documentation

## 2021-08-17 DIAGNOSIS — Z7982 Long term (current) use of aspirin: Secondary | ICD-10-CM | POA: Insufficient documentation

## 2021-08-17 DIAGNOSIS — I2510 Atherosclerotic heart disease of native coronary artery without angina pectoris: Secondary | ICD-10-CM | POA: Insufficient documentation

## 2021-08-17 NOTE — ED Provider Notes (Signed)
[facility] EMERGENCY DEPT  Provider Note  CSN: [id number] Arrival date & time: 08/17/21 2313  History Chief Complaint  Patient presents with   Chest Pain    Ethan Silva is a 56 y.o. 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 He was seen in this ED about 5 days ago for L knee pain, given pain meds and a knee brace and follow up with Ortho who placed an ACE wrap for more compression and started a course of medrol. After his first dose yesterday he began to have headache, nausea, chest discomfort and HTN. He felt the same symptoms after taking the medication today prompting his ED visit. He took some old NTG with minimal improvement.   Home Medications Prior to Admission medications   Medication Sig Start Date End Date Taking? Authorizing Provider  acetaminophen (TYLENOL) 325 MG tablet Take 325 mg by mouth every 6 (six) hours as needed for moderate pain.    [provider]  amLODipine (NORVASC) 5 MG tablet Take 1 tablet (5 mg total) by mouth daily. 02/01/21   Cannon Kettle, PA-C  aspirin 81 MG EC tablet Take 1 tablet (81 mg total) by mouth daily. Swallow whole. 07/15/20   O'NealRonnald Ramp, MD  Bempedoic Acid-Ezetimibe (NEXLIZET) 180-10 MG TABS Take 1 tablet by mouth daily. 04/04/21   Cannon Kettle, PA-C  clopidogrel (PLAVIX) 75 MG tablet Take 1 tablet (75 mg total) by mouth daily. 02/01/21   Cannon Kettle, PA-C  HYDROcodone-acetaminophen (NORCO/VICODIN) 5-325 MG tablet Take 1 tablet by mouth every 6 (six) hours as needed for severe pain. 08/12/21   Pollyann Savoy, MD  lisinopril (ZESTRIL) 10 MG tablet Take 1 tablet (10 mg total) by mouth daily. Patient taking differently: Take 10 mg by mouth daily as needed (high blood pressure). 02/01/21 07/24/21  Cannon Kettle, PA-C  Multiple Vitamins-Minerals (MULTI FOR HIM) TABS Take 1 tablet by mouth daily. 03/31/21   Cannon Kettle, PA-C  nitroGLYCERIN (NITROSTAT) 0.4 MG SL tablet Place 1 tablet (0.4 mg total) under the tongue every 5 (five) minutes x 3 doses as needed for chest pain. 07/15/20   Sande Rives, MD  ondansetron (ZOFRAN ODT) 4 MG disintegrating tablet Take 1 tablet (4 mg total) by mouth every 8 (eight) hours as needed for nausea or vomiting. 08/27/20   Pricilla Loveless, MD  pantoprazole (PROTONIX) 40 MG tablet Take 1 tablet (40 mg total) by mouth daily. 11/21/20   Cannon Kettle, PA-C  polyethylene glycol powder (GLYCOLAX/MIRALAX) 17 GM/SCOOP powder Take 17 g by mouth 2 (two) times daily as needed. 10/11/20   Corwin Levins, MD  rosuvastatin (CRESTOR) 40 MG tablet Take 1 tablet (40 mg total) by mouth daily. 02/01/21 07/24/21  Cannon Kettle, PA-C     Allergies    Patient has no known allergies.   Review of Systems   Review of Systems Please see HPI for pertinent positives and negatives  Physical Exam BP (!) 135/92   Pulse 66   Temp 98.7 F (37.1 C) (Oral)   Resp 17   Ht 5\' 3"  (1.6 m)   Wt 74.4 kg   SpO2 96%   BMI 29.05 kg/m   Physical Exam Vitals and nursing note reviewed.  Constitutional:      Appearance: Normal appearance.  HENT:     Head: Normocephalic and atraumatic.     Nose: Nose normal.     Mouth/Throat:     Mouth: Mucous membranes are moist.  Eyes:     Extraocular Movements: Extraocular movements intact.     Conjunctiva/sclera: Conjunctivae normal.  Cardiovascular:     Rate and Rhythm: Normal rate.  Pulmonary:     Effort: Pulmonary effort is normal.     Breath sounds: Normal breath sounds.  Abdominal:     General: Abdomen is flat.     Palpations: Abdomen is soft.     Tenderness: There is no abdominal tenderness.  Musculoskeletal:        General: No swelling. Normal range of motion.     Cervical back: Neck supple.     Comments: L knee  in ACE wrap, distally no edema, pulses normal  Skin:    General: Skin is warm and dry.  Neurological:     General: No focal deficit present.     Mental Status: He is alert.  Psychiatric:        Mood and Affect: Mood normal.     ED Results / Procedures / Treatments   EKG EKG Interpretation  Date/Time:  Thursday August 17 2021 23:30:56 EDT Ventricular Rate:  67 PR Interval:  159 QRS Duration: 102 QT Interval:  398 QTC Calculation: 421 R Axis:   -17 Text Interpretation: Sinus rhythm Probable left atrial enlargement Borderline left axis deviation Abnormal R-wave progression, late transition No significant change since last tracing Confirmed by Susy Frizzle [phone]) on 08/17/2021 11:51:38 PM  Procedures Procedures  Medications Ordered in the ED Medications  sodium chloride 0.9 % bolus 1,000 mL (0 mLs Intravenous Stopped 08/18/21 0059)  ondansetron (ZOFRAN) injection 4 mg (4 mg Intravenous Given 08/18/21 0010)    Initial Impression and Plan  Patient here with multiple symptoms likely related to taking steroids. Given his history of CAD, will check labs and CXR. IVF and zofran for symptomatic care.   ED Course   Clinical Course as of 08/18/21 0202  Fri Aug 18, 2021  0001 I personally viewed the images from radiology studies and agree with radiologist interpretation: CXR is clear.   [CS]  0012 CBC is normal.  [CS]  0040 BMP is unremarkable.  [CS]  [id number] Initial Trop is normal. Will continue to monitor for repeat. Currently he is NSR on cardiac monitor.  [CS]  0157 Trop remains normal. Recommend that he stop the Medrol given his significant side effects. Continue pain meds and follow up with Ortho for further management.  [CS]    Clinical Course User Index [CS] Pollyann Savoy, MD     MDM Rules/Calculators/A&P Medical Decision Making Problems Addressed: Adverse effect of corticosteroids, initial encounter: acute illness or injury Chest pain, unspecified type: acute  illness or injury  Amount and/or Complexity of Data Reviewed Labs: ordered. Decision-making details documented in ED Course. Radiology: ordered and independent interpretation performed. Decision-making details documented in ED Course. ECG/medicine tests: ordered and independent interpretation performed. Decision-making details documented in ED Course.  Risk Prescription drug management.    Final Clinical Impression(s) / ED Diagnoses Final diagnoses:  Chest pain, unspecified type  Adverse effect of corticosteroids, initial encounter    Rx / DC Orders ED Discharge Orders     None        Pollyann Savoy, MD 08/18/21 0202

## 2021-08-17 NOTE — ED Triage Notes (Signed)
Pt went to ortho doctor for follow up of knee pain. After taking Methlprednisolone on Wednesday he started feeling bad with hight blood pressure, vomiting, headache, shivers, chest pain.

## 2021-08-18 LAB — CBC
HCT: 45.7 % (ref 39.0–52.0)
Hemoglobin: 15.7 g/dL (ref 13.0–17.0)
MCH: 30.1 pg (ref 26.0–34.0)
MCHC: 34.4 g/dL (ref 30.0–36.0)
MCV: 87.5 fL (ref 80.0–100.0)
Platelets: 206 10*3/uL (ref 150–400)
RBC: 5.22 MIL/uL (ref 4.22–5.81)
RDW: 13.3 % (ref 11.5–15.5)
WBC: 8.3 10*3/uL (ref 4.0–10.5)
nRBC: 0 % (ref 0.0–0.2)

## 2021-08-18 LAB — BASIC METABOLIC PANEL
Anion gap: 9 (ref 5–15)
BUN: 22 mg/dL — ABNORMAL HIGH (ref 6–20)
CO2: 27 mmol/L (ref 22–32)
Calcium: 9.6 mg/dL (ref 8.9–10.3)
Chloride: 104 mmol/L (ref 98–111)
Creatinine, Ser: 1.01 mg/dL (ref 0.61–1.24)
GFR, Estimated: >60 mL/min (ref >60)
Glucose, Bld: 115 mg/dL — ABNORMAL HIGH (ref 70–99)
Potassium: 3.8 mmol/L (ref 3.5–5.1)
Sodium: 140 mmol/L (ref 135–145)

## 2021-08-18 LAB — TROPONIN I (HIGH SENSITIVITY)
Troponin I (High Sensitivity): 5 ng/L (ref <18)
Troponin I (High Sensitivity): 5 ng/L (ref <18)

## 2021-08-18 MED ORDER — ONDANSETRON HCL 4 MG/2ML IJ SOLN
4.0000 mg | Freq: Once | INTRAMUSCULAR | Status: AC
  Administered 2021-08-18: 4 mg via INTRAVENOUS
  Filled 2021-08-18: qty 2

## 2021-08-18 MED ORDER — SODIUM CHLORIDE 0.9 % IV BOLUS
1000.0000 mL | Freq: Once | INTRAVENOUS | Status: AC
  Administered 2021-08-18: 1000 mL via INTRAVENOUS

## 2021-10-25 ENCOUNTER — Ambulatory Visit: Payer: 59 | Admitting: Emergency Medicine

## 2021-10-30 ENCOUNTER — Encounter: Payer: Self-pay | Admitting: Emergency Medicine

## 2021-10-30 ENCOUNTER — Ambulatory Visit (INDEPENDENT_AMBULATORY_CARE_PROVIDER_SITE_OTHER): Payer: Commercial Managed Care - HMO

## 2021-10-30 ENCOUNTER — Ambulatory Visit (INDEPENDENT_AMBULATORY_CARE_PROVIDER_SITE_OTHER): Payer: Commercial Managed Care - HMO | Admitting: Emergency Medicine

## 2021-10-30 ENCOUNTER — Other Ambulatory Visit: Payer: Self-pay | Admitting: Emergency Medicine

## 2021-10-30 VITALS — BP 130/82 | HR 65 | Temp 98.3°F | Ht 63.0 in | Wt 166.1 lb

## 2021-10-30 DIAGNOSIS — I25119 Atherosclerotic heart disease of native coronary artery with unspecified angina pectoris: Secondary | ICD-10-CM | POA: Diagnosis not present

## 2021-10-30 DIAGNOSIS — I10 Essential (primary) hypertension: Secondary | ICD-10-CM

## 2021-10-30 DIAGNOSIS — M79671 Pain in right foot: Secondary | ICD-10-CM

## 2021-10-30 DIAGNOSIS — Z23 Encounter for immunization: Secondary | ICD-10-CM

## 2021-10-30 DIAGNOSIS — G8929 Other chronic pain: Secondary | ICD-10-CM

## 2021-10-30 DIAGNOSIS — R7303 Prediabetes: Secondary | ICD-10-CM | POA: Diagnosis not present

## 2021-10-30 DIAGNOSIS — M7731 Calcaneal spur, right foot: Secondary | ICD-10-CM | POA: Insufficient documentation

## 2021-10-30 MED ORDER — ROSUVASTATIN CALCIUM 40 MG PO TABS: 40.0000 mg | ORAL_TABLET | Freq: Every day | ORAL | 3 refills | Status: DC

## 2021-10-30 MED ORDER — LISINOPRIL 10 MG PO TABS: 10.0000 mg | ORAL_TABLET | Freq: Every day | ORAL | 3 refills | Status: DC

## 2021-10-30 NOTE — Patient Instructions (Signed)
Hipertensin en los adultos Hypertension, Adult El trmino hipertensin es otra forma de denominar a la presin arterial elevada. La presin arterial elevada fuerza al corazn a trabajar ms para bombear la sangre. Esto puede causar problemas con el paso del tiempo. Una lectura de presin arterial est compuesta por 2 nmeros. Hay un nmero superior (sistlico) sobre un nmero inferior (diastlico). Lo ideal es tener la presin arterial por debajo de 120/80. Cules son las causas? Se desconoce la causa de esta afeccin. Algunas otras afecciones pueden provocar presin arterial elevada. Qu incrementa el riesgo? Algunos factores del estilo de vida pueden hacer que tenga ms probabilidades de desarrollar presin arterial elevada: Fumar. No hacer la cantidad suficiente de actividad fsica o ejercicio. Tener sobrepeso. Consumir mucha grasa, azcar, caloras o sal (sodio) en su dieta. Beber alcohol en exceso. Otros factores de riesgo son los siguientes: Tener alguna de estas afecciones: Enfermedad cardaca. Diabetes. Colesterol alto. Enfermedad renal. Apnea obstructiva del sueo. Tener antecedentes familiares de presin arterial elevada y colesterol elevado. Edad. El riesgo aumenta con la edad. Estrs. Cules son los signos o sntomas? Es posible que la presin arterial alta no cause sntomas. La presin arterial muy alta (crisis hipertensiva) puede provocar: Dolor de cabeza. Latidos cardacos acelerados o irregulares (palpitaciones). Falta de aire. Hemorragia nasal. Vomitar o sentir ganas de vomitar (nuseas). Cambios en la forma de ver. Dolor muy intenso en el pecho. Sensacin de mareo. Convulsiones. Cmo se trata? Esta afeccin se trata haciendo cambios saludables en el estilo de vida, por ejemplo: Consumir alimentos saludables. Hacer ms ejercicio. Beber menos alcohol. El mdico puede recetarle medicamentos si los cambios en el estilo de vida no son lo suficientemente  eficaces y si: El nmero de arriba est por encima de 130. El nmero de abajo est por encima de 80. Su presin arterial personal ideal puede variar. Siga estas indicaciones en su casa: Comida y bebida  Si se lo dicen, siga el plan de alimentacin de DASH (Dietary Approaches to Stop Hypertension, Maneras de alimentarse para detener la hipertensin). Para seguir este plan: Llene la mitad del plato de cada comida con frutas y verduras. Llene un cuarto del plato de cada comida con cereales integrales. Los cereales integrales incluyen pasta integral, arroz integral y pan integral. Coma y beba productos lcteos con bajo contenido de grasa, como leche descremada o yogur bajo en grasas. Llene un cuarto del plato de cada comida con protenas bajas en grasa (magras). Las protenas bajas en grasa incluyen pescado, pollo sin piel, huevos, frijoles y tofu. Evite consumir carne grasa, carne curada y procesada, o pollo con piel. Evite consumir alimentos prehechos o procesados. Limite la cantidad de sal en su dieta a menos de 1500 mg por da. No beba alcohol si: El mdico le indica que no lo haga. Est embarazada, puede estar embarazada o est tratando de quedar embarazada. Si bebe alcohol: Limite la cantidad que bebe a lo siguiente: De 0 a 1 medida por da para las mujeres. De 0 a 2 medidas por da para los hombres. Sepa cunta cantidad de alcohol hay en las bebidas que toma. En [place], una medida equivale a una botella de cerveza de 12 oz (355 ml), un vaso de vino de 5 oz (148 ml) o un vaso de una bebida alcohlica de alta graduacin de 1 oz (44 ml). Estilo de vida  Trabaje con su mdico para mantenerse en un peso saludable o para perder peso. Pregntele a su mdico cul es el peso recomendable para   usted. Realice al menos 30 minutos de ejercicio que haga que se acelere su corazn (ejercicio aerbico) la mayora de los das de la semana. Estos pueden incluir caminar, nadar o andar en  bicicleta. Realice al menos 30 minutos de ejercicio que fortalezca sus msculos (ejercicios de resistencia) al menos 3 das a la semana. Estos pueden incluir levantar pesas o hacer Pilates. No fume ni consuma ningn producto que contenga nicotina o tabaco. Si necesita ayuda para dejar de consumir estos productos, consulte al mdico. Controle su presin arterial en su casa tal como le indic el mdico. Concurra a todas las visitas de seguimiento. Medicamentos Use los medicamentos de venta libre y los recetados solamente como se lo haya indicado el mdico. Siga cuidadosamente las indicaciones. No omita las dosis de medicamentos para la presin arterial. Los medicamentos pierden eficacia si omite dosis. El hecho de omitir las dosis tambin aumenta el riesgo de otros problemas. Pregntele a su mdico a qu efectos secundarios o reacciones a los medicamentos debe prestar atencin. Comunquese con un mdico si: Piensa que tiene una reaccin a los medicamentos que est tomando. Tiene dolores de cabeza frecuentes. Siente mareos. Tiene hinchazn en los tobillos. Tiene problemas de visin. Solicite ayuda de inmediato si: Siente un dolor de cabeza muy intenso. Empieza a sentirse desorientado (confundido). Se siente dbil o adormecido. Siente que va a desmayarse. Tiene un dolor muy intenso en: Pecho. Vientre (abdomen). Vomita ms de una vez. Tiene dificultad para respirar. Estos sntomas pueden indicar una emergencia. Solicite ayuda de inmediato. Llame al 911. No espere a ver si los sntomas desaparecen. No conduzca por sus propios medios hasta el hospital. Resumen El trmino hipertensin es otra forma de denominar a la presin arterial elevada. La presin arterial elevada fuerza al corazn a trabajar ms para bombear la sangre. Para la mayora de las personas, una presin arterial normal es menor que 120/80. Las decisiones saludables pueden ayudarle a disminuir su presin arterial. Si no puede  bajar su presin arterial mediante decisiones saludables, es posible que deba tomar medicamentos. Esta informacin no tiene como fin reemplazar el consejo del mdico. Asegrese de hacerle al mdico cualquier pregunta que tenga. Document Revised: 10/27/2020 Document Reviewed: 10/27/2020 Elsevier Patient Education  2023 [employer]  

## 2021-10-30 NOTE — Assessment & Plan Note (Signed)
Stable.  Diet and nutrition discussed. 

## 2021-10-30 NOTE — Assessment & Plan Note (Addendum)
Well-controlled hypertension. Continue lisinopril 10 mg daily and amlodipine 5 mg daily BP Readings from Last 3 Encounters:  10/30/21 130/82  08/18/21 (!) 135/92  08/12/21 (!) 159/95  Cardiovascular risks associated with hypertension discussed. Diet and nutrition discussed. Follow-up in 6 months.

## 2021-10-30 NOTE — Assessment & Plan Note (Addendum)
Stable.  No recent episodes of angina. No need for sublingual nitroglycerin No longer taking Plavix Takes daily baby aspirin. Continue rosuvastatin 40 mg daily.

## 2021-10-30 NOTE — Progress Notes (Signed)
Ethan Silva 56 y.o.   Chief Complaint  Patient presents with   Follow-up    6 mnth f/u appt, no concerns     HISTORY OF PRESENT ILLNESS: This is a 56 y.o. male here for 73-month follow-up of hypertension. Doing well. Has occasional pain to right foot. No other complaints or medical concerns today.  HPI   Prior to Admission medications   Medication Sig Start Date End Date Taking? Authorizing Provider  acetaminophen (TYLENOL) 325 MG tablet Take 325 mg by mouth every 6 (six) hours as needed for moderate pain.   Yes [provider]  amLODipine (NORVASC) 5 MG tablet Take 1 tablet (5 mg total) by mouth daily. 02/01/21  Yes Warren Lacy, PA-C  aspirin 81 MG EC tablet Take 1 tablet (81 mg total) by mouth daily. Swallow whole. 07/15/20  Yes O'Neal, Cassie Freer, MD  Bempedoic Acid-Ezetimibe (NEXLIZET) 180-10 MG TABS Take 1 tablet by mouth daily. 04/04/21  Yes Warren Lacy, PA-C  clopidogrel (PLAVIX) 75 MG tablet Take 1 tablet (75 mg total) by mouth daily. 02/01/21  Yes Warren Lacy, PA-C  HYDROcodone-acetaminophen (NORCO/VICODIN) 5-325 MG tablet Take 1 tablet by mouth every 6 (six) hours as needed for severe pain. 08/12/21  Yes Truddie Hidden, MD  Multiple Vitamins-Minerals [facility] FOR [facility]) TABS Take 1 tablet by mouth daily. 03/31/21  Yes Warren Lacy, PA-C  nitroGLYCERIN (NITROSTAT) 0.4 MG SL tablet Place 1 tablet (0.4 mg total) under the tongue every 5 (five) minutes x 3 doses as needed for chest pain. 07/15/20  Yes O'Neal, Cassie Freer, MD  ondansetron (ZOFRAN ODT) 4 MG disintegrating tablet Take 1 tablet (4 mg total) by mouth every 8 (eight) hours as needed for nausea or vomiting. 08/27/20  Yes Sherwood Gambler, MD  pantoprazole (PROTONIX) 40 MG tablet Take 1 tablet (40 mg total) by mouth daily. 11/21/20  Yes [name] K, PA-C  polyethylene glycol powder (GLYCOLAX/MIRALAX) 17 GM/SCOOP powder Take 17 g by mouth 2 (two) times daily as  needed. 10/11/20  Yes Biagio Borg, MD  lisinopril (ZESTRIL) 10 MG tablet Take 1 tablet (10 mg total) by mouth daily. Patient taking differently: Take 10 mg by mouth daily as needed (high blood pressure). 02/01/21 07/24/21  Warren Lacy, PA-C  rosuvastatin (CRESTOR) 40 MG tablet Take 1 tablet (40 mg total) by mouth daily. 02/01/21 07/24/21  Warren Lacy, PA-C    No Known Allergies  Patient Active Problem List   Diagnosis Date Noted   Axillary lymphadenopathy 04/25/2021   Aortic atherosclerosis ([place]) 04/25/2021   Coronary artery disease involving native coronary artery of native heart with angina pectoris ([place]) 07/15/2020   Prediabetes 07/15/2020   NSTEMI (non-ST elevated myocardial infarction) ([place]) 07/14/2020   BMI 30.0-30.9,adult 08/30/2015   Hyperlipidemia with target LDL less than 70 08/30/2015   Abdominal pain 11/21/2012   Essential hypertension 10/11/2012    Past Medical History:  Diagnosis Date   Aortic stenosis, mild 07/2012   Very mild aortic stenosis noted on echo-mean gradient 11 mmHg.   Bile duct leak    CAD in native artery 07/14/2020   Small branch of OM 3 with 80% ostial stenosis.  Too small for PCI.  Otherwise questionable 30% LM and 30% PDA.   Chronic kidney disease 2017   left kidney stones   Headache(784.0)    Hx: of when BP is elevated   Hyperlipidemia    Hypertension    Non-STEMI (non-ST elevated myocardial infarction) ([place]) 07/13/2020  Admitted with hypertension and chest pain.  Cardiac cath showed small branch of an OM3 with 85% ostial stenosis not amenable to PCI.  No other significant disease noted.  Normal EF on echo.  Medical management.   Numbness and tingling in hands    Hx: of   Prediabetes     Past Surgical History:  Procedure Laterality Date   BILIARY DILATION  03/16/2021   Procedure: BILIARY DILATION;  Surgeon: Milus Banister, MD;  Location: [facility];  Service: Endoscopy;;   CHOLECYSTECTOMY N/A 07/11/2012   Procedure:  LAPAROSCOPIC CHOLECYSTECTOMY WITH INTRAOPERATIVE CHOLANGIOGRAM;  Surgeon: Ralene Ok, MD;  Location: [facility];  Service: General;  Laterality: N/A;   COLONOSCOPY     ENDOSCOPIC RETROGRADE CHOLANGIOPANCREATOGRAPHY (ERCP) WITH PROPOFOL N/A 03/16/2021   Procedure: ENDOSCOPIC RETROGRADE CHOLANGIOPANCREATOGRAPHY (ERCP) WITH PROPOFOL;  Surgeon: Milus Banister, MD;  Location: [facility];  Service: Endoscopy;  Laterality: N/A;   ERCP  07/16/2012   ERCP N/A 07/16/2012   Procedure: ENDOSCOPIC RETROGRADE CHOLANGIOPANCREATOGRAPHY (ERCP);  Surgeon: Inda Castle, MD;  Location: [facility];  Service: Gastroenterology;  Laterality: N/A;   ERCP N/A 10/06/2012   Procedure: ENDOSCOPIC RETROGRADE CHOLANGIOPANCREATOGRAPHY (ERCP);  Surgeon: Inda Castle, MD;  Location: [facility];  Service: Endoscopy;  Laterality: N/A;   ESOPHAGOGASTRODUODENOSCOPY N/A 03/16/2021   Procedure: ESOPHAGOGASTRODUODENOSCOPY (EGD);  Surgeon: Milus Banister, MD;  Location: [facility];  Service: Endoscopy;  Laterality: N/A;   EUS N/A 03/16/2021   Procedure: UPPER ENDOSCOPIC ULTRASOUND (EUS) RADIAL;  Surgeon: Milus Banister, MD;  [facility];  Service: Endoscopy;  Laterality: N/A;   HAND SURGERY     LEFT HEART CATH AND CORONARY ANGIOGRAPHY N/A 07/14/2020   Procedure: LEFT HEART CATH AND CORONARY ANGIOGRAPHY;  Surgeon: Belva Crome, MD;  Location: [facility];  Service: Cardiovascular; ? Culprit Lesion ~ 80% ostial small OM3 (too small & ostial for PCI). ~30% mid LM (at a bend).  LAD with D1 & D2 - normal, RCA minimal luminal irregularities ~ 30%. PDA   SPHINCTEROTOMY  03/16/2021   Procedure: SPHINCTEROTOMY;  Surgeon: Milus Banister, MD;  Location: [facility];  Service: Endoscopy;;   TRANSTHORACIC ECHOCARDIOGRAM  07/14/2020   (NSTEMI/Accelerated Hypertension): EF 55 to 60%.  No RWM A.  Very mild Aortic Stenosis-mean gradient 11 to mmHg.    Social History   Socioeconomic History   Marital status: Married     Spouse name: Alyson Locket   Number of children: 3   Years of education: 6th grade   Highest education level: Not on file  Occupational History   Occupation: Painting    Employer: [employer], [place], [place]    Comment: Mostly indoor painting  Tobacco Use   Smoking status: Never    Passive exposure: Never   Smokeless tobacco: Never  Vaping Use   Vaping Use: Never used  Substance and Sexual Activity   Alcohol use: No   Drug use: No   Sexual activity: Yes    Partners: Female  Other Topics Concern   Not on file  Social History Narrative   Originally from [place], [place]. [place] to the [place] in [place].   Lives with his wife and their youngest son   His daughter lives in [place], [place] with her husband.  Second son is now moved out of the house as well.   He gets a decent amount of exercise walking around at work, but does not do routine exercise.  Does not drink or smoke   Social  Determinants of Health   Financial Resource Strain: Not on file  Food Insecurity: Not on file  Transportation Needs: Not on file  Physical Activity: Not on file  Stress: Not on file  Social Connections: Not on file  Intimate Partner Violence: Not on file    Family History  Problem Relation Age of Onset   Gallstones Mother    Hypertension Mother    Gallstones Brother    Diabetes Paternal Uncle      Review of Systems  Constitutional: Negative.  Negative for chills and fever.  HENT: Negative.  Negative for congestion and sore throat.   Respiratory: Negative.  Negative for cough and shortness of breath.   Cardiovascular: Negative.  Negative for chest pain and palpitations.  Gastrointestinal:  Negative for abdominal pain, diarrhea, nausea and vomiting.  Musculoskeletal:        Right foot pain  Skin: Negative.  Negative for rash.  Neurological: Negative.  Negative for dizziness and headaches.  All other systems reviewed and are negative.  Today's Vitals   10/30/21 0849  BP: 130/82   Pulse: 65  Temp: 98.3 F (36.8 C)  TempSrc: Oral  SpO2: 95%  Weight: 166 lb 2 oz (75.4 kg)  Height: 5\' 3"  (1.6 m)   Body mass index is 29.43 kg/m.   Physical Exam Vitals reviewed.  Constitutional:      Appearance: Normal appearance.  HENT:     Head: Normocephalic.  Eyes:     Extraocular Movements: Extraocular movements intact.     Conjunctiva/sclera: Conjunctivae normal.     Pupils: Pupils are equal, round, and reactive to light.  Cardiovascular:     Rate and Rhythm: Normal rate and regular rhythm.     Pulses: Normal pulses.     Heart sounds: Normal heart sounds.  Pulmonary:     Effort: Pulmonary effort is normal.     Breath sounds: Normal breath sounds.  Skin:    General: Skin is warm and dry.  Neurological:     General: No focal deficit present.     Mental Status: He is alert and oriented to person, place, and time.  Psychiatric:        Mood and Affect: Mood normal.        Behavior: Behavior normal.   DG Foot Complete Right  Result Date: 10/30/2021 CLINICAL DATA:  Chronic right foot pain. EXAM: RIGHT FOOT COMPLETE - 3+ VIEW COMPARISON:  None Available. FINDINGS: There is no evidence of fracture or dislocation. There is no evidence of arthropathy. Mild posterior calcaneal spurring is noted. Soft tissues are unremarkable. IMPRESSION: No acute abnormality seen. Electronically Signed   By: Marijo Conception M.D.   On: 10/30/2021 09:39     ASSESSMENT & PLAN: A total of 47 minutes was spent with the patient and counseling/coordination of care regarding preparing for this visit, review of most recent office visit notes, review of multiple chronic medical problems and their management, review of all medications, differential diagnosis of chronic right foot pain, review of x-ray report, education on nutrition, review of most recent blood work results, prognosis, documentation, and need for follow-up.  Problem List Items Addressed This Visit       Cardiovascular and  Mediastinum   Essential hypertension - Primary (Chronic)    Well-controlled hypertension. Continue lisinopril 10 mg daily and amlodipine 5 mg daily BP Readings from Last 3 Encounters:  10/30/21 130/82  08/18/21 (!) 135/92  08/12/21 (!) 159/95  Cardiovascular risks associated with hypertension discussed. Diet  and nutrition discussed. Follow-up in 6 months.       Relevant Medications   lisinopril (ZESTRIL) 10 MG tablet   rosuvastatin (CRESTOR) 40 MG tablet   Coronary artery disease involving native coronary artery of native heart with angina pectoris (HCC) (Chronic)    Stable.  No recent episodes of angina. No need for sublingual nitroglycerin No longer taking Plavix Takes daily baby aspirin. Continue rosuvastatin 40 mg daily.      Relevant Medications   lisinopril (ZESTRIL) 10 MG tablet   rosuvastatin (CRESTOR) 40 MG tablet     Other   Prediabetes (Chronic)    Stable.  Diet and nutrition discussed.      Other Visit Diagnoses     Right foot pain       Relevant Orders   DG Foot Complete Right (Completed)   Need for vaccination       Relevant Orders   Flu Vaccine QUAD 6+ mos PF IM (Fluarix Quad PF) (Completed)       Patient Instructions  Hipertensin en los adultos Hypertension, Adult El trmino hipertensin es otra forma de denominar a la presin arterial elevada. La presin arterial elevada fuerza al corazn a trabajar ms para bombear la sangre. Esto puede causar problemas con el paso del [place]. Una lectura de presin arterial est compuesta por 2 nmeros. Hay un nmero superior (sistlico) sobre un nmero inferior (diastlico). Lo ideal es tener la presin arterial por debajo de 120/80. Cules son las causas? Se desconoce la causa de esta afeccin. Algunas otras afecciones pueden provocar presin arterial elevada. Qu incrementa el riesgo? Algunos factores del estilo de vida pueden hacer que tenga ms probabilidades de desarrollar presin arterial  elevada: Fumar. No hacer la cantidad suficiente de actividad fsica o ejercicio. Tener sobrepeso. Consumir mucha grasa, azcar, caloras o sal (sodio) en su dieta. Beber alcohol en exceso. Otros factores de riesgo son los siguientes: Tener alguna de estas afecciones: Enfermedad cardaca. Diabetes. Colesterol alto. Enfermedad renal. Apnea obstructiva del sueo. Tener antecedentes familiares de presin arterial elevada y colesterol elevado. Edad. El riesgo aumenta con la edad. Estrs. Cules son los signos o sntomas? Es posible que la presin arterial alta no cause sntomas. La presin arterial muy alta (crisis hipertensiva) puede provocar: Dolor de cabeza. Latidos cardacos acelerados o irregulares (palpitaciones). Falta de aire. Hemorragia nasal. Vomitar o sentir ganas de vomitar (nuseas). Cambios en la forma de ver. Dolor muy intenso en el pecho. Sensacin de [employer]. Convulsiones. Cmo se trata? Esta afeccin se trata haciendo cambios saludables en el estilo de vida, por ejemplo: Consumir alimentos saludables. Hacer ms ejercicio. Beber menos alcohol. El mdico puede recetarle medicamentos si los cambios en el estilo de vida no son lo suficientemente eficaces y si: El nmero de arriba est por encima de 130. El nmero de abajo est por encima de 80. Su presin arterial personal ideal puede variar. Siga estas indicaciones en su casa: Comida y bebida  Si se lo dicen, siga el plan de alimentacin de DASH (Dietary Approaches to Stop Hypertension, Maneras de alimentarse para detener la hipertensin). Para seguir este plan: Llene la mitad del plato de cada comida con frutas y verduras. Llene un cuarto del plato de cada comida con cereales integrales. Los cereales integrales incluyen pasta integral, arroz integral y pan integral. Coma y beba productos lcteos con bajo contenido de grasa, como leche descremada o yogur bajo en grasas. Llene un cuarto del plato de cada comida con  protenas bajas en grasa (magras). Las protenas  bajas en grasa incluyen pescado, pollo sin piel, huevos, frijoles y tofu. Evite consumir carne grasa, carne curada y procesada, o pollo con piel. Evite consumir alimentos prehechos o procesados. Limite la cantidad de sal en su dieta a menos de 1500 mg por da. No beba alcohol si: El mdico le indica que no lo haga. Est embarazada, puede estar embarazada o est tratando de [place]. Si bebe alcohol: Limite la cantidad que bebe a lo siguiente: De 0 a 1 medida por da para las mujeres. De 0 a 2 medidas por da para los hombres. Sepa cunta cantidad de alcohol hay en las bebidas que toma. En los [place], una medida equivale a una botella de cerveza de 12 oz (355 ml), un vaso de vino de 5 oz (148 ml) o un vaso de una bebida alcohlica de alta graduacin de 1 oz (44 ml). Estilo de vida  Trabaje con su mdico para mantenerse en un peso saludable o para perder peso. Pregntele a su mdico cul es el peso recomendable para usted. Realice al menos 30 minutos de ejercicio que haga que se acelere su corazn (ejercicio Arboriculturist) la [employer] de la [place]. Estos pueden incluir caminar, nadar o andar en bicicleta. Realice al menos 30 minutos de ejercicio que fortalezca sus msculos (ejercicios de resistencia) al menos 3 das a la [place]. Estos pueden incluir levantar pesas o hacer Pilates. No fume ni consuma ningn producto que contenga nicotina o tabaco. Si necesita ayuda para dejar de consumir estos productos, consulte al mdico. Controle su presin arterial en su casa tal como le indic el mdico. Concurra a todas las visitas de seguimiento. Medicamentos Use los medicamentos de venta libre y los recetados solamente como se lo haya indicado el mdico. Siga cuidadosamente las indicaciones. No omita las dosis de medicamentos para la presin arterial. Los medicamentos pierden eficacia si omite dosis. El hecho de omitir las dosis  tambin [place] el riesgo de otros problemas. Pregntele a su mdico a qu efectos secundarios o reacciones a los Careers information officer. Comunquese con un mdico si: Piensa que tiene [place] reaccin a los medicamentos que est tomando. Tiene dolores de cabeza frecuentes. Siente mareos. Tiene hinchazn en los tobillos. Tiene problemas de visin. Solicite ayuda de inmediato si: Siente un dolor de cabeza muy intenso. Empieza a sentirse desorientado (confundido). Se siente dbil o adormecido. Siente que va a desmayarse. Tiene un dolor muy intenso en: Pecho. Vientre (abdomen). Vomita ms de una vez. Tiene dificultad para respirar. Estos sntomas pueden Sales executive. Solicite ayuda de inmediato. Llame al 911. No espere a ver si los sntomas desaparecen. No conduzca por sus propios medios [employer]. Resumen El trmino hipertensin es otra forma de denominar a la presin arterial elevada. La presin arterial elevada fuerza al corazn a trabajar ms para bombear la sangre. Para la [employer], una presin arterial normal es menor que 120/80. Las decisiones saludables pueden ayudarle a disminuir su presin arterial. Si no puede bajar su presin arterial mediante decisiones saludables, es posible que deba tomar medicamentos. Esta informacin no tiene Marine scientist el consejo del mdico. Asegrese de hacerle al mdico cualquier pregunta que tenga. Document Revised: 10/27/2020 Document Reviewed: 10/27/2020 Elsevier Patient Education  L[place], MD [facility]

## 2021-11-13 ENCOUNTER — Ambulatory Visit: Payer: Commercial Managed Care - HMO | Attending: Cardiology | Admitting: Cardiology

## 2021-11-13 ENCOUNTER — Other Ambulatory Visit: Payer: Self-pay | Admitting: *Deleted

## 2021-11-13 ENCOUNTER — Encounter: Payer: Self-pay | Admitting: Cardiology

## 2021-11-13 VITALS — BP 140/102 | HR 65 | Ht 63.0 in | Wt 166.6 lb

## 2021-11-13 DIAGNOSIS — E785 Hyperlipidemia, unspecified: Secondary | ICD-10-CM | POA: Diagnosis not present

## 2021-11-13 DIAGNOSIS — I214 Non-ST elevation (NSTEMI) myocardial infarction: Secondary | ICD-10-CM

## 2021-11-13 DIAGNOSIS — I700 Atherosclerosis of aorta: Secondary | ICD-10-CM

## 2021-11-13 DIAGNOSIS — I25119 Atherosclerotic heart disease of native coronary artery with unspecified angina pectoris: Secondary | ICD-10-CM | POA: Diagnosis not present

## 2021-11-13 DIAGNOSIS — R7303 Prediabetes: Secondary | ICD-10-CM

## 2021-11-13 DIAGNOSIS — I10 Essential (primary) hypertension: Secondary | ICD-10-CM

## 2021-11-13 MED ORDER — NITROGLYCERIN 0.4 MG SL SUBL: 0.4000 mg | SUBLINGUAL_TABLET | SUBLINGUAL | 6 refills | Status: DC | PRN

## 2021-11-13 MED ORDER — PANTOPRAZOLE SODIUM 40 MG PO TBEC: 40.0000 mg | DELAYED_RELEASE_TABLET | Freq: Every day | ORAL | 1 refills | Status: DC

## 2021-11-13 NOTE — Patient Instructions (Addendum)
Medication Instructions:   No changes   You can change to over the counter medication for stomach if needed -- protonix  *If you need a refill on your cardiac medications before your next appointment, please call your pharmacy*   Lab Work: In Jan or Feb of 2024 Fasting labs - no eating or drinking the morning of the test  CMP LIPID [id number]  If you have labs (blood work) drawn today and your tests are completely normal, you will receive your results only by: MyChart Message (if you have MyChart) OR A paper copy in the mail If you have any lab test that is abnormal or we need to change your treatment, we will call you to review the results.   Testing/Procedures:  Not needed  Follow-Up: At [facility], you and your health needs are our priority.  As part of our continuing mission to provide you with exceptional heart care, we have created designated Provider Care Teams.  These Care Teams include your primary Cardiologist (physician) and Advanced Practice Providers (APPs -  Physician Assistants and Nurse Practitioners) who all work together to provide you with the care you need, when you need it.     Your next appointment:   6 month(s)  The format for your next appointment:   In Person  Provider:   Bryan Lemma, MD    Other Instructions

## 2021-11-13 NOTE — Progress Notes (Signed)
Primary Care Provider: Georgina Quint, MD [facility] Cardiologist: Ethan Lemma, MD Electrophysiologist: None  Clinic Note: Chief Complaint  Patient presents with   Follow-up    Doing well.  Admits episodes of chest pain however.   Coronary Artery Disease    Essentially nonocclusive by cath.  Been having chest pains.   Hypertension    Much higher here than it is at home.  Higher on recheck.   ===================================  ASSESSMENT/PLAN   Problem List Items Addressed This Visit       Cardiology Problems   Coronary artery disease involving native coronary artery of native heart with angina pectoris (HCC) - Primary (Chronic)    Interestingly, he told Dr. Alvy Silva 14 days ago that he was not having chest.  He tells me he is having intermittent episodes of chest pain which to me sounds more musculoskeletal in nature.  There is also some that seems like is more related to GERD.  He says he feels better he burps.  Plan: Off of Plavix, on aspirin alone since he is 1 year out from MI without PCI. On combination of rosuvastatin and Nexlizet. Despite having episodes of chest pain he is not using sublingual nitro. He is on amlodipine 5 mg which I have a low threshold to consider increasing to 10 mg since he is having unusual chest pain and high blood pressure.  He is also on 10 of lisinopril which can be increased as well.   Not on beta-blocker because of concerns of fatigue and bradycardia.      Relevant Medications   nitroGLYCERIN (NITROSTAT) 0.4 MG SL tablet   Other Relevant Orders   Lipid panel   Comprehensive metabolic panel   Hemoglobin A1c   Essential hypertension (Chronic)    His blood pressure is great with his PCP not that long ago.  Today he is in pain because his knee sent her to a lot of walking and here.  I had a low threshold to increase his amlodipine to 10 mg, but will hold off for now and can simply continue amlodipine at 5 mg and  lisinopril at 10 mg.  If pressures continue increase would increase amlodipine first to 10 mg on the off chance that some of his symptoms are indeed anginal in nature since amlodipine works better as an antianginal.      Relevant Medications   nitroGLYCERIN (NITROSTAT) 0.4 MG SL tablet   Hyperlipidemia with target LDL less than 70 (Chronic)    Labs were just checked in July and his LDL was down to 50 on the combination of 40 mg rosuvastatin and Nexlizet.  Continue current meds.      Relevant Medications   nitroGLYCERIN (NITROSTAT) 0.4 MG SL tablet   Other Relevant Orders   Lipid panel   Comprehensive metabolic panel   Hemoglobin A1c   NSTEMI (non-ST elevated myocardial infarction) (HCC) (Chronic)    Initial presentation was with a possible non-STEMI.  He did not have a culprit lesion for PCI, had a small caliber OM1 that was severely diseased.  Not likely causing angina at this point. Preserved EF by Echo.      Relevant Medications   nitroGLYCERIN (NITROSTAT) 0.4 MG SL tablet   Other Relevant Orders   Lipid panel   Comprehensive metabolic panel   Hemoglobin A1c   Aortic atherosclerosis (HCC) (Chronic)    Appropriately treated along with cardiovascular risk factor modification.      Relevant Medications   nitroGLYCERIN (NITROSTAT)  0.4 MG SL tablet     Other   Prediabetes (Chronic)    A1c was 5.9 last year.  Monitor diet and increase exercise.  If he does need developed diabetes, low threshold to consider SGLT2 inhibitor.       ===================================  HPI:    Ethan Silva is a 56 y.o. male with a PMH below who presents today for 52-month follow-up at the request of Ethan Silva, *.  Ethan Silva was last seen on February 01, 2021 by Ethan Crumble, PA follow-up to discuss blood pressure (she had seen him back in December 2022).  He had been started on lisinopril 10 mg daily.  No changes to meds.  Noted getting  shortness of breath and was hot outside.  Also getting lightheaded if he gets up quickly.  No syncope or near STEMI.  Mild chest discomfort that gets better with burping.  There is some calf discomfort with walking.  Could be related knee pain. => Plan to continue follow-up at [facility], And then with me in July.  Plan was to stop Plavix after 1 year from MI (no PCI done) ABIs checked along with a panel.   Recent Hospitalizations:  08/17/2021: ER visit for chest pain-noted headache nausea chest discomfort and hypertension being seen for knee pain started on Solu-Medrol  Seen in [facility] lipid clinic on 03/31/2021-noted that he is feeling well.  In good spirits.  No chest pain.  Trying to eat differently.  Labs from February 1 showed LDL of 80 on rosuvastatin 40 mg and Zetia 10 mg.  They discussed PCSK9 inhibitor he preferred to add Nexletol. Continue rosuvastatin 40 mg daily-add to Nexlizet and stopped Zetia.  He was just seen by his PCP Dr. Alvy Silva on October 30.  Today he was doing well.  Occasional pain on the right foot.  Denied chest pain or pressure.  Denied palpitations.  Denied claudication.  Blood pressure was 130/82.  Reviewed  CV studies:    The following studies were reviewed today: (if available, images/films reviewed: From Epic Chart or Care Everywhere) Lower extremity arterial Dopplers 03/24/2021: Bilateral ABIs and TBI's in normal range (R ABI 1.06, L ABI 112, R TBI 1.11, L TBI 1.10.).    Normal triphasic waveforms.  Argues against any notable PAD.  Interval History:   Ethan Silva presents here over overall doing pretty well.  Video interpreter-Ethan Silva used for the interview.  He notes off-and-on left side of her chest discomfort that comes and goes not necessarily associate with exertion.  But he says he usually feels better after he burps.  Oftentimes he notes that after eating [place] to work.  Obesity can also happen while he is at work but usually after eating.  He  actually notes more prominent discomfort at rest that can last up to 5 minutes. He says he is just feeling better the end of the day.  He can become a little bit dizzy when he first gets up in the morning.  He denies any rapid regular heartbeats palpitations.  No syncope or near syncope.  No PND orthopnea.  CV Review of Symptoms (Summary):: positive for - chest pain and atypical sounding as noted above negative for - dyspnea on exertion, edema, irregular heartbeat, orthopnea, palpitations, paroxysmal nocturnal dyspnea, rapid heart rate, shortness of breath, or syncope or near syncope, TIA/amaurosis fugax, claudication.  REVIEWED OF SYSTEMS   Review of Systems  Constitutional:  Negative for malaise/fatigue and weight loss.  HENT:  Negative  for congestion.   Respiratory:  Negative for shortness of breath.   Gastrointestinal:  Negative for blood in stool, constipation and melena.  Genitourinary:  Negative for dysuria and hematuria.  Musculoskeletal:  Positive for joint pain (Left knee is really limiting his activity.  He fell at work and hit his knee.  Difficult for him to walk now).  Neurological:  Positive for dizziness (When changing positions quickly or turning his head.). Negative for focal weakness and weakness.  Psychiatric/Behavioral:  Negative for depression. The patient is nervous/anxious. The patient does not have insomnia.     I have reviewed and (if needed) personally updated the patient's problem list, medications, allergies, past medical and surgical history, social and family history.   PAST MEDICAL HISTORY   Past Medical History:  Diagnosis Date   Aortic stenosis, mild 07/2012   Very mild aortic stenosis noted on echo-mean gradient 11 mmHg.   Bile duct leak    CAD in native artery 07/14/2020   Small branch of OM 3 with 80% ostial stenosis.  Too small for PCI.  Otherwise questionable 30% LM and 30% PDA.   Chronic kidney disease 2017   left kidney stones    Headache(784.0)    Hx: of when BP is elevated   Hyperlipidemia    Hypertension    Non-STEMI (non-ST elevated myocardial infarction) (HCC) 07/13/2020   Admitted with hypertension and chest pain.  Cardiac cath showed small branch of an OM3 with 85% ostial stenosis not amenable to PCI.  No other significant disease noted.  Normal EF on echo.  Medical management.   Numbness and tingling in hands    Hx: of   Prediabetes     PAST SURGICAL HISTORY   Past Surgical History:  Procedure Laterality Date   BILIARY DILATION  03/16/2021   Procedure: BILIARY DILATION;  Surgeon: Rachael Fee, MD;  Location: [facility];  Service: Endoscopy;;   CHOLECYSTECTOMY N/A 07/11/2012   Procedure: LAPAROSCOPIC CHOLECYSTECTOMY WITH INTRAOPERATIVE CHOLANGIOGRAM;  Surgeon: Axel Filler, MD;  Location: [facility] OR;  Service: General;  Laterality: N/A;   COLONOSCOPY     ENDOSCOPIC RETROGRADE CHOLANGIOPANCREATOGRAPHY (ERCP) WITH PROPOFOL N/A 03/16/2021   Procedure: ENDOSCOPIC RETROGRADE CHOLANGIOPANCREATOGRAPHY (ERCP) WITH PROPOFOL;  Surgeon: Rachael Fee, MD;  Location: [facility];  Service: Endoscopy;  Laterality: N/A;   ERCP  07/16/2012   ERCP N/A 07/16/2012   Procedure: ENDOSCOPIC RETROGRADE CHOLANGIOPANCREATOGRAPHY (ERCP);  Surgeon: Louis Meckel, MD;  Location: [facility] OR;  Service: Gastroenterology;  Laterality: N/A;   ERCP N/A 10/06/2012   Procedure: ENDOSCOPIC RETROGRADE CHOLANGIOPANCREATOGRAPHY (ERCP);  Surgeon: Louis Meckel, MD;  Location: [facility];  Service: Endoscopy;  Laterality: N/A;   ESOPHAGOGASTRODUODENOSCOPY N/A 03/16/2021   Procedure: ESOPHAGOGASTRODUODENOSCOPY (EGD);  Surgeon: Rachael Fee, MD;  Location: [facility];  Service: Endoscopy;  Laterality: N/A;   EUS N/A 03/16/2021   Procedure: UPPER ENDOSCOPIC ULTRASOUND (EUS) RADIAL;  Surgeon: Rachael Fee, MD;  Location: [facility];  Service: Endoscopy;  Laterality: N/A;   HAND SURGERY     LEFT HEART CATH AND CORONARY ANGIOGRAPHY  N/A 07/14/2020   Procedure: LEFT HEART CATH AND CORONARY ANGIOGRAPHY;  Surgeon: Lyn Records, MD;  Location: [facility];  Service: Cardiovascular; ? Culprit Lesion ~ 80% ostial small OM3 (too small & ostial for PCI). ~30% mid LM (at a bend).  LAD with D1 & D2 - normal, RCA minimal luminal irregularities ~ 30%. PDA   SPHINCTEROTOMY  03/16/2021   Procedure: Dennison Mascot;  Surgeon: Rob Bunting  P, MD;  Location: [facility];  Service: Endoscopy;;   TRANSTHORACIC ECHOCARDIOGRAM  07/14/2020   (NSTEMI/Accelerated Hypertension): EF 55 to 60%.  No RWM A.  Very mild Aortic Stenosis-mean gradient 11 to mmHg.     Immunization History  Administered Date(s) Administered   Influenza,inj,Quad PF,6+ Mos 08/30/2015, 09/20/2017, 09/17/2019, 09/13/2020, 10/30/2021   PFIZER(Purple Top)SARS-COV-2 Vaccination 04/17/2019, 05/11/2019   Tdap 08/30/2015   Zoster Recombinat (Shingrix) 05/31/2020, 10/24/2020    MEDICATIONS/ALLERGIES   Current Meds  Medication Sig   amLODipine (NORVASC) 5 MG tablet Take 1 tablet (5 mg total) by mouth daily.   aspirin 81 MG EC tablet Take 1 tablet (81 mg total) by mouth daily. Swallow whole.   Bempedoic Acid-Ezetimibe (NEXLIZET) 180-10 MG TABS Take 1 tablet by mouth daily.   lisinopril (ZESTRIL) 10 MG tablet Take 1 tablet (10 mg total) by mouth daily.   Multiple Vitamins-Minerals (MULTI FOR HIM) TABS Take 1 tablet by mouth daily.   nitroGLYCERIN (NITROSTAT) 0.4 MG SL tablet Place 1 tablet (0.4 mg total) under the tongue every 5 (five) minutes x 3 doses as needed for chest pain.   polyethylene glycol powder (GLYCOLAX/MIRALAX) 17 GM/SCOOP powder Take 17 g by mouth 2 (two) times daily as needed.   rosuvastatin (CRESTOR) 40 MG tablet Take 1 tablet (40 mg total) by mouth daily.    No Known Allergies  SOCIAL HISTORY/FAMILY HISTORY   Reviewed in Epic:  Pertinent findings:  Social History   Tobacco Use   Smoking status: Never    Passive exposure: Never   Smokeless  tobacco: Never  Vaping Use   Vaping Use: Never used  Substance Use Topics   Alcohol use: No   Drug use: No   Social History   Social History Narrative   Originally from [place], [place]. Came to the [place] in [place].   Lives with his wife and their youngest son   His daughter lives in [place], [place] with her husband.  Second son is now moved out of the house as well.   He gets a decent amount of exercise walking around at work, but does not do routine exercise.  Does not drink or smoke    OBJCTIVE -PE, EKG, labs   Wt Readings from Last 3 Encounters:  11/13/21 166 lb 9.6 oz (75.6 kg)  10/30/21 166 lb 2 oz (75.4 kg)  08/17/21 164 lb (74.4 kg)   Physical Exam: BP (!) 140/102 (BP Location: Left Arm, Patient Position: Sitting, Cuff Size: Large)   Pulse 65   Ht 5\' 3"  (1.6 m)   Wt 166 lb 9.6 oz (75.6 kg)   SpO2 95%   BMI 29.51 kg/m he says his blood pressure is usually high artery. Physical Exam Vitals (Recheck160/90) reviewed.  Constitutional:      General: He is not in acute distress.    Appearance: Normal appearance. He is obese. He is not ill-appearing or toxic-appearing.  HENT:     Head: Normocephalic and atraumatic.  Neck:     Vascular: No carotid bruit or JVD.  Cardiovascular:     Rate and Rhythm: Normal rate and regular rhythm. No extrasystoles are present.    Chest Wall: PMI is not displaced.     Pulses: Normal pulses.     Heart sounds: Normal heart sounds and S1 normal. No murmur heard.    No friction rub. No gallop.  Pulmonary:     Effort: Pulmonary effort is normal. No respiratory distress.     Breath sounds: Normal breath sounds.  No wheezing or rales.  Chest:     Chest wall: No tenderness.  Musculoskeletal:        General: No swelling. Normal range of motion.     Cervical back: Normal range of motion and neck supple.  Skin:    General: Skin is warm and dry.  Neurological:     General: No focal deficit present.     Mental Status: He is alert and oriented to  person, place, and time. Mental status is at baseline.     Gait: Gait normal.  Psychiatric:        Mood and Affect: Mood normal.        Behavior: Behavior normal.        Thought Content: Thought content normal.        Judgment: Judgment normal.     Adult ECG Report N/a  Recent Labs:  reviewed   Lab Results  Component Value Date   CHOL 109 07/12/2021   HDL 40 07/12/2021   LDLCALC 50 07/12/2021   TRIG 102 07/12/2021   CHOLHDL 2.7 07/12/2021   Lab Results  Component Value Date   CREATININE 1.01 08/17/2021   BUN 22 (H) 08/17/2021   NA 140 08/17/2021   K 3.8 08/17/2021   CL 104 08/17/2021   CO2 27 08/17/2021      Latest Ref Rng & Units 08/17/2021   11:26 PM 07/11/2021    1:02 PM 04/23/2021   11:52 AM  CBC  WBC 4.0 - 10.5 K/uL 8.3  7.2  7.7   Hemoglobin 13.0 - 17.0 g/dL 31.5  40.0  86.7   Hematocrit 39.0 - 52.0 % 45.7  45.2  51.2   Platelets 150 - 400 K/uL 206  221  219     Lab Results  Component Value Date   HGBA1C 5.9 (H) 07/14/2020   Lab Results  Component Value Date   TSH 1.970 09/20/2017    ================================================== I spent a total of 26 minutes with the patient spent in direct patient consultation.  Additional time spent with chart review  / charting (studies, outside notes, etc): 18 min Total Time: 42 min  Current medicines are reviewed at length with the patient today.  (+/- concerns) asked if he could potentially switch off of the Protonix.  I think this came from his GI doctor.  Notice: This dictation was prepared with Dragon dictation along with smart phrase technology. Any transcriptional errors that result from this process are unintentional and may not be corrected upon review.  Studies Ordered:   Orders Placed This Encounter  Procedures   Lipid panel   Comprehensive metabolic panel   Hemoglobin A1c   Meds ordered this encounter  Medications   nitroGLYCERIN (NITROSTAT) 0.4 MG SL tablet    Sig: Place 1 tablet (0.4 mg  total) under the tongue every 5 (five) minutes x 3 doses as needed for chest pain.    Dispense:  25 tablet    Refill:  6   pantoprazole (PROTONIX) 40 MG tablet    Sig: Take 1 tablet (40 mg total) by mouth daily.    Dispense:  90 tablet    Refill:  1    Patient Instructions / Medication Changes & Studies & Tests Ordered   Patient Instructions  Medication Instructions:   No changes   You can change to over the counter medication for stomach if needed -- protonix  *If you need a refill on your cardiac medications before your next appointment, please call  your pharmacy*   Lab Work: In Jan or Feb of 2024 Fasting labs - no eating or drinking the morning of the test  CMP LIPID HgBa1c  If you have labs (blood work) drawn today and your tests are completely normal, you will receive your results only by: MyChart Message (if you have MyChart) OR A paper copy in the mail If you have any lab test that is abnormal or we need to change your treatment, we will call you to review the results.   Testing/Procedures:  Not needed  Follow-Up: At [facility], you and your health needs are our priority.  As part of our continuing mission to provide you with exceptional heart care, we have created designated Provider Care Teams.  These Care Teams include your primary Cardiologist (physician) and Advanced Practice Providers (APPs -  Physician Assistants and Nurse Practitioners) who all work together to provide you with the care you need, when you need it.     Your next appointment:   6 month(s)  The format for your next appointment:   In Person  Provider:   Bryan Lemmaavid Payeton Germani, MD    Other Instructions      Marykay Lexavid W. Ruhani Umland, MD, MS Ethan Lemmaavid Tae Robak, M.D., M.S. Interventional Cardiologist  [facility]  Pager # [phone] Phone # [phone] [address] [place], [place] [address]   Thank you for choosing  [facility]!!

## 2021-12-09 ENCOUNTER — Encounter: Payer: Self-pay | Admitting: Cardiology

## 2021-12-09 NOTE — Assessment & Plan Note (Signed)
Labs were just checked in July and his LDL was down to 50 on the combination of 40 mg rosuvastatin and Nexlizet.  Continue current meds.

## 2021-12-09 NOTE — Assessment & Plan Note (Signed)
Appropriately treated along with cardiovascular risk factor modification.

## 2021-12-09 NOTE — Assessment & Plan Note (Signed)
His blood pressure is great with his PCP not that long ago.  Today he is in pain because his knee sent her to a lot of walking and here.  I had a low threshold to increase his amlodipine to 10 mg, but will hold off for now and can simply continue amlodipine at 5 mg and lisinopril at 10 mg.  If pressures continue increase would increase amlodipine first to 10 mg on the off chance that some of his symptoms are indeed anginal in nature since amlodipine works better as an antianginal.

## 2021-12-09 NOTE — Assessment & Plan Note (Signed)
A1c was 5.9 last year.  Monitor diet and increase exercise.  If he does need developed diabetes, low threshold to consider SGLT2 inhibitor.

## 2021-12-09 NOTE — Assessment & Plan Note (Signed)
Initial presentation was with a possible non-STEMI.  He did not have a culprit lesion for PCI, had a small caliber OM1 that was severely diseased.  Not likely causing angina at this point. Preserved EF by Echo.

## 2021-12-09 NOTE — Assessment & Plan Note (Signed)
Interestingly, he told Dr. Alvy Bimler 14 days ago that he was not having chest.  He tells me he is having intermittent episodes of chest pain which to me sounds more musculoskeletal in nature.  There is also some that seems like is more related to GERD.  He says he feels better he burps.  Plan: Off of Plavix, on aspirin alone since he is 1 year out from MI without PCI. On combination of rosuvastatin and Nexlizet. Despite having episodes of chest pain he is not using sublingual nitro. He is on amlodipine 5 mg which I have a low threshold to consider increasing to 10 mg since he is having unusual chest pain and high blood pressure.  He is also on 10 of lisinopril which can be increased as well.   Not on beta-blocker because of concerns of fatigue and bradycardia.

## 2022-01-01 DIAGNOSIS — Z419 Encounter for procedure for purposes other than remedying health state, unspecified: Secondary | ICD-10-CM | POA: Diagnosis not present

## 2022-02-01 DIAGNOSIS — Z419 Encounter for procedure for purposes other than remedying health state, unspecified: Secondary | ICD-10-CM | POA: Diagnosis not present

## 2022-02-08 ENCOUNTER — Telehealth: Payer: Self-pay

## 2022-02-08 NOTE — Telephone Encounter (Signed)
   Pre-operative Risk Assessment    Patient Name: Ethan Silva  DOB: 1965-05-31 MRN: [id number]    Request for Surgical Clearance    Procedure:  Dental Extraction - Amount of Teeth to be Pulled:  1 possibly 3 more wisdom teeth, per clearance letter  Surgical extraction of LL molar #18- may require surgical flap/bone removal, fillings, dental cleaning. Possible extraction of 3 wisdom teeth 3 are bony impacted- will require surgical flap/bone removal   Date of Surgery:  Clearance TBD                                 Surgeon:  Dr. Claudie Revering DMD Surgeon's Group or Practice Name:  [facility] Phone number:  [phone] Fax number:  none listed    Type of Clearance Requested:   - Medical  - Pharmacy:  Hold Aspirin     Type of Anesthesia:   not listed    Additional requests/questions:  Please advise surgeon/provider what medications should be held. Please fax a copy of   to the surgeon's office.  Signed, Belinda Block Chastin Riesgo   02/08/2022, 4:24 PM

## 2022-02-08 NOTE — Telephone Encounter (Signed)
   Name: Ethan Silva  DOB: [birth year]  MRN: [id number]  Primary Cardiologist: Glenetta Hew, MD   Preoperative team, please contact this patient and set up a phone call appointment for further preoperative risk assessment. Please obtain consent and complete medication review. Thank you for your help.  I confirm that guidance regarding antiplatelet and oral anticoagulation therapy has been completed and, if necessary, noted below.  Regarding ASA therapy, we recommend continuation of ASA throughout the perioperative period.  However, if the surgeon feels that cessation of ASA is required in the perioperative period, it may be stopped 5-7 days prior to surgery with a plan to resume it as soon as felt to be feasible from a surgical standpoint in the post-operative period.    Mable Fill, Marissa Nestle, NP 02/08/2022, 4:35 PM [place]

## 2022-02-09 ENCOUNTER — Telehealth: Payer: Self-pay | Admitting: *Deleted

## 2022-02-09 LAB — COMPREHENSIVE METABOLIC PANEL
ALT: 36 IU/L (ref 0–44)
AST: 30 IU/L (ref 0–40)
Albumin/Globulin Ratio: 1.8 (ref 1.2–2.2)
Albumin: 4.6 g/dL (ref 3.8–4.9)
Alkaline Phosphatase: 49 IU/L (ref 44–121)
BUN/Creatinine Ratio: 23 — ABNORMAL HIGH (ref 9–20)
BUN: 21 mg/dL (ref 6–24)
Bilirubin Total: 0.3 mg/dL (ref 0.0–1.2)
CO2: 25 mmol/L (ref 20–29)
Calcium: 9.2 mg/dL (ref 8.7–10.2)
Chloride: 103 mmol/L (ref 96–106)
Creatinine, Ser: 0.9 mg/dL (ref 0.76–1.27)
Globulin, Total: 2.5 g/dL (ref 1.5–4.5)
Glucose: 109 mg/dL — ABNORMAL HIGH (ref 70–99)
Potassium: 4.7 mmol/L (ref 3.5–5.2)
Sodium: 141 mmol/L (ref 134–144)
Total Protein: 7.1 g/dL (ref 6.0–8.5)
eGFR: 99 mL/min/{1.73_m2} (ref >59)

## 2022-02-09 LAB — LIPID PANEL
Chol/HDL Ratio: 4.1 ratio (ref 0.0–5.0)
Cholesterol, Total: 165 mg/dL (ref 100–199)
HDL: 40 mg/dL (ref >39)
LDL Chol Calc (NIH): 99 mg/dL (ref 0–99)
Triglycerides: 148 mg/dL (ref 0–149)
VLDL Cholesterol Cal: 26 mg/dL (ref 5–40)

## 2022-02-09 LAB — HEMOGLOBIN A1C
Est. average glucose Bld gHb Est-mCnc: 134 mg/dL
Hgb A1c MFr Bld: 6.3 % — ABNORMAL HIGH (ref 4.8–5.6)

## 2022-02-09 NOTE — Telephone Encounter (Signed)
I s/w the pt's son who has scheduled a tele pre op appt 02/16/22 @ 10 am for the pt. He will interpret during the call for the pt. Son tells me that the DDS appt is supposed to Monday 02/12/22. I stated the DDS did not give [place] a date of procedure. I informed the pt's son that we will need the tele appt before we can clear him. I will send a note to the DDS office as FYI they will need to post pone procedure until the pt has been cleared.   Med rec and consent are done.

## 2022-02-09 NOTE — Telephone Encounter (Signed)
I s/w the pt's son who has scheduled a tele pre op appt 02/16/22 @ 10 am for the pt. He will interpret during the call for the pt. Son tells me that the DDS appt is supposed to Monday 02/12/22. I stated the DDS did not give [place] a date of procedure. I informed the pt's son that we will need the tele appt before we can clear him. I will send a note to the DDS office as FYI they will need to post pone procedure until the pt has been cleared.   Med rec and consent are done.     Patient Consent for Virtual Visit        Ethan Silva has provided verbal consent on 02/09/2022 for a virtual visit (video or telephone).   CONSENT FOR VIRTUAL VISIT FOR:  Ethan Silva  By participating in this virtual visit I agree to the following:  I hereby voluntarily request, consent and authorize [facility] and its employed or contracted physicians, physician assistants, nurse practitioners or other licensed health care professionals (the Practitioner), to provide me with telemedicine health care services (the "Services") as deemed necessary by the treating Practitioner. I acknowledge and consent to receive the Services by the Practitioner via telemedicine. I understand that the telemedicine visit will involve communicating with the Practitioner through live audiovisual communication technology and the disclosure of certain medical information by electronic transmission. I acknowledge that I have been given the opportunity to request an in-person assessment or other available alternative prior to the telemedicine visit and am voluntarily participating in the telemedicine visit.  I understand that I have the right to withhold or withdraw my consent to the use of telemedicine in the course of my care at any time, without affecting my right to future care or treatment, and that the Practitioner or I may terminate the telemedicine visit at any time. I understand that I have the right to  inspect all information obtained and/or recorded in the course of the telemedicine visit and may receive copies of available information for a reasonable fee.  I understand that some of the potential risks of receiving the Services via telemedicine include:  Delay or interruption in medical evaluation due to technological equipment failure or disruption; Information transmitted may not be sufficient (e.g. poor resolution of images) to allow for appropriate medical decision making by the Practitioner; and/or  In rare instances, security protocols could fail, causing a breach of personal health information.  Furthermore, I acknowledge that it is my responsibility to provide information about my medical history, conditions and care that is complete and accurate to the best of my ability. I acknowledge that Practitioner's advice, recommendations, and/or decision may be based on factors not within their control, such as incomplete or inaccurate data provided by me or distortions of diagnostic images or specimens that may result from electronic transmissions. I understand that the practice of medicine is not an exact science and that Practitioner makes no warranties or guarantees regarding treatment outcomes. I acknowledge that a copy of this consent can be made available to me via my patient portal ([facility]), or I can request a printed copy by calling the office of [facility].    I understand that my insurance will be billed for this visit.   I have read or had this consent read to me. I understand the contents of this consent, which adequately explains the benefits and risks of the Services being provided via telemedicine.  I have been provided ample opportunity to ask questions regarding this consent and the Services and have had my questions answered to my satisfaction. I give my informed consent for the services to be provided through the use of telemedicine in my medical  care

## 2022-02-16 ENCOUNTER — Ambulatory Visit: Payer: Medicaid Other | Attending: Internal Medicine | Admitting: Student

## 2022-02-16 DIAGNOSIS — Z01810 Encounter for preprocedural cardiovascular examination: Secondary | ICD-10-CM

## 2022-02-16 NOTE — Progress Notes (Addendum)
Virtual Visit via Telephone Note   Because of Ethan Silva's co-morbid illnesses, Ethan Silva is at least at moderate risk for complications without adequate follow up.  This format is felt to be most appropriate for this patient at this time.  The patient did not have access to video technology/had technical difficulties with video requiring transitioning to audio format only (telephone).  All issues noted in this document were discussed and addressed.  No physical exam could be performed with this format.  Please refer to the patient's chart for his consent to telehealth for [facility].  Evaluation Performed:  Preoperative cardiovascular risk assessment _____________   Date:  02/16/2022   [name], Ethan Silva 1965-07-20, MRN [id number] Patient Location:  Home Provider location:   Office  Primary Care Provider:  Horald Pollen, MD Primary Cardiologist:  Glenetta Hew, MD  Chief Complaint / Patient Profile   57 y.o. y/o male with a h/o CAD per angiography risk factor modification/medical treatment, hypertension, hyperlipidemia, aortic atherosclerosis, prediabetes who is pending extraction of 1-3 teeth (wisdom teeth with possible impaction and presents today for telephonic preoperative cardiovascular risk assessment.  History of Present Illness    Ethan Silva is a 57 y.o. male who presents via audio/video conferencing for a telehealth visit today.  Pt was last seen in cardiology clinic on 11/13/2021 by Dr. Ellyn Hack.  At that time Ethan Silva was doing well.  The patient is now pending procedure as outlined above. Since his last visit, Ethan Silva is doing well. Patient denies shortness of breath or dyspnea on exertion. No chest pain, pressure, or tightness. Ethan Silva walks 20 minutes every day for exercise without difficulty.   Past Medical History    Past Medical History:  Diagnosis Date   Aortic stenosis, mild 07/2012    Very mild aortic stenosis noted on echo-mean gradient 11 mmHg.   Bile duct leak    CAD in native artery 07/14/2020   Small branch of OM 3 with 80% ostial stenosis.  Too small for PCI.  Otherwise questionable 30% LM and 30% PDA.   Chronic kidney disease 2017   left kidney stones   Headache(784.0)    Hx: of when BP is elevated   Hyperlipidemia    Hypertension    Non-STEMI (non-ST elevated myocardial infarction) ([facility]) 07/13/2020   Admitted with hypertension and chest pain.  Cardiac cath showed small branch of an OM3 with 85% ostial stenosis not amenable to PCI.  No other significant disease noted.  Normal EF on echo.  Medical management.   Numbness and tingling in hands    Hx: of   Prediabetes    Past Surgical History:  Procedure Laterality Date   BILIARY DILATION  03/16/2021   Procedure: BILIARY DILATION;  Surgeon: Milus Banister, MD;  Location: [facility];  Service: Endoscopy;;   CHOLECYSTECTOMY N/A 07/11/2012   Procedure: LAPAROSCOPIC CHOLECYSTECTOMY WITH INTRAOPERATIVE CHOLANGIOGRAM;  Surgeon: Ralene Ok, MD;  Location: [facility];  Service: General;  Laterality: N/A;   COLONOSCOPY     ENDOSCOPIC RETROGRADE CHOLANGIOPANCREATOGRAPHY (ERCP) WITH PROPOFOL N/A 03/16/2021   Procedure: ENDOSCOPIC RETROGRADE CHOLANGIOPANCREATOGRAPHY (ERCP) WITH PROPOFOL;  Surgeon: Milus Banister, MD;  Location: [facility];  Service: Endoscopy;  Laterality: N/A;   ERCP  07/16/2012   ERCP N/A 07/16/2012   Procedure: ENDOSCOPIC RETROGRADE CHOLANGIOPANCREATOGRAPHY (ERCP);  Surgeon: Inda Castle, MD;  Location: [place] [facility];  Service: Gastroenterology;  Laterality: N/A;   ERCP N/A 10/06/2012   Procedure: ENDOSCOPIC RETROGRADE  CHOLANGIOPANCREATOGRAPHY (ERCP);  Surgeon: Inda Castle, MD;  Location: [facility];  Service: Endoscopy;  Laterality: N/A;   ESOPHAGOGASTRODUODENOSCOPY N/A 03/16/2021   Procedure: ESOPHAGOGASTRODUODENOSCOPY (EGD);  Surgeon: Milus Banister, MD;  Location: [facility];  Service:  Endoscopy;  Laterality: N/A;   EUS N/A 03/16/2021   Procedure: UPPER ENDOSCOPIC ULTRASOUND (EUS) RADIAL;  Surgeon: Milus Banister, MD;  Location: [facility];  Service: Endoscopy;  Laterality: N/A;   HAND SURGERY     LEFT HEART CATH AND CORONARY ANGIOGRAPHY N/A 07/14/2020   Procedure: LEFT HEART CATH AND CORONARY ANGIOGRAPHY;  Surgeon: Belva Crome, MD;  Location: [facility];  Service: Cardiovascular; ? Culprit Lesion ~ 80% ostial small OM3 (too small & ostial for PCI). ~30% mid LM (at a bend).  LAD with D1 & D2 - normal, RCA minimal luminal irregularities ~ 30%. PDA   SPHINCTEROTOMY  03/16/2021   Procedure: SPHINCTEROTOMY;  Surgeon: Milus Banister, MD;  Location: [facility];  Service: Endoscopy;;   TRANSTHORACIC ECHOCARDIOGRAM  07/14/2020   (NSTEMI/Accelerated Hypertension): EF 55 to 60%.  No RWM A.  Very mild Aortic Stenosis-mean gradient 11 to mmHg.    Allergies  No Known Allergies  Home Medications    Prior to Admission medications   Medication Sig Start Date End Date Taking? Authorizing Provider  acetaminophen (TYLENOL) 325 MG tablet Take 325 mg by mouth every 6 (six) hours as needed for moderate pain. Patient not taking: Reported on 11/13/2021    [provider]  amLODipine (NORVASC) 5 MG tablet Take 1 tablet (5 mg total) by mouth daily. 02/01/21   Warren Lacy, PA-C  aspirin 81 MG EC tablet Take 1 tablet (81 mg total) by mouth daily. Swallow whole. 07/15/20   O'NealCassie Freer, MD  Bempedoic Acid-Ezetimibe (NEXLIZET) 180-10 MG TABS Take 1 tablet by mouth daily. 04/04/21   Warren Lacy, PA-C  HYDROcodone-acetaminophen (NORCO/VICODIN) 5-325 MG tablet Take 1 tablet by mouth every 6 (six) hours as needed for severe pain. 08/12/21   Truddie Hidden, MD  lisinopril (ZESTRIL) 10 MG tablet Take 1 tablet (10 mg total) by mouth daily. 10/30/21 10/25/22  Horald Pollen, MD  Multiple Vitamins-Minerals [facility] FOR HIM) TABS Take 1 tablet by mouth  daily. 03/31/21   Warren Lacy, PA-C  nitroGLYCERIN (NITROSTAT) 0.4 MG SL tablet Place 1 tablet (0.4 mg total) under the tongue every 5 (five) minutes x 3 doses as needed for chest pain. 11/13/21   Leonie Man, MD  ondansetron (ZOFRAN ODT) 4 MG disintegrating tablet Take 1 tablet (4 mg total) by mouth every 8 (eight) hours as needed for nausea or vomiting. Patient not taking: Reported on 11/13/2021 08/27/20   Sherwood Gambler, MD  pantoprazole (PROTONIX) 40 MG tablet Take 1 tablet (40 mg total) by mouth daily. 11/13/21   Leonie Man, MD  polyethylene glycol powder [facility]) 17 GM/SCOOP powder Take 17 g by mouth 2 (two) times daily as needed. 10/11/20   Biagio Borg, MD  rosuvastatin (CRESTOR) 40 MG tablet Take 1 tablet (40 mg total) by mouth daily. 10/30/21 10/25/22  Horald Pollen, MD    Physical Exam    Vital Signs:  Ethan Silva does not have vital signs available for review today.  Given telephonic nature of communication, physical exam is limited. AAOx3. NAD. Normal affect.  Speech and respirations are unlabored.  Accessory Clinical Findings    None  Assessment & Plan    Primary Cardiologist: Glenetta Hew,  MD  Preoperative cardiovascular risk assessment. Extraction of 1-3 teeth (wisdom teeth with possible impaction.  Chart reviewed as part of pre-operative protocol coverage. Patient does not have a history of PCI, heart failure, or stroke. According to the RCRI, patient has a 0.9% risk of MACE. Patient reports activity equivalent to 4.0 METS (walks 20 minutes every day).   Given past medical history and time since last visit, based on ACC/AHA guidelines, Ethan Silva would be at acceptable risk for the planned procedure without further cardiovascular testing.   Patient was advised that if Ethan Silva develops new symptoms prior to surgery to contact our office to arrange a follow-up appointment.  Ethan Silva verbalized  understanding.  Given the patient's comorbid conditions, we prefer to continue ASA throughout the perioperative period. Given that this procedure is considered moderate bleeding risk, surgeon may hold 5-7 days prior to procedure if deemed necessary for increased bleeding risk.   I will route this recommendation to the requesting party via Epic fax function.  Please call with questions.  Time:   Today, I have spent 12 minutes with the patient with telehealth technology discussing medical history, symptoms, and management plan.  Assisted by interpreter service provider Cletus Gash [phone]).   Mayra Reel, NP  02/16/2022, 10:06 AM

## 2022-02-16 NOTE — Progress Notes (Signed)
Dental office called back and gave me a fax # after I had a difficult time with their office just to get a  fax #.   Fax # [phone]. I will fax notes over today.

## 2022-03-02 DIAGNOSIS — Z419 Encounter for procedure for purposes other than remedying health state, unspecified: Secondary | ICD-10-CM | POA: Diagnosis not present

## 2022-03-21 ENCOUNTER — Ambulatory Visit (INDEPENDENT_AMBULATORY_CARE_PROVIDER_SITE_OTHER): Payer: 59 | Admitting: Emergency Medicine

## 2022-03-21 ENCOUNTER — Ambulatory Visit (INDEPENDENT_AMBULATORY_CARE_PROVIDER_SITE_OTHER): Payer: 59

## 2022-03-21 ENCOUNTER — Encounter: Payer: Self-pay | Admitting: Emergency Medicine

## 2022-03-21 VITALS — BP 130/70 | HR 60 | Temp 98.0°F | Ht 63.0 in | Wt 167.0 lb

## 2022-03-21 DIAGNOSIS — I25119 Atherosclerotic heart disease of native coronary artery with unspecified angina pectoris: Secondary | ICD-10-CM

## 2022-03-21 DIAGNOSIS — I700 Atherosclerosis of aorta: Secondary | ICD-10-CM

## 2022-03-21 DIAGNOSIS — M7918 Myalgia, other site: Secondary | ICD-10-CM

## 2022-03-21 DIAGNOSIS — R7303 Prediabetes: Secondary | ICD-10-CM | POA: Diagnosis not present

## 2022-03-21 DIAGNOSIS — I10 Essential (primary) hypertension: Secondary | ICD-10-CM | POA: Diagnosis not present

## 2022-03-21 DIAGNOSIS — E785 Hyperlipidemia, unspecified: Secondary | ICD-10-CM

## 2022-03-21 DIAGNOSIS — M79671 Pain in right foot: Secondary | ICD-10-CM

## 2022-03-21 DIAGNOSIS — G8929 Other chronic pain: Secondary | ICD-10-CM

## 2022-03-21 DIAGNOSIS — S39012A Strain of muscle, fascia and tendon of lower back, initial encounter: Secondary | ICD-10-CM

## 2022-03-21 DIAGNOSIS — M7731 Calcaneal spur, right foot: Secondary | ICD-10-CM

## 2022-03-21 MED ORDER — LISINOPRIL 20 MG PO TABS: 20.0000 mg | ORAL_TABLET | Freq: Every day | ORAL | 3 refills | Status: DC

## 2022-03-21 MED ORDER — CYCLOBENZAPRINE HCL 10 MG PO TABS: 10.0000 mg | ORAL_TABLET | Freq: Every day | ORAL | 0 refills | Status: DC

## 2022-03-21 NOTE — Assessment & Plan Note (Signed)
Stable.  No anginal episodes recently. No recent use of sublingual nitroglycerin Continues daily baby aspirin

## 2022-03-21 NOTE — Assessment & Plan Note (Signed)
Creating lumbar pain and affecting quality of life Most likely related to activities of daily living Mechanical in nature Clinically stable.  No neurological symptoms. No red flag signs or symptoms. X-ray done today.  Report reviewed.  Chronic degenerative changes noted. Pain management discussed.  Recommend Tylenol as needed May need referral to Ortho if no better over the next couple of weeks

## 2022-03-21 NOTE — Progress Notes (Signed)
[address] [place] 57 y.o.   Chief Complaint  Patient presents with   Back Pain    Started 3 weeks a go     HISTORY OF PRESENT ILLNESS: This is a 57 y.o. male complaining of lumbar pain on and off for 3 weeks Works as a Curator.  Physical work. Also complaining of chronic right foot pain Denies any bladder or bowel symptoms Pain is sharp and waxes and wanes.  No radiation.  Better with rest.  No associated symptoms. No other complaints or medical concerns today.  Back Pain Pertinent negatives include no abdominal pain, chest pain, dysuria, fever or headaches.     Prior to Admission medications   Medication Sig Start Date End Date Taking? Authorizing Provider  amLODipine (NORVASC) 5 MG tablet Take 1 tablet (5 mg total) by mouth daily. 02/01/21  Yes Warren Lacy, PA-C  aspirin 81 MG EC tablet Take 1 tablet (81 mg total) by mouth daily. Swallow whole. 07/15/20  Yes O'Neal, Cassie Freer, MD  Bempedoic Acid-Ezetimibe (NEXLIZET) 180-10 MG TABS Take 1 tablet by mouth daily. 04/04/21  Yes [name] K, PA-C  lisinopril (ZESTRIL) 10 MG tablet Take 1 tablet (10 mg total) by mouth daily. 10/30/21 10/25/22 Yes Emmerie Battaglia, Ines Bloomer, MD  Multiple Vitamins-Minerals [facility]) TABS Take 1 tablet by mouth daily. 03/31/21  Yes Warren Lacy, PA-C  nitroGLYCERIN (NITROSTAT) 0.4 MG SL tablet Place 1 tablet (0.4 mg total) under the tongue every 5 (five) minutes x 3 doses as needed for chest pain. 11/13/21  Yes Leonie Man, MD  pantoprazole (PROTONIX) 40 MG tablet Take 1 tablet (40 mg total) by mouth daily. 11/13/21  Yes Leonie Man, MD  polyethylene glycol powder [facility]) 17 GM/SCOOP powder Take 17 g by mouth 2 (two) times daily as needed. 10/11/20  Yes Biagio Borg, MD  rosuvastatin (CRESTOR) 40 MG tablet Take 1 tablet (40 mg total) by mouth daily. 10/30/21 10/25/22 Yes Kailen Name, Ines Bloomer, MD  acetaminophen (TYLENOL) 325 MG tablet Take 325 mg by  mouth every 6 (six) hours as needed for moderate pain. Patient not taking: Reported on 03/21/2022    [provider]  HYDROcodone-acetaminophen (NORCO/VICODIN) 5-325 MG tablet Take 1 tablet by mouth every 6 (six) hours as needed for severe pain. Patient not taking: Reported on 03/21/2022 08/12/21   Truddie Hidden, MD  ondansetron (ZOFRAN ODT) 4 MG disintegrating tablet Take 1 tablet (4 mg total) by mouth every 8 (eight) hours as needed for nausea or vomiting. Patient not taking: Reported on 11/13/2021 08/27/20   Sherwood Gambler, MD    No Known Allergies  Patient Active Problem List   Diagnosis Date Noted   Chronic foot pain, right 10/30/2021   Calcaneal spur of right foot 10/30/2021   Aortic atherosclerosis ([place]) 04/25/2021   Coronary artery disease involving native coronary artery of native heart with angina pectoris ([place]) 07/15/2020   Prediabetes 07/15/2020   BMI 30.0-30.9,adult 08/30/2015   Hyperlipidemia with target LDL less than 70 08/30/2015   Essential hypertension 10/11/2012    Past Medical History:  Diagnosis Date   Aortic stenosis, mild 07/2012   Very mild aortic stenosis noted on echo-mean gradient 11 mmHg.   Bile duct leak    CAD in native artery 07/14/2020   Small branch of OM 3 with 80% ostial stenosis.  Too small for PCI.  Otherwise questionable 30% LM and 30% PDA.   Chronic kidney disease 2017   left kidney stones  Headache(784.0)    Hx: of when BP is elevated   Hyperlipidemia    Hypertension    Non-STEMI (non-ST elevated myocardial infarction) ([place]) 07/13/2020   Admitted with hypertension and chest pain.  Cardiac cath showed small branch of an OM3 with 85% ostial stenosis not amenable to PCI.  No other significant disease noted.  Normal EF on echo.  Medical management.   Numbness and tingling in hands    Hx: of   Prediabetes     Past Surgical History:  Procedure Laterality Date   BILIARY DILATION  03/16/2021   Procedure: BILIARY DILATION;   Surgeon: Milus Banister, MD;  Location: [facility];  Service: Endoscopy;;   CHOLECYSTECTOMY N/A 07/11/2012   Procedure: LAPAROSCOPIC CHOLECYSTECTOMY WITH INTRAOPERATIVE CHOLANGIOGRAM;  Surgeon: Ralene Ok, MD;  Location: [facility];  Service: General;  Laterality: N/A;   COLONOSCOPY     ENDOSCOPIC RETROGRADE CHOLANGIOPANCREATOGRAPHY (ERCP) WITH PROPOFOL N/A 03/16/2021   Procedure: ENDOSCOPIC RETROGRADE CHOLANGIOPANCREATOGRAPHY (ERCP) WITH PROPOFOL;  Surgeon: Milus Banister, MD;  Location: [facility];  Service: Endoscopy;  Laterality: N/A;   ERCP  07/16/2012   ERCP N/A 07/16/2012   Procedure: ENDOSCOPIC RETROGRADE CHOLANGIOPANCREATOGRAPHY (ERCP);  Surgeon: Inda Castle, MD;  Location: [facility];  Service: Gastroenterology;  Laterality: N/A;   ERCP N/A 10/06/2012   Procedure: ENDOSCOPIC RETROGRADE CHOLANGIOPANCREATOGRAPHY (ERCP);  Surgeon: Inda Castle, MD;  Location[facility];  Service: Endoscopy;  Laterality: N/A;   ESOPHAGOGASTRODUODENOSCOPY N/A 03/16/2021   Procedure: ESOPHAGOGASTRODUODENOSCOPY (EGD);  Surgeon: Milus Banister, MD;  Location: [facility];  Service: Endoscopy;  Laterality: N/A;   EUS N/A 03/16/2021   Procedure: UPPER ENDOSCOPIC ULTRASOUND (EUS) RADIAL;  Surgeon: Milus Banister, MD;  Location: [facility];  Service: Endoscopy;  Laterality: N/A;   HAND SURGERY     LEFT HEART CATH AND CORONARY ANGIOGRAPHY N/A 07/14/2020   Procedure: LEFT HEART CATH AND CORONARY ANGIOGRAPHY;  Surgeon: Belva Crome, MD;  Location: [facility];  Service: Cardiovascular; ? Culprit Lesion ~ 80% ostial small OM3 (too small & ostial for PCI). ~30% mid LM (at a bend).  LAD with D1 & D2 - normal, RCA minimal luminal irregularities ~ 30%. PDA   SPHINCTEROTOMY  03/16/2021   Procedure: SPHINCTEROTOMY;  Surgeon: Milus Banister, MD;  Location: [facility];  Service: Endoscopy;;   TRANSTHORACIC ECHOCARDIOGRAM  07/14/2020   (NSTEMI/Accelerated Hypertension): EF 55 to 60%.  No RWM A.   Very mild Aortic Stenosis-mean gradient 11 to mmHg.    Social History   Socioeconomic History   Marital status: Married    Spouse name: Alyson Locket   Number of children: 3   Years of education: 6th grade   Highest education level: Not on file  Occupational History   Occupation: Painting    Employer: [employer], [place], [place]    Comment: Mostly indoor painting  Tobacco Use   Smoking status: Never    Passive exposure: Never   Smokeless tobacco: Never  Vaping Use   Vaping Use: Never used  Substance and Sexual Activity   Alcohol use: No   Drug use: No   Sexual activity: Yes    Partners: Female  Other Topics Concern   Not on file  Social History Narrative   Originally from [place], [place]. [place] to the [place] in [place].   Lives with his wife and their youngest son   His daughter lives in [place], [place] with her husband.  Second son is now moved out of the house as  well.   He gets a decent amount of exercise walking around at work, but does not do routine exercise.  Does not drink or smoke   Social Determinants of Health   Financial Resource Strain: Not on file  Food Insecurity: Not on file  Transportation Needs: Not on file  Physical Activity: Not on file  Stress: Not on file  Social Connections: Not on file  Intimate Partner Violence: Not on file    Family History  Problem Relation Age of Onset   Gallstones Mother    Hypertension Mother    Gallstones Brother    Diabetes Paternal Uncle      Review of Systems  Constitutional: Negative.  Negative for chills and fever.  HENT: Negative.  Negative for congestion and sore throat.   Respiratory: Negative.  Negative for cough and shortness of breath.   Cardiovascular: Negative.  Negative for chest pain and palpitations.  Gastrointestinal:  Negative for abdominal pain, diarrhea, nausea and vomiting.  Genitourinary: Negative.  Negative for dysuria and hematuria.  Musculoskeletal:  Positive for back pain.   Skin: Negative.  Negative for rash.  Neurological: Negative.  Negative for dizziness and headaches.  All other systems reviewed and are negative.   Vitals:   03/21/22 0936  BP: (!) 148/90  Pulse: 60  Temp: 98 F (36.7 C)  SpO2: 96%    Physical Exam Vitals reviewed.  Constitutional:      Appearance: Normal appearance.  HENT:     Head: Normocephalic.  Eyes:     Extraocular Movements: Extraocular movements intact.     Pupils: Pupils are equal, round, and reactive to light.  Cardiovascular:     Rate and Rhythm: Normal rate and regular rhythm.     Pulses: Normal pulses.     Heart sounds: Normal heart sounds.  Pulmonary:     Effort: Pulmonary effort is normal.     Breath sounds: Normal breath sounds.  Abdominal:     Palpations: Abdomen is soft.     Tenderness: There is no abdominal tenderness.  Musculoskeletal:     Cervical back: No tenderness.     Comments: Back: No spine tenderness.  Mild paraspinal muscle tenderness to lumbar sacral area  Lymphadenopathy:     Cervical: No cervical adenopathy.  Skin:    General: Skin is warm and dry.  Neurological:     General: No focal deficit present.     Mental Status: He is alert and oriented to person, place, and time.     Sensory: No sensory deficit.     Motor: No weakness.     Coordination: Coordination normal.     Gait: Gait normal.     Deep Tendon Reflexes: Reflexes normal.  Psychiatric:        Mood and Affect: Mood normal.        Behavior: Behavior normal.     DG Lumbar Spine 2-3 Views  Result Date: 03/21/2022 CLINICAL DATA:  Lumbosacral pain. EXAM: LUMBAR SPINE - 2-3 VIEW COMPARISON:  None Available. FINDINGS: There is no evidence of lumbar spine fracture. Alignment is normal. Mild degenerative disc disease is noted at L1-2 and L2-3 with anterior osteophyte formation. IMPRESSION: Mild multilevel degenerative changes.  No acute abnormality seen. Electronically Signed   By: Marijo Conception M.D.   On: 03/21/2022 10:17     ASSESSMENT & PLAN: A total of 46 minutes was spent with the patient and counseling/coordination of care regarding preparing for this visit, review of most recent office visit  notes, review of multiple chronic medical conditions and their management, review of all medications and changes made, diagnosis of lumbosacral strain and pain management, review of x-ray done today, education on nutrition, cardiovascular risks associated with hypertension, prognosis, documentation, and need for follow-up.  Problem List Items Addressed This Visit       Cardiovascular and Mediastinum   Essential hypertension (Chronic)    Elevated blood pressure reading in the office but normal at home. Cardiovascular risks associated with hypertension discussed Dietary approaches to stop hypertension discussed Recommend to increase lisinopril to 20 mg daily      Relevant Medications   lisinopril (ZESTRIL) 20 MG tablet   Coronary artery disease involving native coronary artery of native heart with angina pectoris (HCC) (Chronic)    Stable.  No anginal episodes recently. No recent use of sublingual nitroglycerin Continues daily baby aspirin      Relevant Medications   lisinopril (ZESTRIL) 20 MG tablet   Aortic atherosclerosis (HCC) (Chronic)    Diet and nutrition discussed. Continue rosuvastatin 40 mg daily. Continue daily baby aspirin      Relevant Medications   lisinopril (ZESTRIL) 20 MG tablet     Musculoskeletal and Integument   Calcaneal spur of right foot    Creating chronic right foot pain.  Needs podiatry evaluation.  Referral placed today.      Lumbosacral strain - Primary    Creating lumbar pain and affecting quality of life Most likely related to activities of daily living Mechanical in nature Clinically stable.  No neurological symptoms. No red flag signs or symptoms. X-ray done today.  Report reviewed.  Chronic degenerative changes noted. Pain management discussed.  Recommend Tylenol as  needed May need referral to Ortho if no better over the next couple of weeks      Relevant Medications   cyclobenzaprine (FLEXERIL) 10 MG tablet   Other Relevant Orders   DG Lumbar Spine 2-3 Views (Completed)     Other   Hyperlipidemia with target LDL less than 70 (Chronic)    Diet and nutrition discussed Recommend to decrease amount of daily carbohydrate intake and daily calories and increase amount of plant-based protein in his diet Continue rosuvastatin 40 mg daily      Relevant Medications   lisinopril (ZESTRIL) 20 MG tablet   Prediabetes (Chronic)    Diet and nutrition discussed      Chronic foot pain, right   Relevant Medications   cyclobenzaprine (FLEXERIL) 10 MG tablet   Other Relevant Orders   Ambulatory referral to Podiatry   Musculoskeletal pain    Pain management discussed Related to activities of daily living Recommend to use heat pad several times a day Tylenol for pain as needed May need physical therapy in the future      Relevant Medications   cyclobenzaprine (FLEXERIL) 10 MG tablet   Other Relevant Orders   DG Lumbar Spine 2-3 Views (Completed)   Patient Instructions  Dolor de espalda agudo en los adultos Acute Back Pain, Adult El dolor de espalda agudo es repentino y por lo general no dura mucho tiempo. Se debe generalmente a una lesin de los msculos y tejidos de la espalda. La lesin puede ser el resultado de: Estiramiento en exceso o desgarro de un msculo, tendn o ligamento. Los ligamentos son tejidos que [employer]. Levantar algo de forma incorrecta puede producir un esguince de espalda. Desgaste (degeneracin) de los discos vertebrales. Los discos vertebrales son tejidos circulares que proporcionan amortiguacin entre los [place] de  la columna vertebral (vrtebras). Movimientos de giro, como al practicar deportes o realizar trabajos de [place]. Un golpe en la espalda. Artritis. Es posible Hydrologist un examen fsico, anlisis  de laboratorio u otros estudios de diagnstico por imgenes para Animator causa del Social research officer, government. El dolor de espalda agudo generalmente desaparece con reposo y cuidados en la casa. Siga estas instrucciones en su casa: Control del dolor, la rigidez y Forensic psychologist los medicamentos de venta libre y los recetados solamente como se lo haya indicado el mdico. El tratamiento puede incluir medicamentos para Conservation officer, historic buildings y la inflamacin que se toman por la boca o que se aplican sobre la piel, o relajantes musculares. El mdico puede recomendarle que se aplique hielo durante las primeras 24 a 71 horas despus del comienzo del Social research officer, government. Para hacer esto: Ponga el hielo en una bolsa plstica. Coloque una toalla entre la piel y Therapist, nutritional. Aplique el hielo durante 20 minutos, 2 o 3 veces por da. Retire el hielo si la piel se pone de color rojo brillante. Esto es [employer]. Si no puede sentir dolor, calor o fro, tiene un mayor riesgo de que se dae la zona. Si se lo indican, aplique calor en la zona afectada con la frecuencia que le haya indicado el mdico. Use la fuente de calor que el mdico le recomiende, como una compresa de calor hmedo o una almohadilla trmica. Coloque una toalla entre la piel y la fuente de Freight forwarder. Aplique calor durante 20 a 30 minutos. Retire la fuente de calor si la piel se pone de color rojo brillante. Esto es especialmente importante si no puede sentir dolor, calor o fro. Corre un mayor riesgo de sufrir quemaduras. Actividad  No permanezca en la cama. Hacer reposo en la cama por ms de 1 a 2 das puede demorar su recuperacin. Mantenga una buena postura al sentarse y pararse. No se incline hacia adelante al sentarse ni se encorve al pararse. Si trabaja en un escritorio, sintese cerca de este para no tener que inclinarse. Mantenga el mentn hacia abajo. Mantenga el cuello hacia atrs y los codos flexionados en un ngulo de 90 grados (ngulo recto). Cuando conduzca, sintese elevado  y cerca del volante. Agregue un apoyo para la espalda (lumbar) al asiento del automvil, si es necesario. Realice caminatas cortas en superficies planas tan pronto como le sea posible. Trate de caminar un poco ms de Publishing copy. No se siente, conduzca o permanezca de pie en un mismo lugar durante ms de 30 minutos seguidos. Pararse o sentarse durante largos perodos de Radiographer, therapeutic la espalda. No conduzca ni use maquinaria pesada mientras toma analgsicos recetados. Use tcnicas apropiadas para levantar objetos. Cuando se inclina y Chief Executive Officer un [place], utilice posiciones que no sobrecarguen tanto la espalda: [place]. Mantenga la carga cerca del cuerpo. No se tuerza. Haga actividad fsica habitualmente como se lo haya indicado el mdico. Hacer ejercicios ayuda a que la espalda sane ms rpido y [place] a Product/process development scientist las lesiones de la espalda al [employer] msculos fuertes y flexibles. Trabaje con un fisioterapeuta para crear un programa de ejercicios seguros, segn lo recomiende el mdico. Haga ejercicios como se lo haya indicado el fisioterapeuta. Estilo de vida Mantenga un peso saludable. El sobrepeso sobrecarga la espalda y hace que resulte difcil tener una buena [place]. Evite actividades o situaciones que lo hagan sentirse ansioso o estresado. El estrs y la ansiedad aumentan la tensin muscular y pueden empeorar el dolor de  espalda. Aprenda formas de [employer] ansiedad y [place], como a travs del ejercicio. Instrucciones generales [place] sobre un colchn firme en una posicin cmoda. Intente acostarse de costado, con las rodillas ligeramente flexionadas. Si se recuesta [employer], coloque una almohada debajo de las rodillas. Mantenga la cabeza y el cuello en lnea recta con la columna vertebral (posicin neutra) cuando use equipos electrnicos como telfonos inteligentes o tablets. Para hacer esto: Levante el telfono inteligente o la tablet para Research officer, trade union de inclinar la cabeza o el cuello para mirar hacia abajo. Coloque el telfono inteligente o la tablet al nivel de su cara mientras mira la pantalla. Siga el plan de tratamiento como se lo haya indicado el mdico. Esto puede incluir: Terapia cognitiva o conductual. Acupuntura o terapia de masajes. Yoga o meditacin. Comunquese con un mdico si: Siente un dolor que no se alivia con reposo o medicamentos. Siente mucho dolor que se extiende a las piernas o las nalgas. El dolor no mejora luego de 2 semanas. Siente dolor por la noche. Pierde peso sin proponrselo. Tiene fiebre o escalofros. Siente nuseas o vmitos. Siente dolor abdominal. Solicite ayuda de inmediato si: Tiene nuevos problemas para controlar la vejiga o los intestinos. Siente debilidad o adormecimiento inusuales en los brazos o en las piernas. Siente que va a desmayarse. Estos sntomas pueden representar un problema grave que constituye Engineer, maintenance (IT). No espere a ver si los sntomas desaparecen. Solicite atencin mdica de inmediato. Comunquese con el servicio de emergencias de su localidad (911 en los [place]). No conduzca por sus propios medios [employer] Financial. Resumen El dolor de espalda agudo es repentino y por lo general no dura mucho tiempo. Use tcnicas apropiadas para levantar objetos. Cuando se inclina y levanta un [place], utilice posiciones que no sobrecarguen tanto la espalda. Tome los medicamentos de venta libre y los recetados solamente como se lo haya indicado el mdico, y aplquese calor o hielo segn las indicaciones. Esta informacin no tiene Marine scientist el consejo del mdico. Asegrese de hacerle al mdico cualquier pregunta que tenga. Document Revised: 04/06/2020 Document Reviewed: 04/06/2020 Elsevier Patient Education  [place], MD [facility]

## 2022-03-21 NOTE — Assessment & Plan Note (Signed)
Diet and nutrition discussed Recommend to decrease amount of daily carbohydrate intake and daily calories and increase amount of plant-based protein in his diet Continue rosuvastatin 40 mg daily

## 2022-03-21 NOTE — Assessment & Plan Note (Signed)
Creating chronic right foot pain.  Needs podiatry evaluation.  Referral placed today.

## 2022-03-21 NOTE — Assessment & Plan Note (Signed)
Pain management discussed Related to activities of daily living Recommend to use heat pad several times a day Tylenol for pain as needed May need physical therapy in the future

## 2022-03-21 NOTE — Assessment & Plan Note (Addendum)
Diet and nutrition discussed. Continue rosuvastatin 40 mg daily. Continue daily baby aspirin

## 2022-03-21 NOTE — Assessment & Plan Note (Signed)
Diet and nutrition discussed. 

## 2022-03-21 NOTE — Assessment & Plan Note (Signed)
Elevated blood pressure reading in the office but normal at home. Cardiovascular risks associated with hypertension discussed Dietary approaches to stop hypertension discussed Recommend to increase lisinopril to 20 mg daily

## 2022-03-21 NOTE — Patient Instructions (Signed)
Dolor de espalda agudo en los adultos Acute Back Pain, Adult El dolor de espalda agudo es repentino y por lo general no dura mucho tiempo. Se debe generalmente a una lesin de los msculos y tejidos de la espalda. La lesin puede ser el resultado de: Estiramiento en exceso o desgarro de un msculo, tendn o ligamento. Los ligamentos son tejidos que conectan los huesos. Levantar algo de forma incorrecta puede producir un esguince de espalda. Desgaste (degeneracin) de los discos vertebrales. Los discos vertebrales son tejidos circulares que proporcionan amortiguacin entre los huesos de la columna vertebral (vrtebras). Movimientos de giro, como al practicar deportes o realizar trabajos de jardinera. Un golpe en la espalda. Artritis. Es posible que le realicen un examen fsico, anlisis de laboratorio u otros estudios de diagnstico por imgenes para encontrar la causa del dolor. El dolor de espalda agudo generalmente desaparece con reposo y cuidados en la casa. Siga estas instrucciones en su casa: Control del dolor, la rigidez y la hinchazn Use los medicamentos de venta libre y los recetados solamente como se lo haya indicado el mdico. El tratamiento puede incluir medicamentos para el dolor y la inflamacin que se toman por la boca o que se aplican sobre la piel, o relajantes musculares. El mdico puede recomendarle que se aplique hielo durante las primeras 24 a 48 horas despus del comienzo del dolor. Para hacer esto: Ponga el hielo en una bolsa plstica. Coloque una toalla entre la piel y la bolsa. Aplique el hielo durante 20 minutos, 2 o 3 veces por da. Retire el hielo si la piel se pone de color rojo brillante. Esto es muy importante. Si no puede sentir dolor, calor o fro, tiene un mayor riesgo de que se dae la zona. Si se lo indican, aplique calor en la zona afectada con la frecuencia que le haya indicado el mdico. Use la fuente de calor que el mdico le recomiende, como una compresa de  calor hmedo o una almohadilla trmica. Coloque una toalla entre la piel y la fuente de calor. Aplique calor durante 20 a 30 minutos. Retire la fuente de calor si la piel se pone de color rojo brillante. Esto es especialmente importante si no puede sentir dolor, calor o fro. Corre un mayor riesgo de sufrir quemaduras. Actividad  No permanezca en la cama. Hacer reposo en la cama por ms de 1 a 2 das puede demorar su recuperacin. Mantenga una buena postura al sentarse y pararse. No se incline hacia adelante al sentarse ni se encorve al pararse. Si trabaja en un escritorio, sintese cerca de este para no tener que inclinarse. Mantenga el mentn hacia abajo. Mantenga el cuello hacia atrs y los codos flexionados en un ngulo de 90 grados (ngulo recto). Cuando conduzca, sintese elevado y cerca del volante. Agregue un apoyo para la espalda (lumbar) al asiento del automvil, si es necesario. Realice caminatas cortas en superficies planas tan pronto como le sea posible. Trate de caminar un poco ms de tiempo cada da. No se siente, conduzca o permanezca de pie en un mismo lugar durante ms de 30 minutos seguidos. Pararse o sentarse durante largos perodos de tiempo puede sobrecargar la espalda. No conduzca ni use maquinaria pesada mientras toma analgsicos recetados. Use tcnicas apropiadas para levantar objetos. Cuando se inclina y levanta un objeto, utilice posiciones que no sobrecarguen tanto la espalda: Flexione las rodillas. Mantenga la carga cerca del cuerpo. No se tuerza. Haga actividad fsica habitualmente como se lo haya indicado el mdico. Hacer ejercicios ayuda   a que la espalda sane ms rpido y ayuda a evitar las lesiones de la espalda al mantener los msculos fuertes y flexibles. Trabaje con un fisioterapeuta para crear un programa de ejercicios seguros, segn lo recomiende el mdico. Haga ejercicios como se lo haya indicado el fisioterapeuta. Estilo de vida Mantenga un peso saludable.  El sobrepeso sobrecarga la espalda y hace que resulte difcil tener una buena postura. Evite actividades o situaciones que lo hagan sentirse ansioso o estresado. El estrs y la ansiedad aumentan la tensin muscular y pueden empeorar el dolor de espalda. Aprenda formas de manejar la ansiedad y el estrs, como a travs del ejercicio. Instrucciones generales [place] sobre un colchn firme en una posicin cmoda. Intente acostarse de costado, con las rodillas ligeramente flexionadas. Si se recuesta sobre la espalda, coloque una almohada debajo de las rodillas. Mantenga la cabeza y el cuello en lnea recta con la columna vertebral (posicin neutra) cuando use equipos electrnicos como telfonos inteligentes o tablets. Para hacer esto: Levante el telfono inteligente o la tablet para mirarlo en lugar de inclinar la cabeza o el cuello para mirar hacia abajo. Coloque el telfono inteligente o la tablet al nivel de su cara mientras mira la pantalla. Siga el plan de tratamiento como se lo haya indicado el mdico. Esto puede incluir: Terapia cognitiva o conductual. Acupuntura o terapia de masajes. Yoga o meditacin. Comunquese con un mdico si: Siente un dolor que no se alivia con reposo o medicamentos. Siente mucho dolor que se extiende a las piernas o las nalgas. El dolor no mejora luego de 2 semanas. Siente dolor por la noche. Pierde peso sin proponrselo. Tiene fiebre o escalofros. Siente nuseas o vmitos. Siente dolor abdominal. Solicite ayuda de inmediato si: Tiene nuevos problemas para controlar la vejiga o los intestinos. Siente debilidad o adormecimiento inusuales en los brazos o en las piernas. Siente que va a desmayarse. Estos sntomas pueden representar un problema grave que constituye una emergencia. No espere a ver si los sntomas desaparecen. Solicite atencin mdica de inmediato. Comunquese con el servicio de emergencias de su localidad (911 en los [place]). No conduzca por sus  propios medios hasta el hospital. Resumen El dolor de espalda agudo es repentino y por lo general no dura mucho tiempo. Use tcnicas apropiadas para levantar objetos. Cuando se inclina y levanta un objeto, utilice posiciones que no sobrecarguen tanto la espalda. Tome los medicamentos de venta libre y los recetados solamente como se lo haya indicado el mdico, y aplquese calor o hielo segn las indicaciones. Esta informacin no tiene como fin reemplazar el consejo del mdico. Asegrese de hacerle al mdico cualquier pregunta que tenga. Document Revised: 04/06/2020 Document Reviewed: 04/06/2020 Elsevier Patient Education  2023 Elsevier Inc.  

## 2022-03-26 ENCOUNTER — Other Ambulatory Visit: Payer: Self-pay

## 2022-03-26 ENCOUNTER — Emergency Department (HOSPITAL_BASED_OUTPATIENT_CLINIC_OR_DEPARTMENT_OTHER)
Admission: EM | Admit: 2022-03-26 | Discharge: 2022-03-26 | Disposition: A | Discharge status: Home / Self Care | Payer: 59 | Attending: Emergency Medicine | Admitting: Emergency Medicine

## 2022-03-26 ENCOUNTER — Telehealth: Payer: Self-pay

## 2022-03-26 DIAGNOSIS — I129 Hypertensive chronic kidney disease with stage 1 through stage 4 chronic kidney disease, or unspecified chronic kidney disease: Secondary | ICD-10-CM | POA: Diagnosis not present

## 2022-03-26 DIAGNOSIS — M62838 Other muscle spasm: Secondary | ICD-10-CM | POA: Insufficient documentation

## 2022-03-26 DIAGNOSIS — N189 Chronic kidney disease, unspecified: Secondary | ICD-10-CM | POA: Insufficient documentation

## 2022-03-26 DIAGNOSIS — I2510 Atherosclerotic heart disease of native coronary artery without angina pectoris: Secondary | ICD-10-CM | POA: Insufficient documentation

## 2022-03-26 DIAGNOSIS — X500XXA Overexertion from strenuous movement or load, initial encounter: Secondary | ICD-10-CM | POA: Insufficient documentation

## 2022-03-26 DIAGNOSIS — S39012A Strain of muscle, fascia and tendon of lower back, initial encounter: Secondary | ICD-10-CM | POA: Insufficient documentation

## 2022-03-26 DIAGNOSIS — Z79899 Other long term (current) drug therapy: Secondary | ICD-10-CM | POA: Diagnosis not present

## 2022-03-26 DIAGNOSIS — S3992XA Unspecified injury of lower back, initial encounter: Secondary | ICD-10-CM | POA: Diagnosis present

## 2022-03-26 DIAGNOSIS — Z7982 Long term (current) use of aspirin: Secondary | ICD-10-CM | POA: Diagnosis not present

## 2022-03-26 DIAGNOSIS — S39012D Strain of muscle, fascia and tendon of lower back, subsequent encounter: Secondary | ICD-10-CM

## 2022-03-26 MED ORDER — NAPROXEN 375 MG PO TABS: 375.0000 mg | ORAL_TABLET | Freq: Two times a day (BID) | ORAL | 0 refills | Status: DC

## 2022-03-26 MED ORDER — NAPROXEN 375 MG PO TABS: 375.0000 mg | ORAL_TABLET | Freq: Two times a day (BID) | ORAL | 0 refills | Status: DC | PRN

## 2022-03-26 MED ORDER — ACETAMINOPHEN 500 MG PO TABS
1000.0000 mg | ORAL_TABLET | Freq: Once | ORAL | Status: AC
  Administered 2022-03-26: 1000 mg via ORAL
  Filled 2022-03-26: qty 2

## 2022-03-26 MED ORDER — KETOROLAC TROMETHAMINE 60 MG/2ML IM SOLN
30.0000 mg | Freq: Once | INTRAMUSCULAR | Status: AC
  Administered 2022-03-26: 30 mg via INTRAMUSCULAR
  Filled 2022-03-26: qty 2

## 2022-03-26 NOTE — ED Notes (Signed)
Reviewed AVS with patient, patient expressed understanding of directions, denies further questions at this time. 

## 2022-03-26 NOTE — Discharge Instructions (Addendum)
Para controlar el dolor se pueden tomar 1000 mg de Tylenol cada 8 horas programadas. Adems se pueden tomar 375 mg de Naproxeno cada 12 horas segn sea necesario para el dolor no controlado con el Tylenol programado.  Puede ponerse crema para los musculos como [place], [place], [place], Social research officer, government. Puede estirar, ponerce hielo o comprecion de calor, o que le den [place].

## 2022-03-26 NOTE — Transitions of Care (Post Inpatient/ED Visit) (Signed)
   03/26/2022  Name: Ethan Silva MRN: [id number] DOB: February 03, 1965  Today's TOC FU Call Status: Today's TOC FU Call Status:: Successful TOC FU Call Competed TOC FU Call Complete Date: 03/26/22  Transition Care Management Follow-up Telephone Call Date of Discharge: 03/26/22 Discharge Facility: [facility] ([facility]) Type of Discharge: Emergency Department Reason for ED Visit: Other: (muscle strain) How have you been since you were released from the hospital?: Better Any questions or concerns?: No  Items Reviewed: Did you receive and understand the discharge instructions provided?: Yes Medications obtained and verified?: Yes (Medications Reviewed) Any new allergies since your discharge?: No Dietary orders reviewed?: NA Do you have support at home?: Yes People in Home: child(ren), adult  Home Care and Equipment/Supplies: [place] Ordered?: NA Any new equipment or medical supplies ordered?: NA  Functional Questionnaire: Do you need assistance with bathing/showering or dressing?: No Do you need assistance with meal preparation?: No Do you need assistance with eating?: No Do you have difficulty maintaining continence: No Do you need assistance with getting out of bed/getting out of a chair/moving?: No Do you have difficulty managing or taking your medications?: No  Follow up appointments reviewed: PCP Follow-up appointment confirmed?: Yes Date of PCP follow-up appointment?: 03/27/22 Follow-up Provider: [facility] Follow-up appointment confirmed?: NA Do you need transportation to your follow-up appointment?: No Do you understand care options if your condition(s) worsen?: Yes-patient verbalized understanding    [place], [place] Direct Dial [phone]

## 2022-03-26 NOTE — ED Provider Notes (Signed)
[facility] Provider Note  CSN: [id number] Arrival date & time: 03/26/22 0149  Chief Complaint(s) Back Pain  HPI Ethan Silva is a 57 y.o. male with a past medical history listed below who presents to the emergency department for 3 weeks of lower back pain described as aching with spastic features.  Worse with twisting, bending and certain movements.  He denies any falls or trauma.  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  He was seen by his primary care provider earlier in the week and prescribed Flexeril which is not providing any relief.  He does report finding relief if he is still.  Pain does not radiate to the legs.  He denies any bladder/bowel incontinence.  States that he is able to ambulate without difficulty.  No lower extremity weakness or loss of sensation.  No trauma associated with the injury.  The history is provided by the patient.    Past Medical History Past Medical History:  Diagnosis Date   Aortic stenosis, mild 07/2012   Very mild aortic stenosis noted on echo-mean gradient 11 mmHg.   Bile duct leak    CAD in native artery 07/14/2020   Small branch of OM 3 with 80% ostial stenosis.  Too small for PCI.  Otherwise questionable 30% LM and 30% PDA.   Chronic kidney disease 2017   left kidney stones   Headache(784.0)    Hx: of when BP is elevated   Hyperlipidemia    Hypertension    Non-STEMI (non-ST elevated myocardial infarction) ([place]) 07/13/2020   Admitted with hypertension and chest pain.  Cardiac cath showed small branch of an OM3 with 85% ostial stenosis not amenable to PCI.  No other significant disease noted.  Normal EF on echo.  Medical management.   Numbness and tingling in hands    Hx: of   Prediabetes    Patient Active Problem List   Diagnosis Date Noted   Lumbosacral strain 03/21/2022   Musculoskeletal pain 03/21/2022   Chronic foot  pain, right 10/30/2021   Calcaneal spur of right foot 10/30/2021   Aortic atherosclerosis ([place]) 04/25/2021   Coronary artery disease involving native coronary artery of native heart with angina pectoris ([place]) 07/15/2020   Prediabetes 07/15/2020   BMI 30.0-30.9,adult 08/30/2015   Hyperlipidemia with target LDL less than 70 08/30/2015   Essential hypertension 10/11/2012   Home Medication(s) Prior to Admission medications   Medication Sig Start Date End Date Taking? Authorizing Provider  acetaminophen (TYLENOL) 325 MG tablet Take 325 mg by mouth every 6 (six) hours as needed for moderate pain. Patient not taking: Reported on 03/21/2022    [provider]  amLODipine (NORVASC) 5 MG tablet Take 1 tablet (5 mg total) by mouth daily. 02/01/21   Warren Lacy, PA-C  aspirin 81 MG EC tablet Take 1 tablet (81 mg total) by mouth daily. Swallow whole. 07/15/20   O'NealCassie Freer, MD  Bempedoic Acid-Ezetimibe (NEXLIZET) 180-10 MG TABS Take 1 tablet by mouth daily. 04/04/21   Warren Lacy, PA-C  cyclobenzaprine (FLEXERIL) 10 MG tablet Take 1 tablet (10 mg total) by mouth at bedtime for 10 days. 03/21/22 03/31/22  Horald Pollen, MD  HYDROcodone-acetaminophen (NORCO/VICODIN) 5-325 MG tablet Take 1 tablet by mouth every 6 (six) hours as needed for severe pain. Patient not taking: Reported on 03/21/2022 08/12/21   Truddie Hidden, MD  lisinopril (ZESTRIL) 20  MG tablet Take 1 tablet (20 mg total) by mouth daily. 03/21/22   Horald Pollen, MD  Multiple Vitamins-Minerals [facility]) TABS Take 1 tablet by mouth daily. 03/31/21   Warren Lacy, PA-C  naproxen (NAPROSYN) 375 MG tablet Take 1 tablet (375 mg total) by mouth 2 (two) times daily as needed. 03/26/22   Eragon Hammond, Grayce Sessions, MD  nitroGLYCERIN (NITROSTAT) 0.4 MG SL tablet Place 1 tablet (0.4 mg total) under the tongue every 5 (five) minutes x 3 doses as needed for chest pain. 11/13/21   Leonie Man, MD   ondansetron (ZOFRAN ODT) 4 MG disintegrating tablet Take 1 tablet (4 mg total) by mouth every 8 (eight) hours as needed for nausea or vomiting. Patient not taking: Reported on 11/13/2021 08/27/20   Sherwood Gambler, MD  pantoprazole (PROTONIX) 40 MG tablet Take 1 tablet (40 mg total) by mouth daily. 11/13/21   Leonie Man, MD  polyethylene glycol powder [facility]) 17 GM/SCOOP powder Take 17 g by mouth 2 (two) times daily as needed. 10/11/20   Biagio Borg, MD  rosuvastatin (CRESTOR) 40 MG tablet Take 1 tablet (40 mg total) by mouth daily. 10/30/21 10/25/22  Horald Pollen, MD                                                                                                                                    Allergies Patient has no known allergies.  Review of Systems Review of Systems As noted in HPI  Physical Exam Vital Signs  I have reviewed the triage vital signs BP (!) 165/107   Pulse 63   Temp 98.1 F (36.7 C)   Resp 18   Wt 75.8 kg   SpO2 93%   BMI 29.58 kg/m   Physical Exam Vitals reviewed.  Constitutional:      General: He is not in acute distress.    Appearance: He is well-developed. He is not diaphoretic.  HENT:     Head: Normocephalic and atraumatic.     Right Ear: External ear normal.     Left Ear: External ear normal.     Nose: Nose normal.     Mouth/Throat:     Mouth: Mucous membranes are moist.  Eyes:     General: No scleral icterus.    Conjunctiva/sclera: Conjunctivae normal.  Neck:     Trachea: Phonation normal.  Cardiovascular:     Rate and Rhythm: Normal rate and regular rhythm.  Pulmonary:     Effort: Pulmonary effort is normal. No respiratory distress.     Breath sounds: No stridor.  Abdominal:     General: There is no distension.  Musculoskeletal:        General: Normal range of motion.     Cervical back: Normal range of motion.     Lumbar back: Spasms and tenderness present. No bony tenderness.       Back:  Neurological:  Mental Status: He is alert and oriented to person, place, and time.     Comments: Spine Exam: Strength: 5/5 throughout LE bilaterally  Sensation: Intact to light touch in proximal and distal LE bilaterally    Psychiatric:        Behavior: Behavior normal.     ED Results and Treatments Labs (all labs ordered are listed, but only abnormal results are displayed) Labs Reviewed - No data to display                                                                                                                       EKG  EKG Interpretation  Date/Time:    Ventricular Rate:    PR Interval:    QRS Duration:   QT Interval:    QTC Calculation:   R Axis:     Text Interpretation:         Radiology No results found.  Medications Ordered in ED Medications  acetaminophen (TYLENOL) tablet 1,000 mg (1,000 mg Oral Given 03/26/22 0243)  ketorolac (TORADOL) injection 30 mg (30 mg Intramuscular Given 03/26/22 0243)                                                                                                                                     Procedures Procedures  (including critical care time)  Medical Decision Making / ED Course  Click here for ABCD2, HEART and other calculators  Medical Decision Making Amount and/or Complexity of Data Reviewed External Data Reviewed: radiology.    Details: Xray of lumbar with DDD. No fracture/dislocation or bony lesions  Risk OTC drugs. Prescription drug management.   58 y.o. male presents with back pain in lumbar area for 3 weeks without signs of radicular pain. No acute traumatic onset. No red flag symptoms of fever, weight loss, saddle anesthesia, weakness, fecal/urinary incontinence or urinary retention. Doubt acute aortic syndrome.  Suspect MSK etiology.   No indication for repeat imaging emergently.   Patient was recommended to take short course of scheduled Tylenol with PRN NSAIDs and engage in early mobility as definitive  treatment. Return precautions discussed for worsening or new concerning symptoms.       Final Clinical Impression(s) / ED Diagnoses Final diagnoses:  Lumbar strain, subsequent encounter  Muscle spasm   The patient appears reasonably screened and/or stabilized for discharge and I doubt any other medical condition or other [facility] requiring  further screening, evaluation, or treatment in the ED at this time. I have discussed the findings, Dx and Tx plan with the patient/family who expressed understanding and agree(s) with the plan. Discharge instructions discussed at length. The patient/family was given strict return precautions who verbalized understanding of the instructions. No further questions at time of discharge.  Disposition: Discharge  Condition: Good  ED Discharge Orders          Ordered         naproxen (NAPROSYN) 375 MG tablet  2 times daily PRN        03/26/22 0319             Follow Up: Horald Pollen, MD [place] [address] [phone]  Call  to schedule an appointment for close follow up, if symptoms do not improve or  worsen           This chart was dictated using voice recognition software.  Despite best efforts to proofread,  errors can occur which can change the documentation meaning.    Fatima Blank, MD 03/26/22 [phone]

## 2022-03-26 NOTE — ED Triage Notes (Signed)
Reports lower back pain over one month. Reports taking Flexeril without relief. Denies recent injury.

## 2022-03-27 ENCOUNTER — Ambulatory Visit (INDEPENDENT_AMBULATORY_CARE_PROVIDER_SITE_OTHER): Payer: 59 | Admitting: Emergency Medicine

## 2022-03-27 ENCOUNTER — Encounter: Payer: Self-pay | Admitting: Emergency Medicine

## 2022-03-27 VITALS — BP 142/102 | HR 72 | Temp 98.0°F | Ht 63.0 in | Wt 170.0 lb

## 2022-03-27 DIAGNOSIS — S39012D Strain of muscle, fascia and tendon of lower back, subsequent encounter: Secondary | ICD-10-CM

## 2022-03-27 DIAGNOSIS — M545 Low back pain, unspecified: Secondary | ICD-10-CM | POA: Insufficient documentation

## 2022-03-27 MED ORDER — CYCLOBENZAPRINE HCL 10 MG PO TABS: 10.0000 mg | ORAL_TABLET | Freq: Every day | ORAL | 1 refills | Status: DC

## 2022-03-27 NOTE — Assessment & Plan Note (Signed)
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  Patient is established with local orthopedist Will benefit from physical therapy

## 2022-03-27 NOTE — Patient Instructions (Signed)
Dolor de espalda agudo en los adultos Acute Back Pain, Adult El dolor de espalda agudo es repentino y por lo general no dura mucho tiempo. Se debe generalmente a una lesin de los msculos y tejidos de la espalda. La lesin puede ser el resultado de: Estiramiento en exceso o desgarro de un msculo, tendn o ligamento. Los ligamentos son tejidos que conectan los huesos. Levantar algo de forma incorrecta puede producir un esguince de espalda. Desgaste (degeneracin) de los discos vertebrales. Los discos vertebrales son tejidos circulares que proporcionan amortiguacin entre los huesos de la columna vertebral (vrtebras). Movimientos de giro, como al practicar deportes o realizar trabajos de jardinera. Un golpe en la espalda. Artritis. Es posible que le realicen un examen fsico, anlisis de laboratorio u otros estudios de diagnstico por imgenes para encontrar la causa del dolor. El dolor de espalda agudo generalmente desaparece con reposo y cuidados en la casa. Siga estas instrucciones en su casa: Control del dolor, la rigidez y la hinchazn Use los medicamentos de venta libre y los recetados solamente como se lo haya indicado el mdico. El tratamiento puede incluir medicamentos para el dolor y la inflamacin que se toman por la boca o que se aplican sobre la piel, o relajantes musculares. El mdico puede recomendarle que se aplique hielo durante las primeras 24 a 48 horas despus del comienzo del dolor. Para hacer esto: Ponga el hielo en una bolsa plstica. Coloque una toalla entre la piel y la bolsa. Aplique el hielo durante 20 minutos, 2 o 3 veces por da. Retire el hielo si la piel se pone de color rojo brillante. Esto es muy importante. Si no puede sentir dolor, calor o fro, tiene un mayor riesgo de que se dae la zona. Si se lo indican, aplique calor en la zona afectada con la frecuencia que le haya indicado el mdico. Use la fuente de calor que el mdico le recomiende, como una compresa de  calor hmedo o una almohadilla trmica. Coloque una toalla entre la piel y la fuente de calor. Aplique calor durante 20 a 30 minutos. Retire la fuente de calor si la piel se pone de color rojo brillante. Esto es especialmente importante si no puede sentir dolor, calor o fro. Corre un mayor riesgo de sufrir quemaduras. Actividad  No permanezca en la cama. Hacer reposo en la cama por ms de 1 a 2 das puede demorar su recuperacin. Mantenga una buena postura al sentarse y pararse. No se incline hacia adelante al sentarse ni se encorve al pararse. Si trabaja en un escritorio, sintese cerca de este para no tener que inclinarse. Mantenga el mentn hacia abajo. Mantenga el cuello hacia atrs y los codos flexionados en un ngulo de 90 grados (ngulo recto). Cuando conduzca, sintese elevado y cerca del volante. Agregue un apoyo para la espalda (lumbar) al asiento del automvil, si es necesario. Realice caminatas cortas en superficies planas tan pronto como le sea posible. Trate de caminar un poco ms de tiempo cada da. No se siente, conduzca o permanezca de pie en un mismo lugar durante ms de 30 minutos seguidos. Pararse o sentarse durante largos perodos de tiempo puede sobrecargar la espalda. No conduzca ni use maquinaria pesada mientras toma analgsicos recetados. Use tcnicas apropiadas para levantar objetos. Cuando se inclina y levanta un objeto, utilice posiciones que no sobrecarguen tanto la espalda: Flexione las rodillas. Mantenga la carga cerca del cuerpo. No se tuerza. Haga actividad fsica habitualmente como se lo haya indicado el mdico. Hacer ejercicios ayuda   a que la espalda sane ms rpido y ayuda a evitar las lesiones de la espalda al mantener los msculos fuertes y flexibles. Trabaje con un fisioterapeuta para crear un programa de ejercicios seguros, segn lo recomiende el mdico. Haga ejercicios como se lo haya indicado el fisioterapeuta. Estilo de vida Mantenga un peso saludable.  El sobrepeso sobrecarga la espalda y hace que resulte difcil tener una buena postura. Evite actividades o situaciones que lo hagan sentirse ansioso o estresado. El estrs y la ansiedad aumentan la tensin muscular y pueden empeorar el dolor de espalda. Aprenda formas de manejar la ansiedad y el estrs, como a travs del ejercicio. Instrucciones generales [place] sobre un colchn firme en una posicin cmoda. Intente acostarse de costado, con las rodillas ligeramente flexionadas. Si se recuesta sobre la espalda, coloque una almohada debajo de las rodillas. Mantenga la cabeza y el cuello en lnea recta con la columna vertebral (posicin neutra) cuando use equipos electrnicos como telfonos inteligentes o tablets. Para hacer esto: Levante el telfono inteligente o la tablet para mirarlo en lugar de inclinar la cabeza o el cuello para mirar hacia abajo. Coloque el telfono inteligente o la tablet al nivel de su cara mientras mira la pantalla. Siga el plan de tratamiento como se lo haya indicado el mdico. Esto puede incluir: Terapia cognitiva o conductual. Acupuntura o terapia de masajes. Yoga o meditacin. Comunquese con un mdico si: Siente un dolor que no se alivia con reposo o medicamentos. Siente mucho dolor que se extiende a las piernas o las nalgas. El dolor no mejora luego de 2 semanas. Siente dolor por la noche. Pierde peso sin proponrselo. Tiene fiebre o escalofros. Siente nuseas o vmitos. Siente dolor abdominal. Solicite ayuda de inmediato si: Tiene nuevos problemas para controlar la vejiga o los intestinos. Siente debilidad o adormecimiento inusuales en los brazos o en las piernas. Siente que va a desmayarse. Estos sntomas pueden representar un problema grave que constituye una emergencia. No espere a ver si los sntomas desaparecen. Solicite atencin mdica de inmediato. Comunquese con el servicio de emergencias de su localidad (911 en los [place]). No conduzca por sus  propios medios hasta el hospital. Resumen El dolor de espalda agudo es repentino y por lo general no dura mucho tiempo. Use tcnicas apropiadas para levantar objetos. Cuando se inclina y levanta un objeto, utilice posiciones que no sobrecarguen tanto la espalda. Tome los medicamentos de venta libre y los recetados solamente como se lo haya indicado el mdico, y aplquese calor o hielo segn las indicaciones. Esta informacin no tiene como fin reemplazar el consejo del mdico. Asegrese de hacerle al mdico cualquier pregunta que tenga. Document Revised: 04/06/2020 Document Reviewed: 04/06/2020 Elsevier Patient Education  2023 Elsevier Inc.  

## 2022-03-27 NOTE — Assessment & Plan Note (Signed)
Musculoskeletal and mechanical in nature. Improved today.  No red flag signs or symptoms No neurological signs or symptoms Pain management discussed Continue naproxen and Flexeril as needed

## 2022-03-27 NOTE — Progress Notes (Signed)
[address] [place] 57 y.o.   Chief Complaint  Patient presents with   Follow-up    Patient was seen in the ED for lumbar pain, patient states the pain is better today, still in pain but not bad     HISTORY OF PRESENT ILLNESS: This is a 57 y.o. male here for follow-up of emergency department visit yesterday when he presented with lumbar pain Was seen by me last week on 03/21/2022 with similar complaint.  Was started on Flexeril.  X-ray showed multilevel degenerative changes of lumbar spine. Denies bowel or bladder symptoms.  Feeling better today. Taking Flexeril and naproxen No other complaints or medical concerns today.  HPI   Prior to Admission medications   Medication Sig Start Date End Date Taking? Authorizing Provider  amLODipine (NORVASC) 5 MG tablet Take 1 tablet (5 mg total) by mouth daily. 02/01/21  Yes Warren Lacy, PA-C  aspirin 81 MG EC tablet Take 1 tablet (81 mg total) by mouth daily. Swallow whole. 07/15/20  Yes O'Neal, Cassie Freer, MD  Bempedoic Acid-Ezetimibe (NEXLIZET) 180-10 MG TABS Take 1 tablet by mouth daily. 04/04/21  Yes Warren Lacy, PA-C  cyclobenzaprine (FLEXERIL) 10 MG tablet Take 1 tablet (10 mg total) by mouth at bedtime for 10 days. 03/21/22 03/31/22 Yes Talullah Abate, Ines Bloomer, MD  lisinopril (ZESTRIL) 20 MG tablet Take 1 tablet (20 mg total) by mouth daily. 03/21/22  Yes Maisy Newport, Ines Bloomer, MD  Multiple Vitamins-Minerals [facility]) TABS Take 1 tablet by mouth daily. 03/31/21  Yes Warren Lacy, PA-C  naproxen (NAPROSYN) 375 MG tablet Take 1 tablet (375 mg total) by mouth 2 (two) times daily as needed. 03/26/22  Yes Cardama, Grayce Sessions, MD  nitroGLYCERIN (NITROSTAT) 0.4 MG SL tablet Place 1 tablet (0.4 mg total) under the tongue every 5 (five) minutes x 3 doses as needed for chest pain. 11/13/21  Yes Leonie Man, MD  pantoprazole (PROTONIX) 40 MG tablet Take 1 tablet (40 mg total) by mouth daily. 11/13/21  Yes Leonie Man, MD  polyethylene glycol powder [facility]) 17 GM/SCOOP powder Take 17 g by mouth 2 (two) times daily as needed. 10/11/20  Yes Biagio Borg, MD  rosuvastatin (CRESTOR) 40 MG tablet Take 1 tablet (40 mg total) by mouth daily. 10/30/21 10/25/22 Yes Suede Greenawalt, Ines Bloomer, MD  acetaminophen (TYLENOL) 325 MG tablet Take 325 mg by mouth every 6 (six) hours as needed for moderate pain. Patient not taking: Reported on 03/21/2022    [provider]    No Known Allergies  Patient Active Problem List   Diagnosis Date Noted   Lumbosacral strain 03/21/2022   Musculoskeletal pain 03/21/2022   Chronic foot pain, right 10/30/2021   Calcaneal spur of right foot 10/30/2021   Aortic atherosclerosis ([place]) 04/25/2021   Coronary artery disease involving native coronary artery of native heart with angina pectoris ([place]) 07/15/2020   Prediabetes 07/15/2020   BMI 30.0-30.9,adult 08/30/2015   Hyperlipidemia with target LDL less than 70 08/30/2015   Essential hypertension 10/11/2012    Past Medical History:  Diagnosis Date   Aortic stenosis, mild 07/2012   Very mild aortic stenosis noted on echo-mean gradient 11 mmHg.   Bile duct leak    CAD in native artery 07/14/2020   Small branch of OM 3 with 80% ostial stenosis.  Too small for PCI.  Otherwise questionable 30% LM and 30% PDA.   Chronic kidney disease 2017   left kidney stones   Headache(784.0)  Hx: of when BP is elevated   Hyperlipidemia    Hypertension    Non-STEMI (non-ST elevated myocardial infarction) ([place]) 07/13/2020   Admitted with hypertension and chest pain.  Cardiac cath showed small branch of an OM3 with 85% ostial stenosis not amenable to PCI.  No other significant disease noted.  Normal EF on echo.  Medical management.   Numbness and tingling in hands    Hx: of   Prediabetes     Past Surgical History:  Procedure Laterality Date   BILIARY DILATION  03/16/2021   Procedure: BILIARY DILATION;  Surgeon: Milus Banister, MD;  Location: [facility];  Service: Endoscopy;;   CHOLECYSTECTOMY N/A 07/11/2012   Procedure: LAPAROSCOPIC CHOLECYSTECTOMY WITH INTRAOPERATIVE CHOLANGIOGRAM;  Surgeon: Ralene Ok, MD;  Location: [facility];  Service: General;  Laterality: N/A;   COLONOSCOPY     ENDOSCOPIC RETROGRADE CHOLANGIOPANCREATOGRAPHY (ERCP) WITH PROPOFOL N/A 03/16/2021   Procedure: ENDOSCOPIC RETROGRADE CHOLANGIOPANCREATOGRAPHY (ERCP) WITH PROPOFOL;  Surgeon: Milus Banister, MD;  Location: [facility];  Service: Endoscopy;  Laterality: N/A;   ERCP  07/16/2012   ERCP N/A 07/16/2012   Procedure: ENDOSCOPIC RETROGRADE CHOLANGIOPANCREATOGRAPHY (ERCP);  Surgeon: Inda Castle, MD;  Location: [place];  Service: Gastroenterology;  Laterality: N/A;   ERCP N/A 10/06/2012   Procedure: ENDOSCOPIC RETROGRADE CHOLANGIOPANCREATOGRAPHY (ERCP);  Surgeon: Inda Castle, MD;  Location: [facility];  Service: Endoscopy;  Laterality: N/A;   ESOPHAGOGASTRODUODENOSCOPY N/A 03/16/2021   Procedure: ESOPHAGOGASTRODUODENOSCOPY (EGD);  Surgeon: Milus Banister, MD;  Location: [facility];  Service: Endoscopy;  Laterality: N/A;   EUS N/A 03/16/2021   Procedure: UPPER ENDOSCOPIC ULTRASOUND (EUS) RADIAL;  Surgeon: Milus Banister, MD;  Location: [facility];  Service: Endoscopy;  Laterality: N/A;   HAND SURGERY     LEFT HEART CATH AND CORONARY ANGIOGRAPHY N/A 07/14/2020   Procedure: LEFT HEART CATH AND CORONARY ANGIOGRAPHY;  Surgeon: Belva Crome, MD;  Location: [facility];  Service: Cardiovascular; ? Culprit Lesion ~ 80% ostial small OM3 (too small & ostial for PCI). ~30% mid LM (at a bend).  LAD with D1 & D2 - normal, RCA minimal luminal irregularities ~ 30%. PDA   SPHINCTEROTOMY  03/16/2021   Procedure: SPHINCTEROTOMY;  Surgeon: Milus Banister, MD;  Location: [facility];  Service: Endoscopy;;   TRANSTHORACIC ECHOCARDIOGRAM  07/14/2020   (NSTEMI/Accelerated Hypertension): EF 55 to 60%.  No RWM A.  Very mild Aortic  Stenosis-mean gradient 11 to mmHg.    Social History   Socioeconomic History   Marital status: Married    Spouse name: Alyson Locket   Number of children: 3   Years of education: 6th grade   Highest education level: Not on file  Occupational History   Occupation: Painting    Employer: [employer], [place], [place]    Comment: Mostly indoor painting  Tobacco Use   Smoking status: Never    Passive exposure: Never   Smokeless tobacco: Never  Vaping Use   Vaping Use: Never used  Substance and Sexual Activity   Alcohol use: No   Drug use: No   Sexual activity: Yes    Partners: Female  Other Topics Concern   Not on file  Social History Narrative   Originally from [place], [place]. [place] to the [place] in [place].   Lives with his wife and their youngest son   His daughter lives in [place], [place] with her husband.  Second son is now moved out of the house as well.   He  gets a decent amount of exercise walking around at work, but does not do routine exercise.  Does not drink or smoke   Social Determinants of Health   Financial Resource Strain: Not on file  Food Insecurity: Not on file  Transportation Needs: Not on file  Physical Activity: Not on file  Stress: Not on file  Social Connections: Not on file  Intimate Partner Violence: Not on file    Family History  Problem Relation Age of Onset   Gallstones Mother    Hypertension Mother    Gallstones Brother    Diabetes Paternal Uncle      Review of Systems  Constitutional: Negative.  Negative for chills and fever.  HENT: Negative.  Negative for congestion and sore throat.   Respiratory: Negative.  Negative for cough and shortness of breath.   Cardiovascular:  Negative for chest pain and palpitations.  Gastrointestinal:  Negative for abdominal pain, diarrhea, nausea and vomiting.  Genitourinary:  Negative for dysuria and hematuria.  Musculoskeletal:  Positive for back pain.  Skin: Negative.  Negative for rash.   Neurological: Negative.  Negative for dizziness, focal weakness and headaches.  All other systems reviewed and are negative.   Vitals:   03/27/22 1035  BP: (!) 146/100  Pulse: 72  Temp: 98 F (36.7 C)  SpO2: 97%    Physical Exam Vitals reviewed.  Constitutional:      Appearance: Normal appearance.  HENT:     Head: Normocephalic.  Eyes:     Extraocular Movements: Extraocular movements intact.  Cardiovascular:     Rate and Rhythm: Normal rate.  Pulmonary:     Effort: Pulmonary effort is normal.  Abdominal:     Palpations: Abdomen is soft.     Tenderness: There is no abdominal tenderness.  Skin:    General: Skin is warm and dry.  Neurological:     General: No focal deficit present.     Mental Status: He is alert and oriented to person, place, and time.     Sensory: No sensory deficit.     Motor: No weakness.     Coordination: Coordination normal.     Gait: Gait normal.     Deep Tendon Reflexes: Reflexes normal.  Psychiatric:        Mood and Affect: Mood normal.        Behavior: Behavior normal.      ASSESSMENT & PLAN: A total of 35 minutes was spent with the patient and counseling/coordination of care regarding preparing for this visit, review of most recent office visit notes, review of most recent emergency department visit notes, review of most recent x-ray report, diagnosis and management of lumbosacral pain and need for orthopedic evaluation and physical therapy, prognosis, documentation and need for follow-up.  Problem List Items Addressed This Visit       Musculoskeletal and Integument   Lumbosacral strain    Much improved today Pain management discussed X-rays showed degenerative multilevel lumbar spine changes Recommend to follow-up with orthopedist.  Patient is established with local orthopedist Will benefit from physical therapy      Relevant Medications   cyclobenzaprine (FLEXERIL) 10 MG tablet     Other   Lumbosacral pain - Primary     Musculoskeletal and mechanical in nature. Improved today.  No red flag signs or symptoms No neurological signs or symptoms Pain management discussed Continue naproxen and Flexeril as needed      Relevant Medications   cyclobenzaprine (FLEXERIL) 10 MG tablet  Patient Instructions  Dolor de espalda agudo en los adultos Acute Back Pain, Adult El dolor de espalda agudo es repentino y por lo general no dura mucho tiempo. Se debe generalmente a una lesin de los msculos y tejidos de la espalda. La lesin puede ser el resultado de: Estiramiento en exceso o desgarro de un msculo, tendn o ligamento. Los ligamentos son tejidos que [employer]. Levantar algo de forma incorrecta puede producir un esguince de espalda. Desgaste (degeneracin) de los discos vertebrales. Los discos vertebrales son tejidos circulares que proporcionan amortiguacin entre los huesos de la columna vertebral (vrtebras). Movimientos de giro, como al practicar deportes o realizar trabajos de [place]. Un golpe en la espalda. Artritis. Es posible Hydrologist un examen fsico, anlisis de laboratorio u otros estudios de diagnstico por imgenes para Animator causa del Social research officer, government. El dolor de espalda agudo generalmente desaparece con reposo y cuidados en la casa. Siga estas instrucciones en su casa: Control del dolor, la rigidez y Forensic psychologist los medicamentos de venta libre y los recetados solamente como se lo haya indicado el mdico. El tratamiento puede incluir medicamentos para Conservation officer, historic buildings y la inflamacin que se toman por la boca o que se aplican sobre la piel, o relajantes musculares. El mdico puede recomendarle que se aplique hielo durante las primeras 24 a 68 horas despus del comienzo del Social research officer, government. Para hacer esto: Ponga el hielo en una bolsa plstica. Coloque una toalla entre la piel y Therapist, nutritional. Aplique el hielo durante 20 minutos, 2 o 3 veces por da. Retire el hielo si la piel se pone de color rojo  brillante. Esto es [employer]. Si no puede sentir dolor, calor o fro, tiene un mayor riesgo de que se dae la zona. Si se lo indican, aplique calor en la zona afectada con la frecuencia que le haya indicado el mdico. Use la fuente de calor que el mdico le recomiende, como una compresa de calor hmedo o una almohadilla trmica. Coloque una toalla entre la piel y la fuente de Freight forwarder. Aplique calor durante 20 a 30 minutos. Retire la fuente de calor si la piel se pone de color rojo brillante. Esto es especialmente importante si no puede sentir dolor, calor o fro. Corre un mayor riesgo de sufrir quemaduras. Actividad  No permanezca en la cama. Hacer reposo en la cama por ms de 1 a 2 das puede demorar su recuperacin. Mantenga una buena postura al sentarse y pararse. No se incline hacia adelante al sentarse ni se encorve al pararse. Si trabaja en un escritorio, sintese cerca de este para no tener que inclinarse. Mantenga el mentn hacia abajo. Mantenga el cuello hacia atrs y los codos flexionados en un ngulo de 90 grados (ngulo recto). Cuando conduzca, sintese elevado y cerca del volante. Agregue un apoyo para la espalda (lumbar) al asiento del automvil, si es necesario. Realice caminatas cortas en superficies planas tan pronto como le sea posible. Trate de caminar un poco ms de Publishing copy. No se siente, conduzca o permanezca de pie en un mismo lugar durante ms de 30 minutos seguidos. Pararse o sentarse durante largos perodos de Radiographer, therapeutic la espalda. No conduzca ni use maquinaria pesada mientras toma analgsicos recetados. Use tcnicas apropiadas para levantar objetos. Cuando se inclina y Chief Executive Officer un [place], utilice posiciones que no sobrecarguen tanto la espalda: [place]. Mantenga la carga cerca del cuerpo. No se tuerza. Haga actividad fsica habitualmente como se lo haya indicado el mdico.  Hacer ejercicios ayuda a que la espalda sane ms rpido y  [place] a Product/process development scientist las lesiones de la espalda al [employer] msculos fuertes y flexibles. Trabaje con un fisioterapeuta para crear un programa de ejercicios seguros, segn lo recomiende el mdico. Haga ejercicios como se lo haya indicado el fisioterapeuta. Estilo de vida Mantenga un peso saludable. El sobrepeso sobrecarga la espalda y hace que resulte difcil tener una buena [place]. Evite actividades o situaciones que lo hagan sentirse ansioso o estresado. El estrs y la ansiedad aumentan la tensin muscular y pueden empeorar el dolor de espalda. Aprenda formas de [employer] ansiedad y [place], como a travs del ejercicio. Instrucciones generales [place] sobre un colchn firme en una posicin cmoda. Intente acostarse de costado, con las rodillas ligeramente flexionadas. Si se recuesta [employer], coloque una almohada debajo de las rodillas. Mantenga la cabeza y el cuello en lnea recta con la columna vertebral (posicin neutra) cuando use equipos electrnicos como telfonos inteligentes o tablets. Para hacer esto: Levante el telfono inteligente o la tablet para Consulting civil engineer de inclinar la cabeza o el cuello para mirar hacia abajo. Coloque el telfono inteligente o la tablet al nivel de su cara mientras mira la pantalla. Siga el plan de tratamiento como se lo haya indicado el mdico. Esto puede incluir: Terapia cognitiva o conductual. Acupuntura o terapia de masajes. Yoga o meditacin. Comunquese con un mdico si: Siente un dolor que no se alivia con reposo o medicamentos. Siente mucho dolor que se extiende a las piernas o las nalgas. El dolor no mejora luego de 2 semanas. Siente dolor por la noche. Pierde peso sin proponrselo. Tiene fiebre o escalofros. Siente nuseas o vmitos. Siente dolor abdominal. Solicite ayuda de inmediato si: Tiene nuevos problemas para controlar la vejiga o los intestinos. Siente debilidad o adormecimiento inusuales en los brazos o en las  piernas. Siente que va a desmayarse. Estos sntomas pueden representar un problema grave que constituye Engineer, maintenance (IT). No espere a ver si los sntomas desaparecen. Solicite atencin mdica de inmediato. Comunquese con el servicio de emergencias de su localidad (911 en los [place]). No conduzca por sus propios medios Principal [employer]. Resumen El dolor de espalda agudo es repentino y por lo general no dura mucho tiempo. Use tcnicas apropiadas para levantar objetos. Cuando se inclina y levanta un [place], utilice posiciones que no sobrecarguen tanto la espalda. Tome los medicamentos de venta libre y los recetados solamente como se lo haya indicado el mdico, y aplquese calor o hielo segn las indicaciones. Esta informacin no tiene Marine scientist el consejo del mdico. Asegrese de hacerle al mdico cualquier pregunta que tenga. Document Revised: 04/06/2020 Document Reviewed: 04/06/2020 Elsevier Patient Education  Belva, MD [facility]

## 2022-03-28 ENCOUNTER — Ambulatory Visit (INDEPENDENT_AMBULATORY_CARE_PROVIDER_SITE_OTHER): Payer: Medicaid Other | Admitting: Podiatry

## 2022-03-28 DIAGNOSIS — M7751 Other enthesopathy of right foot: Secondary | ICD-10-CM

## 2022-03-28 NOTE — Progress Notes (Signed)
Subjective:  Patient ID: Ethan Silva, male    DOB: [birth year],  MRN: [id number]  Chief Complaint  Patient presents with   Foot Pain    57 y.o. male presents with the above complaint.  Patient presents with right second metatarsophalangeal joint capsulitis.  Patient states pain for touch is progressive gotten worse hurts with ambulation worse with pressure he would like for me to discuss treatment options.  He states he works a lot on his feet has been off for last 2 weeks and has got a little bit better.  He would like to discuss treatment options for it.  He had x-rays done at his primary care physician office   Review of Systems: Negative except as noted in the HPI. Denies N/V/F/Ch.  Past Medical History:  Diagnosis Date   Aortic stenosis, mild 07/2012   Very mild aortic stenosis noted on echo-mean gradient 11 mmHg.   Bile duct leak    CAD in native artery 07/14/2020   Small branch of OM 3 with 80% ostial stenosis.  Too small for PCI.  Otherwise questionable 30% LM and 30% PDA.   Chronic kidney disease 2017   left kidney stones   Headache(784.0)    Hx: of when BP is elevated   Hyperlipidemia    Hypertension    Non-STEMI (non-ST elevated myocardial infarction) ([place]) 07/13/2020   Admitted with hypertension and chest pain.  Cardiac cath showed small branch of an OM3 with 85% ostial stenosis not amenable to PCI.  No other significant disease noted.  Normal EF on echo.  Medical management.   Numbness and tingling in hands    Hx: of   Prediabetes     Current Outpatient Medications:    acetaminophen (TYLENOL) 325 MG tablet, Take 325 mg by mouth every 6 (six) hours as needed for moderate pain. (Patient not taking: Reported on 03/21/2022), Disp: , Rfl:    amLODipine (NORVASC) 5 MG tablet, Take 1 tablet (5 mg total) by mouth daily., Disp: 30 tablet, Rfl: 11   aspirin 81 MG EC tablet, Take 1 tablet (81 mg total) by mouth daily. Swallow whole., Disp: 90 tablet, Rfl: 3    Bempedoic Acid-Ezetimibe (NEXLIZET) 180-10 MG TABS, Take 1 tablet by mouth daily., Disp: 90 tablet, Rfl: 3   cyclobenzaprine (FLEXERIL) 10 MG tablet, Take 1 tablet (10 mg total) by mouth at bedtime for 20 days., Disp: 10 tablet, Rfl: 1   lisinopril (ZESTRIL) 20 MG tablet, Take 1 tablet (20 mg total) by mouth daily., Disp: 90 tablet, Rfl: 3   Multiple Vitamins-Minerals (MULTI FOR HIM) TABS, Take 1 tablet by mouth daily., Disp: 100 tablet, Rfl: 3   naproxen (NAPROSYN) 375 MG tablet, Take 1 tablet (375 mg total) by mouth 2 (two) times daily as needed., Disp: 20 tablet, Rfl: 0   nitroGLYCERIN (NITROSTAT) 0.4 MG SL tablet, Place 1 tablet (0.4 mg total) under the tongue every 5 (five) minutes x 3 doses as needed for chest pain., Disp: 25 tablet, Rfl: 6   pantoprazole (PROTONIX) 40 MG tablet, Take 1 tablet (40 mg total) by mouth daily., Disp: 90 tablet, Rfl: 1   polyethylene glycol powder (GLYCOLAX/MIRALAX) 17 GM/SCOOP powder, Take 17 g by mouth 2 (two) times daily as needed., Disp: 3350 g, Rfl: 1   rosuvastatin (CRESTOR) 40 MG tablet, Take 1 tablet (40 mg total) by mouth daily., Disp: 90 tablet, Rfl: 3  Social History   Tobacco Use  Smoking Status Never   Passive exposure: Never  Smokeless  Tobacco Never    No Known Allergies Objective:  There were no vitals filed for this visit. There is no height or weight on file to calculate BMI. Constitutional Well developed. Well nourished.  Vascular Dorsalis pedis pulses palpable bilaterally. Posterior tibial pulses palpable bilaterally. Capillary refill normal to all digits.  No cyanosis or clubbing noted. Pedal hair growth normal.  Neurologic Normal speech. Oriented to person, place, and time. Epicritic sensation to light touch grossly present bilaterally.  Dermatologic Nails well groomed and normal in appearance. No open wounds. No skin lesions.  Orthopedic: Pain on palpation of right second metatarsophalangeal joint.  Pain with range of motion  of the joint.  No negative negative Mulder's click noted.  No other abnormalities identified.   Radiographs: 3 views of skeletally mature the right foot: No fractures were noted.  Plantar heel spurring noted.  Midfoot arthritis noted no other abnormalities noted.  Mild hallux limitus noted Assessment:   1. Capsulitis of metatarsophalangeal (MTP) joint of right foot    Plan:  Patient was evaluated and treated and all questions answered.  Right second metatarsophalangeal joint capsulitis -All questions and concerns were discussed with the patient extensive detail given the amount of pain that is having he will benefit from steroid injection help decrease inflammatory component associate with pain.  Patient agrees with plan like to proceed with steroid injection -A steroid injection was performed at right second MTP using 1% plain Lidocaine and 10 mg of Kenalog. This was well tolerated. -I discussed shoe gear modification in extensive detail   No follow-ups on file.

## 2022-04-02 DIAGNOSIS — Z419 Encounter for procedure for purposes other than remedying health state, unspecified: Secondary | ICD-10-CM | POA: Diagnosis not present

## 2022-04-16 ENCOUNTER — Encounter: Payer: Self-pay | Admitting: Emergency Medicine

## 2022-04-16 ENCOUNTER — Ambulatory Visit (INDEPENDENT_AMBULATORY_CARE_PROVIDER_SITE_OTHER): Payer: 59 | Admitting: Emergency Medicine

## 2022-04-16 VITALS — BP 130/70 | HR 73 | Temp 98.5°F | Ht 63.0 in | Wt 166.4 lb

## 2022-04-16 DIAGNOSIS — M5136 Other intervertebral disc degeneration, lumbar region: Secondary | ICD-10-CM

## 2022-04-16 DIAGNOSIS — M5440 Lumbago with sciatica, unspecified side: Secondary | ICD-10-CM | POA: Insufficient documentation

## 2022-04-16 DIAGNOSIS — M545 Low back pain, unspecified: Secondary | ICD-10-CM

## 2022-04-16 DIAGNOSIS — I10 Essential (primary) hypertension: Secondary | ICD-10-CM

## 2022-04-16 DIAGNOSIS — S39012D Strain of muscle, fascia and tendon of lower back, subsequent encounter: Secondary | ICD-10-CM | POA: Diagnosis not present

## 2022-04-16 MED ORDER — TRAMADOL HCL 50 MG PO TABS
50.0000 mg | ORAL_TABLET | Freq: Three times a day (TID) | ORAL | 0 refills | Status: AC | PRN
Start: 2022-04-16 — End: 2022-04-21

## 2022-04-16 MED ORDER — CYCLOBENZAPRINE HCL 10 MG PO TABS: 10.0000 mg | ORAL_TABLET | Freq: Every day | ORAL | 1 refills | Status: AC

## 2022-04-16 MED ORDER — MELOXICAM 15 MG PO TABS
15.0000 mg | ORAL_TABLET | Freq: Every day | ORAL | 0 refills | Status: AC
Start: 2022-04-16 — End: 2022-04-30

## 2022-04-16 NOTE — Patient Instructions (Signed)
Citica Sciatica  La citica es el dolor, debilidad, hormigueo o prdida de la sensibilidad (adormecimiento) a lo largo del nervio citico. El nervio citico comienza en la parte inferior de la espalda y desciende por la parte posterior de cada pierna. La citica suele afectar un lado del cuerpo. Suele desaparecer por s sola o con tratamiento. A veces, la citica puede volver a manifestarse. Cules son las causas? Esta afeccin se produce cuando el nervio citico se comprime o se ejerce presin sobre l. Las causas de esto pueden ser las siguientes: Un disco que sobresale demasiado entre los huesos de la columna vertebral (hernia de disco). Cambios en los discos vertebrales debido al envejecimiento. Una afeccin en un msculo de las nalgas. Un crecimiento seo adicional cerca del nervio citico. Una rotura (fractura) de la zona que est entre los huesos de la cadera (pelvis). Embarazo. Tumor. Esto es poco frecuente. Qu incrementa el riesgo? Es ms probable que tengan esta afeccin las personas que: Practican deportes que ponen presin o tensin sobre la columna vertebral. Tienen poca fuerza y facilidad de movimiento (flexibilidad). Han tenido una lesin en la espalda o una ciruga de espalda. Permanecen sentadas durante largos perodos. Realizan actividades que implican agacharse o levantar objetos una y otra vez. Tienen mucho sobrepeso (son obesas). Cules son los signos o sntomas? Los sntomas pueden variar de leves a muy graves. Pueden incluir los siguientes: Cualquiera de los siguientes problemas en la parte inferior de la espalda, piernas, cadera o nalgas: Hormigueo leve, prdida de la sensibilidad o dolor sordo. Sensacin de ardor. Dolor agudo. Prdida de la sensibilidad en la parte posterior de la pantorrilla o la planta del pie. Debilidad en las piernas. Dolor muy intenso en la espalda que dificulta el movimiento. Estos sntomas pueden empeorar al toser, estornudar o  rer. Tambin pueden empeorar al sentarse o estar de pie durante largos perodos. Cmo se trata? A menudo, esta afeccin mejora sin tratamiento. Sin embargo, el tratamiento puede incluir: Cambiar de actividad fsica o reducirla cuando siente dolor. Hacer ejercicio, incluido el fortalecimiento y la elongacin. Aplicar hielo o calor sobre la zona afectada. Administrar inyecciones de medicamentos para reducir el dolor y la hinchazn, o para relajar los msculos. Ciruga. Siga estas instrucciones en su casa: Medicamentos Use los medicamentos de venta libre y los recetados solamente como se lo haya indicado el mdico. Pregunte al mdico si debe evitar conducir o utilizar mquinas mientras toma los medicamentos. Control del dolor     Si se lo indican, aplique hielo en la zona afectada. Para hacer esto: Ponga el hielo en una bolsa plstica. Coloque una toalla entre la piel y la bolsa. Aplique el hielo durante 20 minutos, 2 a 3 veces por da. Si la piel se le pone de color rojo brillante, quite el hielo de inmediato para evitar daos en la piel. El riesgo de dao en la piel es mayor si no puede sentir dolor, calor o fro. Si se lo indican, aplique calor en la zona afectada. Hgalo con la frecuencia que le haya indicado el mdico. Use la fuente de calor que el mdico le indique, por ejemplo, una compresa de calor hmedo o una almohadilla trmica. Coloque una toalla entre la piel y la fuente de calor. Aplique calor durante 20 a 30 minutos. Si la piel se le pone de color rojo brillante, retire el calor de inmediato para evitar quemaduras. El riesgo de quemaduras es mayor si no puede sentir el dolor, el calor o el fro. Actividad    Retome sus actividades normales cuando el mdico le diga que es seguro. Evite las actividades que empeoran los sntomas. Descanse por breves perodos durante el da. Cuando descanse durante perodos ms largos, haga alguna actividad fsica o un estiramiento entre los  perodos de descanso. Evite estar sentado durante largos perodos sin moverse. Levntese y muvase al menos una vez cada hora. Haga suavemente los ejercicios que le indique el mdico. No levante ningn objeto que pese ms de 10 libras (4.5 kg). Aunque no tenga sntomas, evite levantar objetos pesados. Evite levantar objetos pesados de forma repetida. Al levantar objetos, hgalo siempre de una forma que sea segura para su cuerpo. Para esto, debe hacer lo siguiente: Flexione las rodillas. Mantenga el objeto cerca del cuerpo. No gire el cuerpo. Instrucciones generales Mantenga un peso saludable. Use calzado cmodo, que le d soporte al pie. Evite usar tacones. Evite dormir sobre un colchn que sea demasiado blando o demasiado duro. Es posible que sienta menos dolor si duerme en un colchn con apoyo suficientemente firme para la espalda. Comunquese con un mdico si: El dolor no se alivia con los medicamentos. El dolor no mejora. El dolor empeora. Los sntomas duran ms de 4 semanas. Pierde peso sin proponrselo. Solicite ayuda de inmediato si: No puede controlar el pis (miccin) ni la evacuacin de la materia fecal (deposiciones). Tiene debilidad en alguna de estas zonas, y la debilidad empeora: La parte inferior de la espalda. La zona que se encuentra entre los huesos de las caderas. Las nalgas. Las piernas. Siente irritacin o inflamacin en la espalda. Tiene sensacin de ardor al orinar. Resumen La citica es el dolor, debilidad, hormigueo o prdida de la sensibilidad (adormecimiento) a lo largo del nervio citico. Esto puede incluir la parte inferior de la espala, las piernas, las caderas y los glteos. Esta afeccin se produce cuando el nervio citico se comprime o se ejerce presin sobre l. El tratamiento a menudo incluye reposo, ejercicio, medicamentos y aplicar hielo o calor en la zona afectada. Esta informacin no tiene como fin reemplazar el consejo del mdico. Asegrese de  hacerle al mdico cualquier pregunta que tenga. Document Revised: 04/25/2021 Document Reviewed: 04/25/2021 [employer] Patient Education  2023 [employer]  

## 2022-04-16 NOTE — Assessment & Plan Note (Signed)
Well-controlled hypertension Continue lisinopril 20 mg daily BP Readings from Last 3 Encounters:  04/16/22 130/70  03/27/22 (!) 142/102  03/26/22 (!) 165/107

## 2022-04-16 NOTE — Assessment & Plan Note (Signed)
Pain management discussed. Recommend daily meloxicam 15 mg for 2 weeks Flexeril 10 mg at bedtime Tramadol 50 mg at bedtime as needed

## 2022-04-16 NOTE — Assessment & Plan Note (Signed)
Active and affecting quality of life Recent lumbar spine x-ray shows multilevel degenerative disc disease of lumbar spine Needs orthopedic evaluation Needs lumbar spine MRI Pain management discussed Work restrictions warranted May need short-term disability

## 2022-04-16 NOTE — Assessment & Plan Note (Signed)
As shown on recent x-ray of lumbar spine Needs MRI and orthopedic evaluation

## 2022-04-16 NOTE — Progress Notes (Signed)
[address] [place] 57 y.o.   Chief Complaint  Patient presents with   Medical Management of Chronic Issues    F/u appt back pain, radiating down leg, medication not working as well     HISTORY OF PRESENT ILLNESS: This is a 57 y.o. male complaining of lumbar pain almost daily for the past 2 months Physical work Complaining of sharp bilateral lumbar pain radiating to both buttocks and sometimes down posterior part of both legs worse with movement and better with rest.  No associated bowel or bladder symptoms No other complaints or medical concerns today Recent lumbar spine x-ray showed degenerative changes at multiple levels.  HPI   Prior to Admission medications   Medication Sig Start Date End Date Taking? Authorizing Provider  amLODipine (NORVASC) 5 MG tablet Take 1 tablet (5 mg total) by mouth daily. 02/01/21  Yes Cannon Kettle, PA-C  aspirin 81 MG EC tablet Take 1 tablet (81 mg total) by mouth daily. Swallow whole. 07/15/20  Yes O'Neal, Ronnald Ramp, MD  Bempedoic Acid-Ezetimibe (NEXLIZET) 180-10 MG TABS Take 1 tablet by mouth daily. 04/04/21  Yes Cannon Kettle, PA-C  cyclobenzaprine (FLEXERIL) 10 MG tablet Take 1 tablet (10 mg total) by mouth at bedtime for 20 days. 03/27/22 04/16/22 Yes Cathy Crounse, Eilleen Kempf, MD  lisinopril (ZESTRIL) 20 MG tablet Take 1 tablet (20 mg total) by mouth daily. 03/21/22  Yes Maks Cavallero, Eilleen Kempf, MD  Multiple Vitamins-Minerals [facility] FOR [facility]) TABS Take 1 tablet by mouth daily. 03/31/21  Yes Cannon Kettle, PA-C  naproxen (NAPROSYN) 375 MG tablet Take 1 tablet (375 mg total) by mouth 2 (two) times daily as needed. 03/26/22  Yes Cardama, Amadeo Garnet, MD  nitroGLYCERIN (NITROSTAT) 0.4 MG SL tablet Place 1 tablet (0.4 mg total) under the tongue every 5 (five) minutes x 3 doses as needed for chest pain. 11/13/21  Yes Marykay Lex, MD  pantoprazole (PROTONIX) 40 MG tablet Take 1 tablet (40 mg total) by mouth daily. 11/13/21  Yes  Marykay Lex, MD  polyethylene glycol powder [facility]) 17 GM/SCOOP powder Take 17 g by mouth 2 (two) times daily as needed. 10/11/20  Yes Corwin Levins, MD  rosuvastatin (CRESTOR) 40 MG tablet Take 1 tablet (40 mg total) by mouth daily. 10/30/21 10/25/22 Yes Gal Feldhaus, Eilleen Kempf, MD  acetaminophen (TYLENOL) 325 MG tablet Take 325 mg by mouth every 6 (six) hours as needed for moderate pain. Patient not taking: Reported on 03/21/2022    [provider]    No Known Allergies  Patient Active Problem List   Diagnosis Date Noted   Lumbosacral pain 03/27/2022   Lumbosacral strain 03/21/2022   Musculoskeletal pain 03/21/2022   Chronic foot pain, right 10/30/2021   Calcaneal spur of right foot 10/30/2021   Aortic atherosclerosis 04/25/2021   Coronary artery disease involving native coronary artery of native heart with angina pectoris 07/15/2020   Prediabetes 07/15/2020   BMI 30.0-30.9,adult 08/30/2015   Hyperlipidemia with target LDL less than 70 08/30/2015   Essential hypertension 10/11/2012    Past Medical History:  Diagnosis Date   Aortic stenosis, mild 07/2012   Very mild aortic stenosis noted on echo-mean gradient 11 mmHg.   Bile duct leak    CAD in native artery 07/14/2020   Small branch of OM 3 with 80% ostial stenosis.  Too small for PCI.  Otherwise questionable 30% LM and 30% PDA.   Chronic kidney disease 2017   left kidney stones   Headache(784.0)  Hx: of when BP is elevated   Hyperlipidemia    Hypertension    Non-STEMI (non-ST elevated myocardial infarction) (HCC) 07/13/2020   Admitted with hypertension and chest pain.  Cardiac cath showed small branch of an OM3 with 85% ostial stenosis not amenable to PCI.  No other significant disease noted.  Normal EF on echo.  Medical management.   Numbness and tingling in hands    Hx: of   Prediabetes     Past Surgical History:  Procedure Laterality Date   BILIARY DILATION  03/16/2021   Procedure: BILIARY  DILATION;  Surgeon: Rachael Fee, MD;  Location: [facility] ENDOSCOPY;  Service: Endoscopy;;   CHOLECYSTECTOMY N/A 07/11/2012   Procedure: LAPAROSCOPIC CHOLECYSTECTOMY WITH INTRAOPERATIVE CHOLANGIOGRAM;  Surgeon: Axel Filler, MD;  Location: [facility] OR;  Service: General;  Laterality: N/A;   COLONOSCOPY     ENDOSCOPIC RETROGRADE CHOLANGIOPANCREATOGRAPHY (ERCP) WITH PROPOFOL N/A 03/16/2021   Procedure: ENDOSCOPIC RETROGRADE CHOLANGIOPANCREATOGRAPHY (ERCP) WITH PROPOFOL;  Surgeon: Rachael Fee, MD;  Location: [facility] ENDOSCOPY;  Service: Endoscopy;  Laterality: N/A;   ERCP  07/16/2012   ERCP N/A 07/16/2012   Procedure: ENDOSCOPIC RETROGRADE CHOLANGIOPANCREATOGRAPHY (ERCP);  Surgeon: Louis Meckel, MD;  [facility] OR;  Service: Gastroenterology;  Laterality: N/A;   ERCP N/A 10/06/2012   Procedure: ENDOSCOPIC RETROGRADE CHOLANGIOPANCREATOGRAPHY (ERCP);  Surgeon: Louis Meckel, MD;  Location: [facility];  Service: Endoscopy;  Laterality: N/A;   ESOPHAGOGASTRODUODENOSCOPY N/A 03/16/2021   Procedure: ESOPHAGOGASTRODUODENOSCOPY (EGD);  Surgeon: Rachael Fee, MD;  Location: [facility];  Service: Endoscopy;  Laterality: N/A;   EUS N/A 03/16/2021   Procedure: UPPER ENDOSCOPIC ULTRASOUND (EUS) RADIAL;  Surgeon: Rachael Fee, MD;  Location: [facility] ENDOSCOPY;  Service: Endoscopy;  Laterality: N/A;   HAND SURGERY     LEFT HEART CATH AND CORONARY ANGIOGRAPHY N/A 07/14/2020   Procedure: LEFT HEART CATH AND CORONARY ANGIOGRAPHY;  Surgeon: Lyn Records, MD;  Location: [facility] INVASIVE CV LAB;  Service: Cardiovascular; ? Culprit Lesion ~ 80% ostial small OM3 (too small & ostial for PCI). ~30% mid LM (at a bend).  LAD with D1 & D2 - normal, RCA minimal luminal irregularities ~ 30%. PDA   SPHINCTEROTOMY  03/16/2021   Procedure: SPHINCTEROTOMY;  Surgeon: Rachael Fee, MD;  Location: [facility];  Service: Endoscopy;;   TRANSTHORACIC ECHOCARDIOGRAM  07/14/2020   (NSTEMI/Accelerated Hypertension): EF 55 to 60%.  No  RWM A.  Very mild Aortic Stenosis-mean gradient 11 to mmHg.    Social History   Socioeconomic History   Marital status: Married    Spouse name: Davonna Belling   Number of children: 3   Years of education: 6th grade   Highest education level: Not on file  Occupational History   Occupation: Painting    Employer: [employer], [place], [place]    Comment: Mostly indoor painting  Tobacco Use   Smoking status: Never    Passive exposure: Never   Smokeless tobacco: Never  Vaping Use   Vaping Use: Never used  Substance and Sexual Activity   Alcohol use: No   Drug use: No   Sexual activity: Yes    Partners: Female  Other Topics Concern   Not on file  Social History Narrative   Originally from [place], [place]. Came to the [place] in 1991.   Lives with his wife and their youngest son   His daughter lives in [place], [place] with her husband.  Second son is now moved out of the house as well.   He  gets a decent amount of exercise walking around at work, but does not do routine exercise.  Does not drink or smoke   Social Determinants of Health   Financial Resource Strain: Not on file  Food Insecurity: Not on file  Transportation Needs: Not on file  Physical Activity: Not on file  Stress: Not on file  Social Connections: Not on file  Intimate Partner Violence: Not on file    Family History  Problem Relation Age of Onset   Gallstones Mother    Hypertension Mother    Gallstones Brother    Diabetes Paternal Uncle      Review of Systems  Constitutional: Negative.  Negative for chills and fever.  HENT: Negative.  Negative for congestion and sore throat.   Respiratory: Negative.  Negative for cough and shortness of breath.   Cardiovascular: Negative.  Negative for chest pain and palpitations.  Gastrointestinal:  Negative for abdominal pain, diarrhea, nausea and vomiting.  Genitourinary: Negative.  Negative for dysuria and hematuria.  Musculoskeletal:  Positive for back  pain.  Skin: Negative.  Negative for rash.  Neurological: Negative.  Negative for dizziness and headaches.  All other systems reviewed and are negative.   Vitals:   04/16/22 1408  BP: (!) 140/88  Pulse: 73  Temp: 98.5 F (36.9 C)  SpO2: 96%    Physical Exam Vitals reviewed.  Constitutional:      Appearance: Normal appearance.  HENT:     Head: Normocephalic.     Mouth/Throat:     Mouth: Mucous membranes are moist.     Pharynx: Oropharynx is clear.  Eyes:     Extraocular Movements: Extraocular movements intact.     Conjunctiva/sclera: Conjunctivae normal.     Pupils: Pupils are equal, round, and reactive to light.  Cardiovascular:     Rate and Rhythm: Normal rate and regular rhythm.     Pulses: Normal pulses.     Heart sounds: Normal heart sounds.  Pulmonary:     Effort: Pulmonary effort is normal.     Breath sounds: Normal breath sounds.  Abdominal:     Palpations: Abdomen is soft.     Tenderness: There is no abdominal tenderness.  Musculoskeletal:     Cervical back: No tenderness.     Lumbar back: Spasms and tenderness present. No bony tenderness. Decreased range of motion. Negative right straight leg raise test and negative left straight leg raise test.  Lymphadenopathy:     Cervical: No cervical adenopathy.  Skin:    General: Skin is warm and dry.     Capillary Refill: Capillary refill takes less than 2 seconds.  Neurological:     General: No focal deficit present.     Mental Status: He is alert and oriented to person, place, and time.     Deep Tendon Reflexes: Reflexes normal.  Psychiatric:        Mood and Affect: Mood normal.        Behavior: Behavior normal.      ASSESSMENT & PLAN: A total of 42 minutes was spent with the patient and counseling/coordination of care regarding preparing for this visit, review of most recent office visit notes, review of most recent lumbar spine x-ray report, diagnosis of lumbar spine degenerative disc disease and need for  orthopedic evaluation and lumbar spine MRI, pain management, review of all medications, prognosis, documentation and need for follow-up.  Problem List Items Addressed This Visit       Cardiovascular and Mediastinum   Essential  hypertension (Chronic)    Well-controlled hypertension Continue lisinopril 20 mg daily BP Readings from Last 3 Encounters:  04/16/22 130/70  03/27/22 (!) 142/102  03/26/22 (!) 165/107          Nervous and Auditory   Back pain of lumbar region with sciatica - Primary    Active and affecting quality of life Recent lumbar spine x-ray shows multilevel degenerative disc disease of lumbar spine Needs orthopedic evaluation Needs lumbar spine MRI Pain management discussed Work restrictions warranted May need short-term disability      Relevant Medications   cyclobenzaprine (FLEXERIL) 10 MG tablet   meloxicam (MOBIC) 15 MG tablet   traMADol (ULTRAM) 50 MG tablet   Other Relevant Orders   Ambulatory referral to Orthopedic Surgery   MR Lumbar Spine Wo Contrast     Musculoskeletal and Integument   Lumbosacral strain   Relevant Medications   cyclobenzaprine (FLEXERIL) 10 MG tablet   Degenerative disc disease, lumbar    As shown on recent x-ray of lumbar spine Needs MRI and orthopedic evaluation      Relevant Medications   cyclobenzaprine (FLEXERIL) 10 MG tablet   meloxicam (MOBIC) 15 MG tablet   traMADol (ULTRAM) 50 MG tablet   Other Relevant Orders   Ambulatory referral to Orthopedic Surgery   MR Lumbar Spine Wo Contrast     Other   Lumbosacral pain    Pain management discussed. Recommend daily meloxicam 15 mg for 2 weeks Flexeril 10 mg at bedtime Tramadol 50 mg at bedtime as needed      Relevant Medications   cyclobenzaprine (FLEXERIL) 10 MG tablet   meloxicam (MOBIC) 15 MG tablet   traMADol (ULTRAM) 50 MG tablet   Patient Instructions  Citica Sciatica  La citica es el dolor, debilidad, hormigueo o prdida de la sensibilidad  (adormecimiento) a lo largo del nervio citico. El nervio citico comienza en la parte inferior de la espalda y desciende por la parte posterior de cada pierna. La citica suele afectar un lado del cuerpo. Suele desaparecer por s sola o con tratamiento. A veces, la citica puede volver a manifestarse. Cules son las causas? Esta afeccin se produce cuando el nervio citico se comprime o se ejerce presin sobre l. Las causas de esto pueden ser las siguientes: Un disco que sobresale demasiado entre los huesos de la columna vertebral (hernia de disco). Cambios en los discos vertebrales debido al envejecimiento. Una afeccin en un msculo de las nalgas. Un crecimiento seo adicional cerca del nervio citico. Una rotura (fractura) de la zona que est entre los huesos de la cadera (pelvis). Embarazo. Tumor. Esto es poco frecuente. Qu incrementa el riesgo? Es ms probable que tengan esta afeccin las personas que: Software engineer deportes que ponen presin o tensin sobre la columna vertebral. Tienen poca fuerza y facilidad de movimiento (flexibilidad). Han tenido una lesin en la espalda o una ciruga de espalda. Permanecen sentadas durante largos perodos. Realizan actividades que implican agacharse o levantar objetos una y [place]. Tienen mucho sobrepeso (son obesas). Cules son los signos o sntomas? Los sntomas pueden variar de leves a muy graves. Pueden incluir los siguientes: Cualquiera de los siguientes problemas en la parte inferior de la espalda, piernas, cadera o nalgas: Hormigueo leve, prdida de la sensibilidad o dolor sordo. Sensacin de ardor. Dolor agudo. Prdida de la sensibilidad en la parte posterior de la pantorrilla o la planta del pie. Debilidad en las piernas. Dolor muy intenso en la espalda que dificulta el movimiento. Estos sntomas pueden  empeorar al toser, estornudar o rer. Tambin pueden empeorar al sentarse o estar de pie durante largos perodos. Cmo se  trata? A menudo, esta afeccin mejora sin tratamiento. Sin embargo, el tratamiento puede incluir: Multimedia programmer de Patrick fsica o reducirla cuando siente dolor. Hacer ejercicio, incluido el fortalecimiento y Management consultant. Aplicar hielo o calor sobre la zona afectada. Administrar inyecciones de medicamentos para reducir Chief Technology Officer y la hinchazn, o para relajar los msculos. Ciruga. Siga estas instrucciones en su casa: Medicamentos Use los medicamentos de venta libre y los recetados solamente como se lo haya indicado el mdico. Pregunte al mdico si debe evitar conducir o Chemical engineer mquinas mientras toma los medicamentos. Control del dolor     Si se lo indican, aplique hielo en la zona afectada. Para hacer esto: Ponga el hielo en una bolsa plstica. Coloque una toalla entre la piel y Copy. Aplique el hielo durante 20 minutos, 2 a 3 veces por da. Si la piel se le pone de color rojo brillante, quite el hielo de inmediato para evitar daos en la piel. El riesgo de dao en la piel es mayor si no puede sentir dolor, calor o fro. Si se lo indican, aplique calor en la zona afectada. Hgalo con la frecuencia que le haya indicado el mdico. Use la fuente de calor que el mdico le indique, por ejemplo, una compresa de calor hmedo o una almohadilla trmica. Coloque una toalla entre la piel y la fuente de Airline pilot. Aplique calor durante 20 a 30 minutos. Si la piel se le pone de color rojo brillante, retire Company secretary de inmediato para evitar quemaduras. El riesgo de quemaduras es mayor si no puede sentir el dolor, el calor o el fro. Actividad  Retome sus actividades normales cuando el mdico le diga que es seguro. Evite las [employer] sntomas. Descanse por breves perodos Administrator. Cuando descanse durante perodos ms largos, haga alguna actividad fsica o un estiramiento entre los perodos de descanso. Evite estar sentado durante largos perodos sin moverse. Levntese y  [place] al menos una vez cada hora. Haga suavemente los ejercicios que le indique el mdico. No levante ningn objeto que pese ms de 10 libras (4.5 kg). Aunque no tenga sntomas, evite levantar objetos pesados. Evite levantar objetos pesados de forma repetida. Al levantar objetos, hgalo siempre de una forma que sea segura para su cuerpo. Para esto, debe hacer lo siguiente: Flexione las rodillas. Mantenga el objeto cerca del cuerpo. No gire el cuerpo. Instrucciones generales Mantenga un peso saludable. Use calzado cmodo, que le d soporte al pie. Evite usar tacones. Evite dormir sobre un colchn que sea demasiado blando o demasiado duro. Es posible que sienta menos dolor si duerme en un colchn con apoyo suficientemente firme para la espalda. Comunquese con un mdico si: El dolor no se alivia con los [employer]. El dolor no mejora. El dolor [place]. Los sntomas duran ms de 4 semanas. Pierde peso sin proponrselo. Solicite ayuda de inmediato si: No puede controlar el pis (miccin) ni la evacuacin de la materia fecal (deposiciones). Tiene debilidad en alguna de estas zonas, y la debilidad empeora: La parte inferior de la espalda. La zona que se encuentra entre los [employer] caderas. Las nalgas. Las piernas. Siente irritacin o inflamacin en la espalda. Tiene sensacin de ardor al [employer]. Resumen La citica es el dolor, debilidad, hormigueo o prdida de la sensibilidad (adormecimiento) a lo largo del nervio citico. Esto puede incluir la parte inferior de la espala,  las piernas, las caderas y los glteos. Esta afeccin se produce cuando el nervio citico se comprime o se ejerce presin sobre l. El tratamiento a menudo incluye reposo, ejercicio, medicamentos y Contractor hielo o calor en la zona afectada. Esta informacin no tiene Theme park manager el consejo del mdico. Asegrese de hacerle al mdico cualquier pregunta que tenga. Document Revised: 04/25/2021 Document  Reviewed: 04/25/2021 Elsevier Patient Education  2023 Elsevier Inc.   Edwina Barth, MD [facility]

## 2022-04-23 ENCOUNTER — Telehealth: Payer: Self-pay

## 2022-04-23 NOTE — Telephone Encounter (Signed)
Pt has called and stated his insurance will not cover the MRI of his Lumbar and is wanting to know what is it he needs to do next?

## 2022-04-23 NOTE — Telephone Encounter (Signed)
Ortho follow-up advised.  Thanks.

## 2022-04-23 NOTE — Telephone Encounter (Signed)
Called patient and advised him of provider recommendation

## 2022-04-25 ENCOUNTER — Other Ambulatory Visit (INDEPENDENT_AMBULATORY_CARE_PROVIDER_SITE_OTHER): Payer: 59

## 2022-04-25 ENCOUNTER — Encounter: Payer: Self-pay | Admitting: Orthopedic Surgery

## 2022-04-25 ENCOUNTER — Ambulatory Visit (INDEPENDENT_AMBULATORY_CARE_PROVIDER_SITE_OTHER): Payer: 59 | Admitting: Orthopedic Surgery

## 2022-04-25 VITALS — BP 164/104 | HR 63 | Ht 63.0 in | Wt 166.5 lb

## 2022-04-25 DIAGNOSIS — M5440 Lumbago with sciatica, unspecified side: Secondary | ICD-10-CM | POA: Diagnosis not present

## 2022-04-25 MED ORDER — METHYLPREDNISOLONE 4 MG PO TBPK: ORAL_TABLET | ORAL | 0 refills | Status: DC

## 2022-04-25 MED ORDER — GABAPENTIN 300 MG PO CAPS: 300.0000 mg | ORAL_CAPSULE | Freq: Three times a day (TID) | ORAL | 0 refills | Status: AC

## 2022-04-25 NOTE — Progress Notes (Addendum)
Orthopedic Spine Surgery Office Note  Assessment: Patient is a 57 y.o. male with low back pain that radiates into bilateral buttock and posterior thighs.  Suspect radiculopathy   Plan: -Explained that initially conservative treatment is tried as a significant number of patients may experience relief with these treatment modalities. Discussed that the conservative treatments include:  -activity modification  -physical therapy  -over the counter pain medications  -medrol dosepak  -lumbar steroid injections -Patient has tried meloxicam, cyclobenzaprine, tramadol  -Told him to stop the cyclobenzaprine and meloxicam.  Prescribed Medrol Dosepak and gabapentin -Patient has tried over 6 weeks of conservative treatment with his primary care doctor, so recommended MRI of the lumbar spine to evaluate for radiculopathy -Patient should return to office in 4 weeks, x-rays at next visit: none   Patient expressed understanding of the plan and all questions were answered to the patient's satisfaction.   ___________________________________________________________________________   History:  Patient is a 57 y.o. male who presents today for lumbar spine.  Patient has had over 2 months of low back pain that radiates into the bilateral buttocks and posterior thighs.  It radiates to the posterior aspect of the proximal leg but does not radiate any further distally.  He feels the pain equally on both lower extremities.  There was no trauma or injury that brought on the pain.  He says when the pain is severe he feels off balance.  Feels pain even at rest but notes it is worse when he is active or changing position (he gives example of going from seated to standing position) has not noticed any weakness.  Denies paresthesias and numbness.   Weakness: Denies Symptoms of imbalance: Yes, at times when pain is severe Paresthesias and numbness: Denies Bowel or bladder incontinence: Denies Saddle anesthesia:  Denies  Treatments tried: Meloxicam, cyclobenzaprine, tramadol  Review of systems: Denies fevers and chills, night sweats, unexplained weight loss, history of cancer, pain that wakes them at night  Past medical history: CAD CKD HLD HTN Aortic stenosis History of NSTEMI Pre-diabetes (last A1C was 6.3 on 02/08/2022)  Allergies: NKDA  Past surgical history:  Biliary dilation Cholecystectomy Sphincterotomy  Social history: Denies use of nicotine product (smoking, vaping, patches, smokeless) Alcohol use: Denies Denies recreational drug use   Physical Exam:  BMI of 29.5  General: no acute distress, appears stated age Neurologic: alert, answering questions appropriately, following commands Respiratory: unlabored breathing on room air, symmetric chest rise Psychiatric: appropriate affect, normal cadence to speech   MSK (spine):  -Strength exam      Left  Right EHL    5/5  5/5 TA    5/5  5/5 GSC    5/5  5/5 Knee extension  5/5  5/5 Hip flexion   5/5  5/5  -Sensory exam    Sensation intact to light touch in L3-S1 nerve distributions of bilateral lower extremities  -Achilles DTR: 2/4 on the left, 2/4 on the right -Patellar tendon DTR: 2/4 on the left, 2/4 on the right  -Straight leg raise: Negative bilaterally -Femoral nerve stretch test: Negative bilaterally -Clonus: no beats bilaterally  -Left hip exam: No pain through range of motion, negative Stinchfield, negative FABER -Right hip exam: Positive FADIR, no pain through remainder of range of motion, negative Stinchfield, negative FABER  Imaging: XR of the lumbar spine from 03/21/2022 and 04/25/2022 was independently reviewed and interpreted, showing disc height loss at L5/S1. No fracture or dislocation seen. No evidence of instability on flexion/extension views.  Patient name: Ethan Silva Patient MRN: [id number] Date of visit: 04/25/22

## 2022-04-30 ENCOUNTER — Telehealth: Payer: Self-pay | Admitting: Emergency Medicine

## 2022-04-30 ENCOUNTER — Encounter (HOSPITAL_COMMUNITY): Payer: Self-pay

## 2022-04-30 ENCOUNTER — Other Ambulatory Visit: Payer: Self-pay | Admitting: Emergency Medicine

## 2022-04-30 ENCOUNTER — Ambulatory Visit (HOSPITAL_COMMUNITY)
Admission: RE | Admit: 2022-04-30 | Discharge: 2022-04-30 | Disposition: A | Discharge status: Home / Self Care | Payer: 59 | Source: Ambulatory Visit | Attending: Orthopedic Surgery | Admitting: Orthopedic Surgery

## 2022-04-30 DIAGNOSIS — M5440 Lumbago with sciatica, unspecified side: Secondary | ICD-10-CM

## 2022-04-30 DIAGNOSIS — F418 Other specified anxiety disorders: Secondary | ICD-10-CM

## 2022-04-30 MED ORDER — LORAZEPAM 1 MG PO TABS
ORAL_TABLET | ORAL | 0 refills | Status: DC
Start: 2022-04-30 — End: 2022-05-14

## 2022-04-30 NOTE — Telephone Encounter (Signed)
Recommend to take 1 mg of Ativan 1 hour before procedure.  New prescription sent to pharmacy of record today.  Thanks.

## 2022-04-30 NOTE — Telephone Encounter (Signed)
Called patient and informed him that a new rx was sent to his pharmacy

## 2022-04-30 NOTE — Telephone Encounter (Signed)
Pt states that he was unable to complete his MRI scan because he is too claustrophobic and could not go through the machine.   Pt is requesting medication to help him stay calm, or to be put under for this scan.  Please call pt to discuss: [phone]

## 2022-05-02 DIAGNOSIS — Z419 Encounter for procedure for purposes other than remedying health state, unspecified: Secondary | ICD-10-CM | POA: Diagnosis not present

## 2022-05-05 ENCOUNTER — Ambulatory Visit (HOSPITAL_COMMUNITY)
Admission: RE | Admit: 2022-05-05 | Discharge: 2022-05-05 | Disposition: A | Discharge status: Home / Self Care | Payer: 59 | Source: Ambulatory Visit | Attending: Orthopedic Surgery | Admitting: Orthopedic Surgery

## 2022-05-05 DIAGNOSIS — M5440 Lumbago with sciatica, unspecified side: Secondary | ICD-10-CM | POA: Diagnosis present

## 2022-05-11 ENCOUNTER — Other Ambulatory Visit (HOSPITAL_COMMUNITY): Payer: Self-pay

## 2022-05-11 ENCOUNTER — Telehealth: Payer: Self-pay

## 2022-05-11 NOTE — Telephone Encounter (Signed)
Pharmacy Patient Advocate Encounter   Received notification from [facility] that prior authorization for Nexlizet 180-10mg  is required/requested.   PA submitted on 5.10.24 to (ins) [facility] via Direct call at [phone]  pa# [id number]  Status is pending & (supporting documents have been faxed)

## 2022-05-12 ENCOUNTER — Other Ambulatory Visit: Payer: Self-pay | Admitting: Cardiology

## 2022-05-14 ENCOUNTER — Encounter: Payer: Self-pay | Admitting: Cardiology

## 2022-05-14 ENCOUNTER — Ambulatory Visit: Payer: 59 | Attending: Cardiology | Admitting: Cardiology

## 2022-05-14 VITALS — BP 154/98 | HR 60 | Ht 63.0 in | Wt 166.4 lb

## 2022-05-14 DIAGNOSIS — I25119 Atherosclerotic heart disease of native coronary artery with unspecified angina pectoris: Secondary | ICD-10-CM

## 2022-05-14 DIAGNOSIS — R7303 Prediabetes: Secondary | ICD-10-CM | POA: Diagnosis not present

## 2022-05-14 DIAGNOSIS — I10 Essential (primary) hypertension: Secondary | ICD-10-CM

## 2022-05-14 DIAGNOSIS — E785 Hyperlipidemia, unspecified: Secondary | ICD-10-CM | POA: Diagnosis not present

## 2022-05-14 DIAGNOSIS — I350 Nonrheumatic aortic (valve) stenosis: Secondary | ICD-10-CM | POA: Diagnosis not present

## 2022-05-14 DIAGNOSIS — M5440 Lumbago with sciatica, unspecified side: Secondary | ICD-10-CM

## 2022-05-14 DIAGNOSIS — Z6830 Body mass index (BMI) 30.0-30.9, adult: Secondary | ICD-10-CM

## 2022-05-14 MED ORDER — AMLODIPINE BESYLATE 10 MG PO TABS: 10.0000 mg | ORAL_TABLET | Freq: Every day | ORAL | 3 refills | Status: DC

## 2022-05-14 NOTE — Telephone Encounter (Signed)
Pharmacy Patient Advocate Encounter  Prior Authorization for  Nexlizet 180-10mg   has been approved by [facility]  (ins).    PA # [id number]  Effective dates: 5.12.24 through 5.12.25

## 2022-05-14 NOTE — Progress Notes (Unsigned)
Primary Care Provider: Georgina Quint, MD [facility] Cardiologist: Ethan Lemma, MD Electrophysiologist: None  Clinic Note: No chief complaint on file.  ===================================  ASSESSMENT/PLAN   Problem List Items Addressed This Visit       Cardiology Problems   Mild aortic stenosis by prior echocardiogram (Chronic)   Hyperlipidemia with target LDL less than 70 - Primary (Chronic)   Essential hypertension (Chronic)   Coronary artery disease involving native coronary artery of native heart with angina pectoris (HCC) (Chronic)     Other   Prediabetes (Chronic)   ===================================  HPI:    Ethan Silva is a 57 y.o. male with a PMH notable for modest CAD (with history of non-STEMI, no PCI target), HLD, HTN, and pre-DM who presents today for 32-month follow-up. at the request of Ethan Silva, Ethan Silva, *.  Ethan Silva was last seen on 11/13/2021 => noted off-and-on chest discomfort but not necessarily as exertional.  Usually better when he burps and worse when he eats spicy foods.  More likely GERD. -> Noted left knee pain disease ordered.  Otherwise doing well.  Had some anxiety  Recent Hospitalizations:  ER visit March 26, 2022 for lumbar strain.  Reviewed  CV studies:    The following studies were reviewed today: (if available, images/films reviewed: From Epic Chart or Care Everywhere) No new:  Interval History:   Ethan Silva   CV Review of Symptoms (Summary): {roscv:[id number]}  REVIEWED OF SYSTEMS   ROS   I have reviewed and (if needed) personally updated the patient's problem list, medications, allergies, past medical and surgical history, social and family history.   PAST MEDICAL HISTORY   Past Medical History:  Diagnosis Date   Aortic stenosis, mild 07/2012   Very mild aortic stenosis noted on echo-mean gradient 11 mmHg.   Bile duct leak    CAD in native  artery 07/14/2020   Small branch of OM 3 with 80% ostial stenosis.  Too small for PCI.  Otherwise questionable 30% LM and 30% PDA.   Chronic kidney disease 2017   left kidney stones   Headache(784.0)    Hx: of when BP is elevated   Hyperlipidemia    Hypertension    Non-STEMI (non-ST elevated myocardial infarction) (HCC) 07/13/2020   Admitted with hypertension and chest pain.  Cardiac cath showed small branch of an OM3 with 85% ostial stenosis not amenable to PCI.  No other significant disease noted.  Normal EF on echo.  Medical management.   Numbness and tingling in hands    Hx: of   Prediabetes     PAST SURGICAL HISTORY   Past Surgical History:  Procedure Laterality Date   BILIARY DILATION  03/16/2021   Procedure: BILIARY DILATION;  Surgeon: Rachael Fee, MD;  Location: [facility];  Service: Endoscopy;;   CHOLECYSTECTOMY N/A 07/11/2012   Procedure: LAPAROSCOPIC CHOLECYSTECTOMY WITH INTRAOPERATIVE CHOLANGIOGRAM;  Surgeon: Axel Filler, MD;  Location: [facility] OR;  Service: General;  Laterality: N/A;   COLONOSCOPY     ENDOSCOPIC RETROGRADE CHOLANGIOPANCREATOGRAPHY (ERCP) WITH PROPOFOL N/A 03/16/2021   Procedure: ENDOSCOPIC RETROGRADE CHOLANGIOPANCREATOGRAPHY (ERCP) WITH PROPOFOL;  Surgeon: Rachael Fee, MD;  Location: [facility];  Service: Endoscopy;  Laterality: N/A;   ERCP  07/16/2012   ERCP N/A 07/16/2012   Procedure: ENDOSCOPIC RETROGRADE CHOLANGIOPANCREATOGRAPHY (ERCP);  Surgeon: Louis Meckel, MD;  Location: [facility] OR;  Service: Gastroenterology;  Laterality: N/A;   ERCP N/A 10/06/2012   Procedure: ENDOSCOPIC RETROGRADE CHOLANGIOPANCREATOGRAPHY (ERCP);  Surgeon: Louis Meckel, MD;  Location: [facility];  Service: Endoscopy;  Laterality: N/A;   ESOPHAGOGASTRODUODENOSCOPY N/A 03/16/2021   Procedure: ESOPHAGOGASTRODUODENOSCOPY (EGD);  Surgeon: Rachael Fee, MD;  Location: [facility];  Service: Endoscopy;  Laterality: N/A;   EUS N/A 03/16/2021   Procedure: UPPER  ENDOSCOPIC ULTRASOUND (EUS) RADIAL;  Surgeon: Rachael Fee, MD;  Location: [facility];  Service: Endoscopy;  Laterality: N/A;   HAND SURGERY     LEFT HEART CATH AND CORONARY ANGIOGRAPHY N/A 07/14/2020   Procedure: LEFT HEART CATH AND CORONARY ANGIOGRAPHY;  Surgeon: Lyn Records, MD;  Location: [facility] INVASIVE [facility];  Service: Cardiovascular; ? Culprit Lesion ~ 80% ostial small OM3 (too small & ostial for PCI). ~30% mid LM (at a bend).  LAD with D1 & D2 - normal, RCA minimal luminal irregularities ~ 30%. PDA   SPHINCTEROTOMY  03/16/2021   Procedure: SPHINCTEROTOMY;  Surgeon: Rachael Fee, MD;  Location: [facility];  Service: Endoscopy;;   TRANSTHORACIC ECHOCARDIOGRAM  07/14/2020   (NSTEMI/Accelerated Hypertension): EF 55 to 60%.  No RWM A.  Very mild Aortic Stenosis-mean gradient 11 to mmHg.   MEDICATIONS/ALLERGIES   Current Meds  Medication Sig   acetaminophen (TYLENOL) 325 MG tablet Take 325 mg by mouth every 6 (six) hours as needed for moderate pain.   amLODipine (NORVASC) 5 MG tablet Take 1 tablet (5 mg total) by mouth daily.   aspirin 81 MG EC tablet Take 1 tablet (81 mg total) by mouth daily. Swallow whole.   Bempedoic Acid-Ezetimibe (NEXLIZET) 180-10 MG TABS Take 1 tablet by mouth daily.   gabapentin (NEURONTIN) 300 MG capsule Take 1 capsule (300 mg total) by mouth 3 (three) times daily.   lisinopril (ZESTRIL) 20 MG tablet Take 1 tablet (20 mg total) by mouth daily.   methylPREDNISolone (MEDROL DOSEPAK) 4 MG TBPK tablet Take as prescribed on the box   Multiple Vitamins-Minerals (MULTI FOR HIM) TABS Take 1 tablet by mouth daily.   naproxen (NAPROSYN) 375 MG tablet Take 1 tablet (375 mg total) by mouth 2 (two) times daily as needed.   nitroGLYCERIN (NITROSTAT) 0.4 MG SL tablet Place 1 tablet (0.4 mg total) under the tongue every 5 (five) minutes x 3 doses as needed for chest pain.   pantoprazole (PROTONIX) 40 MG tablet Take 1 tablet (40 mg total) by mouth daily.   polyethylene  glycol powder (GLYCOLAX/MIRALAX) 17 GM/SCOOP powder Take 17 g by mouth 2 (two) times daily as needed.   rosuvastatin (CRESTOR) 40 MG tablet Take 1 tablet (40 mg total) by mouth daily.    No Known Allergies  SOCIAL HISTORY/FAMILY HISTORY   Reviewed in Epic:  Pertinent findings:  Social History   Tobacco Use   Smoking status: Never    Passive exposure: Never   Smokeless tobacco: Never  Vaping Use   Vaping Use: Never used  Substance Use Topics   Alcohol use: No   Drug use: No   Social History   Social History Narrative   Originally from [place], [place]. Came to the [place] in 1991.   Lives with his wife and their youngest son   His daughter lives in [place], [place] with her husband.  Second son is now moved out of the house as well.   He gets a decent amount of exercise walking around at work, but does not do routine exercise.  Does not drink or smoke    OBJCTIVE -PE, EKG, labs   Wt Readings from Last 3 Encounters:  05/14/22 166 lb 6.4 oz (75.5 kg)  04/25/22 166 lb 8 oz (75.5 kg)  04/16/22 166 lb 6 oz (75.5 kg)    Physical Exam: BP (!) 154/98   Pulse 60   Ht 5\' 3"  (1.6 m)   Wt 166 lb 6.4 oz (75.5 kg)   SpO2 97%   BMI 29.48 kg/m  Physical Exam   Adult ECG Report  Rate: *** ;  Rhythm: {rhythm:17366};   Narrative Interpretation: ***  Recent Labs:  check today  Lab Results  Component Value Date   CHOL 165 02/08/2022   HDL 40 02/08/2022   LDLCALC 99 02/08/2022   TRIG 148 02/08/2022   CHOLHDL 4.1 02/08/2022   Lab Results  Component Value Date   CREATININE 0.90 02/08/2022   BUN 21 02/08/2022   NA 141 02/08/2022   K 4.7 02/08/2022   CL 103 02/08/2022   CO2 25 02/08/2022   Lab Results  Component Value Date   HGBA1C 6.3 (H) 02/08/2022   ================================================== I spent a total of ***minutes with the patient spent in direct patient consultation.  Additional time spent with chart review  / charting (studies, outside notes, etc): ***  min Total Time: *** min  Current medicines are reviewed at length with the patient today.  (+/- concerns) ***  Notice: This dictation was prepared with Dragon dictation along with smart phrase technology. Any transcriptional errors that result from this process are unintentional and may not be corrected upon review.  Studies Ordered:   No orders of the defined types were placed in this encounter.  No orders of the defined types were placed in this encounter.   Patient Instructions / Medication Changes & Studies & Tests Ordered   There are no Patient Instructions on file for this visit.     Marykay Lex, MD, MS Ethan Silva, M.D., M.S. Interventional Cardiologist  [facility]  Pager # [phone] Phone # [phone] [place], [place] [address]   Thank you for choosing [facility] [place]!!

## 2022-05-14 NOTE — Patient Instructions (Addendum)
Medication Instructions:  Increase Amlodipine 10 mg  daily   *If you need a refill on your cardiac medications before your next appointment, please call your pharmacy*   Lab Work: Lipid CMP hgbA1c LPa If you have labs (blood work) drawn today and your tests are completely normal, you will receive your results only by: MyChart Message (if you have MyChart) OR A paper copy in the mail If you have any lab test that is abnormal or we need to change your treatment, we will call you to review the results.   Testing/Procedures: Not needed   Follow-Up: At [facility], you and your health needs are our priority.  As part of our continuing mission to provide you with exceptional heart care, we have created designated Provider Care Teams.  These Care Teams include your primary Cardiologist (physician) and Advanced Practice Providers (APPs -  Physician Assistants and Nurse Practitioners) who all work together to provide you with the care you need, when you need it.     Your next appointment:   6 month(s)  The format for your next appointment:   In Person  Provider:   Bryan Lemma, MD   You have been referred to [facility] - at [facility]  3 to 4 weeks   Other Instructions

## 2022-05-15 LAB — COMPREHENSIVE METABOLIC PANEL
ALT: 34 IU/L (ref 0–44)
AST: 23 IU/L (ref 0–40)
Albumin/Globulin Ratio: 1.7 (ref 1.2–2.2)
Albumin: 4.6 g/dL (ref 3.8–4.9)
Alkaline Phosphatase: 49 IU/L (ref 44–121)
BUN/Creatinine Ratio: 17 (ref 9–20)
BUN: 17 mg/dL (ref 6–24)
Bilirubin Total: <0.2 mg/dL (ref 0.0–1.2)
CO2: 24 mmol/L (ref 20–29)
Calcium: 9.7 mg/dL (ref 8.7–10.2)
Chloride: 102 mmol/L (ref 96–106)
Creatinine, Ser: 0.98 mg/dL (ref 0.76–1.27)
Globulin, Total: 2.7 g/dL (ref 1.5–4.5)
Glucose: 108 mg/dL — ABNORMAL HIGH (ref 70–99)
Potassium: 4.4 mmol/L (ref 3.5–5.2)
Sodium: 142 mmol/L (ref 134–144)
Total Protein: 7.3 g/dL (ref 6.0–8.5)
eGFR: 89 mL/min/{1.73_m2} (ref >59)

## 2022-05-15 LAB — HEMOGLOBIN A1C
Est. average glucose Bld gHb Est-mCnc: 134 mg/dL
Hgb A1c MFr Bld: 6.3 % — ABNORMAL HIGH (ref 4.8–5.6)

## 2022-05-15 LAB — LIPOPROTEIN A (LPA): Lipoprotein (a): 38 nmol/L (ref <75.0)

## 2022-05-15 LAB — LIPID PANEL
Chol/HDL Ratio: 4.9 ratio (ref 0.0–5.0)
Cholesterol, Total: 240 mg/dL — ABNORMAL HIGH (ref 100–199)
HDL: 49 mg/dL (ref >39)
LDL Chol Calc (NIH): 160 mg/dL — ABNORMAL HIGH (ref 0–99)
Triglycerides: 171 mg/dL — ABNORMAL HIGH (ref 0–149)
VLDL Cholesterol Cal: 31 mg/dL (ref 5–40)

## 2022-05-17 ENCOUNTER — Encounter: Payer: Self-pay | Admitting: Cardiology

## 2022-05-17 NOTE — Assessment & Plan Note (Signed)
A1c was up to 6.3.  This puts him relatively close to the cutoff for diabetes.  Needed monitor diet.

## 2022-05-17 NOTE — Assessment & Plan Note (Addendum)
High pressure today.  Will increase amlodipine to 10 mg but otherwise continue 20 mg lisinopril.  No beta-blocker because of bradycardia.  Not on diuretic either.  Will potentially consider potassium sparing diuretic.  Plan will be 3 to 4-week follow-up with CVRR in 6 months with me.  Would ask our clinical pharmacist to assess both his lipids and his blood pressure.

## 2022-05-17 NOTE — Assessment & Plan Note (Signed)
Defer to PCP.  This does seem somewhat bothersome to him.  All the more reason to try to avoid requirement for PCP evaluation.

## 2022-05-17 NOTE — Assessment & Plan Note (Addendum)
Last LDL was 99.  Would like to reassess lipids to see if he is going the right direction or not.  If not would need to potentially consider PCSK9 inhibitor.  I know with his healthcare coverage being Medicaid there may be some difficulty with PCSK9 inhibitors.  Will refer to [facility] clinical pharmacy team who will also check his blood pressures and follow-up.  WILL also check LP(a) as this may change symptoms.  Plan: Continue rosuvastatin. Continue Nexlizet Recheck lipids now.  If not at goal, would probably need to consider PCSK9 inhibitor or inclisiran.  Refer to lipid clinic based on results.

## 2022-05-17 NOTE — Assessment & Plan Note (Signed)
Discussed importance of dietary modification and staying active with exercise.

## 2022-05-17 NOTE — Assessment & Plan Note (Signed)
Pretty low gradient on echo.  Probably do not need to recheck until next year.

## 2022-05-17 NOTE — Assessment & Plan Note (Addendum)
Stable.  No further real anginal symptoms I think that the symptoms feeling is probably more musculoskeletal in nature.  However, since there is a possibility of microvascular disease we will go ahead and increase amlodipine to 10 mg otherwise.  Discontinue lisinopril.  No beta-blocker because of bradycardia. Continue aspirin

## 2022-05-23 ENCOUNTER — Ambulatory Visit (INDEPENDENT_AMBULATORY_CARE_PROVIDER_SITE_OTHER): Payer: 59 | Admitting: Orthopedic Surgery

## 2022-05-23 DIAGNOSIS — M5416 Radiculopathy, lumbar region: Secondary | ICD-10-CM

## 2022-05-23 NOTE — Progress Notes (Signed)
Orthopedic Spine Surgery Office Note   Assessment: Patient is a 57 y.o. male with low back pain that radiates into bilateral buttock and posterior thighs.  Has central and lateral recess stenosis at L4/5     Plan: -Explained that initially conservative treatment is tried as a significant number of patients may experience relief with these treatment modalities. Discussed that the conservative treatments include:             -activity modification             -physical therapy             -over the counter pain medications             -medrol dosepak             -lumbar steroid injections -Patient has tried meloxicam, cyclobenzaprine, tramadol, Medrol Dosepak, gabapentin -Recommended a diagnostic/therapeutic lumbar steroid injection as an additional nonoperative treatment to try.  If he does not get sustained relief with this treatment, would recommend L4 and L5 segment laminectomies -Patient should return to office in 4 weeks, x-rays at next visit: none     Patient expressed understanding of the plan and all questions were answered to the patient's satisfaction.    ___________________________________________________________________________     History:   Patient is a 57 y.o. male who presents today for lumbar spine.  Patient comes back into the office today for routine follow-up.  He is still having low back pain that radiates into the bilateral buttock and posterior thighs.  His pain is unchanged in the last time I saw him. The medrol dose pak helped temporarily. The gabapentin did not provide him with any relief. He feels the pain constantly and on a daily basis.  He does note that the pain is worse when he is walking or changing position.  He has not noticed any weakness.  Denies paresthesias and numbness.  No saddle anesthesia.  No change in bowel or bladder habits.  Treatments tried: Meloxicam, cyclobenzaprine, tramadol, Medrol Dosepak, gabapentin     Physical Exam:   General: no  acute distress, appears stated age Neurologic: alert, answering questions appropriately, following commands Respiratory: unlabored breathing on room air, symmetric chest rise Psychiatric: appropriate affect, normal cadence to speech     MSK (spine):   -Strength exam                                                   Left                  Right EHL                              5/5                  5/5 TA                                 5/5                  5/5 GSC                             5/5  5/5 Knee extension            5/5                  5/5 Hip flexion                    5/5                  5/5   -Sensory exam                           Sensation intact to light touch in L3-S1 nerve distributions of bilateral lower extremities    -Straight leg raise: Negative bilaterally -Femoral nerve stretch test: Negative bilaterally -Clonus: no beats bilaterally   Imaging: XR of the lumbar spine from 03/21/2022 and 04/25/2022 was previously independently reviewed and interpreted, showing disc height loss at L5/S1. No fracture or dislocation seen. No evidence of instability on flexion/extension views.    MRI of the lumbar spine from 05/05/2022 was independently reviewed and interpreted, showing central and lateral recess stenosis at L4/5.  Bilateral foraminal stenosis at L3/4 and L4/5.    Patient name: Ethan Silva Patient MRN: [id number] Date of visit: 05/23/22

## 2022-05-30 ENCOUNTER — Telehealth: Payer: Self-pay | Admitting: *Deleted

## 2022-05-30 NOTE — Telephone Encounter (Signed)
used [employer], retail # [id number] ( [employer])  The patient has been notified of the result and verbalized understanding.  All questions (if any) were answered.aware to bring medication to appointment on 06/18/22 with [facility] Tobin Chad, RN 05/30/2022 2:40 PM

## 2022-05-30 NOTE — Telephone Encounter (Signed)
-----   Message from Marykay Lex, MD sent at 05/17/2022  7:55 PM EDT ----- Cholesterol panel shows LDL up to 160 with total cholesterol 240 triglycerides 170.  This is completely out of goal.  I am not sure if he is actually taking his rosuvastatin and Nexlizet.    This will be discussed when he sees our clinical pharmacy team  Bryan Lemma, MD

## 2022-06-02 DIAGNOSIS — Z419 Encounter for procedure for purposes other than remedying health state, unspecified: Secondary | ICD-10-CM | POA: Diagnosis not present

## 2022-06-07 ENCOUNTER — Other Ambulatory Visit: Payer: Self-pay

## 2022-06-07 ENCOUNTER — Ambulatory Visit (INDEPENDENT_AMBULATORY_CARE_PROVIDER_SITE_OTHER): Payer: 59 | Admitting: Physical Medicine and Rehabilitation

## 2022-06-07 VITALS — BP 163/95 | HR 67

## 2022-06-07 DIAGNOSIS — M5416 Radiculopathy, lumbar region: Secondary | ICD-10-CM | POA: Diagnosis not present

## 2022-06-07 MED ORDER — METHYLPREDNISOLONE ACETATE 80 MG/ML IJ SUSP
80.0000 mg | Freq: Once | INTRAMUSCULAR | Status: AC
  Administered 2022-06-07: 80 mg

## 2022-06-07 NOTE — Progress Notes (Signed)
[place] - 57 y.o. male MRN [id number]  Date of birth: 03-Apr-1965  Office Visit Note: Visit Date: 06/07/2022 PCP: Georgina Quint, MD Referred by: London Sheer, MD  Subjective: Chief Complaint  Patient presents with   Lower Back - Pain   HPI:  Ethan Silva is a 57 y.o. male who comes in today at the request of Dr. Willia Craze for planned Right L4-5 Lumbar Interlaminar epidural steroid injection with fluoroscopic guidance.  The patient has failed conservative care including home exercise, medications, time and activity modification.  This injection will be diagnostic and hopefully therapeutic.  Please see requesting physician notes for further details and justification.   ROS Otherwise per HPI.  Assessment & Plan: Visit Diagnoses:    ICD-10-CM   1. Lumbar radiculopathy  M54.16 XR C-ARM NO REPORT    Epidural Steroid injection    methylPREDNISolone acetate (DEPO-MEDROL) injection 80 mg      Plan: No additional findings.   Meds & Orders:  Meds ordered this encounter  Medications   methylPREDNISolone acetate (DEPO-MEDROL) injection 80 mg    Orders Placed This Encounter  Procedures   XR C-ARM NO REPORT   Epidural Steroid injection    Follow-up: Return for visit to requesting provider as needed.   Procedures: No procedures performed  Lumbar Epidural Steroid Injection - Interlaminar Approach with Fluoroscopic Guidance  Patient: Ethan Silva      Date of Birth: 1965/12/05 MRN: [id number] PCP: Georgina Quint, MD      Visit Date: 06/07/2022   Universal Protocol:     Consent Given By: the patient  Position: PRONE  Additional Comments: Vital signs were monitored before and after the procedure. Patient was prepped and draped in the usual sterile fashion. The correct patient, procedure, and site was verified.   Injection Procedure Details:   Procedure diagnoses: Lumbar radiculopathy [M54.16]    Meds Administered:  Meds ordered this encounter  Medications   methylPREDNISolone acetate (DEPO-MEDROL) injection 80 mg     Laterality: Right  Location/Site:  L4-5  Needle: 3.5 in., 20 ga. Tuohy  Needle Placement: Paramedian epidural  Findings:   -Comments: Excellent flow of contrast into the epidural space. He did have a vasocagal response but did well with resting in the recovery area.  Procedure Details: Using a paramedian approach from the side mentioned above, the region overlying the inferior lamina was localized under fluoroscopic visualization and the soft tissues overlying this structure were infiltrated with 4 ml. of 1% Lidocaine without Epinephrine. The Tuohy needle was inserted into the epidural space using a paramedian approach.   The epidural space was localized using loss of resistance along with counter oblique bi-planar fluoroscopic views.  After negative aspirate for air, blood, and CSF, a 2 ml. volume of Isovue-250 was injected into the epidural space and the flow of contrast was observed. Radiographs were obtained for documentation purposes.    The injectate was administered into the level noted above.   Additional Comments:  No complications occurred Dressing: 2 x 2 sterile gauze and Band-Aid    Post-procedure details: Patient was observed during the procedure. Post-procedure instructions were reviewed.  Patient left the clinic in stable condition.   Clinical History: MRI LUMBAR SPINE WITHOUT CONTRAST   TECHNIQUE: Multiplanar, multisequence MR imaging of the lumbar spine was performed. No intravenous contrast was administered.   COMPARISON:  Radiographs April 25, 2022. CT chest, abdomen and April 23, 2021.   FINDINGS: Segmentation: Transitional anatomy. Utilizing prior  CT as reference, there are 12 rib-bearing vertebrae followed by 1 transitional vertebra with rudimentary ribs and 5 lumbar type, non rib-bearing vertebrae. For the purpose of  this study, the transitional thoracolumbar vertebra with rudimentary ribs is named L1 and the last well-formed disc space corresponds to S1-2.   Alignment:  Physiologic.   Vertebrae: No fracture, evidence of discitis, or bone lesion. Congenitally small spinal canal.   Conus medullaris and cauda equina: Conus extends to the L2 level. Conus and cauda equina appear normal.   Paraspinal and other soft tissues: Bosniak I/Bosniak II benign renal cyst measuring up to 3 cm. No follow-up imaging is recommended. [employer] 2021; [id number], Bosniak Classification of Cystic Renal Masses, Version 2019.   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   Accessory piriform muscle bilaterally covering sacral foramina (series 6 and 7, image 48).   Disc levels:   T12-L1: No spinal canal or neural foraminal stenosis.   L1-2: No spinal canal or neural foraminal stenosis.   L2-3: Disc bulge and mild facet degenerative changes resulting in mild spinal canal stenosis and mild bilateral neural foraminal narrowing.   L3-4: Disc bulge and mild facet degenerative changes resulting in mild spinal canal stenosis with mild narrowing of the bilateral subarticular zone and mild to moderate bilateral neural foraminal narrowing.   L4-5: Disc bulge, mild-to-moderate facet degenerative changes resulting in mild spinal canal stenosis and moderate bilateral neural foraminal narrowing.   L5-S1: Disc bulge, advanced hypertrophic facet degenerative changes with trace bilateral joint effusion and ligamentum flavum redundancy resulting in severe spinal canal stenosis and severe bilateral neural foraminal narrowing.   S1-2: Prominent hypertrophic facet degenerative changes, right greater than left resulting in mild bilateral neural foraminal narrowing. No significant spinal canal stenosis.   IMPRESSION: 1. Transitional anatomy.  Please see detailed description above. 2. Degenerative  changes of the lumbar spine superimposed on a congenitally small spinal canal resulting in severe spinal canal stenosis and severe bilateral neural foraminal narrowing at L5-S1. 3. Mild spinal canal stenosis at L2-3, L3-4 and L4-5. 4. Mild to moderate bilateral neural foraminal narrowing at L3-4. 5. Moderate bilateral neural foraminal narrowing at L4-5. 6. Accessory piriform muscle bilaterally.     Electronically Signed   By: Baldemar Lenis M.D.   On: 05/10/2022 16:09     Objective:  VS:  HT:    WT:   BMI:     BP:(!) 163/95  HR:67bpm  TEMP: ( )  RESP:  Physical Exam Vitals and nursing note reviewed.  Constitutional:      General: He is not in acute distress.    Appearance: Normal appearance. He is not ill-appearing.  HENT:     Head: Normocephalic and atraumatic.     Right Ear: External ear normal.     Left Ear: External ear normal.     Nose: No congestion.  Eyes:     Extraocular Movements: Extraocular movements intact.  Cardiovascular:     Rate and Rhythm: Normal rate.     Pulses: Normal pulses.  Pulmonary:     Effort: Pulmonary effort is normal. No respiratory distress.  Abdominal:     General: There is no distension.     Palpations: Abdomen is soft.  Musculoskeletal:        General: No tenderness or signs of injury.     Cervical back: Neck supple.     Right lower leg: No edema.     Left lower leg: No edema.  Comments: Patient has good distal strength without clonus.  Skin:    Findings: No erythema or rash.  Neurological:     General: No focal deficit present.     Mental Status: He is alert and oriented to person, place, and time.     Sensory: No sensory deficit.     Motor: No weakness or abnormal muscle tone.     Coordination: Coordination normal.  Psychiatric:        Mood and Affect: Mood normal.        Behavior: Behavior normal.      Imaging: No results found.

## 2022-06-07 NOTE — Patient Instructions (Signed)

## 2022-06-07 NOTE — Progress Notes (Signed)
Functional Pain Scale - descriptive words and definitions  Distracting (5)    Aware of pain/able to complete some ADL's but limited by pain/sleep is affected and active distractions are only slightly useful. Moderate range order  Average Pain  varies, pain when doing an activity and changing it   +Driver, -BT, -Dye Allergies.  Lower back pain in the middle that can alternate between both sides and radiates into both legs from waist to knees

## 2022-06-07 NOTE — Procedures (Signed)
Lumbar Epidural Steroid Injection - Interlaminar Approach with Fluoroscopic Guidance  Patient: Ethan Silva      Date of Birth: 02-Jul-1965 MRN: [id number] PCP: Georgina Quint, MD      Visit Date: 06/07/2022   Universal Protocol:     Consent Given By: the patient  Position: PRONE  Additional Comments: Vital signs were monitored before and after the procedure. Patient was prepped and draped in the usual sterile fashion. The correct patient, procedure, and site was verified.   Injection Procedure Details:   Procedure diagnoses: Lumbar radiculopathy [M54.16]   Meds Administered:  Meds ordered this encounter  Medications   methylPREDNISolone acetate (DEPO-MEDROL) injection 80 mg     Laterality: Right  Location/Site:  L4-5  Needle: 3.5 in., 20 ga. Tuohy  Needle Placement: Paramedian epidural  Findings:   -Comments: Excellent flow of contrast into the epidural space. He did have a vasocagal response but did well with resting in the recovery area.  Procedure Details: Using a paramedian approach from the side mentioned above, the region overlying the inferior lamina was localized under fluoroscopic visualization and the soft tissues overlying this structure were infiltrated with 4 ml. of 1% Lidocaine without Epinephrine. The Tuohy needle was inserted into the epidural space using a paramedian approach.   The epidural space was localized using loss of resistance along with counter oblique bi-planar fluoroscopic views.  After negative aspirate for air, blood, and CSF, a 2 ml. volume of Isovue-250 was injected into the epidural space and the flow of contrast was observed. Radiographs were obtained for documentation purposes.    The injectate was administered into the level noted above.   Additional Comments:  No complications occurred Dressing: 2 x 2 sterile gauze and Band-Aid    Post-procedure details: Patient was observed during the  procedure. Post-procedure instructions were reviewed.  Patient left the clinic in stable condition.

## 2022-06-18 ENCOUNTER — Ambulatory Visit: Payer: 59 | Attending: Cardiovascular Disease | Admitting: Pharmacist Clinician (PhC)/ Clinical Pharmacy Specialist

## 2022-06-18 ENCOUNTER — Encounter: Payer: Self-pay | Admitting: Pharmacist Clinician (PhC)/ Clinical Pharmacy Specialist

## 2022-06-18 VITALS — BP 147/89 | HR 58

## 2022-06-18 DIAGNOSIS — E785 Hyperlipidemia, unspecified: Secondary | ICD-10-CM | POA: Diagnosis not present

## 2022-06-18 DIAGNOSIS — I10 Essential (primary) hypertension: Secondary | ICD-10-CM | POA: Diagnosis not present

## 2022-06-18 MED ORDER — LISINOPRIL 40 MG PO TABS: 40.0000 mg | ORAL_TABLET | Freq: Every day | ORAL | 3 refills | Status: DC

## 2022-06-18 NOTE — Progress Notes (Unsigned)
Office Visit    Patient Name: Ethan Silva Date of Encounter: 06/18/2022  Primary Care Provider:  Georgina Quint, MD Primary Cardiologist:  Bryan Lemma, MD  Chief Complaint    Hyperlipidemia, hypertension  Significant Past Medical History   HTN Amlodipine increased to 10 mg at last visit, also lisinopril  CAD 7/22 cath - 80% 3rd OM, mid LAD 30-40%, prox RCA 30%  preDM 5/24 A1c 6.3     No Known Allergies  History of Present Illness    Ethan Silva is a 57 y.o. male patient of Dr Herbie Baltimore, in the office today to discuss options for managing cholesterol as well as hypertension.   He comes with a [place] interpreter.  He saw Dr. Herbie Baltimore last month, BP was at 154/98 and amlodipine was increased to 10 mg.   He states he has checked home readings several times and that they are still running higher than goal.  For his cholesterol he was prescribed rosuvastatin and Nexlizet.  From conversation it sounds like he was only taking the Nexlizet, but it became cost prohibitive and he then re-started rosuvastatin.  He has been on that for only 3 days now.    Insurance Carrier: Higher education careers adviser preferred - [place] 4, [place], 28 day/fill limit)  LDL Cholesterol goal:  LDL < 70  BP goal:  < 130/80  Current Medications:   rosuvastatin 40 mg   Amlodipine 10 mg qd, (pm) lisinopril 20 mg every day (am)   Family Hx:  mother with high BP, not well controlled; father deceased - gall bladder surgery complications; 1 brother with hypertension; youngest has BP and palpitations   Social Hx: Tobacco:  no Alcohol:  no   Caffeine: only mint tea on occasion  Diet:   mostly ome cooked meals; grilled chicken, or makes broth with vegetables; doesn't snack much, but occasional crackers   Exercise: on his feet, moving around all day - works as a Education administrator  BP readings: Patient reports that home readings can vary by as much as 20 points from left to right (with left usually  being lower.  No specific readings with him today.    Accessory Clinical Findings   Lab Results  Component Value Date   CHOL 240 (H) 05/14/2022   HDL 49 05/14/2022   LDLCALC 160 (H) 05/14/2022   TRIG 171 (H) 05/14/2022   CHOLHDL 4.9 05/14/2022    Lipoprotein (a)  Date/Time Value Ref Range Status  05/14/2022 09:20 AM 38.0 <75.0 nmol/L Final    Comment:    Note:  Values greater than or equal to 75.0 nmol/L may        indicate an independent risk factor for CHD,        but must be evaluated with caution when applied        to non-Caucasian populations due to the        influence of genetic factors on Lp(a) across        ethnicities.     Lab Results  Component Value Date   ALT 34 05/14/2022   AST 23 05/14/2022   ALKPHOS 49 05/14/2022   BILITOT <0.2 05/14/2022   Lab Results  Component Value Date   CREATININE 0.98 05/14/2022   BUN 17 05/14/2022   NA 142 05/14/2022   K 4.4 05/14/2022   CL 102 05/14/2022   CO2 24 05/14/2022   Lab Results  Component Value Date   HGBA1C 6.3 (H) 05/14/2022    Home  Medications    Current Outpatient Medications  Medication Sig Dispense Refill   amLODipine (NORVASC) 10 MG tablet Take 1 tablet (10 mg total) by mouth daily. 90 tablet 3   aspirin 81 MG EC tablet Take 1 tablet (81 mg total) by mouth daily. Swallow whole. 90 tablet 3   lisinopril (ZESTRIL) 40 MG tablet Take 1 tablet (40 mg total) by mouth daily. 90 tablet 3   nitroGLYCERIN (NITROSTAT) 0.4 MG SL tablet Place 1 tablet (0.4 mg total) under the tongue every 5 (five) minutes x 3 doses as needed for chest pain. 25 tablet 6   pantoprazole (PROTONIX) 40 MG tablet Take 1 tablet by mouth once daily 90 tablet 3   rosuvastatin (CRESTOR) 40 MG tablet Take 1 tablet (40 mg total) by mouth daily. 90 tablet 3   acetaminophen (TYLENOL) 325 MG tablet Take 325 mg by mouth every 6 (six) hours as needed for moderate pain.     gabapentin (NEURONTIN) 300 MG capsule Take 1 capsule (300 mg total) by  mouth 3 (three) times daily. 90 capsule 0   polyethylene glycol powder (GLYCOLAX/MIRALAX) 17 GM/SCOOP powder Take 17 g by mouth 2 (two) times daily as needed. 3350 g 1   No current facility-administered medications for this visit.     Assessment & Plan    Essential hypertension Assessment: BP is uncontrolled in office BP 147/89 mmHg;  above the goal (<130/80). Tolerates amlodipine and lisinopril well without any side effects Denies SOB, palpitation, chest pain, headaches,or swelling Reiterated the importance of regular exercise and low salt diet   Plan:  Increase lisinopril to 40 mg once daily Continue taking amlodipine 10 mg once daily Patient to keep record of BP readings with heart rate and report to [place] at the next visit Patient to follow up with PharmD in 6 weeks  Labs ordered today: BMET - 1 months   Hyperlipidemia with target LDL less than 70 Assessment: Patient with ASCVD not at LDL goal of < 70 Most recent LDL 160 on 05/14/22 Started rosuvastatin 40 mg daily just 3 days ago Unable to afford Nexlizet, not on insurance formulary  Plan: Patient to continue with rosuvastatin 40 mg daily Repeat labs after:  1 month Lipid Liver function Will look into Praluent at next visit, insurance lists at Tier 4   Phillips Hay, PharmD CPP [facility] [address]  [place], [place] [address] [phone]  06/18/2022, 12:22 PM

## 2022-06-18 NOTE — Patient Instructions (Signed)
Follow up appointment: July 30 at 8:30 am  Go to the lab in 1 month - around July 15-20 (to check cholesterol levels and kidney function)  Take your BP meds as follows:  Increase lisinopril to 40 mg once daily (take 2 of the 20 mg tablets until gone)  Take you cholesterol medication as follows:  Continue rosuvastatin 40 mg once daily.   Check your blood pressure at home daily (if able) and keep record of the readings.  Hypertension "High blood pressure"  Hypertension is often called "The Silent Killer." It rarely causes symptoms until it is extremely  high or has done damage to other organs in the body. For this reason, you should have your  blood pressure checked regularly by your physician. We will check your blood pressure  every time you see a provider at one of our offices.   Your blood pressure reading consists of two numbers. Ideally, blood pressure should be  below 120/80. The first ("top") number is called the systolic pressure. It measures the  pressure in your arteries as your heart beats. The second ("bottom") number is called the diastolic pressure. It measures the pressure in your arteries as the heart relaxes between beats.  The benefits of getting your blood pressure under control are enormous. A 10-point  reduction in systolic blood pressure can reduce your risk of stroke by 27% and heart failure by 28%  Your blood pressure goal is < 130/80  To check your pressure at home you will need to:  1. Sit up in a chair, with feet flat on the floor and back supported. Do not cross your ankles or legs. 2. Rest your left arm so that the cuff is about heart level. If the cuff goes on your upper arm,  then just relax the arm on the table, arm of the chair or your lap. If you have a wrist cuff, we  suggest relaxing your wrist against your chest (think of it as Pledging the Flag with the  wrong arm).  3. Place the cuff snugly around your arm, about 1 inch above the crook of  your elbow. The  cords should be inside the groove of your elbow.  4. Sit quietly, with the cuff in place, for about 5 minutes. After that 5 minutes press the power  button to start a reading. 5. Do not talk or move while the reading is taking place.  6. Record your readings on a sheet of paper. Although most cuffs have a memory, it is often  easier to see a pattern developing when the numbers are all in front of you.  7. You can repeat the reading after 1-3 minutes if it is recommended  Make sure your bladder is empty and you have not had caffeine or tobacco within the last 30 min  Always bring your blood pressure log with you to your appointments. If you have not brought your monitor in to be double checked for accuracy, please bring it to your next appointment.  You can find a list of quality blood pressure cuffs at validatebp.org

## 2022-06-18 NOTE — Assessment & Plan Note (Signed)
Assessment: BP is uncontrolled in office BP 147/89 mmHg;  above the goal (<130/80). Tolerates amlodipine and lisinopril well without any side effects Denies SOB, palpitation, chest pain, headaches,or swelling Reiterated the importance of regular exercise and low salt diet   Plan:  Increase lisinopril to 40 mg once daily Continue taking amlodipine 10 mg once daily Patient to keep record of BP readings with heart rate and report to [place] at the next visit Patient to follow up with PharmD in 6 weeks  Labs ordered today: BMET - 1 months

## 2022-06-18 NOTE — Assessment & Plan Note (Signed)
Assessment: Patient with ASCVD not at LDL goal of < 70 Most recent LDL 160 on 05/14/22 Started rosuvastatin 40 mg daily just 3 days ago Unable to afford Nexlizet, not on insurance formulary  Plan: Patient to continue with rosuvastatin 40 mg daily Repeat labs after:  1 month Lipid Liver function Will look into Praluent at next visit, insurance lists at Tier 4

## 2022-06-20 ENCOUNTER — Ambulatory Visit (INDEPENDENT_AMBULATORY_CARE_PROVIDER_SITE_OTHER): Payer: 59 | Admitting: Orthopedic Surgery

## 2022-06-20 DIAGNOSIS — M48062 Spinal stenosis, lumbar region with neurogenic claudication: Secondary | ICD-10-CM

## 2022-06-20 NOTE — Progress Notes (Signed)
Orthopedic Spine Surgery Office Note   Assessment: Patient is a 57 y.o. male with low back pain that radiates into bilateral buttock and posterior thighs.  Has central and lateral recess stenosis at L4/5     Plan: -Explained that initially conservative treatment is tried as a significant number of patients may experience relief with these treatment modalities. Discussed that the conservative treatments include:             -activity modification             -physical therapy             -over the counter pain medications             -medrol dosepak             -lumbar steroid injections -Patient has tried meloxicam, cyclobenzaprine, tramadol, Medrol Dosepak, gabapentin, lumbar steroid injection -I talked about L4 and L5 segment laminectomies as a potential treatment option for him, but he was interested in trying PT.  Referral was provided to him today -Patient should return to office in 6 weeks, x-rays at next visit: none     Patient expressed understanding of the plan and all questions were answered to the patient's satisfaction.    ___________________________________________________________________________     History:   Patient is a 57 y.o. male who presents today for follow up on his lumbar spine.  Patient got good temporary relief from a L4/5 injection.  He said it only lasted though 4 to 5 days and then pain returned.  He is still feeling pain in his low back that radiates into the bilateral buttock and posterior thighs.  He feels the pain on a daily basis.  His pain improves when he sits down or flexes his lumbar spine but does not completely go away.  Treatments tried: Meloxicam, cyclobenzaprine, tramadol, Medrol Dosepak, gabapentin, lumbar steroid injection     Physical Exam:   General: no acute distress, appears stated age Neurologic: alert, answering questions appropriately, following commands Respiratory: unlabored breathing on room air, symmetric chest  rise Psychiatric: appropriate affect, normal cadence to speech     MSK (spine):   -Strength exam                                                   Left                  Right EHL                              5/5                  5/5 TA                                 5/5                  5/5 GSC                             5/5                  5/5 Knee extension  5/5                  5/5 Hip flexion                    5/5                  5/5   -Sensory exam                           Sensation intact to light touch in L3-S1 nerve distributions of bilateral lower extremities     -Straight leg raise: Negative bilaterally -Femoral nerve stretch test: Negative bilaterally -Clonus: no beats bilaterally   Imaging: XR of the lumbar spine from 03/21/2022 and 04/25/2022 was previously independently reviewed and interpreted, showing disc height loss at L5/S1. No fracture or dislocation seen. No evidence of instability on flexion/extension views.    MRI of the lumbar spine from 05/05/2022 was previously independently reviewed and interpreted, showing central and lateral recess stenosis at L4/5.  Bilateral foraminal stenosis at L3/4 and L4/5.    Patient name: Ethan Silva Patient MRN: [id number] Date of visit: 06/20/22

## 2022-07-02 DIAGNOSIS — Z419 Encounter for procedure for purposes other than remedying health state, unspecified: Secondary | ICD-10-CM | POA: Diagnosis not present

## 2022-07-10 IMAGING — DX DG CHEST 1V PORT
1 series · 1 of 1 positions shown · non-contrast
Comparison: July 13, 2020

CLINICAL DATA: Acute epigastric pain and cough.

EXAM:
PORTABLE CHEST 1 VIEW

[chest]
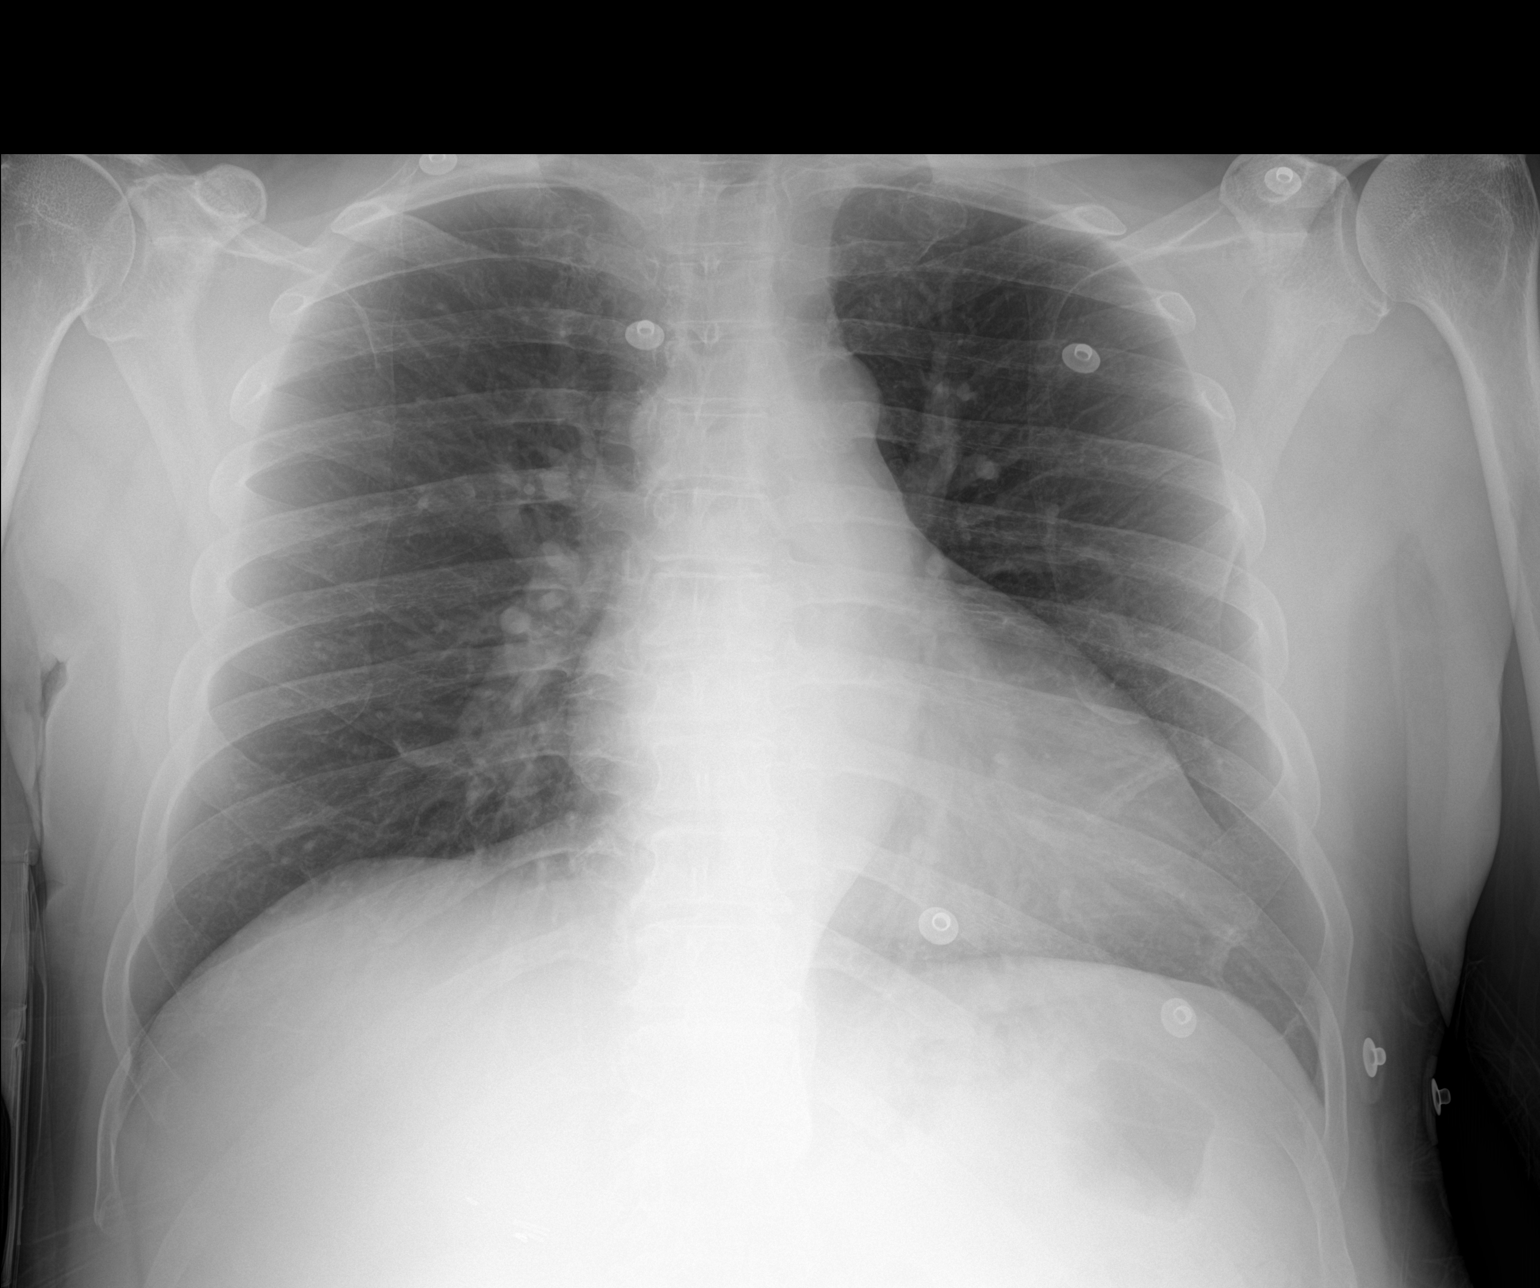

[1 of 1 positions shown; findings below may reference images not displayed]

FINDINGS: The heart size and mediastinal contours are within normal limits.
Both lungs are clear. The visualized skeletal structures are
unremarkable.
IMPRESSION: No active cardiopulmonary disease.

## 2022-07-11 ENCOUNTER — Ambulatory Visit: Payer: 59

## 2022-07-11 IMAGING — CT CT ABD-PELV W/ CM
2 of 5 series · 16 of 46 positions shown, 18 images · IV contrast (Omni 300)
Comparison: January 21, 2017

CLINICAL DATA: Upper abdominal pain with weakness and vomiting.

EXAM:
CT ABDOMEN AND PELVIS WITH CONTRAST
TECHNIQUE: Multidetector CT imaging of the abdomen and pelvis was performed
using the standard protocol following bolus administration of
intravenous contrast.
CONTRAST:  100mL OMNIPAQUE IOHEXOL 350 MG/ML SOLN

[Series 3: a/p w/ 5mm · axial · 0.69mm/px · z∈[-489,-69]mm · 13 of 94 slices shown, 15 images]
[im 5/94  soft-tissue]
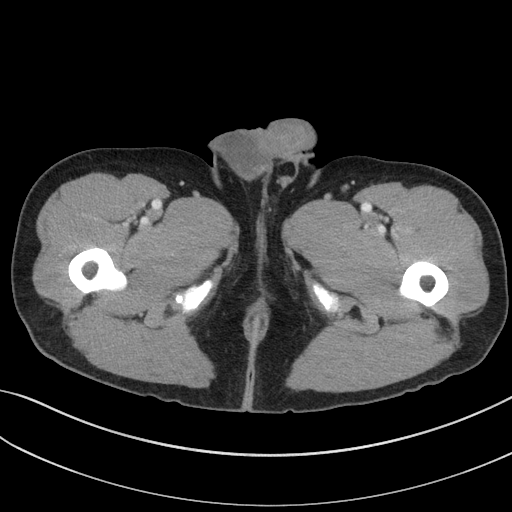
[im 5/94  bone]
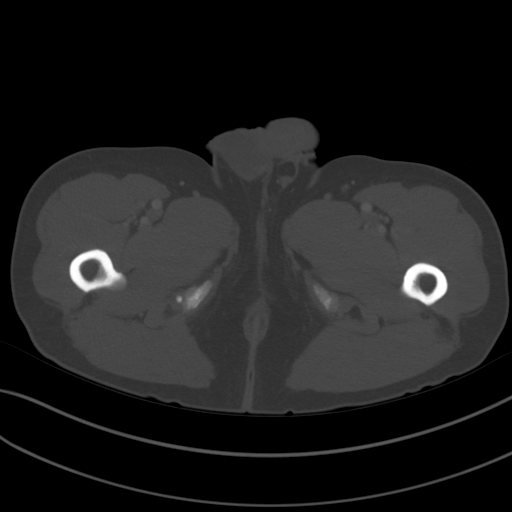
[im 14/94  soft-tissue]
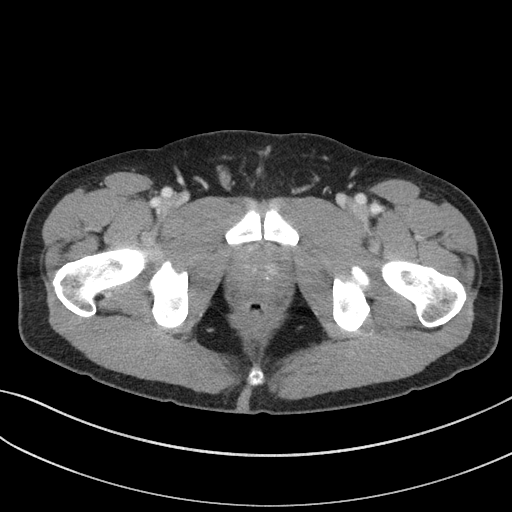
[im 19/94  soft-tissue]
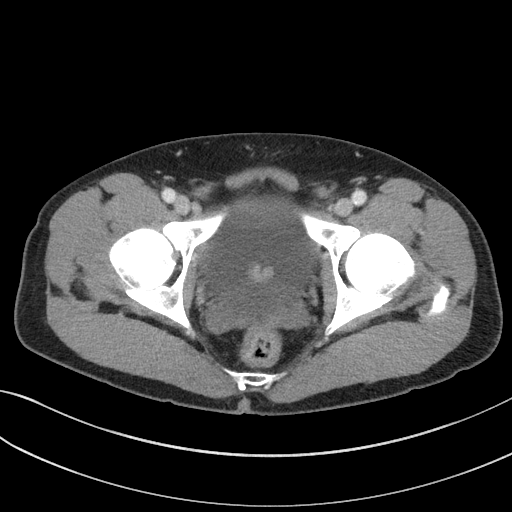
[im 28/94  soft-tissue]
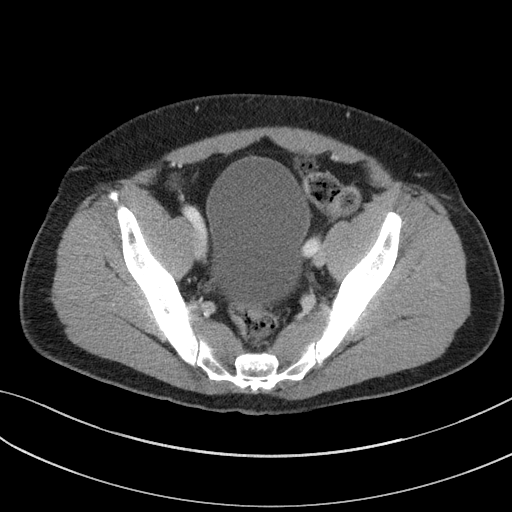
[im 33/94  soft-tissue]
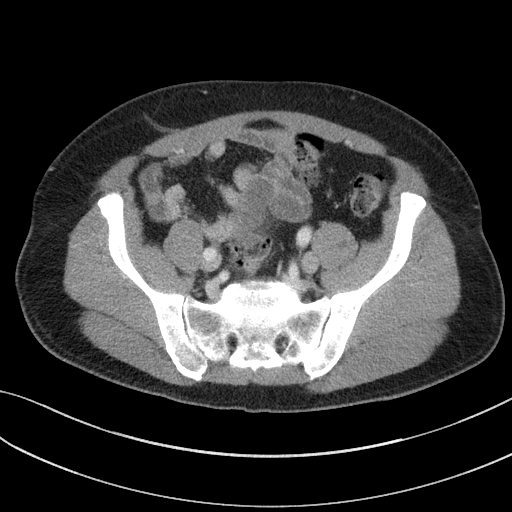
[im 42/94  soft-tissue]
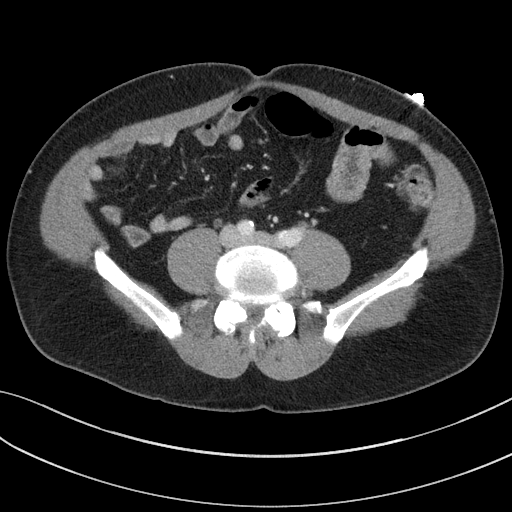
[im 47/94  soft-tissue]
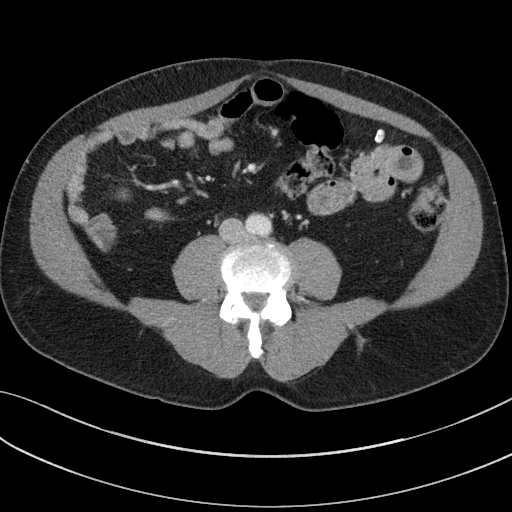
[im 52/94  soft-tissue]
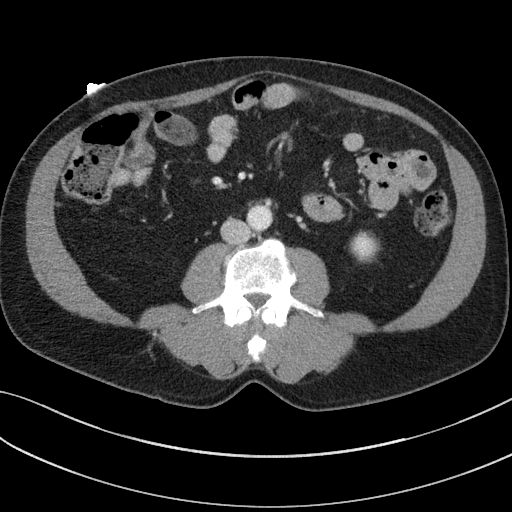
[im 61/94  soft-tissue]
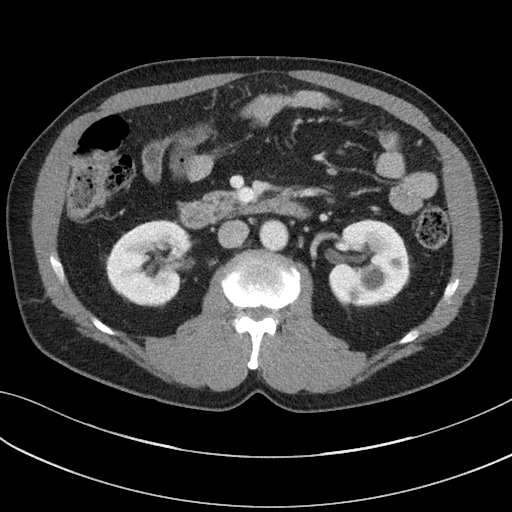
[im 61/94  bone]
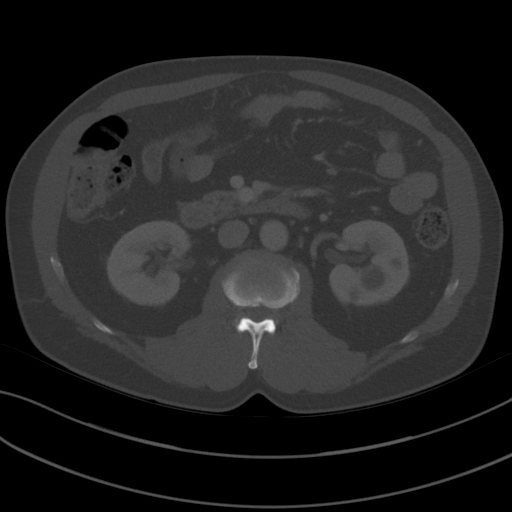
[im 66/94  soft-tissue]
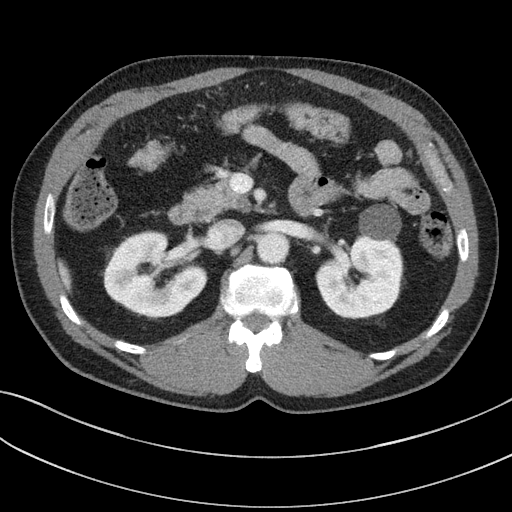
[im 75/94  soft-tissue]
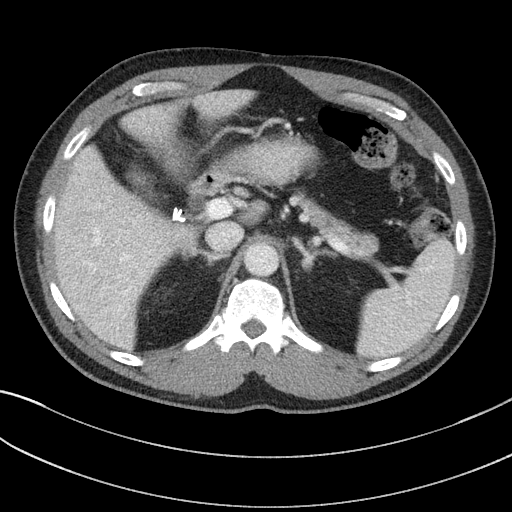
[im 80/94  soft-tissue]
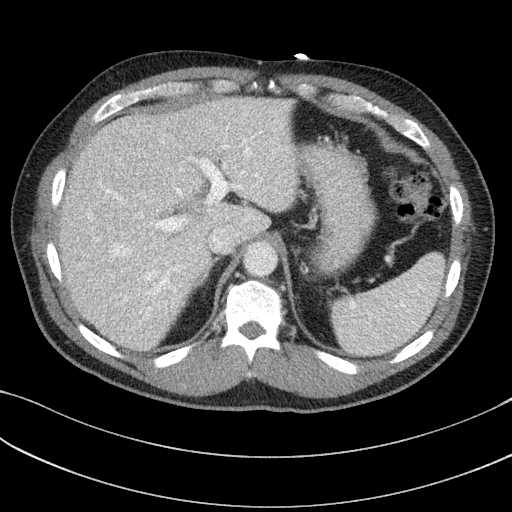
[im 89/94  soft-tissue]
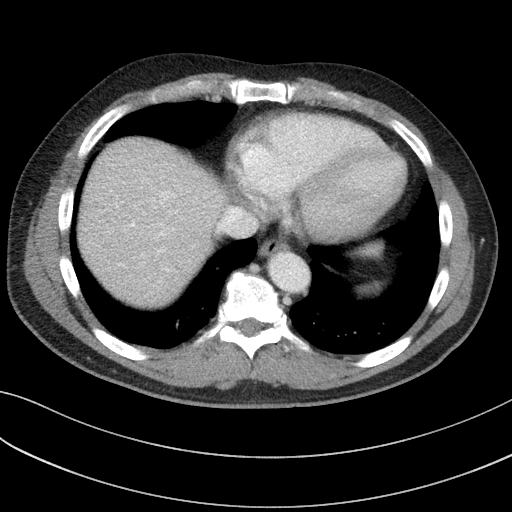

[Series 6: a/p w/ cor · coronal · 0.82mm/px · 3 of 155 slices shown]
[im 52/155  soft-tissue]
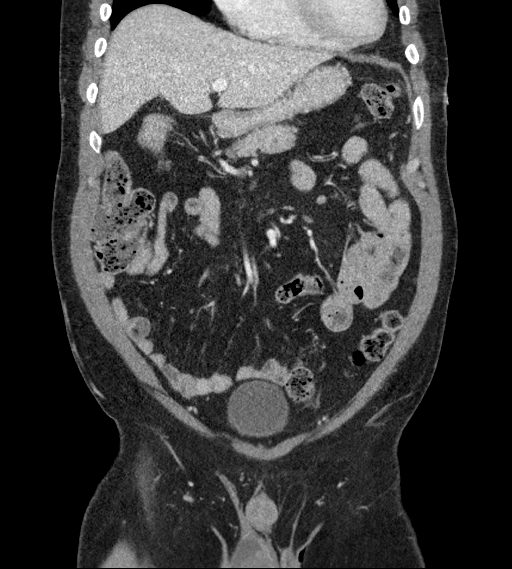
[im 69/155  soft-tissue]
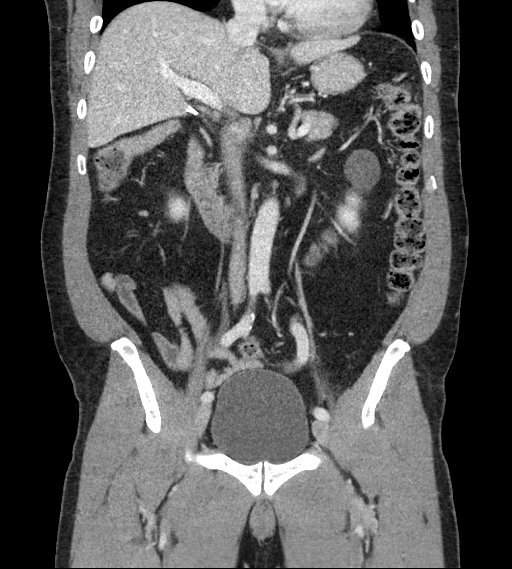
[im 86/155  soft-tissue]
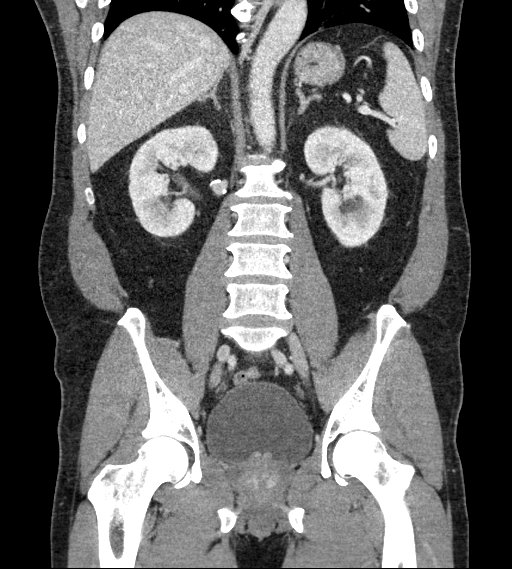

[16 of 46 positions shown; findings below may reference images not displayed]

FINDINGS: Lower chest: No acute abnormality.

Hepatobiliary: No focal liver abnormality is seen. Status post
cholecystectomy. No biliary dilatation.

Pancreas: Unremarkable. No pancreatic ductal dilatation or
surrounding inflammatory changes.

Spleen: Normal in size without focal abnormality.

Adrenals/Urinary Tract: Adrenal glands are unremarkable. Kidneys are
normal in size, without renal calculi or hydronephrosis. A 2.9 cm x
2.5 cm exophytic simple cyst is seen along the anterior aspect of
the mid left kidney. Multiple smaller cysts are noted within the mid
and lower left kidney. The urinary bladder is moderately distended
and otherwise normal in appearance.

Stomach/Bowel: Stomach is within normal limits. Appendix appears
normal. No evidence of bowel dilatation. The proximal to mid
transverse colon is mildly thickened and poorly distended.

Vascular/Lymphatic: Mild aortic atherosclerosis. A stable,
approximately 1.4 cm diameter partially calcified right renal artery
aneurysm is noted (axial CT image 32, CT series 3). No enlarged
abdominal or pelvic lymph nodes.

Reproductive: There is mild prostate gland enlargement.

Other: No abdominal wall hernia or abnormality. No abdominopelvic
ascites.

Musculoskeletal: No acute or significant osseous findings.
IMPRESSION: 1. Thickened and poorly distended transverse colon. Sequelae
associated with mild colitis can not be excluded.
2. Multiple predominant stable simple renal cysts.
3. Evidence of prior cholecystectomy.
4. Aortic atherosclerosis.

Aortic Atherosclerosis ([id number]).

## 2022-07-12 ENCOUNTER — Other Ambulatory Visit: Payer: Self-pay

## 2022-07-12 ENCOUNTER — Ambulatory Visit: Payer: 59 | Admitting: Physical Therapy

## 2022-07-12 ENCOUNTER — Encounter: Payer: Self-pay | Admitting: Physical Therapy

## 2022-07-12 DIAGNOSIS — M5459 Other low back pain: Secondary | ICD-10-CM

## 2022-07-12 NOTE — Therapy (Signed)
OUTPATIENT PHYSICAL THERAPY THORACOLUMBAR EVALUATION (NO CHARGE VISIT)   Patient Name: Ethan Silva MRN: [id number] DOB:11-04-1965, 57 y.o., male Today's Date: 07/12/2022  END OF SESSION:  PT End of Session - 07/12/22 1351     Visit Number 1   arrived no charge visit   Number of Visits --    Date for PT Re-Evaluation --    Authorization Type [place]    Authorization Time Period auth after 5th visit    Progress Note Due on Visit --    PT Start Time 1357    PT Stop Time 1436    PT Time Calculation (min) 39 min    Activity Tolerance Other (comment)   see assessment   Behavior During Therapy WFL for tasks assessed/performed             Past Medical History:  Diagnosis Date   Aortic stenosis, mild 07/2012   Very mild aortic stenosis noted on echo-mean gradient 11 mmHg.   Bile duct leak    CAD in native artery 07/14/2020   Small branch of OM 3 with 80% ostial stenosis.  Too small for PCI.  Otherwise questionable 30% LM and 30% PDA.   Chronic kidney disease 2017   left kidney stones   Headache(784.0)    Hx: of when BP is elevated   Hyperlipidemia    Hypertension    Non-STEMI (non-ST elevated myocardial infarction) (HCC) 07/13/2020   Admitted with hypertension and chest pain.  Cardiac cath showed small branch of an OM3 with 85% ostial stenosis not amenable to PCI.  No other significant disease noted.  Normal EF on echo.  Medical management.   Numbness and tingling in hands    Hx: of   Prediabetes    Past Surgical History:  Procedure Laterality Date   BILIARY DILATION  03/16/2021   Procedure: BILIARY DILATION;  Surgeon: Rachael Fee, MD;  Location: [facility];  Service: Endoscopy;;   CHOLECYSTECTOMY N/A 07/11/2012   Procedure: LAPAROSCOPIC CHOLECYSTECTOMY WITH INTRAOPERATIVE CHOLANGIOGRAM;  Surgeon: Axel Filler, MD;  Location: [facility] OR;  Service: General;  Laterality: N/A;   COLONOSCOPY     ENDOSCOPIC RETROGRADE CHOLANGIOPANCREATOGRAPHY (ERCP)  WITH PROPOFOL N/A 03/16/2021   Procedure: ENDOSCOPIC RETROGRADE CHOLANGIOPANCREATOGRAPHY (ERCP) WITH PROPOFOL;  Surgeon: Rachael Fee, MD;  Location: [facility];  Service: Endoscopy;  Laterality: N/A;   ERCP  07/16/2012   ERCP N/A 07/16/2012   Procedure: ENDOSCOPIC RETROGRADE CHOLANGIOPANCREATOGRAPHY (ERCP);  Surgeon: Louis Meckel, MD;  Location: [facility] OR;  Service: Gastroenterology;  Laterality: N/A;   ERCP N/A 10/06/2012   Procedure: ENDOSCOPIC RETROGRADE CHOLANGIOPANCREATOGRAPHY (ERCP);  Surgeon: Louis Meckel, MD;  Location: [facility];  Service: Endoscopy;  Laterality: N/A;   ESOPHAGOGASTRODUODENOSCOPY N/A 03/16/2021   Procedure: ESOPHAGOGASTRODUODENOSCOPY (EGD);  Surgeon: Rachael Fee, MD;  Location: [facility];  Service: Endoscopy;  Laterality: N/A;   EUS N/A 03/16/2021   Procedure: UPPER ENDOSCOPIC ULTRASOUND (EUS) RADIAL;  Surgeon: Rachael Fee, MD;  Location: [facility];  Service: Endoscopy;  Laterality: N/A;   HAND SURGERY     LEFT HEART CATH AND CORONARY ANGIOGRAPHY N/A 07/14/2020   Procedure: LEFT HEART CATH AND CORONARY ANGIOGRAPHY;  Surgeon: Lyn Records, MD;  Location: [facility];  Service: Cardiovascular; ? Culprit Lesion ~ 80% ostial small OM3 (too small & ostial for PCI). ~30% mid LM (at a bend).  LAD with D1 & D2 - normal, RCA minimal luminal irregularities ~ 30%. PDA   SPHINCTEROTOMY  03/16/2021  Procedure: SPHINCTEROTOMY;  Surgeon: Rachael Fee, MD;  Location: [facility];  Service: Endoscopy;;   TRANSTHORACIC ECHOCARDIOGRAM  07/14/2020   (NSTEMI/Accelerated Hypertension): EF 55 to 60%.  No RWM A.  Very mild Aortic Stenosis-mean gradient 11 to mmHg.   Patient Active Problem List   Diagnosis Date Noted   Mild aortic stenosis by prior echocardiogram 05/14/2022   Back pain of lumbar region with sciatica 04/16/2022   Degenerative disc disease, lumbar 04/16/2022   Lumbosacral pain 03/27/2022   Lumbosacral strain 03/21/2022   Musculoskeletal  pain 03/21/2022   Chronic foot pain, right 10/30/2021   Calcaneal spur of right foot 10/30/2021   Aortic atherosclerosis (HCC) 04/25/2021   Coronary artery disease involving native coronary artery of native heart with angina pectoris (HCC) 07/15/2020   Prediabetes 07/15/2020   BMI 30.0-30.9,adult 08/30/2015   Hyperlipidemia with target LDL less than 70 08/30/2015   Essential hypertension 10/11/2012    PCP: Georgina Quint, MD  REFERRING PROVIDER: London Sheer, MD  REFERRING DIAG: [phone] (ICD-10-CM) - Lumbar stenosis with neurogenic claudication  Rationale for Evaluation and Treatment: Rehabilitation  THERAPY DIAG:  Other low back pain  ONSET DATE: chronic per pt report, worsening a few months ago  SUBJECTIVE:                                                                                                                                                                                           SUBJECTIVE STATEMENT: Appreciate assistance of [employer] video interpreter services. Pt states he has had back pain and referral into BLE, R>L. MRI performed in May. States pain has been going on for quite some time, worsening over past few months. Gradually worsens with repeated activity and improves with rest. Symptoms tend to fluctuate. Difficulty with yardwork. Cannot recall any precipitating factors for this exacerbation. States he was told he would likely benefit from surgery but he wanted to try therapy first.   PERTINENT HISTORY:  HTN, CAD + angina, prediabetes  PAIN:  Are you having pain: yes, 3/10 Location/description: low back, occurs in both LE but usually one more than the other Best-worst over past week: 0-5/10  - aggravating factors: bending forward, walking, standing, work activities, sitting for a while - Easing factors: rest, injection  PRECAUTIONS: cardiac history  WEIGHT BEARING RESTRICTIONS: none  FALLS:  Has patient fallen in last 6 months? 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 Transitional anatomy.  Please see detailed description above. 2. 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 3. Mild spinal canal stenosis at L2-3, L3-4 and L4-5. 4. Mild to moderate bilateral neural foraminal narrowing at L3-4. 5. Moderate bilateral neural foraminal narrowing at L4-5. 6. Accessory piriform muscle bilaterally."  PATIENT SURVEYS:  FOTO deferred today  SCREENING FOR RED FLAGS: Red flag questioning/screening reassuring, MRI as above  COGNITION: Overall cognitive status: Within functional limits for tasks assessed     SENSATION: NT  MUSCLE LENGTH: NT  POSTURE: guarded trunk posture with elevated upper traps bilaterally  PALPATION: Deferred  LUMBAR ROM:   AROM eval  Flexion   Extension   Right lateral flexion   Left lateral flexion   Right rotation   Left rotation    (Blank rows = not tested) Comments: NT on 07/12/22, see assessment  LOWER EXTREMITY ROM:     Active  Right eval Left eval  Hip flexion    Hip extension    Hip internal rotation    Hip external rotation    Knee extension    Knee flexion    (Blank rows = not tested) (Key: WFL = within functional limits not formally assessed, * = concordant pain, s = stiffness/stretching sensation, NT = not tested)  Comments: NT on 07/12/22, see assessment  LOWER EXTREMITY MMT:    MMT Right eval Left eval  Hip flexion    Hip abduction (modified sitting)    Hip internal  rotation    Hip external rotation    Knee flexion    Knee extension    Ankle dorsiflexion     (Blank rows = not tested) (Key: WFL = within functional limits not formally assessed, * = concordant pain, s = stiffness/stretching sensation, NT = not tested)  Comments: NT on 07/12/22, see assessment  LUMBAR SPECIAL TESTS:  deferred  FUNCTIONAL TESTS:  deferred  GAIT: Distance walked: within clinic Assistive device utilized: None Level of assistance: Complete Independence Comments: deferring formal assessment 07/12/22   Vitals 07/12/22: HR 68-70, SpO2 94-95%, BP L arm 163/103.  Pt requests vitals check as he states he feels his BP is elevated (states he typically feels a little "pressure" in his head when BP is high). Denies any other adverse symptoms (SOB, chest pain/pressure, UE symptoms, dizziness, etc). States his BP was 130s systolic and 90s diastolic earlier this morning   TODAY'S TREATMENT:                                                                                                                              [facility] Adult PT Treatment:  DATE: 07/12/22 deferred  PATIENT EDUCATION:  Education details: monitoring symptoms/vitals, communication w/ provider, red flags and appropriate response Person educated: Patient Education method: Explanation, Demonstration, Tactile cues, Verbal cues Education comprehension: verbalized understanding, returned demonstration, verbal cues required, tactile cues required, and needs further education    HOME EXERCISE PROGRAM: deferred  ASSESSMENT:  CLINICAL IMPRESSION: Pt is a 57 year old gentleman who arrives to PT evaluation on this date for low back pain. After subjective portion of evaluation, pt requests vitals check - he denies any overt symptoms but states he thinks his blood pressure feels high (see vitals section above). Vitals as above, outside his reported norm from earlier this morning. In  discussion with pt, he states he would like to defer further examination at this time and reschedule, which is respected. He continues to deny any overt adverse symptoms, education is provided on monitoring symptoms/vitals, communicating with provider, and monitoring for red flag symptoms with appropriate response (EMS/ED). Pt verbalizes agreement/understanding, departs session in no acute distress.   OBJECTIVE IMPAIRMENTS: not assessed today  ACTIVITY LIMITATIONS: not assessed today  PARTICIPATION LIMITATIONS: not assessed today  PERSONAL FACTORS: Time since onset of injury/illness/exacerbation and 3+ comorbidities: HTN, CAD, prediabetes  are also affecting patient's functional outcome.   REHAB POTENTIAL: not assessed today  CLINICAL DECISION MAKING: not assessed today  EVALUATION COMPLEXITY: not assessed today   GOALS: Goals reviewed with patient? No Will defer goals pending full pt assessment.    PLAN:  PT FREQUENCY: deferred  PT DURATION: deferred  PLANNED INTERVENTIONS: deferred  PLAN FOR NEXT SESSION: Continue examination/assessment as appropriate. Monitor vitals/symptoms.    Ashley Murrain PT, DPT 07/12/2022 3:02 PM

## 2022-07-14 IMAGING — CT CT ABD-PELV W/ CM
2 of 5 series · 16 of 46 positions shown, 18 images · IV contrast (omnipaque)
Comparison: August 27, 2020

CLINICAL DATA: Constipation.

EXAM:
CT ABDOMEN AND PELVIS WITH CONTRAST
TECHNIQUE: Multidetector CT imaging of the abdomen and pelvis was performed
using the standard protocol following bolus administration of
intravenous contrast.
CONTRAST:  100mL OMNIPAQUE IOHEXOL 350 MG/ML SOLN

[Series 3: abd/ pelvis 5.0 i30f 2 · axial · 0.79mm/px · z∈[+747,+1182]mm · 13 of 99 slices shown, 15 images]
[im 6/99  soft-tissue]
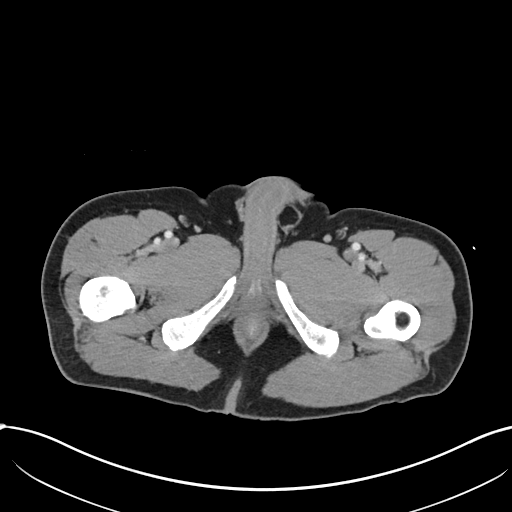
[im 6/99  bone]
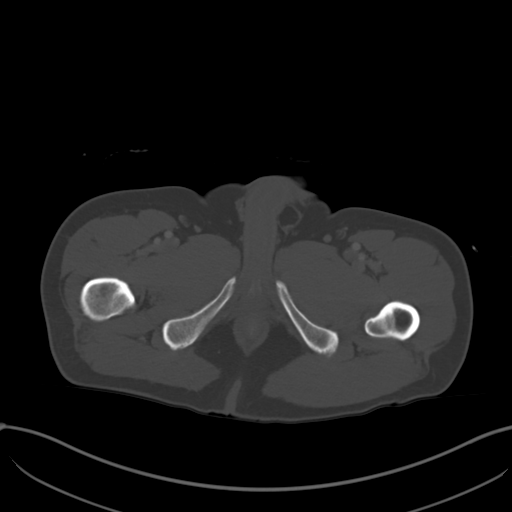
[im 16/99  soft-tissue]
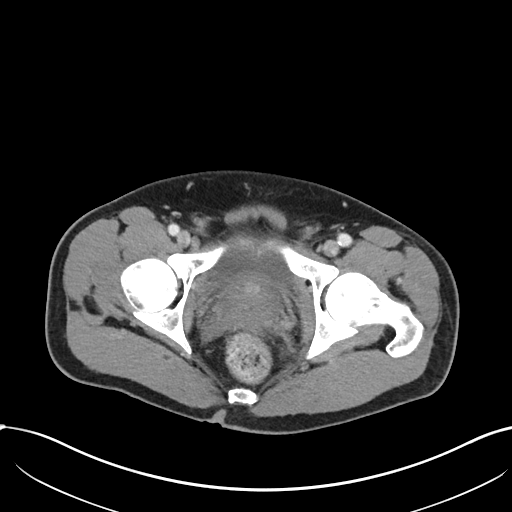
[im 21/99  soft-tissue]
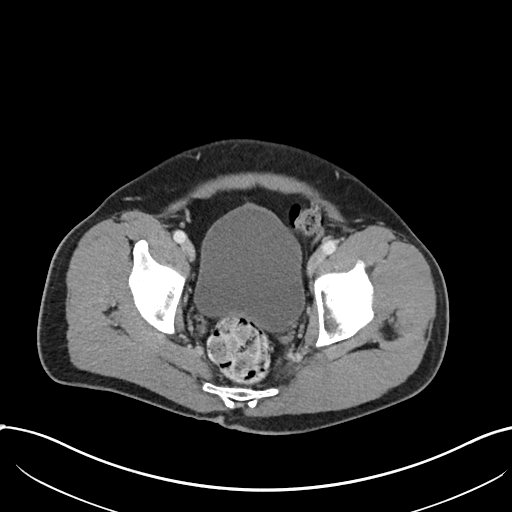
[im 26/99  soft-tissue]
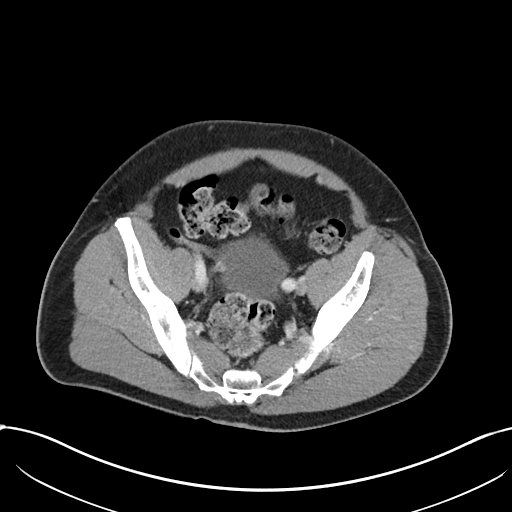
[im 37/99  soft-tissue]
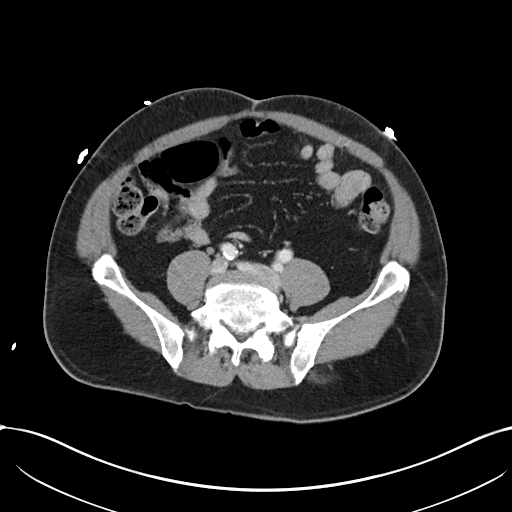
[im 42/99  soft-tissue]
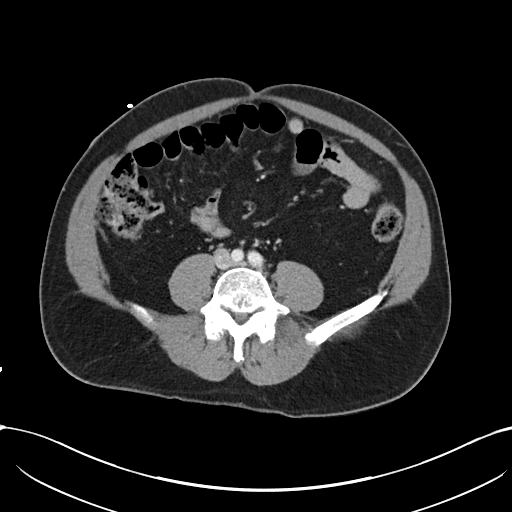
[im 52/99  soft-tissue]
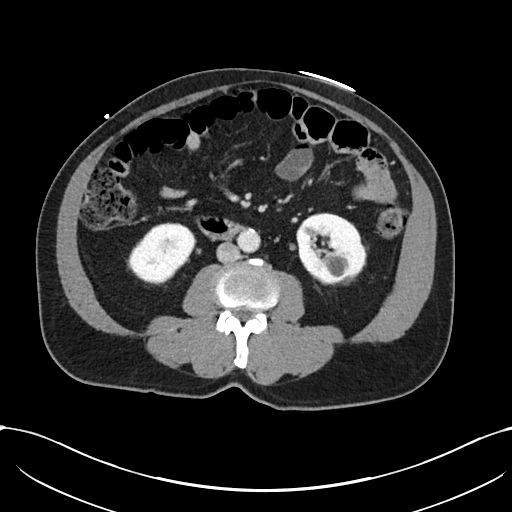
[im 57/99  soft-tissue]
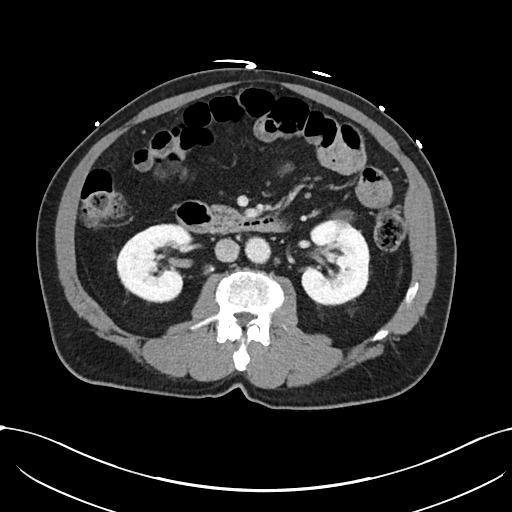
[im 62/99  soft-tissue]
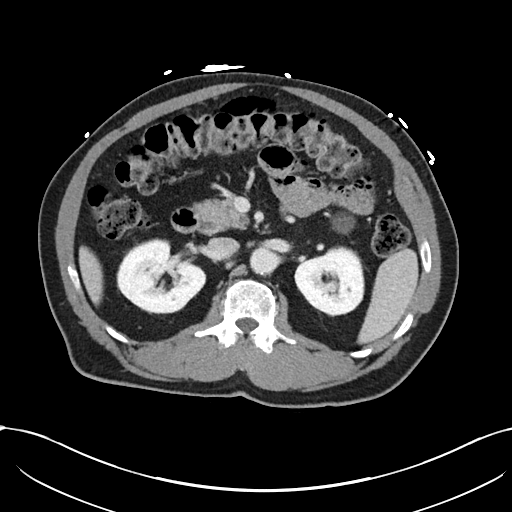
[im 62/99  bone]
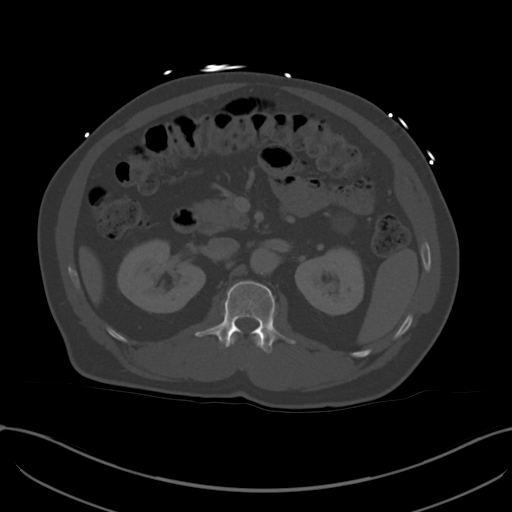
[im 73/99  soft-tissue]
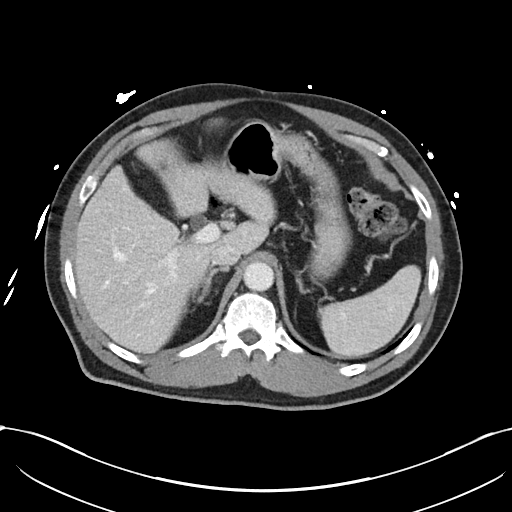
[im 78/99  soft-tissue]
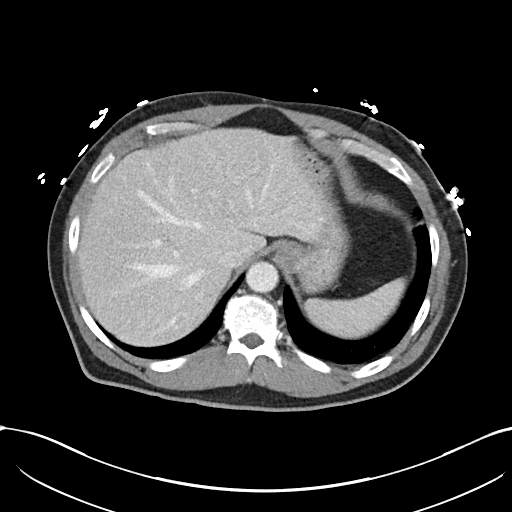
[im 83/99  soft-tissue]
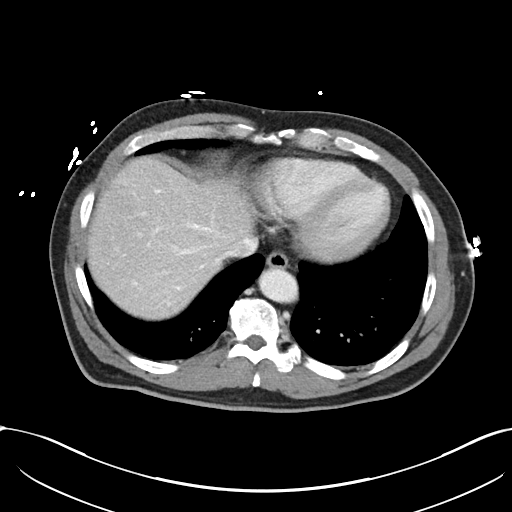
[im 93/99  soft-tissue]
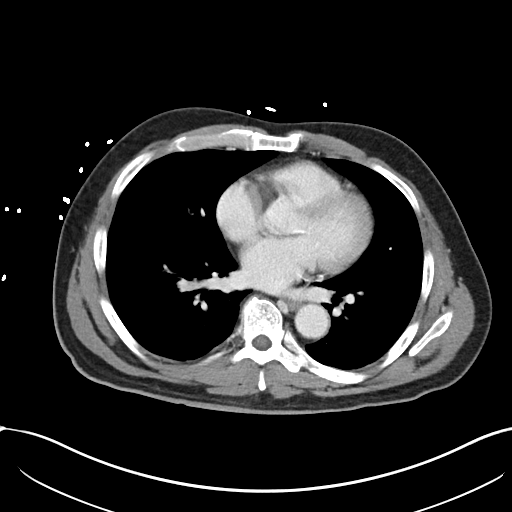

[Series 6: coronal soft tissue · coronal · 0.77mm/px · 3 of 105 slices shown]
[im 35/105  soft-tissue]
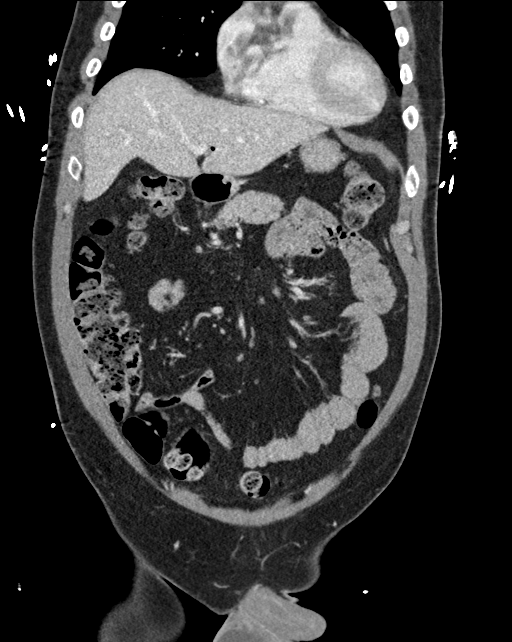
[im 47/105  soft-tissue]
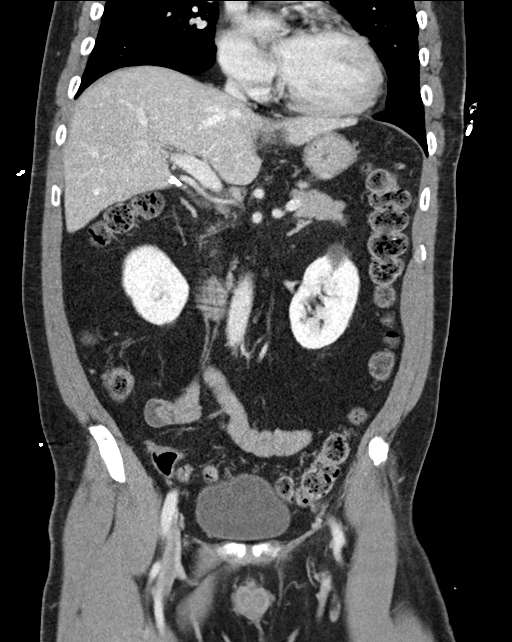
[im 58/105  soft-tissue]
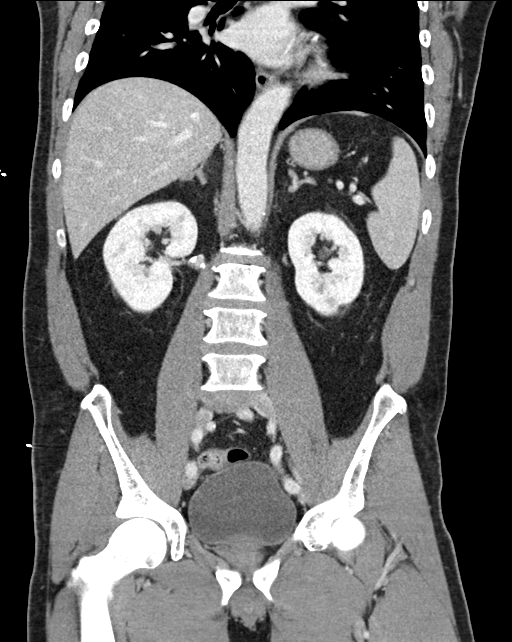

[16 of 46 positions shown; findings below may reference images not displayed]

FINDINGS: Lower chest: No acute abnormality.

Hepatobiliary: No focal liver abnormality is seen. Status post
cholecystectomy. No biliary dilatation.

Pancreas: Unremarkable. No pancreatic ductal dilatation or
surrounding inflammatory changes.

Spleen: Normal in size without focal abnormality.

Adrenals/Urinary Tract: Adrenal glands are unremarkable. Kidneys are
normal in size, without renal calculi or hydronephrosis. A 2.8 cm
simple cyst is seen within the anterior aspect of the mid left
kidney. Additional smaller simple cysts are seen within the mid and
lower left kidney. Bladder is unremarkable.

Stomach/Bowel: Stomach is within normal limits. Appendix appears
normal. Stool is seen throughout the large bowel. No evidence of
bowel wall thickening, distention, or inflammatory changes.

Vascular/Lymphatic: A stable partially calcified right renal artery
aneurysm is seen. No additional significant vascular findings are
present. No enlarged abdominal or pelvic lymph nodes.

Reproductive: Prostate gland is moderately enlarged.

Other: No abdominal wall hernia or abnormality. No abdominopelvic
ascites.

Musculoskeletal: No acute or significant osseous findings.
IMPRESSION: 1. Evidence of prior cholecystectomy.
2. Large stool burden without evidence of bowel obstruction.

## 2022-07-17 ENCOUNTER — Encounter (HOSPITAL_BASED_OUTPATIENT_CLINIC_OR_DEPARTMENT_OTHER): Payer: Self-pay

## 2022-07-17 ENCOUNTER — Other Ambulatory Visit: Payer: Self-pay

## 2022-07-17 ENCOUNTER — Emergency Department (HOSPITAL_BASED_OUTPATIENT_CLINIC_OR_DEPARTMENT_OTHER): Payer: 59 | Admitting: Radiology

## 2022-07-17 ENCOUNTER — Emergency Department (HOSPITAL_BASED_OUTPATIENT_CLINIC_OR_DEPARTMENT_OTHER)
Admission: EM | Admit: 2022-07-17 | Discharge: 2022-07-18 | Disposition: A | Discharge status: Home / Self Care | Payer: 59 | Attending: Emergency Medicine | Admitting: Emergency Medicine

## 2022-07-17 DIAGNOSIS — S99922A Unspecified injury of left foot, initial encounter: Secondary | ICD-10-CM | POA: Insufficient documentation

## 2022-07-17 DIAGNOSIS — E860 Dehydration: Secondary | ICD-10-CM | POA: Insufficient documentation

## 2022-07-17 DIAGNOSIS — Z7982 Long term (current) use of aspirin: Secondary | ICD-10-CM | POA: Diagnosis not present

## 2022-07-17 DIAGNOSIS — W450XXA Nail entering through skin, initial encounter: Secondary | ICD-10-CM | POA: Diagnosis not present

## 2022-07-17 DIAGNOSIS — Z79899 Other long term (current) drug therapy: Secondary | ICD-10-CM | POA: Insufficient documentation

## 2022-07-17 DIAGNOSIS — Z23 Encounter for immunization: Secondary | ICD-10-CM | POA: Insufficient documentation

## 2022-07-17 DIAGNOSIS — R319 Hematuria, unspecified: Secondary | ICD-10-CM | POA: Insufficient documentation

## 2022-07-17 LAB — CBC WITH DIFFERENTIAL/PLATELET
Abs Immature Granulocytes: 0.02 10*3/uL (ref 0.00–0.07)
Basophils Absolute: 0 10*3/uL (ref 0.0–0.1)
Basophils Relative: 1 %
Eosinophils Absolute: 0.1 10*3/uL (ref 0.0–0.5)
Eosinophils Relative: 2 %
HCT: 44.2 % (ref 39.0–52.0)
Hemoglobin: 15.3 g/dL (ref 13.0–17.0)
Immature Granulocytes: 0 %
Lymphocytes Relative: 34 %
Lymphs Abs: 2.6 10*3/uL (ref 0.7–4.0)
MCH: 30.2 pg (ref 26.0–34.0)
MCHC: 34.6 g/dL (ref 30.0–36.0)
MCV: 87.2 fL (ref 80.0–100.0)
Monocytes Absolute: 0.6 10*3/uL (ref 0.1–1.0)
Monocytes Relative: 8 %
Neutro Abs: 4.2 10*3/uL (ref 1.7–7.7)
Neutrophils Relative %: 55 %
Platelets: 194 10*3/uL (ref 150–400)
RBC: 5.07 MIL/uL (ref 4.22–5.81)
RDW: 13.7 % (ref 11.5–15.5)
WBC: 7.5 10*3/uL (ref 4.0–10.5)
nRBC: 0 % (ref 0.0–0.2)

## 2022-07-17 LAB — URINALYSIS, ROUTINE W REFLEX MICROSCOPIC
Bacteria, UA: NONE SEEN
Bilirubin Urine: NEGATIVE
Glucose, UA: NEGATIVE mg/dL
Ketones, ur: NEGATIVE mg/dL
Leukocytes,Ua: NEGATIVE
Nitrite: NEGATIVE
Specific Gravity, Urine: 1.027 (ref 1.005–1.030)
pH: 5.5 (ref 5.0–8.0)

## 2022-07-17 LAB — BASIC METABOLIC PANEL
Anion gap: 9 (ref 5–15)
BUN: 23 mg/dL — ABNORMAL HIGH (ref 6–20)
CO2: 26 mmol/L (ref 22–32)
Calcium: 9 mg/dL (ref 8.9–10.3)
Chloride: 108 mmol/L (ref 98–111)
Creatinine, Ser: 0.99 mg/dL (ref 0.61–1.24)
GFR, Estimated: >60 mL/min (ref >60)
Glucose, Bld: 104 mg/dL — ABNORMAL HIGH (ref 70–99)
Potassium: 3.9 mmol/L (ref 3.5–5.1)
Sodium: 143 mmol/L (ref 135–145)

## 2022-07-17 MED ORDER — CIPROFLOXACIN HCL 500 MG PO TABS
500.0000 mg | ORAL_TABLET | Freq: Once | ORAL | Status: AC
  Administered 2022-07-17: 500 mg via ORAL
  Filled 2022-07-17: qty 1

## 2022-07-17 MED ORDER — OXYCODONE-ACETAMINOPHEN 5-325 MG PO TABS
2.0000 | ORAL_TABLET | Freq: Once | ORAL | Status: AC
  Administered 2022-07-17: 2 via ORAL
  Filled 2022-07-17: qty 2

## 2022-07-17 MED ORDER — CIPROFLOXACIN HCL 500 MG PO TABS: 500.0000 mg | ORAL_TABLET | Freq: Two times a day (BID) | ORAL | 0 refills | Status: DC

## 2022-07-17 MED ORDER — TETANUS-DIPHTH-ACELL PERTUSSIS 5-2.5-18.5 LF-MCG/0.5 IM SUSY
0.5000 mL | PREFILLED_SYRINGE | Freq: Once | INTRAMUSCULAR | Status: AC
  Administered 2022-07-18: 0.5 mL via INTRAMUSCULAR
  Filled 2022-07-17: qty 0.5

## 2022-07-17 NOTE — ED Triage Notes (Addendum)
Patient here POV from Home.  Endorses stepping on a nail 30-60 minutes ago. Left Foot Affected. Was bleeding but no longer. Tetanus is not UTD. Also notes some burning with urination for 3 Days.   NAD Noted during Triage. A&Ox4. Gcs 15. Ambulatory.

## 2022-07-17 NOTE — ED Provider Notes (Signed)
[place] EMERGENCY DEPARTMENT AT [facility] Provider Note   CSN: [id number] Arrival date & time: 07/17/22  1928     History {Add pertinent medical, surgical, social history, OB history to HPI:1} Chief Complaint  Patient presents with   Foot Injury    Ethan Silva is a 57 y.o. male.  Stepped on a nail just prior to arrival. Hurts around left fourth MTP. Unknown tetanus. Was wearing shoes.   Also with three days of dysuria. Has had episodes previously. Only burns at end of urination for a few seconds. Has not seen anyone for the blood in his urine.    Foot Injury      Home Medications Prior to Admission medications   Medication Sig Start Date End Date Taking? Authorizing Provider  acetaminophen (TYLENOL) 325 MG tablet Take 325 mg by mouth every 6 (six) hours as needed for moderate pain.    [provider]  amLODipine (NORVASC) 10 MG tablet Take 1 tablet (10 mg total) by mouth daily. 05/14/22 08/12/22  Marykay Lex, MD  aspirin 81 MG EC tablet Take 1 tablet (81 mg total) by mouth daily. Swallow whole. 07/15/20   O'Neal, Ronnald Ramp, MD  gabapentin (NEURONTIN) 300 MG capsule Take 1 capsule (300 mg total) by mouth 3 (three) times daily. 04/25/22 05/25/22  London Sheer, MD  lisinopril (ZESTRIL) 40 MG tablet Take 1 tablet (40 mg total) by mouth daily. 06/18/22 09/16/22  Marykay Lex, MD  nitroGLYCERIN (NITROSTAT) 0.4 MG SL tablet Place 1 tablet (0.4 mg total) under the tongue every 5 (five) minutes x 3 doses as needed for chest pain. 11/13/21   Marykay Lex, MD  pantoprazole (PROTONIX) 40 MG tablet Take 1 tablet by mouth once daily 05/14/22   Marykay Lex, MD  polyethylene glycol powder [facility]) 17 GM/SCOOP powder Take 17 g by mouth 2 (two) times daily as needed. 10/11/20   Corwin Levins, MD  rosuvastatin (CRESTOR) 40 MG tablet Take 1 tablet (40 mg total) by mouth daily. 10/30/21 10/25/22  Georgina Quint, MD       Allergies    Patient has no known allergies.    Review of Systems   Review of Systems  Physical Exam Updated Vital Signs BP (!) 155/97   Pulse (!) 57   Temp 98.2 F (36.8 C) (Oral)   Resp 18   Ht 5\' 3"  (1.6 m)   Wt 78.5 kg   SpO2 96%   BMI 30.65 kg/m  Physical Exam Vitals and nursing note reviewed.  Constitutional:      Appearance: He is well-developed.  HENT:     Head: Normocephalic and atraumatic.  Cardiovascular:     Rate and Rhythm: Normal rate.  Pulmonary:     Effort: Pulmonary effort is normal. No respiratory distress.  Abdominal:     General: There is no distension.  Musculoskeletal:        General: Normal range of motion.     Cervical back: Normal range of motion.  Skin:    General: Skin is warm and dry.     Comments: Small puncture wound around the fourth MTP of left foot, no bleeding or swelling or drainage  Neurological:     Mental Status: He is alert.     ED Results / Procedures / Treatments   Labs (all labs ordered are listed, but only abnormal results are displayed) Labs Reviewed  URINALYSIS, ROUTINE W REFLEX MICROSCOPIC - Abnormal; Notable for the following components:  Result Value   Hgb urine dipstick MODERATE (*)    Protein, ur TRACE (*)    All other components within normal limits  CBC WITH DIFFERENTIAL/PLATELET  BASIC METABOLIC PANEL    EKG None  Radiology DG Foot Complete Left  Result Date: 07/17/2022 CLINICAL DATA:  Stepped on a nail.  Possible foreign body EXAM: LEFT FOOT - COMPLETE 3+ VIEW COMPARISON:  None Available. FINDINGS: No acute fracture or dislocation. Soft tissues are radiographically unremarkable. No radiopaque foreign body. IMPRESSION: No acute osseous abnormality.  No radiopaque foreign body Electronically Signed   By: Minerva Fester M.D.   On: 07/17/2022 21:06    Procedures Procedures  {Document cardiac monitor, telemetry assessment procedure when appropriate:1}  Medications Ordered in ED Medications -  No data to display  ED Course/ Medical Decision Making/ A&P   {   Click here for ABCD2, HEART and other calculatorsREFRESH Note before signing :1}                          Medical Decision Making Amount and/or Complexity of Data Reviewed Labs: ordered.   ***  {Document critical care time when appropriate:1} {Document review of labs and clinical decision tools ie heart score, Chads2Vasc2 etc:1}  {Document your independent review of radiology images, and any outside records:1} {Document your discussion with family members, caretakers, and with consultants:1} {Document social determinants of health affecting pt's care:1} {Document your decision making why or why not admission, treatments were needed:1} Final Clinical Impression(s) / ED Diagnoses Final diagnoses:  None    Rx / DC Orders ED Discharge Orders     None

## 2022-07-18 DIAGNOSIS — S99922A Unspecified injury of left foot, initial encounter: Secondary | ICD-10-CM | POA: Diagnosis not present

## 2022-07-19 ENCOUNTER — Ambulatory Visit: Payer: 59 | Attending: Physical Therapy | Admitting: Physical Therapy

## 2022-07-19 ENCOUNTER — Other Ambulatory Visit: Payer: Self-pay

## 2022-07-19 DIAGNOSIS — M5459 Other low back pain: Secondary | ICD-10-CM | POA: Insufficient documentation

## 2022-07-19 DIAGNOSIS — R2689 Other abnormalities of gait and mobility: Secondary | ICD-10-CM | POA: Insufficient documentation

## 2022-07-19 DIAGNOSIS — E785 Hyperlipidemia, unspecified: Secondary | ICD-10-CM

## 2022-07-19 DIAGNOSIS — M6281 Muscle weakness (generalized): Secondary | ICD-10-CM | POA: Insufficient documentation

## 2022-07-20 LAB — COMPREHENSIVE METABOLIC PANEL
ALT: 28 IU/L (ref 0–44)
AST: 24 IU/L (ref 0–40)
Albumin: 4.6 g/dL (ref 3.8–4.9)
Alkaline Phosphatase: 51 IU/L (ref 44–121)
BUN/Creatinine Ratio: 18 (ref 9–20)
BUN: 17 mg/dL (ref 6–24)
Bilirubin Total: 0.5 mg/dL (ref 0.0–1.2)
CO2: 24 mmol/L (ref 20–29)
Calcium: 9.3 mg/dL (ref 8.7–10.2)
Chloride: 103 mmol/L (ref 96–106)
Creatinine, Ser: 0.92 mg/dL (ref 0.76–1.27)
Globulin, Total: 2.4 g/dL (ref 1.5–4.5)
Glucose: 97 mg/dL (ref 70–99)
Potassium: 4.1 mmol/L (ref 3.5–5.2)
Sodium: 140 mmol/L (ref 134–144)
Total Protein: 7 g/dL (ref 6.0–8.5)
eGFR: 96 mL/min/{1.73_m2} (ref >59)

## 2022-07-20 LAB — LIPID PANEL
Chol/HDL Ratio: 3.8 ratio (ref 0.0–5.0)
Cholesterol, Total: 156 mg/dL (ref 100–199)
HDL: 41 mg/dL (ref >39)
LDL Chol Calc (NIH): 94 mg/dL (ref 0–99)
Triglycerides: 113 mg/dL (ref 0–149)
VLDL Cholesterol Cal: 21 mg/dL (ref 5–40)

## 2022-07-25 ENCOUNTER — Ambulatory Visit (INDEPENDENT_AMBULATORY_CARE_PROVIDER_SITE_OTHER): Payer: 59 | Admitting: Orthopedic Surgery

## 2022-07-25 DIAGNOSIS — M48062 Spinal stenosis, lumbar region with neurogenic claudication: Secondary | ICD-10-CM

## 2022-07-25 NOTE — Progress Notes (Signed)
Orthopedic Spine Surgery Office Note   Assessment: Patient is a 57 y.o. male with low back pain that radiates into bilateral buttock and posterior thighs.  Has central and lateral recess stenosis at L4/5     Plan: -Explained that initially conservative treatment is tried as a significant number of patients may experience relief with these treatment modalities. Discussed that the conservative treatments include:             -activity modification             -physical therapy             -over the counter pain medications             -medrol dosepak             -lumbar steroid injections -Patient has tried meloxicam, cyclobenzaprine, tramadol, Medrol Dosepak, gabapentin, lumbar steroid injection -I talked about L4 and L5 segment laminectomies with partial medical facetectomies and foraminotomies as a treatment option for him. He has not been able to get in to therapy yet so we are still going to try that treatment first -Patient should return to office in 6 weeks, x-rays at next visit: none     Patient expressed understanding of the plan and all questions were answered to the patient's satisfaction.    ___________________________________________________________________________     History:   Patient is a 57 y.o. male who presents today for follow up on his lumbar spine.  Patient had been having a lot of pain in his low back that radiates into the bilateral buttock and posterior thighs.  He said he has not been working for the last 2 weeks and has not had any leg pain.  He is still having some back pain but it is less.  He has not been able to get into therapy yet.  There was some miscommunication and difficulty getting in but he is supposed to start seeing therapy on 07/31/2022.  He is interested in trying this treatment to see if this will help him with his pain.   Treatments tried: Meloxicam, cyclobenzaprine, tramadol, Medrol Dosepak, gabapentin, lumbar steroid injection     Physical  Exam:   General: no acute distress, appears stated age Neurologic: alert, answering questions appropriately, following commands Respiratory: unlabored breathing on room air, symmetric chest rise Psychiatric: appropriate affect, normal cadence to speech     MSK (spine):   -Strength exam                                                   Left                  Right EHL                              5/5                  5/5 TA                                 5/5                  5/5 GSC  5/5                  5/5 Knee extension            5/5                  5/5 Hip flexion                    5/5                  5/5   -Sensory exam                           Sensation intact to light touch in L3-S1 nerve distributions of bilateral lower extremities     -Straight leg raise: Negative bilaterally -Femoral nerve stretch test: Negative bilaterally -Clonus: no beats bilaterally   Imaging: XR of the lumbar spine from 03/21/2022 and 04/25/2022 was previously independently reviewed and interpreted, showing disc height loss at L5/S1. No fracture or dislocation seen. No evidence of instability on flexion/extension views.    MRI of the lumbar spine from 05/05/2022 was previously independently reviewed and interpreted, showing central and lateral recess stenosis at L4/5.  Bilateral foraminal stenosis at L3/4 and L4/5.    Patient name: Ethan Silva Patient MRN: [id number] Date of visit: 07/25/22

## 2022-07-27 ENCOUNTER — Telehealth: Payer: Self-pay | Admitting: *Deleted

## 2022-07-27 ENCOUNTER — Ambulatory Visit: Payer: 59 | Admitting: Orthopedic Surgery

## 2022-07-27 NOTE — Telephone Encounter (Signed)
Used interpreter  # [phone] - ( [employer]  interpreter) The patient has been notified of the result and verbalized understanding.  All questions (if any) were answered. Tobin Chad, RN 07/27/2022 3:41 PM

## 2022-07-27 NOTE — Telephone Encounter (Signed)
-----   Message from Bryan Lemma sent at 07/21/2022  3:19 PM EDT ----- Follow-up lab results after being seen by lipid clinic.  Started on 40 mg rosuvastatin.  Labs look dramatically better than 2 months ago.  Total cholesterol way back down to 156 with LDL down to 94 from 160.  Still not at goal, but much better than they have been.  I think we can reassess labs in a couple months.  We may or may not need to consider additional options.  These options will be discussed in the lipid clinic and follow-up on July 30.  Bryan Lemma, MD

## 2022-07-31 ENCOUNTER — Ambulatory Visit: Payer: 59

## 2022-07-31 ENCOUNTER — Ambulatory Visit: Payer: 59 | Attending: Cardiology | Admitting: Pharmacist Clinician (PhC)/ Clinical Pharmacy Specialist

## 2022-07-31 ENCOUNTER — Other Ambulatory Visit: Payer: Self-pay

## 2022-07-31 ENCOUNTER — Encounter: Payer: Self-pay | Admitting: Pharmacist Clinician (PhC)/ Clinical Pharmacy Specialist

## 2022-07-31 VITALS — BP 135/94 | HR 63

## 2022-07-31 DIAGNOSIS — M6281 Muscle weakness (generalized): Secondary | ICD-10-CM

## 2022-07-31 DIAGNOSIS — I10 Essential (primary) hypertension: Secondary | ICD-10-CM

## 2022-07-31 DIAGNOSIS — M5459 Other low back pain: Secondary | ICD-10-CM | POA: Diagnosis present

## 2022-07-31 DIAGNOSIS — R2689 Other abnormalities of gait and mobility: Secondary | ICD-10-CM | POA: Diagnosis present

## 2022-07-31 DIAGNOSIS — E785 Hyperlipidemia, unspecified: Secondary | ICD-10-CM

## 2022-07-31 MED ORDER — CHLORTHALIDONE 25 MG PO TABS: 25.0000 mg | ORAL_TABLET | Freq: Every day | ORAL | 3 refills | Status: DC

## 2022-07-31 NOTE — Assessment & Plan Note (Signed)
Assessment: Patient with ASCVD not at LDL goal of < 70 Most recent LDL 94 on rosuvastatin 40 Has been compliant with high intensity statin   Plan: Labs reviewed by Dr. Herbie Baltimore - will re-assess in 3 months

## 2022-07-31 NOTE — Therapy (Signed)
OUTPATIENT PHYSICAL THERAPY THORACOLUMBAR EVALUATION   Patient Name: Ethan Silva MRN: [id number] DOB:11-Feb-1965, 57 y.o., male Today's Date: 08/01/2022  END OF SESSION:  PT End of Session - 07/31/22 1441     Visit Number 1    Number of Visits 17    Date for PT Re-Evaluation 09/25/22    Authorization Type [place]    PT Start Time 1355    PT Stop Time 1435    PT Time Calculation (min) 40 min    Activity Tolerance Patient tolerated treatment well    Behavior During Therapy WFL for tasks assessed/performed              Past Medical History:  Diagnosis Date   Aortic stenosis, mild 07/2012   Very mild aortic stenosis noted on echo-mean gradient 11 mmHg.   Bile duct leak    CAD in native artery 07/14/2020   Small branch of OM 3 with 80% ostial stenosis.  Too small for PCI.  Otherwise questionable 30% LM and 30% PDA.   Chronic kidney disease 2017   left kidney stones   Headache(784.0)    Hx: of when BP is elevated   Hyperlipidemia    Hypertension    Non-STEMI (non-ST elevated myocardial infarction) (HCC) 07/13/2020   Admitted with hypertension and chest pain.  Cardiac cath showed small branch of an OM3 with 85% ostial stenosis not amenable to PCI.  No other significant disease noted.  Normal EF on echo.  Medical management.   Numbness and tingling in hands    Hx: of   Prediabetes    Past Surgical History:  Procedure Laterality Date   BILIARY DILATION  03/16/2021   Procedure: BILIARY DILATION;  Surgeon: Rachael Fee, MD;  Location: [facility];  Service: Endoscopy;;   CHOLECYSTECTOMY N/A 07/11/2012   Procedure: LAPAROSCOPIC CHOLECYSTECTOMY WITH INTRAOPERATIVE CHOLANGIOGRAM;  Surgeon: Axel Filler, MD;  Location: [facility] OR;  Service: General;  Laterality: N/A;   COLONOSCOPY     ENDOSCOPIC RETROGRADE CHOLANGIOPANCREATOGRAPHY (ERCP) WITH PROPOFOL N/A 03/16/2021   Procedure: ENDOSCOPIC RETROGRADE CHOLANGIOPANCREATOGRAPHY (ERCP) WITH PROPOFOL;   Surgeon: Rachael Fee, MD;  Location: [facility];  Service: Endoscopy;  Laterality: N/A;   ERCP  07/16/2012   ERCP N/A 07/16/2012   Procedure: ENDOSCOPIC RETROGRADE CHOLANGIOPANCREATOGRAPHY (ERCP);  Surgeon: Louis Meckel, MD;  Location: [facility] OR;  Service: Gastroenterology;  Laterality: N/A;   ERCP N/A 10/06/2012   Procedure: ENDOSCOPIC RETROGRADE CHOLANGIOPANCREATOGRAPHY (ERCP);  Surgeon: Louis Meckel, MD;  Location: [facility];  Service: Endoscopy;  Laterality: N/A;   ESOPHAGOGASTRODUODENOSCOPY N/A 03/16/2021   Procedure: ESOPHAGOGASTRODUODENOSCOPY (EGD);  Surgeon: Rachael Fee, MD;  Location: [facility];  Service: Endoscopy;  Laterality: N/A;   EUS N/A 03/16/2021   Procedure: UPPER ENDOSCOPIC ULTRASOUND (EUS) RADIAL;  Surgeon: Rachael Fee, MD;  Location: [facility];  Service: Endoscopy;  Laterality: N/A;   HAND SURGERY     LEFT HEART CATH AND CORONARY ANGIOGRAPHY N/A 07/14/2020   Procedure: LEFT HEART CATH AND CORONARY ANGIOGRAPHY;  Surgeon: Lyn Records, MD;  Location: [facility];  Service: Cardiovascular; ? Culprit Lesion ~ 80% ostial small OM3 (too small & ostial for PCI). ~30% mid LM (at a bend).  LAD with D1 & D2 - normal, RCA minimal luminal irregularities ~ 30%. PDA   SPHINCTEROTOMY  03/16/2021   Procedure: SPHINCTEROTOMY;  Surgeon: Rachael Fee, MD;  Location: [facility];  Service: Endoscopy;;   TRANSTHORACIC ECHOCARDIOGRAM  07/14/2020   (NSTEMI/Accelerated Hypertension): EF 55  to 60%.  No RWM A.  Very mild Aortic Stenosis-mean gradient 11 to mmHg.   Patient Active Problem List   Diagnosis Date Noted   Mild aortic stenosis by prior echocardiogram 05/14/2022   Back pain of lumbar region with sciatica 04/16/2022   Degenerative disc disease, lumbar 04/16/2022   Lumbosacral pain 03/27/2022   Lumbosacral strain 03/21/2022   Musculoskeletal pain 03/21/2022   Chronic foot pain, right 10/30/2021   Calcaneal spur of right foot 10/30/2021   Aortic  atherosclerosis (HCC) 04/25/2021   Coronary artery disease involving native coronary artery of native heart with angina pectoris (HCC) 07/15/2020   Prediabetes 07/15/2020   BMI 30.0-30.9,adult 08/30/2015   Hyperlipidemia with target LDL less than 70 08/30/2015   Essential hypertension 10/11/2012    PCP: Georgina Quint, MD  REFERRING PROVIDER: London Sheer, MD  REFERRING DIAG: [id number] (ICD-10-CM) - Lumbar stenosis with neurogenic claudication  Rationale for Evaluation and Treatment: Rehabilitation  THERAPY DIAG:  Other low back pain - Plan: PT plan of care cert/re-cert  Muscle weakness (generalized) - Plan: PT plan of care cert/re-cert  Other abnormalities of gait and mobility - Plan: PT plan of care cert/re-cert  ONSET DATE: chronic per pt report, worsening a few months ago  SUBJECTIVE:                                                                                                                                                                                           SUBJECTIVE STATEMENT: 07/31/2022: Assistance of in person interpreter.   Pt presents to PT with continued reports of chronic LBP that refers into bilateral LE. Also notes occasional numbness in L LE. Had a visit with cardiology today and BP has been stable both today and over last few days.   Per 07/12/22 no charge visit: Appreciate assistance of [employer] video interpreter services. Pt states he has had back pain and referral into BLE, R>L. MRI performed in May. States pain has been going on for quite some time, worsening over past few months. Gradually worsens with repeated activity and improves with rest. Symptoms tend to fluctuate. Difficulty with yardwork. Cannot recall any precipitating factors for this exacerbation. States he was told he would likely benefit from surgery but he wanted to try therapy first.   PERTINENT HISTORY:  HTN, CAD + angina, prediabetes  PAIN:  Are you having pain: Yes,  3/10 Location/description: low back, occurs in both LE but usually one more than the other Best-worst over past week: 0-5/10  - aggravating factors: bending forward, walking, standing, work activities, sitting for a while - Easing factors: rest, injection  PRECAUTIONS: cardiac  history  WEIGHT BEARING RESTRICTIONS: none  FALLS:  Has patient fallen in last 6 months? 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 Transitional anatomy.  Please see detailed description above. 2. 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 3. Mild spinal canal stenosis at L2-3, L3-4 and L4-5. 4. Mild to moderate bilateral neural foraminal narrowing at L3-4. 5. Moderate bilateral neural foraminal narrowing at L4-5. 6. Accessory piriform muscle bilaterally."  PATIENT SURVEYS:  FOTO: 56% function; 67% predicted  SCREENING FOR RED FLAGS: Red flag questioning/screening reassuring, MRI as above  COGNITION: Overall cognitive status: Within functional limits for tasks assessed     SENSATION: NT  MUSCLE LENGTH: NT  POSTURE: guarded trunk posture with elevated upper traps bilaterally  PALPATION: Deferred  LUMBAR ROM:   AROM eval  Flexion 50% decreased  Extension 50% decrease  Right lateral flexion   Left lateral flexion   Right rotation   Left rotation    (Blank rows = not tested) Comments: pain with raising from flexion   LOWER EXTREMITY ROM:     Active  Right eval Left eval  Hip flexion    Hip  extension    Hip internal rotation    Hip external rotation    Knee extension    Knee flexion    (Blank rows = not tested) (Key: WFL = within functional limits not formally assessed, * = concordant pain, s = stiffness/stretching sensation, NT = not tested)  Comments: N/A   LOWER EXTREMITY MMT:    MMT Right eval Left eval  Hip flexion 4/5 4/5  Hip abduction (modified sitting) 4/5 4/5  Hip internal rotation    Hip external rotation    Knee flexion    Knee extension    Ankle dorsiflexion     (Blank rows = not tested) (Key: WFL = within functional limits not formally assessed, * = concordant pain, s = stiffness/stretching sensation, NT = not tested)  Comments: See chart   LUMBAR SPECIAL TESTS:  Slump: Negative SLR: Negative   FUNCTIONAL TESTS:  30 Second Sit to Stand: 5 reps   GAIT: Distance walked: within clinic Assistive device utilized: None Level of assistance: Complete Independence Comments: deferring formal assessment 07/12/22   Vitals 07/12/22: HR 68-70, SpO2 94-95%, BP L arm 163/103.  Pt requests vitals check as he states he feels his BP is elevated (states he typically feels a little "pressure" in his head when BP is high). Denies any other adverse symptoms (SOB, chest pain/pressure, UE symptoms, dizziness, etc). States his BP was 130s systolic and 90s diastolic earlier this morning   TODAY'S TREATMENT:  [facility] Adult PT Treatment:                                                DATE: 07/31/22 Therapeutic Exercise: Supine PPT x 5 - 5" hold (difficult) LTR x 5 each Supine SLR x 5 each S/L hip abd x 5 DKTC x 30" Modified thomas stretch x 30" each  PATIENT EDUCATION:  Education details: eval findings, FOTO, HEP, POC Person educated: Patient Education method: Explanation, Demonstration, Tactile cues, Verbal cues Education comprehension:  verbalized understanding, returned demonstration, verbal cues required, tactile cues required, and needs further education    HOME EXERCISE PROGRAM: Access Code: [email or web address] URL: [email or web address] Date: 07/31/2022 Prepared by: Edwinna Areola  Exercises - Supine Posterior Pelvic Tilt  - 1 x daily - 7 x weekly - 2 sets - 10 reps - 5 sec hold - Supine Lower Trunk Rotation  - 1 x daily - 7 x weekly - 2 sets - 10 reps - Active Straight Leg Raise with Quad Set  - 1 x daily - 7 x weekly - 3 sets - 10 reps - Sidelying Hip Abduction  - 1 x daily - 7 x weekly - 3 sets - 10 reps - Supine Double Knee to Chest  - 1 x daily - 7 x weekly - 2 reps - 30 sec hold - Modified Thomas Stretch  - 1 x daily - 7 x weekly - 2 reps - 60 sec hold  ASSESSMENT:  CLINICAL IMPRESSION: Pt is a 57 y/o M who presents to PT with reports of chronic LBP. Physical findings are consistent with MD impression as pt has decrease in core/proximal hip strength and general decrease in functional mobility. FOTO score shows subjective decrease in functional ability below PLOF. Pt would benefit from skilled PT services working on decreasing pain and improving comfort.    OBJECTIVE IMPAIRMENTS: decreased activity tolerance, decreased mobility, difficulty walking, decreased ROM, decreased strength, and pain.   ACTIVITY LIMITATIONS: carrying, lifting, standing, squatting, stairs, transfers, and locomotion level  PARTICIPATION LIMITATIONS: driving, shopping, community activity, occupation, and yard work  PERSONAL FACTORS: Time since onset of injury/illness/exacerbation and 1-2 comorbidities: HTN, CAD + angina, prediabetes are also affecting patient's functional outcome.   REHAB POTENTIAL: Good  CLINICAL DECISION MAKING: Evolving/moderate complexity  EVALUATION COMPLEXITY: Moderate   GOALS: Goals reviewed with patient? No    SHORT TERM GOALS: Target date: 08/21/2022   Pt will demonstrate appropriate understanding  and performance of initially prescribed HEP in order to facilitate improved independence with management of symptoms.  Baseline: HEP provided on eval Goal status: INITIAL   2.  Pt will self report lower back pain no greater than 3/10 for improved comfort and functional ability Baseline: 5/10 at worst Goal status: INITIAL    LONG TERM GOALS: Target date: 09/25/2022   Pt will score 67 on FOTO in order to demonstrate improved perception of functional status due to symptoms.  Baseline: 56 Goal status: INITIAL  2.  Pt will self report lower back pain no greater than 1/10 for improved comfort and functional ability Baseline: 5/10 at worst Goal status: INITIAL   3.  Pt will demonstrate hip MMT of 5/5 in order to demonstrate improved strength for functional movements.  Baseline: see MMT chart Goal status: INITIAL  4.  Pt will increase 30 Second Sit to Stand rep count to no less  than 7 reps for improved balance, strength, and functional mobility Baseline: 5 reps with UE Goal status: INITIAL    PLAN:  PT FREQUENCY: 2x/week  PT DURATION: 8 weeks  PLANNED INTERVENTIONS: Therapeutic exercises, Therapeutic activity, Neuromuscular re-education, Balance training, Gait training, Patient/Family education, Self Care, Joint mobilization, Dry Needling, Electrical stimulation, Cryotherapy, Moist heat, Manual therapy, and Re-evaluation  PLAN FOR NEXT SESSION: assess HEP response, core/hip strengthening, hip flexor stretching  [employer] Authorization   Choose one: Rehabilitative  Standardized Assessment or Functional Outcome Tool: FOTO  Score or Percent Disability: 56% function; 67% predicted  Body Parts Treated (Select each separately):  Lumbopelvic. Overall deficits/functional limitations for body part selected: moderate N/A. Overall deficits/functional limitations for body part selected: N/A N/A. Overall deficits/functional limitations for body part selected: N/A   If treatment provided  at initial evaluation, no treatment charged due to lack of authorization.    Eloy End PT  08/01/22 8:24 AM

## 2022-07-31 NOTE — Assessment & Plan Note (Signed)
Assessment: BP is uncontrolled in office BP 135/94 mmHg;  above the goal (<130/80). Tolerates amlodipine and lisinopril well without any side effects Denies SOB, palpitation, chest pain, headaches,or swelling Reiterated the importance of regular exercise and low salt diet   Plan:  Start taking chlorthalidone 25 mg once daily in the morning Continue taking lisinopril and amlodipine Patient to keep record of BP readings with heart rate and report to [place] at the next visit Patient to follow up with PharmD in 6 weeks  Labs ordered today:  BMET in 2 weeks

## 2022-07-31 NOTE — Progress Notes (Signed)
Office Visit    Patient Name: Ethan Silva Date of Encounter: 07/31/2022  Primary Care Provider:  Georgina Quint, MD Primary Cardiologist:  Bryan Lemma, MD  Chief Complaint    Hyperlipidemia, hypertension  Significant Past Medical History   HTN Amlodipine increased to 10 mg at last visit, also lisinopril  CAD 7/22 cath - 80% 3rd OM, mid LAD 30-40%, prox RCA 30%  preDM 5/24 A1c 6.3     No Known Allergies  History of Present Illness    Ethan Silva is a 57 y.o. male patient of Dr Herbie Baltimore, in the office today to discuss options for managing cholesterol as well as hypertension.   He comes with a [place] interpreter.  He saw Dr. Herbie Baltimore last month, BP was at 154/98 and amlodipine was increased to 10 mg.   He states he has checked home readings several times and that they are still running higher than goal.  For his cholesterol he was prescribed rosuvastatin and Nexlizet.  From conversation it sounds like he was only taking the Nexlizet, but it became cost prohibitive and he then re-started rosuvastatin.  He has been on that for only 3 days now.    Today he returns for follow up.  At his last visit lisinopril was increased to 40 mg, and on rosuvastatin 40 mg his LDL dropped from 160 to 94.   He is doing well and reports no issues with either the rosuvastatin or increase in lisinopril.    Insurance Carrier: Higher education careers adviser preferred - Tier 4, [place], 28 day/fill limit)  135/94  hr 63 144/90 LDL Cholesterol goal:  LDL < 70  BP goal:  < 130/80  Current Medications:   rosuvastatin 40 mg   Amlodipine 10 mg qd, (pm) lisinopril 40 mg every day (am)   Family Hx:  mother with high BP, not well controlled; father deceased - gall bladder surgery complications; 1 brother with hypertension; youngest has BP and palpitations   Social Hx: Tobacco:  no Alcohol:  no   Caffeine: only mint tea on occasion  Diet:   mostly ome cooked meals; grilled chicken,  or makes broth with vegetables; doesn't snack much, but occasional crackers   Exercise: on his feet, moving around all day - works as a Education administrator  BP readings: Brought home cuff ([place]), about 59 year old.  Read within 10 points of office reading  19 AM readings average 136/90 HR 65  (range 122-146/76-103)  19 PM readings average 134/91 HR 68  (range 115-149/81-100)   Accessory Clinical Findings   Lab Results  Component Value Date   CHOL 156 07/19/2022   HDL 41 07/19/2022   LDLCALC 94 07/19/2022   TRIG 113 07/19/2022   CHOLHDL 3.8 07/19/2022    Lipoprotein (a)  Date/Time Value Ref Range Status  05/14/2022 09:20 AM 38.0 <75.0 nmol/L Final    Comment:    Note:  Values greater than or equal to 75.0 nmol/L may        indicate an independent risk factor for CHD,        but must be evaluated with caution when applied        to non-Caucasian populations due to the        influence of genetic factors on Lp(a) across        ethnicities.     Lab Results  Component Value Date   ALT 28 07/19/2022   AST 24 07/19/2022   ALKPHOS 51 07/19/2022  BILITOT 0.5 07/19/2022   Lab Results  Component Value Date   CREATININE 0.92 07/19/2022   BUN 17 07/19/2022   NA 140 07/19/2022   K 4.1 07/19/2022   CL 103 07/19/2022   CO2 24 07/19/2022   Lab Results  Component Value Date   HGBA1C 6.3 (H) 05/14/2022    Home Medications    Current Outpatient Medications  Medication Sig Dispense Refill   chlorthalidone (HYGROTON) 25 MG tablet Take 1 tablet (25 mg total) by mouth daily. 90 tablet 3   acetaminophen (TYLENOL) 325 MG tablet Take 325 mg by mouth every 6 (six) hours as needed for moderate pain.     amLODipine (NORVASC) 10 MG tablet Take 1 tablet (10 mg total) by mouth daily. 90 tablet 3   aspirin 81 MG EC tablet Take 1 tablet (81 mg total) by mouth daily. Swallow whole. 90 tablet 3   ciprofloxacin (CIPRO) 500 MG tablet Take 1 tablet (500 mg total) by mouth 2 (two) times daily. 14 tablet 0    gabapentin (NEURONTIN) 300 MG capsule Take 1 capsule (300 mg total) by mouth 3 (three) times daily. 90 capsule 0   lisinopril (ZESTRIL) 40 MG tablet Take 1 tablet (40 mg total) by mouth daily. 90 tablet 3   nitroGLYCERIN (NITROSTAT) 0.4 MG SL tablet Place 1 tablet (0.4 mg total) under the tongue every 5 (five) minutes x 3 doses as needed for chest pain. 25 tablet 6   pantoprazole (PROTONIX) 40 MG tablet Take 1 tablet by mouth once daily 90 tablet 3   polyethylene glycol powder (GLYCOLAX/MIRALAX) 17 GM/SCOOP powder Take 17 g by mouth 2 (two) times daily as needed. 3350 g 1   rosuvastatin (CRESTOR) 40 MG tablet Take 1 tablet (40 mg total) by mouth daily. 90 tablet 3   No current facility-administered medications for this visit.     HYPERTENSION CONTROL Vitals:   07/31/22 0823 07/31/22 0829  BP: (!) 144/90 (!) 135/94    The patient's blood pressure is elevated above target today.  In order to address the patient's elevated BP: Blood pressure will be monitored at home to determine if medication changes need to be made.; A new medication was prescribed today.      Assessment & Plan    Essential hypertension Assessment: BP is uncontrolled in office BP 135/94 mmHg;  above the goal (<130/80). Tolerates amlodipine and lisinopril well without any side effects Denies SOB, palpitation, chest pain, headaches,or swelling Reiterated the importance of regular exercise and low salt diet   Plan:  Start taking chlorthalidone 25 mg once daily in the morning Continue taking lisinopril and amlodipine Patient to keep record of BP readings with heart rate and report to [place] at the next visit Patient to follow up with PharmD in 6 weeks  Labs ordered today:  BMET in 2 weeks   Hyperlipidemia with target LDL less than 70 Assessment: Patient with ASCVD not at LDL goal of < 70 Most recent LDL 94 on rosuvastatin 40 Has been compliant with high intensity statin   Plan: Labs reviewed by Dr. Herbie Baltimore - will  re-assess in 3 months   Phillips Hay PharmD CPP [facility]  [address] [place], [place] [address] [phone]

## 2022-07-31 NOTE — Patient Instructions (Signed)
Follow up appointment: Sept 9 at 8:30 am  Go to the lab in 2 weeks (week of Sept 12)  Take your BP meds as follows: start chlorthalidone 25 mg once daily in the mornings  Continue with all other medications  Check your blood pressure at home daily (if able) and keep record of the readings.  Hypertension "High blood pressure"  Hypertension is often called "The Silent Killer." It rarely causes symptoms until it is extremely  high or has done damage to other organs in the body. For this reason, you should have your  blood pressure checked regularly by your physician. We will check your blood pressure  every time you see a provider at one of our offices.   Your blood pressure reading consists of two numbers. Ideally, blood pressure should be  below 120/80. The first ("top") number is called the systolic pressure. It measures the  pressure in your arteries as your heart beats. The second ("bottom") number is called the diastolic pressure. It measures the pressure in your arteries as the heart relaxes between beats.  The benefits of getting your blood pressure under control are enormous. A 10-point  reduction in systolic blood pressure can reduce your risk of stroke by 27% and heart failure by 28%  Your blood pressure goal is < 130/80  To check your pressure at home you will need to:  1. Sit up in a chair, with feet flat on the floor and back supported. Do not cross your ankles or legs. 2. Rest your left arm so that the cuff is about heart level. If the cuff goes on your upper arm,  then just relax the arm on the table, arm of the chair or your lap. If you have a wrist cuff, we  suggest relaxing your wrist against your chest (think of it as Pledging the Flag with the  wrong arm).  3. Place the cuff snugly around your arm, about 1 inch above the crook of your elbow. The  cords should be inside the groove of your elbow.  4. Sit quietly, with the cuff in place, for about 5 minutes. After  that 5 minutes press the power  button to start a reading. 5. Do not talk or move while the reading is taking place.  6. Record your readings on a sheet of paper. Although most cuffs have a memory, it is often  easier to see a pattern developing when the numbers are all in front of you.  7. You can repeat the reading after 1-3 minutes if it is recommended  Make sure your bladder is empty and you have not had caffeine or tobacco within the last 30 min  Always bring your blood pressure log with you to your appointments. If you have not brought your monitor in to be double checked for accuracy, please bring it to your next appointment.  You can find a list of quality blood pressure cuffs at [email or web address]

## 2022-08-02 DIAGNOSIS — Z419 Encounter for procedure for purposes other than remedying health state, unspecified: Secondary | ICD-10-CM | POA: Diagnosis not present

## 2022-08-10 ENCOUNTER — Emergency Department (HOSPITAL_BASED_OUTPATIENT_CLINIC_OR_DEPARTMENT_OTHER): Payer: 59

## 2022-08-10 ENCOUNTER — Encounter (HOSPITAL_BASED_OUTPATIENT_CLINIC_OR_DEPARTMENT_OTHER): Payer: Self-pay | Admitting: Emergency Medicine

## 2022-08-10 ENCOUNTER — Other Ambulatory Visit: Payer: Self-pay

## 2022-08-10 ENCOUNTER — Emergency Department (HOSPITAL_BASED_OUTPATIENT_CLINIC_OR_DEPARTMENT_OTHER)
Admission: EM | Admit: 2022-08-10 | Discharge: 2022-08-10 | Disposition: A | Discharge status: Home / Self Care | Payer: 59

## 2022-08-10 DIAGNOSIS — R1013 Epigastric pain: Secondary | ICD-10-CM | POA: Insufficient documentation

## 2022-08-10 DIAGNOSIS — R6883 Chills (without fever): Secondary | ICD-10-CM | POA: Insufficient documentation

## 2022-08-10 DIAGNOSIS — R110 Nausea: Secondary | ICD-10-CM | POA: Insufficient documentation

## 2022-08-10 DIAGNOSIS — Z20822 Contact with and (suspected) exposure to covid-19: Secondary | ICD-10-CM | POA: Insufficient documentation

## 2022-08-10 DIAGNOSIS — R109 Unspecified abdominal pain: Secondary | ICD-10-CM

## 2022-08-10 DIAGNOSIS — I10 Essential (primary) hypertension: Secondary | ICD-10-CM | POA: Insufficient documentation

## 2022-08-10 DIAGNOSIS — R351 Nocturia: Secondary | ICD-10-CM | POA: Insufficient documentation

## 2022-08-10 DIAGNOSIS — Z79899 Other long term (current) drug therapy: Secondary | ICD-10-CM | POA: Diagnosis not present

## 2022-08-10 DIAGNOSIS — Z7982 Long term (current) use of aspirin: Secondary | ICD-10-CM | POA: Diagnosis not present

## 2022-08-10 LAB — COMPREHENSIVE METABOLIC PANEL
ALT: 39 U/L (ref 0–44)
AST: 33 U/L (ref 15–41)
Albumin: 4.6 g/dL (ref 3.5–5.0)
Alkaline Phosphatase: 43 U/L (ref 38–126)
Anion gap: 10 (ref 5–15)
BUN: 20 mg/dL (ref 6–20)
CO2: 31 mmol/L (ref 22–32)
Calcium: 9.5 mg/dL (ref 8.9–10.3)
Chloride: 100 mmol/L (ref 98–111)
Creatinine, Ser: 0.95 mg/dL (ref 0.61–1.24)
GFR, Estimated: >60 mL/min (ref >60)
Glucose, Bld: 145 mg/dL — ABNORMAL HIGH (ref 70–99)
Potassium: 2.8 mmol/L — ABNORMAL LOW (ref 3.5–5.1)
Sodium: 141 mmol/L (ref 135–145)
Total Bilirubin: 0.5 mg/dL (ref 0.3–1.2)
Total Protein: 7.4 g/dL (ref 6.5–8.1)

## 2022-08-10 LAB — CBC WITH DIFFERENTIAL/PLATELET
Abs Immature Granulocytes: 0.03 10*3/uL (ref 0.00–0.07)
Basophils Absolute: 0 10*3/uL (ref 0.0–0.1)
Basophils Relative: 0 %
Eosinophils Absolute: 0.1 10*3/uL (ref 0.0–0.5)
Eosinophils Relative: 1 %
HCT: 46.5 % (ref 39.0–52.0)
Hemoglobin: 16.8 g/dL (ref 13.0–17.0)
Immature Granulocytes: 0 %
Lymphocytes Relative: 24 %
Lymphs Abs: 1.9 10*3/uL (ref 0.7–4.0)
MCH: 30.4 pg (ref 26.0–34.0)
MCHC: 36.1 g/dL — ABNORMAL HIGH (ref 30.0–36.0)
MCV: 84.1 fL (ref 80.0–100.0)
Monocytes Absolute: 0.7 10*3/uL (ref 0.1–1.0)
Monocytes Relative: 8 %
Neutro Abs: 5.3 10*3/uL (ref 1.7–7.7)
Neutrophils Relative %: 67 %
Platelets: 209 10*3/uL (ref 150–400)
RBC: 5.53 MIL/uL (ref 4.22–5.81)
RDW: 12.8 % (ref 11.5–15.5)
WBC: 8 10*3/uL (ref 4.0–10.5)
nRBC: 0 % (ref 0.0–0.2)

## 2022-08-10 LAB — URINALYSIS, ROUTINE W REFLEX MICROSCOPIC
Bacteria, UA: NONE SEEN
Bilirubin Urine: NEGATIVE
Glucose, UA: NEGATIVE mg/dL
Ketones, ur: NEGATIVE mg/dL
Leukocytes,Ua: NEGATIVE
Nitrite: NEGATIVE
Specific Gravity, Urine: 1.023 (ref 1.005–1.030)
pH: 5.5 (ref 5.0–8.0)

## 2022-08-10 LAB — SARS CORONAVIRUS 2 BY RT PCR: SARS Coronavirus 2 by RT PCR: NEGATIVE

## 2022-08-10 LAB — MAGNESIUM: Magnesium: 2 mg/dL (ref 1.7–2.4)

## 2022-08-10 MED ORDER — POTASSIUM CHLORIDE CRYS ER 20 MEQ PO TBCR
40.0000 meq | EXTENDED_RELEASE_TABLET | Freq: Once | ORAL | Status: AC
  Administered 2022-08-10: 40 meq via ORAL
  Filled 2022-08-10: qty 2

## 2022-08-10 MED ORDER — KETOROLAC TROMETHAMINE 15 MG/ML IJ SOLN
15.0000 mg | Freq: Once | INTRAMUSCULAR | Status: AC
  Administered 2022-08-10: 15 mg via INTRAVENOUS
  Filled 2022-08-10: qty 1

## 2022-08-10 MED ORDER — ACETAMINOPHEN 500 MG PO TABS
1000.0000 mg | ORAL_TABLET | Freq: Once | ORAL | Status: DC
  Filled 2022-08-10: qty 2

## 2022-08-10 NOTE — ED Provider Notes (Signed)
[place] EMERGENCY DEPARTMENT AT [facility] Provider Note   CSN: [id number] Arrival date & time: 08/10/22  1415     History Chief Complaint  Patient presents with   Urinary Frequency    Ethan Silva is a 57 y.o. male.  Patient presents to the emergency department past history significant for hypertension, prediabetes, lumbar spine pain with complaints of increased urinary frequency.  Reports that he was seen in the emergency department a month ago for increased urinary frequency and diagnosed with a UTI.  Reports that this is now largely resolved with the antibiotic that he was taking.  Currently endorses epigastric pain with eating some mild associated nausea but denies any vomiting.  No diarrhea.  Although patient has a history of chronic low back pain, does report the back pain is somewhat worsening with current symptoms.  Is also reported some infrequent chills 1 episode of nausea, over them.   Urinary Frequency       Home Medications Prior to Admission medications   Medication Sig Start Date End Date Taking? Authorizing Provider  acetaminophen (TYLENOL) 325 MG tablet Take 325 mg by mouth every 6 (six) hours as needed for moderate pain.    [provider]  amLODipine (NORVASC) 10 MG tablet Take 1 tablet (10 mg total) by mouth daily. 05/14/22 08/12/22  Marykay Lex, MD  aspirin 81 MG EC tablet Take 1 tablet (81 mg total) by mouth daily. Swallow whole. 07/15/20   O'NealRonnald Ramp, MD  chlorthalidone (HYGROTON) 25 MG tablet Take 1 tablet (25 mg total) by mouth daily. 07/31/22 10/29/22  Marykay Lex, MD  ciprofloxacin (CIPRO) 500 MG tablet Take 1 tablet (500 mg total) by mouth 2 (two) times daily. 07/17/22   Mesner, Barbara Cower, MD  gabapentin (NEURONTIN) 300 MG capsule Take 1 capsule (300 mg total) by mouth 3 (three) times daily. 04/25/22 05/25/22  London Sheer, MD  lisinopril (ZESTRIL) 40 MG tablet Take 1 tablet (40 mg total) by mouth daily.  06/18/22 09/16/22  Marykay Lex, MD  nitroGLYCERIN (NITROSTAT) 0.4 MG SL tablet Place 1 tablet (0.4 mg total) under the tongue every 5 (five) minutes x 3 doses as needed for chest pain. 11/13/21   Marykay Lex, MD  pantoprazole (PROTONIX) 40 MG tablet Take 1 tablet by mouth once daily 05/14/22   Marykay Lex, MD  polyethylene glycol powder [facility]) 17 GM/SCOOP powder Take 17 g by mouth 2 (two) times daily as needed. 10/11/20   Corwin Levins, MD  rosuvastatin (CRESTOR) 40 MG tablet Take 1 tablet (40 mg total) by mouth daily. 10/30/21 10/25/22  Georgina Quint, MD      Allergies    Patient has no known allergies.    Review of Systems   Review of Systems  Genitourinary:  Positive for frequency.  All other systems reviewed and are negative.   Physical Exam Updated Vital Signs BP (!) 141/95 (BP Location: Right Arm)   Pulse 86   Temp 98.6 F (37 C) (Oral)   Resp 17   SpO2 96%  Physical Exam Vitals and nursing note reviewed.  Constitutional:      General: He is not in acute distress.    Appearance: He is well-developed.  HENT:     Head: Normocephalic and atraumatic.  Eyes:     Conjunctiva/sclera: Conjunctivae normal.  Cardiovascular:     Rate and Rhythm: Normal rate and regular rhythm.     Heart sounds: No murmur heard. Pulmonary:  Effort: Pulmonary effort is normal. No respiratory distress.     Breath sounds: Normal breath sounds.  Abdominal:     General: Abdomen is flat.     Palpations: Abdomen is soft.     Tenderness: There is abdominal tenderness in the epigastric area. There is right CVA tenderness and left CVA tenderness.    Musculoskeletal:        General: No swelling.     Cervical back: Neck supple.  Skin:    General: Skin is warm and dry.     Capillary Refill: Capillary refill takes less than 2 seconds.  Neurological:     Mental Status: He is alert.  Psychiatric:        Mood and Affect: Mood normal.     ED Results / Procedures /  Treatments   Labs (all labs ordered are listed, but only abnormal results are displayed) Labs Reviewed  URINALYSIS, ROUTINE W REFLEX MICROSCOPIC - Abnormal; Notable for the following components:      Result Value   Hgb urine dipstick MODERATE (*)    Protein, ur TRACE (*)    All other components within normal limits  CBC WITH DIFFERENTIAL/PLATELET - Abnormal; Notable for the following components:   MCHC 36.1 (*)    All other components within normal limits  COMPREHENSIVE METABOLIC PANEL - Abnormal; Notable for the following components:   Potassium 2.8 (*)    Glucose, Bld 145 (*)    All other components within normal limits  SARS CORONAVIRUS 2 BY RT PCR  MAGNESIUM    EKG None  Radiology CT Renal Stone Study  Result Date: 08/10/2022 CLINICAL DATA:  Abdominal and flank pain for 2 weeks. Urinary frequency. EXAM: CT ABDOMEN AND PELVIS WITHOUT CONTRAST TECHNIQUE: Multidetector CT imaging of the abdomen and pelvis was performed following the standard protocol without IV contrast. RADIATION DOSE REDUCTION: This exam was performed according to the departmental dose-optimization program which includes automated exposure control, adjustment of the mA and/or kV according to patient size and/or use of iterative reconstruction technique. COMPARISON:  04/23/2021 FINDINGS: Lower chest: No acute findings. Hepatobiliary: No mass visualized on this unenhanced exam. Prior cholecystectomy. No evidence of biliary obstruction. Pneumobilia is consistent with prior sphincterotomy. Pancreas: No mass or inflammatory process visualized on this unenhanced exam. Spleen:  Within normal limits in size. Adrenals/Urinary tract: No evidence of urolithiasis or hydronephrosis. Left renal cyst again noted (No followup imaging is recommended). Unremarkable unopacified urinary bladder. Stomach/Bowel: No evidence of obstruction, inflammatory process, or abnormal fluid collections. Normal appendix visualized. Vascular/Lymphatic: No  pathologically enlarged lymph nodes identified. No evidence of abdominal aortic aneurysm. Reproductive:  No mass or other significant abnormality. Other:  None. Musculoskeletal:  No suspicious bone lesions identified. IMPRESSION: No evidence of urolithiasis, hydronephrosis, or other acute findings. Electronically Signed   By: Danae Orleans M.D.   On: 08/10/2022 16:44    Procedures Procedures   Medications Ordered in ED Medications  ketorolac (TORADOL) 15 MG/ML injection 15 mg (15 mg Intravenous Given 08/10/22 1550)  potassium chloride SA (KLOR-CON M) CR tablet 40 mEq (40 mEq Oral Given 08/10/22 1805)    ED Course/ Medical Decision Making/ A&P                               Medical Decision Making Amount and/or Complexity of Data Reviewed Labs: ordered. Radiology: ordered.  Risk OTC drugs. Prescription drug management.   This patient presents to the ED for  concern of abdominal pain.  Differential diagnosis includes urolithiasis, pyelonephritis, UTI, bowel obstruction, appendicitis   Lab Tests:  I Ordered, and personally interpreted labs.  The pertinent results include: UA without obvious signs of infection but blood and trace protein still present, COVID-19 negative, CBC without any acute abnormalities, magnesium normal, CMP with mild hypokalemia with potassium at 3.8   Imaging Studies ordered:  I ordered imaging studies including CT renal stone study I independently visualized and interpreted imaging which showed no acute abdominal normality, renal cysts appear stable I agree with the radiologist interpretation   Medicines ordered and prescription drug management:  I ordered medication including Toradol for pain Reevaluation of the patient after these medicines showed that the patient improved I have reviewed the patients home medicines and have made adjustments as needed   Problem List / ED Course:  Patient presents to the emergency department complaints of urinary  frequency.  States has been ongoing for the last several weeks and has been worsening.  Typically worse at night.  Does report some mild urinary discomfort but not similar to when he was recently diagnosed with a UTI approximate month ago and given antibiotics does not resolve this.  Endorses some low back pain worse than typical for him.  Also reports some subjective fever and chills.  Given collection of symptoms and recent UTI, will evaluate for possible renal stone given that UA does show signs of blood still present.  Exam does also reveal some mild CVA tenderness on bilateral sides. Thankfully labs appear to be largely unremarkable with exception of mild hypokalemia 2.8.  This does not appear to be due to volume loss not sure why his potassium was slightly low however magnesium is normal at 2.0.  COVID-19 negative and UA with out obvious signs of infection but some blood still present.  Given persistent finding blood in urine and increasing urine frequency particularly at night, patient symptoms may be related to BPH and should have follow-up with primary care provider for PSA testing or further prostate workup.  Low concern at this time for any further concerning abdominal pathology such bowel obstruction, pyelonephritis, urolithiasis given all negative findings on CT scan. Patient is agreeable with this plan and verbalized understanding of return precautions.  All questions answered prior to patient discharge.  Final Clinical Impression(s) / ED Diagnoses Final diagnoses:  Nocturia  Flank pain    Rx / DC Orders ED Discharge Orders     None         Smitty Knudsen, PA-C 08/10/22 1814    Coral Spikes, DO 08/11/22 0012

## 2022-08-10 NOTE — Discharge Instructions (Signed)
Fue atendido en urgencias por aumento de la frecuencia urinaria, especialmente por la noche. Afortunadamente, sus anlisis de laboratorio y sus imgenes no son notables hoy, pero sospecho que sus sntomas podran deberse a BPH (hiperplasia prosttica benigna). Haga un seguimiento con su proveedor de atencin primaria sobre sus sntomas ya que afortunadamente todo parece tranquilizador hoy. Si los sntomas empeoran, regrese a Architect.  You were seen in the emergency department for increasing urinary frequency, particularly at night. Your labs and imaging are thankfully unremarkable today, but I suspect your symptoms  could be due to BPH (benign prostatic hyperplasia). Please follow up with your primary care provider regarding your symptoms as everything is thankfully looking reassuring today. If symptoms worsen, please return to the ER.

## 2022-08-10 NOTE — ED Triage Notes (Signed)
Interpretor used in triage d/t pt being Spanish speaking only. Pt caox4 and ambulatory in triage. Pt c/o increased urinary frequency with decreased output x2 weeks, and began feeling worse over the past 3 days with vomiting, headache, and chills. Afebrile at present.

## 2022-08-10 NOTE — ED Notes (Signed)
 RN reviewed discharge instructions with pt. Pt verbalized understanding and had no further questions. VSS upon discharge.  

## 2022-08-20 ENCOUNTER — Ambulatory Visit: Payer: 59 | Attending: Orthopedic Surgery

## 2022-08-20 DIAGNOSIS — M5459 Other low back pain: Secondary | ICD-10-CM | POA: Insufficient documentation

## 2022-08-20 DIAGNOSIS — M6281 Muscle weakness (generalized): Secondary | ICD-10-CM | POA: Diagnosis present

## 2022-08-20 DIAGNOSIS — R2689 Other abnormalities of gait and mobility: Secondary | ICD-10-CM | POA: Insufficient documentation

## 2022-08-20 NOTE — Therapy (Signed)
OUTPATIENT PHYSICAL THERAPY TREATMENT   Patient Name: Ethan Silva MRN: [id number] DOB:05/11/1965, 57 y.o., male Today's Date: 08/20/2022  END OF SESSION:  PT End of Session - 08/20/22 1032     Visit Number 2    Number of Visits 17    Date for PT Re-Evaluation 09/25/22    Authorization Type [place]    PT Start Time 1045    PT Stop Time 1125    PT Time Calculation (min) 40 min    Activity Tolerance Patient tolerated treatment well    Behavior During Therapy WFL for tasks assessed/performed               Past Medical History:  Diagnosis Date   Aortic stenosis, mild 07/2012   Very mild aortic stenosis noted on echo-mean gradient 11 mmHg.   Bile duct leak    CAD in native artery 07/14/2020   Small branch of OM 3 with 80% ostial stenosis.  Too small for PCI.  Otherwise questionable 30% LM and 30% PDA.   Chronic kidney disease 2017   left kidney stones   Headache(784.0)    Hx: of when BP is elevated   Hyperlipidemia    Hypertension    Non-STEMI (non-ST elevated myocardial infarction) (HCC) 07/13/2020   Admitted with hypertension and chest pain.  Cardiac cath showed small branch of an OM3 with 85% ostial stenosis not amenable to PCI.  No other significant disease noted.  Normal EF on echo.  Medical management.   Numbness and tingling in hands    Hx: of   Prediabetes    Past Surgical History:  Procedure Laterality Date   BILIARY DILATION  03/16/2021   Procedure: BILIARY DILATION;  Surgeon: Rachael Fee, MD;  Location: [facility] END[facility]COPY;  Service: Endoscopy;;   CHOLECYSTECTOMY N/A 07/11/2012   Procedure: LAPAROSCOPIC CHOLECYSTECTOMY WITH INTRAOPERATIVE CHOLANGIOGRAM;  Surgeon: Axel Filler, MD;  Location: [facility] OR;  Service: General;  Laterality: N/A;   COLONOSCOPY     ENDOSCOPIC RETROGRADE CHOLANGIOPANCREATOGRAPHY (ERCP) WITH PROPOFOL N/A 03/16/2021   Procedure: ENDOSCOPIC RETROGRADE CHOLANGIOPANCREATOGRAPHY (ERCP) WITH PROPOFOL;  Surgeon: Rachael Fee, MD;  Location: [facility];  Service: Endoscopy;  Laterality: N/A;   ERCP  07/16/2012   ERCP N/A 07/16/2012   Procedure: ENDOSCOPIC RETROGRADE CHOLANGIOPANCREATOGRAPHY (ERCP);  Surgeon: Louis Meckel, MD;  Location: [facility] OR;  Service: Gastroenterology;  Laterality: N/A;   ERCP N/A 10/06/2012   Procedure: ENDOSCOPIC RETROGRADE CHOLANGIOPANCREATOGRAPHY (ERCP);  Surgeon: Louis Meckel, MD;  Location: [facility];  Service: Endoscopy;  Laterality: N/A;   ESOPHAGOGASTRODUODENOSCOPY N/A 03/16/2021   Procedure: ESOPHAGOGASTRODUODENOSCOPY (EGD);  Surgeon: Rachael Fee, MD;  Location: [facility];  Service: Endoscopy;  Laterality: N/A;   EUS N/A 03/16/2021   Procedure: UPPER ENDOSCOPIC ULTRASOUND (EUS) RADIAL;  Surgeon: Rachael Fee, MD;  Location: [facility];  Service: Endoscopy;  Laterality: N/A;   HAND SURGERY     LEFT HEART CATH AND CORONARY ANGIOGRAPHY N/A 07/14/2020   Procedure: LEFT HEART CATH AND CORONARY ANGIOGRAPHY;  Surgeon: Lyn Records, MD;  Location: [facility] INVASIVE CV LAB;  Service: Cardiovascular; ? Culprit Lesion ~ 80% ostial small OM3 (too small & ostial for PCI). ~30% mid LM (at a bend).  LAD with D1 & D2 - normal, RCA minimal luminal irregularities ~ 30%. PDA   SPHINCTEROTOMY  03/16/2021   Procedure: SPHINCTEROTOMY;  Surgeon: Rachael Fee, MD;  Location: [facility];  Service: Endoscopy;;   TRANSTHORACIC ECHOCARDIOGRAM  07/14/2020   (NSTEMI/Accelerated Hypertension): EF 55  to 60%.  No RWM A.  Very mild Aortic Stenosis-mean gradient 11 to mmHg.   Patient Active Problem List   Diagnosis Date Noted   Mild aortic stenosis by prior echocardiogram 05/14/2022   Back pain of lumbar region with sciatica 04/16/2022   Degenerative disc disease, lumbar 04/16/2022   Lumbosacral pain 03/27/2022   Lumbosacral strain 03/21/2022   Musculoskeletal pain 03/21/2022   Chronic foot pain, right 10/30/2021   Calcaneal spur of right foot 10/30/2021   Aortic atherosclerosis  (HCC) 04/25/2021   Coronary artery disease involving native coronary artery of native heart with angina pectoris (HCC) 07/15/2020   Prediabetes 07/15/2020   BMI 30.0-30.9,adult 08/30/2015   Hyperlipidemia with target LDL less than 70 08/30/2015   Essential hypertension 10/11/2012    PCP: Georgina Quint, MD  REFERRING PROVIDER: London Sheer, MD th REFERRING DIAG: [phone] (ICD-10-CM) - Lumbar stenosis with neurogenic claudication  Rationale for Evaluation and Treatment: Rehabilitation  THERAPY DIAG:  Other low back pain  Muscle weakness (generalized)  Other abnormalities of gait and mobility  ONSET DATE: chronic per pt report, worsening a few months ago  SUBJECTIVE:                                                                                                                                                                                           SUBJECTIVE STATEMENT: Pt presents to PT with reports of continued pain, although slightly less today. Has been compliant with HEP with no adverse effect.   PERTINENT HISTORY:  HTN, CAD + angina, prediabetes  PAIN:  Are you having pain: Yes, 3/10 Location/description: low back, occurs in both LE but usually one more than the other Best-worst over past week: 0-5/10  - aggravating factors: bending forward, walking, standing, work activities, sitting for a while - Easing factors: rest, injection  PRECAUTIONS: cardiac history  WEIGHT BEARING RESTRICTIONS: none  FALLS:  Has patient fallen in last 6 months? 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 Transitional anatomy.   Please see detailed description above. 2. 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 3. Mild spinal canal  stenosis at L2-3, L3-4 and L4-5. 4. Mild to moderate bilateral neural foraminal narrowing at L3-4. 5. Moderate bilateral neural foraminal narrowing at L4-5. 6. Accessory piriform muscle bilaterally."  PATIENT SURVEYS:  FOTO: 56% function; 67% predicted  SCREENING FOR RED FLAGS: Red flag questioning/screening reassuring, MRI as above  COGNITION: Overall cognitive status: Within functional limits for tasks assessed     SENSATION: NT  MUSCLE LENGTH: NT  POSTURE: guarded trunk posture with elevated upper traps bilaterally  PALPATION: Deferred  LUMBAR ROM:   AROM eval  Flexion 50% decreased  Extension 50% decrease  Right lateral flexion   Left lateral flexion   Right rotation   Left rotation    (Blank rows = not tested) Comments: pain with raising from flexion   LOWER EXTREMITY ROM:     Active  Right eval Left eval  Hip flexion    Hip extension    Hip internal rotation    Hip external rotation    Knee extension    Knee flexion    (Blank rows = not tested) (Key: WFL = within functional limits not formally assessed, * = concordant pain, s = stiffness/stretching sensation, NT = not tested)  Comments: N/A   LOWER EXTREMITY MMT:    MMT Right eval Left eval  Hip flexion 4/5 4/5  Hip abduction (modified sitting) 4/5 4/5  Hip internal rotation    Hip external rotation    Knee flexion    Knee extension    Ankle dorsiflexion     (Blank rows = not tested) (Key: WFL = within functional limits not formally assessed, * = concordant pain, s = stiffness/stretching sensation, NT = not tested)  Comments: See chart   LUMBAR SPECIAL TESTS:  Slump: Negative SLR: Negative   FUNCTIONAL TESTS:  30 Second Sit to Stand:  5 reps   GAIT: Distance walked: within clinic Assistive device utilized: None Level of assistance: Complete Independence Comments: deferring formal assessment 07/12/22  TODAY'S  TREATMENT:                                                                                                                              [facility] Adult PT Treatment:                                                DATE: 08/20/22 Therapeutic Exercise: NuStep lvl 4 UE/LE x 5 min while taking subjective LTR x 10 DKTC 2x30" Supine PPT x 5 - 5" hold (difficult) Supine clamshell 2x15 GTB Bridge 2x10 Supine pilates SLR 2x10 each Modified thomas stretch x 60" each Lateral walk RTB x 3 laps at counter Modalities:  MHP to lumbar paraspinals during supine exercises  TREATMENT:  [facility] Adult PT Treatment:                                                DATE: 07/31/22 Therapeutic Exercise: Supine PPT x 5 - 5" hold (difficult) LTR x 5 each Supine SLR x 5 each S/L hip abd x 5 DKTC x 30" Modified thomas stretch x 30" each  PATIENT EDUCATION:  Education details: continue HEP Person educated: Patient Education method: Explanation, Demonstration, Tactile cues, Verbal cues Education comprehension: verbalized understanding, returned demonstration, verbal cues required, tactile cues required, and needs further education    HOME EXERCISE PROGRAM: Access Code: [email or web address] URL: [email or web address] Date: 07/31/2022 Prepared by: Edwinna Areola  Exercises - Supine Posterior Pelvic Tilt  - 1 x daily - 7 x weekly - 2 sets - 10 reps - 5 sec hold - Supine Lower Trunk Rotation  - 1 x daily - 7 x weekly - 2 sets - 10 reps - Active Straight Leg Raise with Quad Set  - 1 x daily - 7 x weekly - 3 sets - 10 reps - Sidelying Hip Abduction  - 1 x daily - 7 x weekly - 3 sets - 10 reps - Supine Double Knee to  Chest  - 1 x daily - 7 x weekly - 2 reps - 30 sec hold - Modified Thomas Stretch  - 1 x daily - 7 x weekly - 2 reps - 60 sec hold  ASSESSMENT:  CLINICAL IMPRESSION: Pt was able to complete all prescribed exercises with no adverse effect. Therapy focused on core strengthening and flexion-based stretches for decreasing pain and improving comfort. Continues to benefit from skilled PT services, will continue per POC.   OBJECTIVE IMPAIRMENTS: decreased activity tolerance, decreased mobility, difficulty walking, decreased ROM, decreased strength, and pain.   ACTIVITY LIMITATIONS: carrying, lifting, standing, squatting, stairs, transfers, and locomotion level  PARTICIPATION LIMITATIONS: driving, shopping, community activity, occupation, and yard work  PERSONAL FACTORS: Time since onset of injury/illness/exacerbation and 1-2 comorbidities: HTN, CAD + angina, prediabetes are also affecting patient's functional outcome.   REHAB POTENTIAL: Good  CLINICAL DECISION MAKING: Evolving/moderate complexity  EVALUATION COMPLEXITY: Moderate   GOALS: Goals reviewed with patient? No    SHORT TERM GOALS: Target date: 08/21/2022   Pt will demonstrate appropriate understanding and performance of initially prescribed HEP in order to facilitate improved independence with management of symptoms.  Baseline: HEP provided on eval Goal status: INITIAL   2.  Pt will self report lower back pain no greater than 3/10 for improved comfort and functional ability Baseline: 5/10 at worst Goal status: INITIAL    LONG TERM GOALS: Target date: 09/25/2022   Pt will score 67 on FOTO in order to demonstrate improved perception of functional status due to symptoms.  Baseline: 56 Goal status: INITIAL  2.  Pt will self report lower back pain no greater than 1/10 for improved comfort and functional ability Baseline: 5/10 at worst Goal status: INITIAL   3.  Pt will demonstrate hip MMT of 5/5 in order to demonstrate  improved strength for functional movements.  Baseline: see MMT chart Goal status: INITIAL  4.  Pt will increase 30 Second Sit to Stand rep count to no less than 7 reps for improved balance, strength, and functional mobility Baseline: 5 reps with UE Goal status: INITIAL    PLAN:  PT FREQUENCY: 2x/week  PT DURATION: 8 weeks  PLANNED INTERVENTIONS: Therapeutic exercises, Therapeutic activity, Neuromuscular re-education, Balance training, Gait training, Patient/Family education, Self Care, Joint mobilization, Dry Needling, Electrical stimulation, Cryotherapy, Moist heat, Manual therapy, and Re-evaluation  PLAN FOR NEXT SESSION: assess HEP response, core/hip strengthening, hip flexor stretching  [employer] Authorization   Choose one: Rehabilitative  Standardized Assessment or Functional Outcome Tool: FOTO  Score or Percent Disability: 56% function; 67% predicted  Body Parts Treated (Select each separately):  Lumbopelvic. Overall deficits/functional limitations for body part selected: moderate N/A. Overall deficits/functional limitations for body part selected: N/A N/A. Overall deficits/functional limitations for body part selected: N/A   If treatment provided at initial evaluation, no treatment charged due to lack of authorization.    Eloy End PT  08/20/22 11:34 AM

## 2022-08-22 ENCOUNTER — Ambulatory Visit: Payer: 59

## 2022-08-22 DIAGNOSIS — M5459 Other low back pain: Secondary | ICD-10-CM | POA: Diagnosis not present

## 2022-08-22 DIAGNOSIS — R2689 Other abnormalities of gait and mobility: Secondary | ICD-10-CM

## 2022-08-22 DIAGNOSIS — M6281 Muscle weakness (generalized): Secondary | ICD-10-CM

## 2022-08-22 NOTE — Therapy (Signed)
OUTPATIENT PHYSICAL THERAPY TREATMENT   Patient Name: Ethan Silva MRN: [id number] DOB:11/23/1965, 57 y.o., male Today's Date: 08/22/2022  END OF SESSION:  PT End of Session - 08/22/22 0951     Visit Number 3    Number of Visits 17    Date for PT Re-Evaluation 09/25/22    Authorization Type [place]    PT Start Time 1000    PT Stop Time 1038    PT Time Calculation (min) 38 min    Activity Tolerance Patient tolerated treatment well    Behavior During Therapy WFL for tasks assessed/performed                Past Medical History:  Diagnosis Date   Aortic stenosis, mild 07/2012   Very mild aortic stenosis noted on echo-mean gradient 11 mmHg.   Bile duct leak    CAD in native artery 07/14/2020   Small branch of OM 3 with 80% ostial stenosis.  Too small for PCI.  Otherwise questionable 30% LM and 30% PDA.   Chronic kidney disease 2017   left kidney stones   Headache(784.0)    Hx: of when BP is elevated   Hyperlipidemia    Hypertension    Non-STEMI (non-ST elevated myocardial infarction) (HCC) 07/13/2020   Admitted with hypertension and chest pain.  Cardiac cath showed small branch of an OM3 with 85% ostial stenosis not amenable to PCI.  No other significant disease noted.  Normal EF on echo.  Medical management.   Numbness and tingling in hands    Hx: of   Prediabetes    Past Surgical History:  Procedure Laterality Date   BILIARY DILATION  03/16/2021   Procedure: BILIARY DILATION;  Surgeon: Rachael Fee, MD;  Location: [facility];  Service: Endoscopy;;   CHOLECYSTECTOMY N/A 07/11/2012   Procedure: LAPAROSCOPIC CHOLECYSTECTOMY WITH INTRAOPERATIVE CHOLANGIOGRAM;  Surgeon: Axel Filler, MD;  Location: [facility] OR;  Service: General;  Laterality: N/A;   COLONOSCOPY     ENDOSCOPIC RETROGRADE CHOLANGIOPANCREATOGRAPHY (ERCP) WITH PROPOFOL N/A 03/16/2021   Procedure: ENDOSCOPIC RETROGRADE CHOLANGIOPANCREATOGRAPHY (ERCP) WITH PROPOFOL;  Surgeon: Rachael Fee, MD;  Location: [facility];  Service: Endoscopy;  Laterality: N/A;   ERCP  07/16/2012   ERCP N/A 07/16/2012   Procedure: ENDOSCOPIC RETROGRADE CHOLANGIOPANCREATOGRAPHY (ERCP);  Surgeon: Louis Meckel, MD;  Location: [facility] OR;  Service: Gastroenterology;  Laterality: N/A;   ERCP N/A 10/06/2012   Procedure: ENDOSCOPIC RETROGRADE CHOLANGIOPANCREATOGRAPHY (ERCP);  Surgeon: Louis Meckel, MD;  Location: [facility];  Service: Endoscopy;  Laterality: N/A;   ESOPHAGOGASTRODUODENOSCOPY N/A 03/16/2021   Procedure: ESOPHAGOGASTRODUODENOSCOPY (EGD);  Surgeon: Rachael Fee, MD;  Location: [facility];  Service: Endoscopy;  Laterality: N/A;   EUS N/A 03/16/2021   Procedure: UPPER ENDOSCOPIC ULTRASOUND (EUS) RADIAL;  Surgeon: Rachael Fee, MD;  Location: [facility];  Service: Endoscopy;  Laterality: N/A;   HAND SURGERY     LEFT HEART CATH AND CORONARY ANGIOGRAPHY N/A 07/14/2020   Procedure: LEFT HEART CATH AND CORONARY ANGIOGRAPHY;  Surgeon: Lyn Records, MD;  Location: [facility];  Service: Cardiovascular; ? Culprit Lesion ~ 80% ostial small OM3 (too small & ostial for PCI). ~30% mid LM (at a bend).  LAD with D1 & D2 - normal, RCA minimal luminal irregularities ~ 30%. PDA   SPHINCTEROTOMY  03/16/2021   Procedure: SPHINCTEROTOMY;  Surgeon: Rachael Fee, MD;  Location: [facility];  Service: Endoscopy;;   TRANSTHORACIC ECHOCARDIOGRAM  07/14/2020   (NSTEMI/Accelerated Hypertension): EF  55 to 60%.  No RWM A.  Very mild Aortic Stenosis-mean gradient 11 to mmHg.   Patient Active Problem List   Diagnosis Date Noted   Mild aortic stenosis by prior echocardiogram 05/14/2022   Back pain of lumbar region with sciatica 04/16/2022   Degenerative disc disease, lumbar 04/16/2022   Lumbosacral pain 03/27/2022   Lumbosacral strain 03/21/2022   Musculoskeletal pain 03/21/2022   Chronic foot pain, right 10/30/2021   Calcaneal spur of right foot 10/30/2021   Aortic atherosclerosis  (HCC) 04/25/2021   Coronary artery disease involving native coronary artery of native heart with angina pectoris (HCC) 07/15/2020   Prediabetes 07/15/2020   BMI 30.0-30.9,adult 08/30/2015   Hyperlipidemia with target LDL less than 70 08/30/2015   Essential hypertension 10/11/2012    PCP: Georgina Quint, MD  REFERRING PROVIDER: London Sheer, MD th REFERRING DIAG: [phone] (ICD-10-CM) - Lumbar stenosis with neurogenic claudication  Rationale for Evaluation and Treatment: Rehabilitation  THERAPY DIAG:  Other low back pain  Muscle weakness (generalized)  Other abnormalities of gait and mobility  ONSET DATE: chronic per pt report, worsening a few months ago  SUBJECTIVE:                                                                                                                                                                                           SUBJECTIVE STATEMENT: Pt presents to PT with reports of slight decrease in pain compared to last session. Has been compliant with HEP with no adverse effect.   PERTINENT HISTORY:  HTN, CAD + angina, prediabetes  PAIN:  Are you having pain: Yes, 2/10 Location/description: low back, occurs in both LE but usually one more than the other Best-worst over past week: 0-5/10  - aggravating factors: bending forward, walking, standing, work activities, sitting for a while - Easing factors: rest, injection  PRECAUTIONS: cardiac history  WEIGHT BEARING RESTRICTIONS: none  FALLS:  Has patient fallen in last 6 months? 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 Transitional  anatomy.  Please see detailed description above. 2. 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 3.  Mild spinal canal stenosis at L2-3, L3-4 and L4-5. 4. Mild to moderate bilateral neural foraminal narrowing at L3-4. 5. Moderate bilateral neural foraminal narrowing at L4-5. 6. Accessory piriform muscle bilaterally."  PATIENT SURVEYS:  FOTO: 56% function; 67% predicted  SCREENING FOR RED FLAGS: Red flag questioning/screening reassuring, MRI as above  COGNITION: Overall cognitive status: Within functional limits for tasks assessed     SENSATION: NT  MUSCLE LENGTH: NT  POSTURE: guarded trunk posture with elevated upper traps bilaterally  PALPATION: Deferred  LUMBAR ROM:   AROM eval  Flexion 50% decreased  Extension 50% decrease  Right lateral flexion   Left lateral flexion   Right rotation   Left rotation    (Blank rows = not tested) Comments: pain with raising from flexion   LOWER EXTREMITY ROM:     Active  Right eval Left eval  Hip flexion    Hip extension    Hip internal rotation    Hip external rotation    Knee extension    Knee flexion    (Blank rows = not tested) (Key: WFL = within functional limits not formally assessed, * = concordant pain, s = stiffness/stretching sensation, NT = not tested)  Comments: N/A   LOWER EXTREMITY MMT:    MMT Right eval Left eval  Hip flexion 4/5 4/5  Hip abduction (modified sitting) 4/5 4/5  Hip internal rotation    Hip external rotation    Knee flexion    Knee extension    Ankle dorsiflexion     (Blank rows = not tested) (Key: WFL = within functional limits not formally assessed, * = concordant pain, s = stiffness/stretching sensation, NT = not tested)  Comments: See chart   LUMBAR SPECIAL TESTS:  Slump: Negative SLR: Negative   FUNCTIONAL TESTS:  30 Second Sit  to Stand: 5 reps   GAIT: Distance walked: within clinic Assistive device utilized: None Level of assistance: Complete Independence Comments: deferring formal assessment 07/12/22  TODAY'S  TREATMENT:                                                                                                                              [facility] Adult PT Treatment:                                                DATE: 08/22/22 Therapeutic Exercise: NuStep lvl 5 UE/LE x 4 min while taking subjective LTR x 10 DKTC 2x30" Supine clamshell 2x15 blue band Bridge 2x10 with blue band Supine pilates SLR 2x10 each 90/90 table top 2x30" Modified thomas stretch x 60" each (PT Overpressure Lateral walk RTB x 3 laps at counter Standing hip ext 2x10 RTB Pallof press x 10 GTB each  [facility] Adult PT Treatment:  DATE: 08/20/22 Therapeutic Exercise: NuStep lvl 4 UE/LE x 5 min while taking subjective LTR x 10 DKTC 2x30" Supine PPT x 5 - 5" hold (difficult) Supine clamshell 2x15 GTB Bridge 2x10 Supine pilates SLR 2x10 each Modified thomas stretch x 60" each Lateral walk RTB x 3 laps at counter Modalities:  MHP to lumbar paraspinals during supine exercises  TREATMENT:                                                                                                                              [facility] Adult PT Treatment:                                                DATE: 07/31/22 Therapeutic Exercise: Supine PPT x 5 - 5" hold (difficult) LTR x 5 each Supine SLR x 5 each S/L hip abd x 5 DKTC x 30" Modified thomas stretch x 30" each  PATIENT EDUCATION:  Education details: continue HEP Person educated: Patient Education method: Explanation, Demonstration, Tactile cues, Verbal cues Education comprehension: verbalized understanding, returned demonstration, verbal cues required, tactile cues required, and needs further education    HOME EXERCISE PROGRAM: Access Code:  [email or web address] URL: [email or web address] Date: 07/31/2022 Prepared by: Edwinna Areola  Exercises - Supine Posterior Pelvic Tilt  - 1 x daily - 7 x weekly - 2 sets - 10 reps - 5 sec hold - Supine Lower Trunk Rotation  - 1 x daily - 7 x weekly - 2 sets - 10 reps - Active Straight Leg Raise with Quad Set  - 1 x daily - 7 x weekly - 3 sets - 10 reps - Sidelying Hip Abduction  - 1 x daily - 7 x weekly - 3 sets - 10 reps - Supine Double Knee to Chest  - 1 x daily - 7 x weekly - 2 reps - 30 sec hold - Modified Thomas Stretch  - 1 x daily - 7 x weekly - 2 reps - 60 sec hold  ASSESSMENT:  CLINICAL IMPRESSION: Pt was able to complete all prescribed exercises with no adverse effect. Therapy focused on core strengthening and flexion-based stretches for decreasing pain and improving comfort. Continues to benefit from skilled PT services, will continue per POC.   OBJECTIVE IMPAIRMENTS: decreased activity tolerance, decreased mobility, difficulty walking, decreased ROM, decreased strength, and pain.   ACTIVITY LIMITATIONS: carrying, lifting, standing, squatting, stairs, transfers, and locomotion level  PARTICIPATION LIMITATIONS: driving, shopping, community activity, occupation, and yard work  PERSONAL FACTORS: Time since onset of injury/illness/exacerbation and 1-2 comorbidities: HTN, CAD + angina, prediabetes are also affecting patient's functional outcome.    GOALS: Goals reviewed with patient? No    SHORT TERM GOALS: Target date: 08/21/2022   Pt will demonstrate appropriate understanding and performance of initially prescribed HEP in order to facilitate improved independence with  management of symptoms.  Baseline: HEP provided on eval Goal status: INITIAL   2.  Pt will self report lower back pain no greater than 3/10 for improved comfort and functional ability Baseline: 5/10 at worst Goal status: INITIAL    LONG TERM GOALS: Target date: 09/25/2022   Pt will score 67 on FOTO in  order to demonstrate improved perception of functional status due to symptoms.  Baseline: 56 Goal status: INITIAL  2.  Pt will self report lower back pain no greater than 1/10 for improved comfort and functional ability Baseline: 5/10 at worst Goal status: INITIAL   3.  Pt will demonstrate hip MMT of 5/5 in order to demonstrate improved strength for functional movements.  Baseline: see MMT chart Goal status: INITIAL  4.  Pt will increase 30 Second Sit to Stand rep count to no less than 7 reps for improved balance, strength, and functional mobility Baseline: 5 reps with UE Goal status: INITIAL    PLAN:  PT FREQUENCY: 2x/week  PT DURATION: 8 weeks  PLANNED INTERVENTIONS: Therapeutic exercises, Therapeutic activity, Neuromuscular re-education, Balance training, Gait training, Patient/Family education, Self Care, Joint mobilization, Dry Needling, Electrical stimulation, Cryotherapy, Moist heat, Manual therapy, and Re-evaluation  PLAN FOR NEXT SESSION: assess HEP response, core/hip strengthening, hip flexor stretching  [employer] Authorization   Choose one: Rehabilitative  Standardized Assessment or Functional Outcome Tool: FOTO  Score or Percent Disability: 56% function; 67% predicted  Body Parts Treated (Select each separately):  Lumbopelvic. Overall deficits/functional limitations for body part selected: moderate N/A. Overall deficits/functional limitations for body part selected: N/A N/A. Overall deficits/functional limitations for body part selected: N/A   If treatment provided at initial evaluation, no treatment charged due to lack of authorization.    Eloy End PT  08/22/22 10:38 AM

## 2022-08-27 ENCOUNTER — Ambulatory Visit: Payer: 59

## 2022-08-27 ENCOUNTER — Telehealth: Payer: Self-pay

## 2022-08-27 NOTE — Telephone Encounter (Signed)
PT had in-person interpreter call patient. He stated that he thought he hit cancel on his phone reminder but it did not go through and cancel.   Reminded him of his next appointment.   Eloy End   08/27/22 12:06 PM

## 2022-08-29 ENCOUNTER — Ambulatory Visit: Payer: 59

## 2022-08-29 DIAGNOSIS — R2689 Other abnormalities of gait and mobility: Secondary | ICD-10-CM

## 2022-08-29 DIAGNOSIS — M6281 Muscle weakness (generalized): Secondary | ICD-10-CM

## 2022-08-29 DIAGNOSIS — M5459 Other low back pain: Secondary | ICD-10-CM | POA: Diagnosis not present

## 2022-08-29 NOTE — Therapy (Signed)
OUTPATIENT PHYSICAL THERAPY TREATMENT   Patient Name: Ethan Silva MRN: [id number] DOB:01/14/1965, 57 y.o., male Today's Date: 08/29/2022  END OF SESSION:  PT End of Session - 08/29/22 1140     Visit Number 4    Number of Visits 17    Date for PT Re-Evaluation 09/25/22    Authorization Type [place] [employer]    PT Start Time 1145    PT Stop Time 1225    PT Time Calculation (min) 40 min    Activity Tolerance Patient tolerated treatment well    Behavior During Therapy WFL for tasks assessed/performed                 Past Medical History:  Diagnosis Date   Aortic stenosis, mild 07/2012   Very mild aortic stenosis noted on echo-mean gradient 11 mmHg.   Bile duct leak    CAD in native artery 07/14/2020   Small branch of OM 3 with 80% ostial stenosis.  Too small for PCI.  Otherwise questionable 30% LM and 30% PDA.   Chronic kidney disease 2017   left kidney stones   Headache(784.0)    Hx: of when BP is elevated   Hyperlipidemia    Hypertension    Non-STEMI (non-ST elevated myocardial infarction) (HCC) 07/13/2020   Admitted with hypertension and chest pain.  Cardiac cath showed small branch of an OM3 with 85% ostial stenosis not amenable to PCI.  No other significant disease noted.  Normal EF on echo.  Medical management.   Numbness and tingling in hands    Hx: of   Prediabetes    Past Surgical History:  Procedure Laterality Date   BILIARY DILATION  03/16/2021   Procedure: BILIARY DILATION;  Surgeon: Rachael Fee, MD;  Location: [facility];  Service: Endoscopy;;   CHOLECYSTECTOMY N/A 07/11/2012   Procedure: LAPAROSCOPIC CHOLECYSTECTOMY WITH INTRAOPERATIVE CHOLANGIOGRAM;  Surgeon: Axel Filler, MD;  Location: [facility] OR;  Service: General;  Laterality: N/A;   COLONOSCOPY     ENDOSCOPIC RETROGRADE CHOLANGIOPANCREATOGRAPHY (ERCP) WITH PROPOFOL N/A 03/16/2021   Procedure: ENDOSCOPIC RETROGRADE CHOLANGIOPANCREATOGRAPHY (ERCP) WITH PROPOFOL;  Surgeon:  Rachael Fee, MD;  Location: [facility];  Service: Endoscopy;  Laterality: N/A;   ERCP  07/16/2012   ERCP N/A 07/16/2012   Procedure: ENDOSCOPIC RETROGRADE CHOLANGIOPANCREATOGRAPHY (ERCP);  Surgeon: Louis Meckel, MD;  Location: [facility] OR;  Service: Gastroenterology;  Laterality: N/A;   ERCP N/A 10/06/2012   Procedure: ENDOSCOPIC RETROGRADE CHOLANGIOPANCREATOGRAPHY (ERCP);  Surgeon: Louis Meckel, MD;  Location: [facility];  Service: Endoscopy;  Laterality: N/A;   ESOPHAGOGASTRODUODENOSCOPY N/A 03/16/2021   Procedure: ESOPHAGOGASTRODUODENOSCOPY (EGD);  Surgeon: Rachael Fee, MD;  Location: [facility];  Service: Endoscopy;  Laterality: N/A;   EUS N/A 03/16/2021   Procedure: UPPER ENDOSCOPIC ULTRASOUND (EUS) RADIAL;  Surgeon: Rachael Fee, MD;  Location: [facility];  Service: Endoscopy;  Laterality: N/A;   HAND SURGERY     LEFT HEART CATH AND CORONARY ANGIOGRAPHY N/A 07/14/2020   Procedure: LEFT HEART CATH AND CORONARY ANGIOGRAPHY;  Surgeon: Lyn Records, MD;  Location: [facility];  Service: Cardiovascular; ? Culprit Lesion ~ 80% ostial small OM3 (too small & ostial for PCI). ~30% mid LM (at a bend).  LAD with D1 & D2 - normal, RCA minimal luminal irregularities ~ 30%. PDA   SPHINCTEROTOMY  03/16/2021   Procedure: SPHINCTEROTOMY;  Surgeon: Rachael Fee, MD;  Location: [facility];  Service: Endoscopy;;   TRANSTHORACIC ECHOCARDIOGRAM  07/14/2020   (NSTEMI/Accelerated Hypertension):  EF 55 to 60%.  No RWM A.  Very mild Aortic Stenosis-mean gradient 11 to mmHg.   Patient Active Problem List   Diagnosis Date Noted   Mild aortic stenosis by prior echocardiogram 05/14/2022   Back pain of lumbar region with sciatica 04/16/2022   Degenerative disc disease, lumbar 04/16/2022   Lumbosacral pain 03/27/2022   Lumbosacral strain 03/21/2022   Musculoskeletal pain 03/21/2022   Chronic foot pain, right 10/30/2021   Calcaneal spur of right foot 10/30/2021   Aortic  atherosclerosis (HCC) 04/25/2021   Coronary artery disease involving native coronary artery of native heart with angina pectoris (HCC) 07/15/2020   Prediabetes 07/15/2020   BMI 30.0-30.9,adult 08/30/2015   Hyperlipidemia with target LDL less than 70 08/30/2015   Essential hypertension 10/11/2012    PCP: Georgina Quint, MD  REFERRING PROVIDER: London Sheer, MD th REFERRING DIAG: [phone] (ICD-10-CM) - Lumbar stenosis with neurogenic claudication  Rationale for Evaluation and Treatment: Rehabilitation  THERAPY DIAG:  Other low back pain  Muscle weakness (generalized)  Other abnormalities of gait and mobility  ONSET DATE: chronic per pt report, worsening a few months ago  SUBJECTIVE:                                                                                                                                                                                           SUBJECTIVE STATEMENT: Pt presents to PT with reports of continued decrease in pain. Has been compliant with HEP.   PERTINENT HISTORY:  HTN, CAD + angina, prediabetes  PAIN:  Are you having pain: Yes, 2/10 Location/description: low back, occurs in both LE but usually one more than the other Best-worst over past week: 0-5/10  - aggravating factors: bending forward, walking, standing, work activities, sitting for a while - Easing factors: rest, injection  PRECAUTIONS: cardiac history  WEIGHT BEARING RESTRICTIONS: none  FALLS:  Has patient fallen in last 6 months? 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 Transitional anatomy.  Please see detailed  description above. 2. 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 3. Mild spinal canal stenosis at L2-3, L3-4  and L4-5. 4. Mild to moderate bilateral neural foraminal narrowing at L3-4. 5. Moderate bilateral neural foraminal narrowing at L4-5. 6. Accessory piriform muscle bilaterally."  PATIENT SURVEYS:  FOTO: 56% function; 67% predicted  SCREENING FOR RED FLAGS: Red flag questioning/screening reassuring, MRI as above  COGNITION: Overall cognitive status: Within functional limits for tasks assessed     SENSATION: NT  MUSCLE LENGTH: NT  POSTURE: guarded trunk posture with elevated upper traps bilaterally  PALPATION: Deferred  LUMBAR ROM:   AROM eval  Flexion 50% decreased  Extension 50% decrease  Right lateral flexion   Left lateral flexion   Right rotation   Left rotation    (Blank rows = not tested) Comments: pain with raising from flexion   LOWER EXTREMITY ROM:     Active  Right eval Left eval  Hip flexion    Hip extension    Hip internal rotation    Hip external rotation    Knee extension    Knee flexion    (Blank rows = not tested) (Key: WFL = within functional limits not formally assessed, * = concordant pain, s = stiffness/stretching sensation, NT = not tested)  Comments: N/A   LOWER EXTREMITY MMT:    MMT Right eval Left eval  Hip flexion 4/5 4/5  Hip abduction (modified sitting) 4/5 4/5  Hip internal rotation    Hip external rotation    Knee flexion    Knee extension    Ankle dorsiflexion     (Blank rows = not tested) (Key: WFL = within functional limits not formally assessed, * = concordant pain, s = stiffness/stretching sensation, NT = not tested)  Comments: See chart   LUMBAR SPECIAL TESTS:  Slump: Negative SLR: Negative   FUNCTIONAL TESTS:  30 Second Sit to Stand: 5 reps    GAIT: Distance walked: within clinic Assistive device utilized: None Level of assistance: Complete Independence Comments: deferring formal assessment 07/12/22  TODAY'S  TREATMENT:                                                                                                                              [facility] Adult PT Treatment:                                                DATE: 08/29/22 Therapeutic Exercise: NuStep lvl 6 UE/LE x 4 min while taking subjective LTR x 10 DKTC 2x30" Supine clamshell 2x15 black band Bridge with black band - attempted, L hamstring cramp Supine pilates SLR 2x10 each 90/90 table top 2x30" 90/90 heel taps 2x5 each Modified crunch with physioball 2x10 - 3" hold Modified thomas stretch x 60" each (PT overpressure) Lateral walk RTB x 3 laps at counter KB DL 29# to 8in step x 10 Pallof press x 10 7# each  [facility] Adult PT  Treatment:                                                DATE: 08/22/22 Therapeutic Exercise: NuStep lvl 5 UE/LE x 4 min while taking subjective LTR x 10 DKTC 2x30" Supine clamshell 2x15 blue band Bridge 2x10 with blue band Supine pilates SLR 2x10 each 90/90 table top 2x30" Modified thomas stretch x 60" each (PT Overpressure Lateral walk RTB x 3 laps at counter Standing hip ext 2x10 RTB Pallof press x 10 GTB each  [facility] Adult PT Treatment:                                                DATE: 08/20/22 Therapeutic Exercise: NuStep lvl 4 UE/LE x 5 min while taking subjective LTR x 10 DKTC 2x30" Supine PPT x 5 - 5" hold (difficult) Supine clamshell 2x15 GTB Bridge 2x10 Supine pilates SLR 2x10 each Modified thomas stretch x 60" each Lateral walk RTB x 3 laps at counter Modalities:  MHP to lumbar paraspinals during supine exercises  TREATMENT:                                                                                                                              [facility] Adult PT Treatment:                                                 DATE: 07/31/22 Therapeutic Exercise: Supine PPT x 5 - 5" hold (difficult) LTR x 5 each Supine SLR x 5 each S/L hip abd x 5 DKTC x 30" Modified thomas stretch x 30" each  PATIENT EDUCATION:  Education details: continue HEP Person educated: Patient Education method: Explanation, Demonstration, Tactile cues, Verbal cues Education comprehension: verbalized understanding, returned demonstration, verbal cues required, tactile cues required, and needs further education    HOME EXERCISE PROGRAM: Access Code: [email or web address] URL: [email or web address] Date: 07/31/2022 Prepared by: Edwinna Areola  Exercises - Supine Posterior Pelvic Tilt  - 1 x daily - 7 x weekly - 2 sets - 10 reps - 5 sec hold - Supine Lower Trunk Rotation  - 1 x daily - 7 x weekly - 2 sets - 10 reps - Active Straight Leg Raise with Quad Set  - 1 x daily - 7 x weekly - 3 sets - 10 reps - Sidelying Hip Abduction  - 1 x daily - 7 x weekly - 3 sets - 10 reps - Supine Double Knee to Chest  - 1 x daily - 7 x weekly - 2 reps - 30  sec hold - Modified Thomas Stretch  - 1 x daily - 7 x weekly - 2 reps - 60 sec hold  ASSESSMENT:  CLINICAL IMPRESSION: Pt was able to complete all prescribed exercises with no adverse effect. Therapy focused on core strengthening and flexion-based stretches for decreasing pain and improving comfort. Progressed neutral spine and standing core strengthening exercises today. Continues to benefit from skilled PT services, will continue per POC.   OBJECTIVE IMPAIRMENTS: decreased activity tolerance, decreased mobility, difficulty walking, decreased ROM, decreased strength, and pain.   ACTIVITY LIMITATIONS: carrying, lifting, standing, squatting, stairs, transfers, and locomotion level  PARTICIPATION LIMITATIONS: driving, shopping, community activity, occupation, and yard work  PERSONAL FACTORS: Time since onset of injury/illness/exacerbation and 1-2 comorbidities: HTN, CAD + angina, prediabetes  are also affecting patient's functional outcome.    GOALS: Goals reviewed with patient? No    SHORT TERM GOALS: Target date: 08/21/2022   Pt will demonstrate appropriate understanding and performance of initially prescribed HEP in order to facilitate improved independence with management of symptoms.  Baseline: HEP provided on eval Goal status: INITIAL   2.  Pt will self report lower back pain no greater than 3/10 for improved comfort and functional ability Baseline: 5/10 at worst Goal status: INITIAL    LONG TERM GOALS: Target date: 09/25/2022   Pt will score 67 on FOTO in order to demonstrate improved perception of functional status due to symptoms.  Baseline: 56 Goal status: INITIAL  2.  Pt will self report lower back pain no greater than 1/10 for improved comfort and functional ability Baseline: 5/10 at worst Goal status: INITIAL   3.  Pt will demonstrate hip MMT of 5/5 in order to demonstrate improved strength for functional movements.  Baseline: see MMT chart Goal status: INITIAL  4.  Pt will increase 30 Second Sit to Stand rep count to no less than 7 reps for improved balance, strength, and functional mobility Baseline: 5 reps with UE Goal status: INITIAL    PLAN:  PT FREQUENCY: 2x/week  PT DURATION: 8 weeks  PLANNED INTERVENTIONS: Therapeutic exercises, Therapeutic activity, Neuromuscular re-education, Balance training, Gait training, Patient/Family education, Self Care, Joint mobilization, Dry Needling, Electrical stimulation, Cryotherapy, Moist heat, Manual therapy, and Re-evaluation  PLAN FOR NEXT SESSION: assess HEP response, core/hip strengthening, hip flexor stretching  Eloy End PT  08/29/22 12:27 PM

## 2022-09-02 DIAGNOSIS — Z419 Encounter for procedure for purposes other than remedying health state, unspecified: Secondary | ICD-10-CM | POA: Diagnosis not present

## 2022-09-04 ENCOUNTER — Ambulatory Visit: Payer: 59 | Attending: Orthopedic Surgery

## 2022-09-04 DIAGNOSIS — M6281 Muscle weakness (generalized): Secondary | ICD-10-CM | POA: Insufficient documentation

## 2022-09-04 DIAGNOSIS — M5459 Other low back pain: Secondary | ICD-10-CM | POA: Insufficient documentation

## 2022-09-04 DIAGNOSIS — R2689 Other abnormalities of gait and mobility: Secondary | ICD-10-CM | POA: Insufficient documentation

## 2022-09-04 NOTE — Therapy (Addendum)
OUTPATIENT PHYSICAL THERAPY TREATMENT   Patient Name: Ethan Silva MRN: [id number] DOB:[birth year], 57 y.o., male Today's Date: 09/04/2022  END OF SESSION:  PT End of Session - 09/04/22 1136     Visit Number 5    Number of Visits 17    Date for PT Re-Evaluation 09/25/22    Authorization Type [place]    Authorization Time Period Berkley Harvey Submitted    PT Start Time 1138    PT Stop Time 1218    PT Time Calculation (min) 40 min    Activity Tolerance Patient tolerated treatment well    Behavior During Therapy [facility] for tasks assessed/performed                  Past Medical History:  Diagnosis Date   Aortic stenosis, mild 07/2012   Very mild aortic stenosis noted on echo-mean gradient 11 mmHg.   Bile duct leak    CAD in native artery 07/14/2020   Small branch of OM 3 with 80% ostial stenosis.  Too small for PCI.  Otherwise questionable 30% LM and 30% PDA.   Chronic kidney disease 2017   left kidney stones   Headache(784.0)    Hx: of when BP is elevated   Hyperlipidemia    Hypertension    Non-STEMI (non-ST elevated myocardial infarction) (HCC) 07/13/2020   Admitted with hypertension and chest pain.  Cardiac cath showed small branch of an OM3 with 85% ostial stenosis not amenable to PCI.  No other significant disease noted.  Normal EF on echo.  Medical management.   Numbness and tingling in hands    Hx: of   Prediabetes    Past Surgical History:  Procedure Laterality Date   BILIARY DILATION  03/16/2021   Procedure: BILIARY DILATION;  Surgeon: Rachael Fee, MD;  Location: [facility];  Service: Endoscopy;;   CHOLECYSTECTOMY N/A 07/11/2012   Procedure: LAPAROSCOPIC CHOLECYSTECTOMY WITH INTRAOPERATIVE CHOLANGIOGRAM;  Surgeon: Axel Filler, MD;  Location: [facility] OR;  Service: General;  Laterality: N/A;   COLONOSCOPY     ENDOSCOPIC RETROGRADE CHOLANGIOPANCREATOGRAPHY (ERCP) WITH PROPOFOL N/A 03/16/2021   Procedure: ENDOSCOPIC RETROGRADE  CHOLANGIOPANCREATOGRAPHY (ERCP) WITH PROPOFOL;  Surgeon: Rachael Fee, MD;  Location: [facility];  Service: Endoscopy;  Laterality: N/A;   ERCP  07/16/2012   ERCP N/A 07/16/2012   Procedure: ENDOSCOPIC RETROGRADE CHOLANGIOPANCREATOGRAPHY (ERCP);  Surgeon: Louis Meckel, MD;  Location: [facility] OR;  Service: Gastroenterology;  Laterality: N/A;   ERCP N/A 10/06/2012   Procedure: ENDOSCOPIC RETROGRADE CHOLANGIOPANCREATOGRAPHY (ERCP);  Surgeon: Louis Meckel, MD;  Location: [facility];  Service: Endoscopy;  Laterality: N/A;   ESOPHAGOGASTRODUODENOSCOPY N/A 03/16/2021   Procedure: ESOPHAGOGASTRODUODENOSCOPY (EGD);  Surgeon: Rachael Fee, MD;  Location: [facility];  Service: Endoscopy;  Laterality: N/A;   EUS N/A 03/16/2021   Procedure: UPPER ENDOSCOPIC ULTRASOUND (EUS) RADIAL;  Surgeon: Rachael Fee, MD;  Location: [facility];  Service: Endoscopy;  Laterality: N/A;   HAND SURGERY     LEFT HEART CATH AND CORONARY ANGIOGRAPHY N/A 07/14/2020   Procedure: LEFT HEART CATH AND CORONARY ANGIOGRAPHY;  Surgeon: Lyn Records, MD;  Location: [facility];  Service: Cardiovascular; ? Culprit Lesion ~ 80% ostial small OM3 (too small & ostial for PCI). ~30% mid LM (at a bend).  LAD with D1 & D2 - normal, RCA minimal luminal irregularities ~ 30%. PDA   SPHINCTEROTOMY  03/16/2021   Procedure: Dennison Mascot;  Surgeon: Rachael Fee, MD;  Location: [facility];  Service: Endoscopy;;  TRANSTHORACIC ECHOCARDIOGRAM  07/14/2020   (NSTEMI/Accelerated Hypertension): EF 55 to 60%.  No RWM A.  Very mild Aortic Stenosis-mean gradient 11 to mmHg.   Patient Active Problem List   Diagnosis Date Noted   Mild aortic stenosis by prior echocardiogram 05/14/2022   Back pain of lumbar region with sciatica 04/16/2022   Degenerative disc disease, lumbar 04/16/2022   Lumbosacral pain 03/27/2022   Lumbosacral strain 03/21/2022   Musculoskeletal pain 03/21/2022   Chronic foot pain, right 10/30/2021    Calcaneal spur of right foot 10/30/2021   Aortic atherosclerosis (HCC) 04/25/2021   Coronary artery disease involving native coronary artery of native heart with angina pectoris (HCC) 07/15/2020   Prediabetes 07/15/2020   BMI 30.0-30.9,adult 08/30/2015   Hyperlipidemia with target LDL less than 70 08/30/2015   Essential hypertension 10/11/2012    PCP: Georgina Quint, MD  REFERRING PROVIDER: London Sheer, MD th REFERRING DIAG: [phone] (ICD-10-CM) - Lumbar stenosis with neurogenic claudication  Rationale for Evaluation and Treatment: Rehabilitation  THERAPY DIAG:  Other low back pain  Muscle weakness (generalized)  Other abnormalities of gait and mobility  ONSET DATE: chronic per pt report, worsening a few months ago  SUBJECTIVE:                                                                                                                                                                                           SUBJECTIVE STATEMENT: Pt presents to PT with reports of increased back pain today as he returned to work last week. Has been compliant with HEP. Has follow up visit with MD tomorrow to determine if he will need to have surgery.   PERTINENT HISTORY:  HTN, CAD + angina, prediabetes  PAIN:  Are you having pain: Yes, 2/10 Location/description: low back, occurs in both LE but usually one more than the other Best-worst over past week: 0-5/10  - aggravating factors: bending forward, walking, standing, work activities, sitting for a while - Easing factors: rest, injection  PRECAUTIONS: cardiac history  WEIGHT BEARING RESTRICTIONS: none  FALLS:  Has patient fallen in last 6 months? 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, get on ladder, get down on floor  PLOF: Independent  PATIENT GOALS: help with pain  NEXT MD VISIT:  July 26th   OBJECTIVE:   PATIENT SURVEYS:  FOTO: 56% function; 67% predicted 09/04/2022: 56% function  SCREENING FOR RED FLAGS: Red flag questioning/screening reassuring, MRI as above  COGNITION: Overall cognitive  status: Within functional limits for tasks assessed     SENSATION: NT  MUSCLE LENGTH: NT  POSTURE: guarded trunk posture with elevated upper traps bilaterally  PALPATION: Deferred  LUMBAR ROM:   AROM eval  Flexion 50% decreased  Extension 50% decrease  Right lateral flexion   Left lateral flexion   Right rotation   Left rotation    (Blank rows = not tested) Comments: pain with raising from flexion   LOWER EXTREMITY ROM:     Active  Right eval Left eval  Hip flexion    Hip extension    Hip internal rotation    Hip external rotation    Knee extension    Knee flexion    (Blank rows = not tested) (Key: WFL = within functional limits not formally assessed, * = concordant pain, s = stiffness/stretching sensation, NT = not tested)  Comments: N/A   LOWER EXTREMITY MMT:    MMT Right eval Left eval  Hip flexion 4/5 4/5  Hip abduction (modified sitting) 4/5 4/5  Hip internal rotation    Hip external rotation    Knee flexion    Knee extension    Ankle dorsiflexion     (Blank rows = not tested) (Key: WFL = within functional limits not formally assessed, * = concordant pain, s = stiffness/stretching sensation, NT = not tested)  Comments: See chart   LUMBAR SPECIAL TESTS:  Slump: Negative SLR: Negative   FUNCTIONAL TESTS:  30 Second Sit to Stand: 5 reps with UE 09/04/2022: 6 reps no UE  GAIT: Distance walked: within clinic Assistive device utilized: None Level of assistance: Complete Independence Comments: deferring formal assessment 07/12/22  TODAY'S  TREATMENT:                                                                                                                              [facility] Adult PT Treatment:                                                 DATE: 09/04/22 Therapeutic Exercise: NuStep lvl 6 UE/LE x 4 min while taking subjective LTR x 10 DKTC 2x30" Supine clamshell 2x20 black band Supine pilates SLR x 15 each 90/90 table top 2x30" 90/90 heel taps 2x5 each Modified thomas stretch x 60" each (PT overpressure) Lateral walk RTB x 3 laps at counter Therapeutic Activity: Assessment of tests/measures, goals, and outcomes   [facility] Adult PT Treatment:                                                DATE: 08/29/22 Therapeutic Exercise: NuStep lvl 6 UE/LE x 4 min while taking subjective LTR x 10  DKTC 2x30" Supine clamshell 2x15 black band Bridge with black band - attempted, L hamstring cramp Supine pilates SLR 2x10 each 90/90 table top 2x30" 90/90 heel taps 2x5 each Modified crunch with physioball 2x10 - 3" hold Modified thomas stretch x 60" each (PT overpressure) Lateral walk RTB x 3 laps at counter KB DL 62# to 8in step x 10 Pallof press x 10 7# each  [facility] Adult PT Treatment:                                                DATE: 08/22/22 Therapeutic Exercise: NuStep lvl 5 UE/LE x 4 min while taking subjective LTR x 10 DKTC 2x30" Supine clamshell 2x15 blue band Bridge 2x10 with blue band Supine pilates SLR 2x10 each 90/90 table top 2x30" Modified thomas stretch x 60" each (PT Overpressure Lateral walk RTB x 3 laps at counter Standing hip ext 2x10 RTB Pallof press x 10 GTB each  [facility] Adult PT Treatment:                                                DATE: 08/20/22 Therapeutic Exercise: NuStep lvl 4 UE/LE x 5 min while taking subjective LTR x 10 DKTC 2x30" Supine PPT x 5 - 5" hold (difficult) Supine clamshell 2x15 GTB Bridge 2x10 Supine pilates SLR 2x10 each Modified thomas stretch x 60" each Lateral walk RTB x 3 laps at counter Modalities:  MHP to lumbar paraspinals during supine exercises  PATIENT EDUCATION:  Education details: continue HEP Person educated: Patient Education method: Explanation,  Demonstration, Tactile cues, Verbal cues Education comprehension: verbalized understanding, returned demonstration, verbal cues required, tactile cues required, and needs further education    HOME EXERCISE PROGRAM: Access Code: [email or web address] URL: [email or web address] Date: 07/31/2022 Prepared by: Edwinna Areola  Exercises - Supine Posterior Pelvic Tilt  - 1 x daily - 7 x weekly - 2 sets - 10 reps - 5 sec hold - Supine Lower Trunk Rotation  - 1 x daily - 7 x weekly - 2 sets - 10 reps - Active Straight Leg Raise with Quad Set  - 1 x daily - 7 x weekly - 3 sets - 10 reps - Sidelying Hip Abduction  - 1 x daily - 7 x weekly - 3 sets - 10 reps - Supine Double Knee to Chest  - 1 x daily - 7 x weekly - 2 reps - 30 sec hold - Modified Thomas Stretch  - 1 x daily - 7 x weekly - 2 reps - 60 sec hold  ASSESSMENT:  CLINICAL IMPRESSION: Pt was able to complete all prescribed exercises with no adverse effect. Therapy today continued to focus on core/hip strengthening and flexion-based stretches for decreasing lower back pain. Over the course of PT treatment he has been progressing well until recent return to work. His FOTO score remains unchanged but he has increase in functional mobility and variable pain scores. He continues to benefit from skilled PT, will continue to progress him as able depending on conclusion from his MD visit on 09/05/2022.   OBJECTIVE IMPAIRMENTS: decreased activity tolerance, decreased mobility, difficulty walking, decreased ROM, decreased strength, and pain.   ACTIVITY LIMITATIONS: carrying, lifting, standing, squatting, stairs, transfers, and locomotion level  PARTICIPATION LIMITATIONS: driving, shopping, community activity, occupation, and yard work  PERSONAL FACTORS: Time since onset of injury/illness/exacerbation and 1-2 comorbidities: HTN, CAD + angina, prediabetes are also affecting patient's functional outcome.    GOALS: Goals reviewed with patient?  No    SHORT TERM GOALS: Target date: 08/21/2022   Pt will demonstrate appropriate understanding and performance of initially prescribed HEP in order to facilitate improved independence with management of symptoms.  Baseline: HEP provided on eval Goal status:MET  2.  Pt will self report lower back pain no greater than 3/10 for improved comfort and functional ability Baseline: 5/10 at worst Goal status: IN PROGRESS    LONG TERM GOALS: Target date: 09/25/2022   Pt will score 67 on FOTO in order to demonstrate improved perception of functional status due to symptoms.  Baseline: 56% function 09/04/2022: 56% function Goal status: IN PROGRESS  2.  Pt will self report lower back pain no greater than 1/10 for improved comfort and functional ability Baseline: 5/10 at worst Goal status: IN PROGRESS   3.  Pt will demonstrate hip MMT of 5/5 in order to demonstrate improved strength for functional movements.  Baseline: see MMT chart Goal status: IN PROGRESS  4.  Pt will increase 30 Second Sit to Stand rep count to no less than 7 reps for improved balance, strength, and functional mobility Baseline: 5 reps with UE 09/04/2022: 6 reps no UE Goal status: IN PROGRESS    PLAN:  PT FREQUENCY: 2x/week  PT DURATION: 8 weeks  PLANNED INTERVENTIONS: Therapeutic exercises, Therapeutic activity, Neuromuscular re-education, Balance training, Gait training, Patient/Family education, Self Care, Joint mobilization, Dry Needling, Electrical stimulation, Cryotherapy, Moist heat, Manual therapy, and Re-evaluation  PLAN FOR NEXT SESSION: assess HEP response, core/hip strengthening, hip flexor stretching  Eloy End PT  09/04/22 1:05 PM  Check all possible CPT codes: 66440 - PT Re-evaluation, 97110- Therapeutic Exercise, [phone]- Neuro Re-education, [phone] - Gait Training, [phone] - Manual Therapy, 97530 - Therapeutic Activities, and 97535 - Self Care    Check all conditions that are expected to impact  treatment: {Conditions expected to impact treatment:Musculoskeletal disorders   If treatment provided at initial evaluation, no treatment charged due to lack of authorization.

## 2022-09-05 ENCOUNTER — Ambulatory Visit (INDEPENDENT_AMBULATORY_CARE_PROVIDER_SITE_OTHER): Payer: 59 | Admitting: Orthopedic Surgery

## 2022-09-05 DIAGNOSIS — M48062 Spinal stenosis, lumbar region with neurogenic claudication: Secondary | ICD-10-CM | POA: Diagnosis not present

## 2022-09-05 NOTE — Progress Notes (Addendum)
Orthopedic Spine Surgery Office Note   Assessment: Patient is a 57 y.o. male with low back pain that radiates into bilateral buttock and posterior thighs.  Has central and lateral recess stenosis at L4/5     Plan: -Patient has tried meloxicam, cyclobenzaprine, tramadol, Medrol Dosepak, gabapentin, lumbar steroid injection, PT -I talked about L4 and L5 segment laminectomies with partial medical facetectomies and foraminotomies as a treatment option for him since he has tried multiple conservative treatments without any relief.  I went over the risks of surgery with him including but not limited to: dural tear, infection, instability, neurologic injury, need for additional procedures, anesthetic risk, DVT/PE -Patient was not ready to decide on surgery. He wanted to talk to his family about it and then meet with me again -Will plan to see him back in 3 weeks; x-rays needed at next visit: none    ___________________________________________________________________________     History:   Patient is a 57 y.o. male who presents today for follow up on his lumbar spine. Patient has low back pain that goes into his bilateral buttock and posterior thighs. After out last visit, he has tried PT. PT was helpful temporarily but then pain returned.  He is now also reporting pain that comes on sometimes in his posterior calves.  His pain is worse when he is walking and gets better when he sits down.  Denies paresthesias and numbness.  Besides pain radiating to his calves now, there has been no change in his symptoms since our last visit.   Treatments tried: Meloxicam, cyclobenzaprine, tramadol, Medrol Dosepak, gabapentin, lumbar steroid injection, PT     Physical Exam:   General: no acute distress, appears stated age Neurologic: alert, answering questions appropriately, following commands Respiratory: unlabored breathing on room air, symmetric chest rise Psychiatric: appropriate affect, normal cadence to  speech     MSK (spine):   -Strength exam                                                   Left                  Right EHL                              5/5                  5/5 TA                                 5/5                  5/5 GSC                             5/5                  5/5 Knee extension            5/5                  5/5 Hip flexion                    5/5  5/5   -Sensory exam                           Sensation intact to light touch in L3-S1 nerve distributions of bilateral lower extremities     -Straight leg raise: Negative bilaterally -Femoral nerve stretch test: Negative bilaterally -Clonus: no beats bilaterally   Imaging: XR of the lumbar spine from 03/21/2022 and 04/25/2022 was previously independently reviewed and interpreted, showing disc height loss at L5/S1. No fracture or dislocation seen. No evidence of instability on flexion/extension views.    MRI of the lumbar spine from 05/05/2022 was previously independently reviewed and interpreted, showing central and lateral recess stenosis at L4/5.  Bilateral foraminal stenosis at L3/4 and L4/5.    Patient name: Vernice Nothdurft Patient MRN: [id number] Date of visit: 09/05/22  Entire visit was done with the assistance of an in-person spanish interpreter

## 2022-09-06 ENCOUNTER — Ambulatory Visit: Payer: 59 | Admitting: Physical Therapy

## 2022-09-06 DIAGNOSIS — M6281 Muscle weakness (generalized): Secondary | ICD-10-CM

## 2022-09-06 DIAGNOSIS — M5459 Other low back pain: Secondary | ICD-10-CM | POA: Diagnosis not present

## 2022-09-06 DIAGNOSIS — R2689 Other abnormalities of gait and mobility: Secondary | ICD-10-CM

## 2022-09-06 NOTE — Therapy (Addendum)
OUTPATIENT PHYSICAL THERAPY TREATMENT/DISCHARGE  PHYSICAL THERAPY DISCHARGE SUMMARY  Visits from Start of Care: 6  Current functional level related to goals / functional outcomes: See goals and objective   Remaining deficits: See goals and objective    Education / Equipment: HEP   Patient agrees to discharge. Patient goals were partially met. Patient is being discharged due to  patient being schedule for upcoming lumbar surgery.   Patient Name: Ethan Silva MRN: [id number] DOB:1965-04-29, 57 y.o., male Today's Date: 09/06/2022  END OF SESSION:  PT End of Session - 09/06/22 1145     Visit Number 6    Number of Visits 17    Date for PT Re-Evaluation 09/25/22    Authorization Type [place]    Authorization Time Period needs auth after 5th visit    PT Start Time 1143    PT Stop Time 1215    PT Time Calculation (min) 32 min                  Past Medical History:  Diagnosis Date   Aortic stenosis, mild 07/2012   Very mild aortic stenosis noted on echo-mean gradient 11 mmHg.   Bile duct leak    CAD in native artery 07/14/2020   Small branch of OM 3 with 80% ostial stenosis.  Too small for PCI.  Otherwise questionable 30% LM and 30% PDA.   Chronic kidney disease 2017   left kidney stones   Headache(784.0)    Hx: of when BP is elevated   Hyperlipidemia    Hypertension    Non-STEMI (non-ST elevated myocardial infarction) (HCC) 07/13/2020   Admitted with hypertension and chest pain.  Cardiac cath showed small branch of an OM3 with 85% ostial stenosis not amenable to PCI.  No other significant disease noted.  Normal EF on echo.  Medical management.   Numbness and tingling in hands    Hx: of   Prediabetes    Past Surgical History:  Procedure Laterality Date   BILIARY DILATION  03/16/2021   Procedure: BILIARY DILATION;  Surgeon: Rachael Fee, MD;  Location: [facility];  Service: Endoscopy;;   CHOLECYSTECTOMY N/A 07/11/2012   Procedure:  LAPAROSCOPIC CHOLECYSTECTOMY WITH INTRAOPERATIVE CHOLANGIOGRAM;  Surgeon: Axel Filler, MD;  Location: [facility] OR;  Service: General;  Laterality: N/A;   COLONOSCOPY     ENDOSCOPIC RETROGRADE CHOLANGIOPANCREATOGRAPHY (ERCP) WITH PROPOFOL N/A 03/16/2021   Procedure: ENDOSCOPIC RETROGRADE CHOLANGIOPANCREATOGRAPHY (ERCP) WITH PROPOFOL;  Surgeon: Rachael Fee, MD;  Location: [facility];  Service: Endoscopy;  Laterality: N/A;   ERCP  07/16/2012   ERCP N/A 07/16/2012   Procedure: ENDOSCOPIC RETROGRADE CHOLANGIOPANCREATOGRAPHY (ERCP);  Surgeon: Louis Meckel, MD;  Location: [facility] OR;  Service: Gastroenterology;  Laterality: N/A;   ERCP N/A 10/06/2012   Procedure: ENDOSCOPIC RETROGRADE CHOLANGIOPANCREATOGRAPHY (ERCP);  Surgeon: Louis Meckel, MD;  Location: [facility];  Service: Endoscopy;  Laterality: N/A;   ESOPHAGOGASTRODUODENOSCOPY N/A 03/16/2021   Procedure: ESOPHAGOGASTRODUODENOSCOPY (EGD);  Surgeon: Rachael Fee, MD;  Location: [facility];  Service: Endoscopy;  Laterality: N/A;   EUS N/A 03/16/2021   Procedure: UPPER ENDOSCOPIC ULTRASOUND (EUS) RADIAL;  Surgeon: Rachael Fee, MD;  Location: [facility];  Service: Endoscopy;  Laterality: N/A;   HAND SURGERY     LEFT HEART CATH AND CORONARY ANGIOGRAPHY N/A 07/14/2020   Procedure: LEFT HEART CATH AND CORONARY ANGIOGRAPHY;  Surgeon: Lyn Records, MD;  Location: [facility];  Service: Cardiovascular; ? Culprit Lesion ~ 80% ostial small OM3 (  too small & ostial for PCI). ~30% mid LM (at a bend).  LAD with D1 & D2 - normal, RCA minimal luminal irregularities ~ 30%. PDA   SPHINCTEROTOMY  03/16/2021   Procedure: SPHINCTEROTOMY;  Surgeon: Rachael Fee, MD;  Location: [facility];  Service: Endoscopy;;   TRANSTHORACIC ECHOCARDIOGRAM  07/14/2020   (NSTEMI/Accelerated Hypertension): EF 55 to 60%.  No RWM A.  Very mild Aortic Stenosis-mean gradient 11 to mmHg.   Patient Active Problem List   Diagnosis Date Noted   Mild aortic  stenosis by prior echocardiogram 05/14/2022   Back pain of lumbar region with sciatica 04/16/2022   Degenerative disc disease, lumbar 04/16/2022   Lumbosacral pain 03/27/2022   Lumbosacral strain 03/21/2022   Musculoskeletal pain 03/21/2022   Chronic foot pain, right 10/30/2021   Calcaneal spur of right foot 10/30/2021   Aortic atherosclerosis (HCC) 04/25/2021   Coronary artery disease involving native coronary artery of native heart with angina pectoris (HCC) 07/15/2020   Prediabetes 07/15/2020   BMI 30.0-30.9,adult 08/30/2015   Hyperlipidemia with target LDL less than 70 08/30/2015   Essential hypertension 10/11/2012    PCP: Georgina Quint, MD  REFERRING PROVIDER: London Sheer, MD th REFERRING DIAG: [phone] (ICD-10-CM) - Lumbar stenosis with neurogenic claudication  Rationale for Evaluation and Treatment: Rehabilitation  THERAPY DIAG:  Other low back pain  Muscle weakness (generalized)  Other abnormalities of gait and mobility  ONSET DATE: chronic per pt report, worsening a few months ago  SUBJECTIVE:                                                                                                                                                                                           SUBJECTIVE STATEMENT: Pain is 4/10 in back and legs. Saw MD and I will have surgery but I have another appt with him so he can explain the surgery to my family first. I will continue to work until surgery.     PERTINENT HISTORY:  HTN, CAD + angina, prediabetes  PAIN:  Are you having pain: Yes, 4/10 Location/description: low back, occurs in both LE but usually one more than the other Best-worst over past week: 0-5/10  - aggravating factors: bending forward, walking, standing, work activities, sitting for a while - Easing factors: rest, injection  PRECAUTIONS: cardiac history  WEIGHT BEARING RESTRICTIONS: none  FALLS:  Has patient fallen in last 6 months? 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, get on  ladder, get down on floor  PLOF: Independent  PATIENT GOALS: help with pain  NEXT MD VISIT: July 26th   OBJECTIVE:   PATIENT SURVEYS:  FOTO: 56% function; 67% predicted 09/04/2022: 56% function  SCREENING FOR RED FLAGS: Red flag questioning/screening reassuring, MRI as above  COGNITION: Overall cognitive status: Within functional limits for tasks assessed     SENSATION: NT  MUSCLE LENGTH: NT  POSTURE: guarded trunk posture with elevated upper traps bilaterally  PALPATION: Deferred  LUMBAR ROM:   AROM eval  Flexion 50% decreased  Extension 50% decrease  Right lateral flexion   Left lateral flexion   Right rotation   Left rotation    (Blank rows = not tested) Comments: pain with raising from flexion   LOWER EXTREMITY ROM:     Active  Right eval Left eval  Hip flexion    Hip extension    Hip internal rotation    Hip external rotation    Knee extension    Knee flexion    (Blank rows = not tested) (Key: WFL = within functional limits not formally assessed, * = concordant pain, s = stiffness/stretching sensation, NT = not tested)  Comments: N/A   LOWER EXTREMITY MMT:    MMT Right eval Left eval Right 09/06/22 Left 09/06/22  Hip flexion 4/5 4/5 5/5 5/5  Hip abduction (modified sitting) 4/5 4/5 5/5 5/5  Hip internal rotation      Hip external rotation      Knee flexion      Knee extension      Ankle dorsiflexion       (Blank rows = not tested) (Key: WFL = within functional limits not formally assessed, * = concordant pain, s = stiffness/stretching sensation, NT = not tested)  Comments: See chart   LUMBAR SPECIAL TESTS:  Slump: Negative SLR: Negative   FUNCTIONAL TESTS:  30 Second Sit to Stand: 5 reps with UE 09/04/2022: 6 reps no UE  GAIT: Distance walked: within  clinic Assistive device utilized: None Level of assistance: Complete Independence Comments: deferring formal assessment 07/12/22  TODAY'S  TREATMENT:                                                                                                                              [facility] Adult PT Treatment:                                                DATE: 09/06/22 Therapeutic Exercise: Review of HEP   [facility] Adult PT Treatment:                                                DATE: 09/04/22 Therapeutic  Exercise: NuStep lvl 6 UE/LE x 4 min while taking subjective LTR x 10 DKTC 2x30" Supine clamshell 2x20 black band Supine pilates SLR x 15 each 90/90 table top 2x30" 90/90 heel taps 2x5 each Modified thomas stretch x 60" each (PT overpressure) Lateral walk RTB x 3 laps at counter Therapeutic Activity: Assessment of tests/measures, goals, and outcomes   [facility] Adult PT Treatment:                                                DATE: 08/29/22 Therapeutic Exercise: NuStep lvl 6 UE/LE x 4 min while taking subjective LTR x 10 DKTC 2x30" Supine clamshell 2x15 black band Bridge with black band - attempted, L hamstring cramp Supine pilates SLR 2x10 each 90/90 table top 2x30" 90/90 heel taps 2x5 each Modified crunch with physioball 2x10 - 3" hold Modified thomas stretch x 60" each (PT overpressure) Lateral walk RTB x 3 laps at counter KB DL 16# to 8in step x 10 Pallof press x 10 7# each  [facility] Adult PT Treatment:                                                DATE: 08/22/22 Therapeutic Exercise: NuStep lvl 5 UE/LE x 4 min while taking subjective LTR x 10 DKTC 2x30" Supine clamshell 2x15 blue band Bridge 2x10 with blue band Supine pilates SLR 2x10 each 90/90 table top 2x30" Modified thomas stretch x 60" each (PT Overpressure Lateral walk RTB x 3 laps at counter Standing hip ext 2x10 RTB Pallof press x 10 GTB each  [facility] Adult PT Treatment:                                                DATE:  08/20/22 Therapeutic Exercise: NuStep lvl 4 UE/LE x 5 min while taking subjective LTR x 10 DKTC 2x30" Supine PPT x 5 - 5" hold (difficult) Supine clamshell 2x15 GTB Bridge 2x10 Supine pilates SLR 2x10 each Modified thomas stretch x 60" each Lateral walk RTB x 3 laps at counter Modalities:  MHP to lumbar paraspinals during supine exercises  PATIENT EDUCATION:  Education details: continue HEP Person educated: Patient Education method: Explanation, Demonstration, Tactile cues, Verbal cues Education comprehension: verbalized understanding, returned demonstration, verbal cues required, tactile cues required, and needs further education    HOME EXERCISE PROGRAM: Access Code: [email or web address] URL: [email or web address] Date: 07/31/2022 Prepared by: Edwinna Areola  Exercises - Supine Posterior Pelvic Tilt  - 1 x daily - 7 x weekly - 2 sets - 10 reps - 5 sec hold - Supine Lower Trunk Rotation  - 1 x daily - 7 x weekly - 2 sets - 10 reps - Active Straight Leg Raise with Quad Set  - 1 x daily - 7 x weekly - 3 sets - 10 reps - Sidelying Hip Abduction  - 1 x daily - 7 x weekly - 3 sets - 10 reps - Supine Double Knee to Chest  - 1 x daily - 7 x weekly - 2 reps - 30 sec hold - Modified Thomas Stretch  - 1 x daily -  7 x weekly - 2 reps - 60 sec hold  ASSESSMENT:  CLINICAL IMPRESSION: MD has recommended lumbar surgery and patient will be scheduled soon. Pt was able to complete all prescribed exercises with no adverse effect. Therapy today reviewed HEP and pt demonstrates independence. He will continue HEP until Lumbar surgery.  See Goal section. Most goals not met due to pain and limitations.    OBJECTIVE IMPAIRMENTS: decreased activity tolerance, decreased mobility, difficulty walking, decreased ROM, decreased strength, and pain.   ACTIVITY LIMITATIONS: carrying, lifting, standing, squatting, stairs, transfers, and locomotion level  PARTICIPATION LIMITATIONS: driving, shopping,  community activity, occupation, and yard work  PERSONAL FACTORS: Time since onset of injury/illness/exacerbation and 1-2 comorbidities: HTN, CAD + angina, prediabetes are also affecting patient's functional outcome.    GOALS: Goals reviewed with patient? No    SHORT TERM GOALS: Target date: 08/21/2022   Pt will demonstrate appropriate understanding and performance of initially prescribed HEP in order to facilitate improved independence with management of symptoms.  Baseline: HEP provided on eval Goal status:MET  2.  Pt will self report lower back pain no greater than 3/10 for improved comfort and functional ability Baseline: 5/10 at worst Goal status: NOT MET    LONG TERM GOALS: Target date: 09/25/2022   Pt will score 67 on FOTO in order to demonstrate improved perception of functional status due to symptoms.  Baseline: 56% function 09/04/2022: 56% function Goal status: NOT MET  2.  Pt will self report lower back pain no greater than 1/10 for improved comfort and functional ability Baseline: 5/10 at worst Goal status: NOT MET   3.  Pt will demonstrate hip MMT of 5/5 in order to demonstrate improved strength for functional movements.  Baseline: see MMT chart Goal status: MET  4.  Pt will increase 30 Second Sit to Stand rep count to no less than 7 reps for improved balance, strength, and functional mobility Baseline: 5 reps with UE 09/04/2022: 6 reps no UE Goal status: NOT MET    PLAN:  PT FREQUENCY: 2x/week  PT DURATION: 8 weeks  PLANNED INTERVENTIONS: Therapeutic exercises, Therapeutic activity, Neuromuscular re-education, Balance training, Gait training, Patient/Family education, Self Care, Joint mobilization, Dry Needling, Electrical stimulation, Cryotherapy, Moist heat, Manual therapy, and Re-evaluation  PLAN FOR NEXT SESSION: N/A DC to HEP and surgery  Royden Purl PTA  09/06/22 12:37 PM  Check all possible CPT codes: 96045 - PT Re-evaluation, 97110-  Therapeutic Exercise, [phone]- Neuro Re-education, [phone] - Gait Training, [phone] - Manual Therapy, 97530 - Therapeutic Activities, and 97535 - Self Care    Check all conditions that are expected to impact treatment: {Conditions expected to impact treatment:Musculoskeletal disorders   If treatment provided at initial evaluation, no treatment charged due to lack of authorization.

## 2022-09-10 ENCOUNTER — Ambulatory Visit: Payer: 59 | Attending: Cardiovascular Disease | Admitting: Pharmacist Clinician (PhC)/ Clinical Pharmacy Specialist

## 2022-09-10 ENCOUNTER — Encounter: Payer: Self-pay | Admitting: Pharmacist Clinician (PhC)/ Clinical Pharmacy Specialist

## 2022-09-10 VITALS — BP 139/86 | HR 58

## 2022-09-10 DIAGNOSIS — I10 Essential (primary) hypertension: Secondary | ICD-10-CM | POA: Diagnosis not present

## 2022-09-10 MED ORDER — NITROGLYCERIN 0.4 MG SL SUBL: 0.4000 mg | SUBLINGUAL_TABLET | SUBLINGUAL | 6 refills | Status: DC | PRN

## 2022-09-10 MED ORDER — SPIRONOLACTONE 25 MG PO TABS: 12.5000 mg | ORAL_TABLET | Freq: Every day | ORAL | 3 refills | Status: DC

## 2022-09-10 NOTE — Patient Instructions (Signed)
Follow up appointment: 14 de Octobre 8:30 am  Go to the lab today  Take your BP meds as follows  Tome spironolactone 12.5 mg (1/2 pastilla) cada dia  Check your blood pressure at home daily (if able) and keep record of the readings.  Hypertension "High blood pressure"  Hypertension is often called "The Silent Killer." It rarely causes symptoms until it is extremely  high or has done damage to other organs in the body. For this reason, you should have your  blood pressure checked regularly by your physician. We will check your blood pressure  every time you see a provider at one of our offices.   Your blood pressure reading consists of two numbers. Ideally, blood pressure should be  below 120/80. The first ("top") number is called the systolic pressure. It measures the  pressure in your arteries as your heart beats. The second ("bottom") number is called the diastolic pressure. It measures the pressure in your arteries as the heart relaxes between beats.  The benefits of getting your blood pressure under control are enormous. A 10-point  reduction in systolic blood pressure can reduce your risk of stroke by 27% and heart failure by 28%  Your blood pressure goal is 130/80  To check your pressure at home you will need to:  1. Sit up in a chair, with feet flat on the floor and back supported. Do not cross your ankles or legs. 2. Rest your left arm so that the cuff is about heart level. If the cuff goes on your upper arm,  then just relax the arm on the table, arm of the chair or your lap. If you have a wrist cuff, we  suggest relaxing your wrist against your chest (think of it as Pledging the Flag with the  wrong arm).  3. Place the cuff snugly around your arm, about 1 inch above the crook of your elbow. The  cords should be inside the groove of your elbow.  4. Sit quietly, with the cuff in place, for about 5 minutes. After that 5 minutes press the power  button to start a  reading. 5. Do not talk or move while the reading is taking place.  6. Record your readings on a sheet of paper. Although most cuffs have a memory, it is often  easier to see a pattern developing when the numbers are all in front of you.  7. You can repeat the reading after 1-3 minutes if it is recommended  Make sure your bladder is empty and you have not had caffeine or tobacco within the last 30 min  Always bring your blood pressure log with you to your appointments. If you have not brought your monitor in to be double checked for accuracy, please bring it to your next appointment.  You can find a list of quality blood pressure cuffs at validatebp.org

## 2022-09-10 NOTE — Assessment & Plan Note (Signed)
Assessment: BP is uncontrolled in office BP 139/86 mmHg;  above the goal (<130/80). Tolerates current medications well without any side effects Did show some hypokalemia at ED visit last month (K 3.3) Denies SOB, palpitation, chest pain, headaches,or swelling Reiterated the importance of regular exercise and low salt diet   Plan:  Start taking spironolactone 12.5 mg once daily Continue taking all other medications Patient to keep record of BP readings with heart rate and report to [place] at the next visit Patient to follow up with PharmD in 6 weeks  Labs ordered today:  BMET today

## 2022-09-10 NOTE — Progress Notes (Signed)
Office Visit    Patient Name: Ethan Silva Date of Encounter: 09/10/2022  Primary Care Provider:  Georgina Quint, MD Primary Cardiologist:  Bryan Lemma, MD  Chief Complaint    Hyperlipidemia, hypertension  Significant Past Medical History   HTN Amlodipine increased to 10 mg at last visit, also lisinopril  CAD 7/22 cath - 80% 3rd OM, mid LAD 30-40%, prox RCA 30%  preDM 5/24 A1c 6.3     No Known Allergies  History of Present Illness    Ethan Silva is a 57 y.o. male patient of Dr Herbie Baltimore, in the office today to discuss options for managing cholesterol as well as hypertension.   He comes with a [place] interpreter.  He saw Dr. Herbie Baltimore last month, BP was at 154/98 and amlodipine was increased to 10 mg.   He states he has checked home readings several times and that they are still running higher than goal.  For his cholesterol he was prescribed rosuvastatin and Nexlizet.  From conversation it sounds like he was only taking the Nexlizet, but it became cost prohibitive and he then re-started rosuvastatin.  He has been on that for only 3 days now.    At my last visit with him in July, pressure was better at 135/94.  Chlorthalidone 25 mg daily was added to his lisinopril and amlodipine.    Today he returns for follow up.  States he has been having back problems and is working with orthopedics and Careers adviser for possible back surgery.    Insurance Carrier: Higher education careers adviser preferred - Tier 4, [place], 28 day/fill limit)  LDL Cholesterol goal:  LDL < 70  BP goal:  < 130/80  Current Medications:   rosuvastatin 40 mg   Amlodipine 10 mg qd, (pm) lisinopril 40 mg every day (am), chlorthalidone 25 mg every day   Family Hx:  mother with high BP, not well controlled; father deceased - gall bladder surgery complications; 1 brother with hypertension; youngest has BP and palpitations   Social Hx: Tobacco:  no Alcohol:  no   Caffeine: only mint tea on  occasion  Diet:   mostly ome cooked meals; grilled chicken, or makes broth with vegetables; doesn't snack much, but occasional crackers   Exercise: on his feet, moving around all day - works as a Education administrator  BP readings: Brought home cuff ([place]), about 59 year old.  Read within 10 points of office reading  21 AM readings average 135/91 HR 65  (range 117-154/80-101)  prev average 136/90  19 PM readings average 135/85 HR 67  (range 119-150/74-94)  prev average 134/91  Accessory Clinical Findings   Lab Results  Component Value Date   CHOL 156 07/19/2022   HDL 41 07/19/2022   LDLCALC 94 07/19/2022   TRIG 113 07/19/2022   CHOLHDL 3.8 07/19/2022    Lipoprotein (a)  Date/Time Value Ref Range Status  05/14/2022 09:20 AM 38.0 <75.0 nmol/L Final    Comment:    Note:  Values greater than or equal to 75.0 nmol/L may        indicate an independent risk factor for CHD,        but must be evaluated with caution when applied        to non-Caucasian populations due to the        influence of genetic factors on Lp(a) across        ethnicities.     Lab Results  Component Value Date   ALT  39 08/10/2022   AST 33 08/10/2022   ALKPHOS 43 08/10/2022   BILITOT 0.5 08/10/2022   Lab Results  Component Value Date   CREATININE 1.12 08/13/2022   BUN 21 08/13/2022   NA 142 08/13/2022   K 3.3 (L) 08/13/2022   CL 99 08/13/2022   CO2 29 08/13/2022   Lab Results  Component Value Date   HGBA1C 6.3 (H) 05/14/2022    Home Medications    Current Outpatient Medications  Medication Sig Dispense Refill   spironolactone (ALDACTONE) 25 MG tablet Take 0.5 tablets (12.5 mg total) by mouth daily. 45 tablet 3   acetaminophen (TYLENOL) 325 MG tablet Take 325 mg by mouth every 6 (six) hours as needed for moderate pain.     amLODipine (NORVASC) 10 MG tablet Take 1 tablet (10 mg total) by mouth daily. 90 tablet 3   aspirin 81 MG EC tablet Take 1 tablet (81 mg total) by mouth daily. Swallow whole. 90 tablet 3    chlorthalidone (HYGROTON) 25 MG tablet Take 1 tablet (25 mg total) by mouth daily. 90 tablet 3   ciprofloxacin (CIPRO) 500 MG tablet Take 1 tablet (500 mg total) by mouth 2 (two) times daily. 14 tablet 0   gabapentin (NEURONTIN) 300 MG capsule Take 1 capsule (300 mg total) by mouth 3 (three) times daily. 90 capsule 0   lisinopril (ZESTRIL) 40 MG tablet Take 1 tablet (40 mg total) by mouth daily. 90 tablet 3   nitroGLYCERIN (NITROSTAT) 0.4 MG SL tablet Place 1 tablet (0.4 mg total) under the tongue every 5 (five) minutes x 3 doses as needed for chest pain. 25 tablet 6   pantoprazole (PROTONIX) 40 MG tablet Take 1 tablet by mouth once daily 90 tablet 3   polyethylene glycol powder (GLYCOLAX/MIRALAX) 17 GM/SCOOP powder Take 17 g by mouth 2 (two) times daily as needed. 3350 g 1   rosuvastatin (CRESTOR) 40 MG tablet Take 1 tablet (40 mg total) by mouth daily. 90 tablet 3   No current facility-administered medications for this visit.         Assessment & Plan    Essential hypertension Assessment: BP is uncontrolled in office BP 139/86 mmHg;  above the goal (<130/80). Tolerates current medications well without any side effects Did show some hypokalemia at ED visit last month (K 3.3) Denies SOB, palpitation, chest pain, headaches,or swelling Reiterated the importance of regular exercise and low salt diet   Plan:  Start taking spironolactone 12.5 mg once daily Continue taking all other medications Patient to keep record of BP readings with heart rate and report to [place] at the next visit Patient to follow up with PharmD in 6 weeks  Labs ordered today:  BMET today   Phillips Hay PharmD CPP [facility]  [address] [place], [place] [address] [phone]

## 2022-09-11 LAB — BASIC METABOLIC PANEL
BUN/Creatinine Ratio: 25 — ABNORMAL HIGH (ref 9–20)
BUN: 21 mg/dL (ref 6–24)
CO2: 27 mmol/L (ref 20–29)
Calcium: 8.9 mg/dL (ref 8.7–10.2)
Chloride: 100 mmol/L (ref 96–106)
Creatinine, Ser: 0.85 mg/dL (ref 0.76–1.27)
Glucose: 103 mg/dL — ABNORMAL HIGH (ref 70–99)
Potassium: 3.3 mmol/L — ABNORMAL LOW (ref 3.5–5.2)
Sodium: 142 mmol/L (ref 134–144)
eGFR: 100 mL/min/{1.73_m2} (ref >59)

## 2022-09-18 ENCOUNTER — Telehealth: Payer: Self-pay | Admitting: *Deleted

## 2022-09-18 NOTE — Telephone Encounter (Signed)
-----   Message from Bryan Lemma sent at 09/16/2022  6:40 PM EDT ----- Follow-up chemistry panel shows stable kidney function.  Potassium level actually improved with the addition of spironolactone.  Okay to continue spironolactone.  Bryan Lemma, MD

## 2022-09-18 NOTE — Telephone Encounter (Signed)
Called used [place] interpreter [place] # [phone].  Left detail message of voicemail the lab results --  continue current medications and keep appt in Oct. 2024 . Any question may call office

## 2022-09-27 ENCOUNTER — Ambulatory Visit: Payer: 59 | Admitting: Orthopedic Surgery

## 2022-10-02 ENCOUNTER — Telehealth: Payer: Self-pay | Admitting: Orthopedic Surgery

## 2022-10-02 DIAGNOSIS — Z419 Encounter for procedure for purposes other than remedying health state, unspecified: Secondary | ICD-10-CM | POA: Diagnosis not present

## 2022-10-02 NOTE — Telephone Encounter (Signed)
Copied xrays done here in our office to a CD and gave to patient. Patient asked about MRI, I advised he would need to get that from the facility where he had that done, patient expressed understanding.

## 2022-10-02 NOTE — Telephone Encounter (Signed)
Patient came in wanting his Xray's. CB#[phone]

## 2022-10-15 ENCOUNTER — Ambulatory Visit: Payer: 59 | Attending: Cardiology | Admitting: Student

## 2022-10-15 ENCOUNTER — Encounter: Payer: Self-pay | Admitting: Student

## 2022-10-15 VITALS — BP 112/73 | HR 65 | Ht 63.0 in | Wt 167.8 lb

## 2022-10-15 DIAGNOSIS — I10 Essential (primary) hypertension: Secondary | ICD-10-CM | POA: Diagnosis not present

## 2022-10-15 NOTE — Assessment & Plan Note (Addendum)
Assessment: BP is uncontrolled in office BP 112/73 mmHg; heart rate 65 goal (<130/80). Tolerates current medications well without any side effects Home BP morning BP ~125-85 and evening 135/89. Patient reports he suffers from back pain and after work in the evening he is usually tired and in pain that is why his BP elevates in the evening. From last 5 days it is ~145-150/89-90 range due to pain  Takes Chlorthalidone at night - leading to nocturia  Denies SOB, palpitation, chest pain, headaches,or swelling Reiterated the importance of regular exercise and low salt diet   Plan:  Start taking chlorthalidone in the morning and switch amlodipine 10 mg at night  Continue taking all other medications (lisinopril 40 mg QAM and spironolactone 12.5 mg QAM)  Start doing regular exercise and focus on healthy high fiber, low fat, low carb, low sodium diet  Patient to keep record of BP readings with heart rate and report to [place] at the next visit Patient to follow up with PharmD in 8 weeks  Labs ordered today: BMP today

## 2022-10-15 NOTE — Patient Instructions (Addendum)
Changes made by your pharmacist Carmela Hurt, PharmD at today's visit:    Instructions/Changes  (what do you need to do) Your Notes  (what you did and when you did it)  Start taking amlodipine in PM and chlorthalidone in AM   Continue taking other BP medications same as before    Continue doing regular exercise       Important lifestyle changes to control high blood pressure  Intervention  Effect on the BP  Lose extra pounds and watch your waistline Weight loss is one of the most effective lifestyle changes for controlling blood pressure. If you're overweight or obese, losing even a small amount of weight can help reduce blood pressure. Blood pressure might go down by about 1 millimeter of mercury (mm Hg) with each kilogram (about 2.2 pounds) of weight lost.  Exercise regularly As a general goal, aim for at least 30 minutes of moderate physical activity every day. Regular physical activity can lower high blood pressure by about 5 to 8 mm Hg.  Eat a healthy diet Eating a diet rich in whole grains, fruits, vegetables, and low-fat dairy products and low in saturated fat and cholesterol. A healthy diet can lower high blood pressure by up to 11 mm Hg.  Reduce salt (sodium) in your diet Even a small reduction of sodium in the diet can improve heart health and reduce high blood pressure by about 5 to 6 mm Hg.  Limit alcohol One drink equals 12 ounces of beer, 5 ounces of wine, or 1.5 ounces of 80-proof liquor.  Limiting alcohol to less than one drink a day for women or two drinks a day for men can help lower blood pressure by about 4 mm Hg.   If you have any questions or concerns please use My Chart to send questions or call the office at [phone]   Tackling Obesity with Lifestyle Changes Obesity- What is it? And What can we do about it? Obesity is a chronic complex disease defined as excessive fat deposits that can have a negative effect on our health. It can lead to many other  diseases including type 2 diabetes. Weight gain occurs when the amount of energy (calories) we consume is greater than the amount we use.  When our energy output is greater than our energy input we lose weight. The basic concept is simple, but in reality, it's much more complicated.  Types of Energy Expenditure  Basal Metabolic Rate (BMR) Energy that our bodies use to preform everyday tasks. More muscle mass through resistance training can increase this a small amount  Thermic Effect of Food The amount of energy that it takes to breakdown the food we eat. This will be highest when we eat protein and fiber rich foods   Exercise Energy Expenditure  The amount of energy used during formal exercise (walking, biking, weightlifting)  Non-exercise activity thermogenesis (NEAT) The amount of energy spent on activities that are not formal exercise (standing, fidgeting). Therefore, it is not only important to do formal exercise but also move around throughout the day.   Unfortunately, in some people, our bodies have many ways it can compensate when we try to eat less and move more which can prevent [place] from changing our weight. This can lead to some people having a much more difficult time losing weight even when they put healthy habits into practice. This can be frustrating. We want to focus on healthy habits, physical activity and how we feel, and less the number  on the scale. Food As Energy Calories Calories is just a unit of measurement for energy. Counting calories is not required to lose weight but counting for a short period of time can:  help you learn good portion sizes  Learn what your true energy needs are.   Help you be more aware of your snacking or grazing habits  To help calculate how many calories you should be eating, the NIH has a great body weight planner calculator at BeverageBuggy.si Managing The Meal Macro nutrients (carbohydrates, fats and protein, fiber, water)  Micronutrients  (vitamins, minerals)  Dietary Fiber   Benefits   Examples   Cautions   Soluble fiber  Decreases cholesterol improve blood sugar control,  Feeds our gut bacteria Allows [place] to feel fuller for longer so we eat less fruits oats barley legumes peas Beans vegetables (broccoli) and root vegetables (carrots) Add fiber into your diet slowly and be sure to drink at least 8 cups of water a day. This will help limit gas, bloating, diarrhea, or constipation.  Insoluble Fiber Improves digestive health by making stool easier to pass Allows [place] to feel fuller for longer so we eat less whole grains  nuts seeds skin of fruit vegetables (green beans, zucchini, cauliflower)    Tricks to add more fiber to your diet Add beans (pinto, kidney, lima, navy and garbanzo) to salads, ground meat or brown rice Add nuts or seeds and or fresh/frozen fruit to yogurt, cottage cheese, salads or steel cut oats Cut up vegetables and eat with hummus Look for unsweetened whole grain cereals with at least 5g of fiber per serving  Switch to whole grain bread. Look for bread that has whole grain flour as the first ingredient and has more fiber than carbs if you were to multiple the fiber x 10.  Try bulgar, barely, quinoa, buckwheat, brown rice wild rice instead of white rice Keep frozen vegetables on hand to add to dishes or soups     Meal Planning: Meal planning is the key to setting you up for success. Here are some examples of healthy meal options. Breakfast   Option 1:  Omelette with vegetables (1 egg, spinach, mushrooms, or other vegetable of your choice), 2 slices whole-grain toast, tip of thumb size butter or soft margarine,  cup low-fat milk or yogurt  Option 2: steel-cut rolled oats (? cup dry), 1 tbsp peanut butter added to cooked oats,  cup low-fat milk. Option 3: 2 slices whole-grain or rye toast with avocado spread ( small avocado mased with herbs and pepper to taste), 1 poached egg or [place] up  (cooked to your liking) Option 4:  cup plain 0% [place] yogurt topped with  cup berries and  cup walnuts or almonds, 2 slices whole-grain or rye toast, tip of thumb size soft margarine/butter Lunch:  Option 1: 2 cups red lentil soup, green salad with 1 tbsp homemade vinaigrette (extra virgin olive oil and vinegar of choice plus spices) Option 2: 3 oz. roasted chicken, 2 slices whole-grain bread, 2 tsp mayonnaise, mustard, lettuce, tomato if desired, 1 fruit (example: medium-sized apple or small pear) Option 3: 3 oz. tuna packed in water, 1 whole-wheat pita (6 inch), 2 tsp mayonnaise, lettuce, tomato, or other non-starchy vegetable of your choice, 1 fruit (example: medium-sized apple or small pear) Option 4: 1 serving of garden veggie buddha bowl with lentils and tahini sauce and 1 cup berries topped with  cup plain 0% Greek yogurt Dinner:  Option 1: 1 serving roasted  cauliflower salad, 3-4 oz.  grilled or baked pork loin chop, 1/2 cup mashed potato, or brown rice or quinoa  Option 2: 1 serving fish (baked, grilled or air fried), green salad, 1 tbsp homemade vinaigrette,  cup cooked couscous Option 3: 1 cup cooked whole grained pasta (example: spaghetti, spirals, macaroni),  cup favorite pasta sauce (preferably homemade), 3-4 oz.  grilled or baked chicken, green salad, 1 tbsp homemade vinaigrette Option 4: 1 serving oven roasted salmon,  cup mashed sweet potato or couscous or brown rice or quinoa, broccoli (steamed or roasted) Healthy snacks:  Carrots or celery with 1 tbsp of hummus  1 medium-sized fruit (apple or orange) 1 cup plain 0% [place] yogurt with  cup berries Half apple, sliced, with 1 tbsp (15 mL) peanut or almond butter Dining out:  Eating away from home has become a part of many people's lifestyle. Making healthy choices when you are eating out is important too. Portion size is an important part of healthy choices. Most branded fast-food places provide calories, sodium, and fat  content for their menu items. www.calorieking.com would be great resource to find nutrition facts for your favorite brands and fast-food restaurants. Company specific website can be Chief Technology Officer for nutrition information for their items. (e.g. www.mcdonalds.com or www.nutritionix.com/[place]/menu/premium)  Here are some tips to help you make wise food choices when you are dining out.  Chose more often  Avoid   Beverages  Water, low fat milk  Sugar-free/diet drinks  Unsweet tea or coffee Milkshakes, fruit drinks, regular pop Alcohol, specialty drinks (e.g. iced cappuccino)  Fast food Garden salad Mini subs, pita sandwiches ect with extra vegetables plain burgers, grilled chicken Vegetarian or cheese pizza with whole-grain crust Burgers/sandwiches with bacon, cheese, and high-fat sauces [place] fries, fried chicken, fried fish, poutine, hash browns Pizza with processed meats  Starters Raw vegetables, salads (garden, spinach, fruit) clear or vegetable soups Seafood cocktail Whole-grain breads and rolls Salads with high-fat dressings or toppings   Creamy soups  Wings, egg rolls  onion rings, nachos  White or garlic bread  Main courses Grains & Starches (amount equal to  of your plate)   Oatmeal, high-fiber/lower-sugar cereals  Whole-grain breads, rice, pasta, barley, couscous   Sweet potatoes Sugary, low-fiber cereals  Large bagels, muffins, croissants, white bread  [place] fries, hash browns, fried rice   Meat and alternative (amount equal to  of your plate)   Lean meats, poultry, fish, eggs, low-fat cheese  Tofu, vegetable protein Legumes (e.g. lentils, chickpeas, beans) High-salt and/or high-fat meats (e.g. ribs, wings, sausages, wieners, processed lunch meats, imposter meats)   Vegetables (amount equal to  of your plate)   Salads ([place], garden, spinach), plain vegetables  Vegetables on sandwiches ect Salads with creamy, high-fat dressings and toppings like bacon bits,  croutons, cheese  Desserts Fresh fruit, frozen yogourt, skim milk latte Cakes, pies, pastries, ice cream, cheesecake

## 2022-10-15 NOTE — Progress Notes (Signed)
Patient ID: Ethan Silva                 DOB: [birth year]                      MRN: [id number]      HPI: Ethan Silva is a 57 y.o. male referred by Dr. Herbie Baltimore to HTN clinic. PMH is significant for CAD, Pre-DM, HTN, HLD.  Patient following pharmacist for HLD and HTN. [facility] Chlorthalidone 25 mg daily was added to his lisinopril and amlodipine. And in Aug pt saw Dr. Herbie Baltimore BP was elevated so amlodipine dose was increased from 5 mg daily to 10 mg daily. Patient saw Ilda Basset D again in Sept 2024 spironolactone 12.5 mg was added to his other 1st line antihypertensives agents ( ACEi, DHP-CCB, Thiazide) Patient presented today with interpreter. Brought in home BP log, morning BP ~125-85 and evening ~135/89. Patient reports he suffers from back pain and after work in the evening he is usually tired, in pain that is why his BP elevates in the evening. From last 5 days it is ~145-150/89-90 range. He has been going to back doctor to get his back aligned and also going to therapy, combination of both helping with his back and leg pain. He takes his medications and tolerates them well. He has been eating a lot lately, want to loose some weight. Discussed healthy diet and chair exercise in details. Patient is in agreement to start doing some chair exercise and continue walking dog daily ( will try to walk fast pace)     Current HTN meds: Lisinopril 40 mg daily, amlodipine 10 mg daily, chlorthalidone 25 mg daily, spironolactone 12.5 mg daily  Previously tried: none  BP goal: <130/80   Diet: low salt diet Eat out twice week for lunch   Exercise: due to back pain unable to do exercise but go to PT for back pain twice week    Home BP readings: morning BP ~125-85 and evening 135/89.   Wt Readings from Last 3 Encounters:  10/15/22 167 lb 12.8 oz (76.1 kg)  07/17/22 173 lb (78.5 kg)  05/14/22 166 lb 6.4 oz (75.5 kg)   BP Readings from Last 3 Encounters:   10/15/22 112/73  09/10/22 139/86  08/10/22 121/82   Pulse Readings from Last 3 Encounters:  10/15/22 65  09/10/22 (!) 58  08/10/22 63    Renal function: CrCl cannot be calculated (Patient's most recent lab result is older than the maximum 21 days allowed.).  Past Medical History:  Diagnosis Date   Aortic stenosis, mild 07/2012   Very mild aortic stenosis noted on echo-mean gradient 11 mmHg.   Bile duct leak    CAD in native artery 07/14/2020   Small branch of OM 3 with 80% ostial stenosis.  Too small for PCI.  Otherwise questionable 30% LM and 30% PDA.   Chronic kidney disease 2017   left kidney stones   Headache(784.0)    Hx: of when BP is elevated   Hyperlipidemia    Hypertension    Non-STEMI (non-ST elevated myocardial infarction) (HCC) 07/13/2020   Admitted with hypertension and chest pain.  Cardiac cath showed small branch of an OM3 with 85% ostial stenosis not amenable to PCI.  No other significant disease noted.  Normal EF on echo.  Medical management.   Numbness and tingling in hands    Hx: of   Prediabetes     Current Outpatient  Medications on File Prior to Visit  Medication Sig Dispense Refill   acetaminophen (TYLENOL) 325 MG tablet Take 325 mg by mouth every 6 (six) hours as needed for moderate pain.     amLODipine (NORVASC) 10 MG tablet Take 1 tablet (10 mg total) by mouth daily. 90 tablet 3   aspirin 81 MG EC tablet Take 1 tablet (81 mg total) by mouth daily. Swallow whole. 90 tablet 3   chlorthalidone (HYGROTON) 25 MG tablet Take 1 tablet (25 mg total) by mouth daily. 90 tablet 3   gabapentin (NEURONTIN) 300 MG capsule Take 1 capsule (300 mg total) by mouth 3 (three) times daily. 90 capsule 0   lisinopril (ZESTRIL) 40 MG tablet Take 1 tablet (40 mg total) by mouth daily. 90 tablet 3   nitroGLYCERIN (NITROSTAT) 0.4 MG SL tablet Place 1 tablet (0.4 mg total) under the tongue every 5 (five) minutes x 3 doses as needed for chest pain. 25 tablet 6   pantoprazole  (PROTONIX) 40 MG tablet Take 1 tablet by mouth once daily 90 tablet 3   polyethylene glycol powder (GLYCOLAX/MIRALAX) 17 GM/SCOOP powder Take 17 g by mouth 2 (two) times daily as needed. 3350 g 1   rosuvastatin (CRESTOR) 40 MG tablet Take 1 tablet (40 mg total) by mouth daily. 90 tablet 3   spironolactone (ALDACTONE) 25 MG tablet Take 0.5 tablets (12.5 mg total) by mouth daily. 45 tablet 3   No current facility-administered medications on file prior to visit.    No Known Allergies  Blood pressure 112/73, pulse 65, height 5\' 3"  (1.6 m), weight 167 lb 12.8 oz (76.1 kg), SpO2 98%.   Assessment/Plan:  1. Hypertension -  Essential hypertension Assessment: BP is uncontrolled in office BP 112/73 mmHg; heart rate 65 goal (<130/80). Tolerates current medications well without any side effects Home BP morning BP ~125-85 and evening 135/89. Patient reports he suffers from back pain and after work in the evening he is usually tired and in pain that is why his BP elevates in the evening. From last 5 days it is ~145-150/89-90 range due to pain  Takes Chlorthalidone at night - leading to nocturia  Denies SOB, palpitation, chest pain, headaches,or swelling Reiterated the importance of regular exercise and low salt diet   Plan:  Start taking chlorthalidone in the morning and switch amlodipine 10 mg at night  Continue taking all other medications (lisinopril 40 mg QAM and spironolactone 12.5 mg QAM)  Start doing regular exercise and focus on healthy high fiber, low fat, low carb, low sodium diet  Patient to keep record of BP readings with heart rate and report to [place] at the next visit Patient to follow up with PharmD in 8 weeks  Labs ordered today: BMP today    Thank you  Carmela Hurt, Pharm.D [facility] A Division of [facility] [address] [place], [place] [address]  Phone: [phone]; Fax: [phone]

## 2022-10-17 LAB — BASIC METABOLIC PANEL
BUN/Creatinine Ratio: 22 — ABNORMAL HIGH (ref 9–20)
BUN: 23 mg/dL (ref 6–24)
CO2: 28 mmol/L (ref 20–29)
Calcium: 9.7 mg/dL (ref 8.7–10.2)
Chloride: 101 mmol/L (ref 96–106)
Creatinine, Ser: 1.03 mg/dL (ref 0.76–1.27)
Glucose: 108 mg/dL — ABNORMAL HIGH (ref 70–99)
Potassium: 4.2 mmol/L (ref 3.5–5.2)
Sodium: 142 mmol/L (ref 134–144)
eGFR: 84 mL/min/{1.73_m2} (ref >59)

## 2022-10-24 ENCOUNTER — Other Ambulatory Visit: Payer: Self-pay

## 2022-11-02 DIAGNOSIS — Z419 Encounter for procedure for purposes other than remedying health state, unspecified: Secondary | ICD-10-CM | POA: Diagnosis not present

## 2022-11-13 ENCOUNTER — Ambulatory Visit: Payer: 59 | Admitting: Emergency Medicine

## 2022-11-13 ENCOUNTER — Ambulatory Visit (INDEPENDENT_AMBULATORY_CARE_PROVIDER_SITE_OTHER): Payer: 59

## 2022-11-13 ENCOUNTER — Encounter: Payer: Self-pay | Admitting: Emergency Medicine

## 2022-11-13 VITALS — BP 114/76 | HR 70 | Temp 98.2°F | Ht 63.0 in | Wt 166.4 lb

## 2022-11-13 DIAGNOSIS — I10 Essential (primary) hypertension: Secondary | ICD-10-CM

## 2022-11-13 DIAGNOSIS — R7303 Prediabetes: Secondary | ICD-10-CM | POA: Diagnosis not present

## 2022-11-13 DIAGNOSIS — I25119 Atherosclerotic heart disease of native coronary artery with unspecified angina pectoris: Secondary | ICD-10-CM | POA: Diagnosis not present

## 2022-11-13 DIAGNOSIS — G8929 Other chronic pain: Secondary | ICD-10-CM

## 2022-11-13 DIAGNOSIS — M25562 Pain in left knee: Secondary | ICD-10-CM

## 2022-11-13 DIAGNOSIS — Z23 Encounter for immunization: Secondary | ICD-10-CM | POA: Diagnosis not present

## 2022-11-13 DIAGNOSIS — I700 Atherosclerosis of aorta: Secondary | ICD-10-CM

## 2022-11-13 DIAGNOSIS — Z1211 Encounter for screening for malignant neoplasm of colon: Secondary | ICD-10-CM

## 2022-11-13 LAB — COMPREHENSIVE METABOLIC PANEL
ALT: 27 U/L (ref 0–53)
AST: 19 U/L (ref 0–37)
Albumin: 4.6 g/dL (ref 3.5–5.2)
Alkaline Phosphatase: 39 U/L (ref 39–117)
BUN: 17 mg/dL (ref 6–23)
CO2: 26 meq/L (ref 19–32)
Calcium: 9.3 mg/dL (ref 8.4–10.5)
Chloride: 104 meq/L (ref 96–112)
Creatinine, Ser: 0.92 mg/dL (ref 0.40–1.50)
GFR: 90.96 mL/min (ref >60.00)
Glucose, Bld: 105 mg/dL — ABNORMAL HIGH (ref 70–99)
Potassium: 3.7 meq/L (ref 3.5–5.1)
Sodium: 141 meq/L (ref 135–145)
Total Bilirubin: 0.5 mg/dL (ref 0.2–1.2)
Total Protein: 7.6 g/dL (ref 6.0–8.3)

## 2022-11-13 LAB — CBC WITH DIFFERENTIAL/PLATELET
Basophils Absolute: 0 10*3/uL (ref 0.0–0.1)
Basophils Relative: 0.6 % (ref 0.0–3.0)
Eosinophils Absolute: 0.2 10*3/uL (ref 0.0–0.7)
Eosinophils Relative: 2.3 % (ref 0.0–5.0)
HCT: 50.7 % (ref 39.0–52.0)
Hemoglobin: 17.5 g/dL — ABNORMAL HIGH (ref 13.0–17.0)
Lymphocytes Relative: 28.8 % (ref 12.0–46.0)
Lymphs Abs: 1.9 10*3/uL (ref 0.7–4.0)
MCHC: 34.6 g/dL (ref 30.0–36.0)
MCV: 88.2 fL (ref 78.0–100.0)
Monocytes Absolute: 0.4 10*3/uL (ref 0.1–1.0)
Monocytes Relative: 6.4 % (ref 3.0–12.0)
Neutro Abs: 4.2 10*3/uL (ref 1.4–7.7)
Neutrophils Relative %: 61.9 % (ref 43.0–77.0)
Platelets: 214 10*3/uL (ref 150.0–400.0)
RBC: 5.74 Mil/uL (ref 4.22–5.81)
RDW: 13.6 % (ref 11.5–15.5)
WBC: 6.8 10*3/uL (ref 4.0–10.5)

## 2022-11-13 LAB — LIPID PANEL
Cholesterol: 222 mg/dL — ABNORMAL HIGH (ref 0–200)
HDL: 43.1 mg/dL (ref >39.00)
LDL Cholesterol: 133 mg/dL — ABNORMAL HIGH (ref 0–99)
NonHDL: 178.5
Total CHOL/HDL Ratio: 5
Triglycerides: 230 mg/dL — ABNORMAL HIGH (ref 0.0–149.0)
VLDL: 46 mg/dL — ABNORMAL HIGH (ref 0.0–40.0)

## 2022-11-13 LAB — HEMOGLOBIN A1C: Hgb A1c MFr Bld: 6.2 % (ref 4.6–6.5)

## 2022-11-13 MED ORDER — ROSUVASTATIN CALCIUM 40 MG PO TABS
40.0000 mg | ORAL_TABLET | Freq: Every day | ORAL | 3 refills | Status: DC
Start: 2022-11-13 — End: 2022-11-26

## 2022-11-13 NOTE — Assessment & Plan Note (Signed)
History of osteoarthritis X-ray done today and report reviewed Pain management discussed Recommend limited duties at work Work note provided.

## 2022-11-13 NOTE — Assessment & Plan Note (Signed)
Well-controlled hypertension Continue amlodipine 10 mg and chlorthalidone 25 mg and lisinopril 40 mg daily Cardiovascular risks associated with hypertension discussed Diet and nutrition discussed

## 2022-11-13 NOTE — Assessment & Plan Note (Signed)
Stable.  Blood work done today Diet and nutrition discussed

## 2022-11-13 NOTE — Assessment & Plan Note (Signed)
Stable.  No recent anginal symptoms Continues daily baby aspirin No recent use of sublingual nitroglycerin

## 2022-11-13 NOTE — Assessment & Plan Note (Signed)
Diet and nutrition discussed. Continue rosuvastatin 40 mg daily. Continue daily baby aspirin

## 2022-11-13 NOTE — Patient Instructions (Signed)
Dolor agudo de [employer] adultos Acute Knee Pain, Adult El dolor de rodilla puede tener muchas causas. A veces, el dolor de rodilla es repentino (agudo) y puede deberse a dao, hinchazn o irritacin de los msculos y tejidos que sostienen la rodilla. A menudo, el dolor desaparece solo con el tiempo y con reposo. Si el dolor no desaparece, pueden hacerse pruebas para hallar su causa. Siga estas instrucciones en su casa: Si tiene una rodillera o un dispositivo ortopdico:  Use la rodillera o el dispositivo ortopdico como se lo haya indicado el mdico. Quteselos solamente como se lo haya indicado el mdico. Afljeselos si los dedos del pie: [place]. Se adormecen. Se tornan fros y de [employer]. Mantngalos limpios. Si la rodillera o el dispositivo ortopdico no son impermeables: No deje que se mojen. Cbralos con un envoltorio hermtico cuando tome un bao de inmersin o una ducha. Actividad Descanse la rodilla. No haga cosas que le causen dolor o que lo intensifiquen. Evite las actividades en las que ambos pies no estn en contacto con el piso al mismo tiempo (actividades de alto impacto). Algunos ejemplos son correr, Public relations account executive soga y hacer saltos de tijera. Trabaje con un fisioterapeuta para crear un programa de ejercicios seguros, como le haya indicado el mdico. Control del dolor, la rigidez y la hinchazn  Si se lo indican, aplique hielo sobre la rodilla. Para hacer esto: Si tiene una rodillera o un dispositivo ortopdico que se puede quitar, proceda como se lo haya indicado el mdico. Ponga el hielo en una bolsa plstica. Coloque una toalla entre la piel y Copy. Aplique el hielo durante 20 minutos, 2 o 3 veces por da. Retire el hielo si la piel se le pone de color rojo brillante. Esto es [employer]. Si no puede sentir dolor, calor o fro, tiene un mayor riesgo de que se dae la zona. Si se lo indican, use una venda elstica para ejercer presin (compresin) en la  rodilla lesionada. Cuando est sentado o acostado, eleve la rodilla por encima del nivel del corazn. Duerma con una almohada debajo de la rodilla. Indicaciones generales Use los medicamentos de venta libre y los recetados solamente como se lo haya indicado el mdico. No fume ni consuma ningn producto que contenga nicotina o tabaco. Si necesita ayuda para dejar de consumir estos productos, consulte al mdico. Si tiene sobrepeso, trabaje con su mdico y un experto en alimentos (nutricionista) para establecer metas para bajar de peso. El sobrepeso puede aumentar el dolor de rodilla. Controle si hay algn cambio en sus sntomas. Cumpla con todas las visitas de seguimiento. Comunquese con un mdico si: El dolor de rodilla no desaparece. El dolor de rodilla cambia o [place]. Tiene fiebre junto con dolor de rodilla. La rodilla est enrojecida o se siente caliente al tacto. La rodilla le falla o se le queda trabada. Solicite ayuda de inmediato si: La rodilla se le hincha, y la Barrister's clerk. No puede mover la rodilla. Tiene un dolor muy intenso en la rodilla que no se alivia con analgsicos. Resumen El dolor de rodilla puede tener muchas causas. A menudo, el dolor desaparece solo con el tiempo y con reposo. El mdico puede hacerle estudios para Financial risk analyst la causa del dolor. Controle si hay algn cambio en sus sntomas. [employer] dolor con descanso, medicamentos, actividad de poca intensidad y [place] de hielo. Solicite ayuda de inmediato si no puede mover la rodilla o si el dolor de rodilla es muy intenso. Esta  informacin no tiene Theme park manager el consejo del mdico. Asegrese de hacerle al mdico cualquier pregunta que tenga. Document Revised: 07/21/2019 Document Reviewed: 07/21/2019 Elsevier Patient Education  2024 [employer].

## 2022-11-13 NOTE — Progress Notes (Signed)
Ethan Silva 57 y.o.   Chief Complaint  Patient presents with   Knee Pain    Patient is having pain in his left knee x 2 weeks.     HISTORY OF PRESENT ILLNESS: This is a 57 y.o. male complaining of left knee pain for 2 weeks limiting his ability to work. Also here for follow-up of chronic medical conditions  Knee Pain      Prior to Admission medications   Medication Sig Start Date End Date Taking? Authorizing Provider  acetaminophen (TYLENOL) 325 MG tablet Take 325 mg by mouth every 6 (six) hours as needed for moderate pain.   Yes [provider]  aspirin 81 MG EC tablet Take 1 tablet (81 mg total) by mouth daily. Swallow whole. 07/15/20  Yes O'Neal, Ronnald Ramp, MD  nitroGLYCERIN (NITROSTAT) 0.4 MG SL tablet Place 1 tablet (0.4 mg total) under the tongue every 5 (five) minutes x 3 doses as needed for chest pain. 09/10/22  Yes Marykay Lex, MD  pantoprazole (PROTONIX) 40 MG tablet Take 1 tablet by mouth once daily 05/14/22  Yes Marykay Lex, MD  polyethylene glycol powder (GLYCOLAX/MIRALAX) 17 GM/SCOOP powder Take 17 g by mouth 2 (two) times daily as needed. 10/11/20  Yes Corwin Levins, MD  spironolactone (ALDACTONE) 25 MG tablet Take 0.5 tablets (12.5 mg total) by mouth daily. 09/10/22 12/09/22 Yes Marykay Lex, MD  amLODipine (NORVASC) 10 MG tablet Take 1 tablet (10 mg total) by mouth daily. 05/14/22 08/12/22  Marykay Lex, MD  chlorthalidone (HYGROTON) 25 MG tablet Take 1 tablet (25 mg total) by mouth daily. 07/31/22 10/29/22  Marykay Lex, MD  gabapentin (NEURONTIN) 300 MG capsule Take 1 capsule (300 mg total) by mouth 3 (three) times daily. 04/25/22 05/25/22  London Sheer, MD  lisinopril (ZESTRIL) 40 MG tablet Take 1 tablet (40 mg total) by mouth daily. 06/18/22 09/16/22  Marykay Lex, MD  rosuvastatin (CRESTOR) 40 MG tablet Take 1 tablet (40 mg total) by mouth daily. 10/30/21 10/25/22  Georgina Quint, MD    No Known  Allergies  Patient Active Problem List   Diagnosis Date Noted   Mild aortic stenosis by prior echocardiogram 05/14/2022   Back pain of lumbar region with sciatica 04/16/2022   Degenerative disc disease, lumbar 04/16/2022   Lumbosacral pain 03/27/2022   Lumbosacral strain 03/21/2022   Musculoskeletal pain 03/21/2022   Chronic foot pain, right 10/30/2021   Calcaneal spur of right foot 10/30/2021   Aortic atherosclerosis (HCC) 04/25/2021   Coronary artery disease involving native coronary artery of native heart with angina pectoris (HCC) 07/15/2020   Prediabetes 07/15/2020   BMI 30.0-30.9,adult 08/30/2015   Hyperlipidemia with target LDL less than 70 08/30/2015   Essential hypertension 10/11/2012    Past Medical History:  Diagnosis Date   Aortic stenosis, mild 07/2012   Very mild aortic stenosis noted on echo-mean gradient 11 mmHg.   Bile duct leak    CAD in native artery 07/14/2020   Small branch of OM 3 with 80% ostial stenosis.  Too small for PCI.  Otherwise questionable 30% LM and 30% PDA.   Chronic kidney disease 2017   left kidney stones   Headache(784.0)    Hx: of when BP is elevated   Hyperlipidemia    Hypertension    Non-STEMI (non-ST elevated myocardial infarction) (HCC) 07/13/2020   Admitted with hypertension and chest pain.  Cardiac cath showed small branch of an OM3 with 85% ostial stenosis  not amenable to PCI.  No other significant disease noted.  Normal EF on echo.  Medical management.   Numbness and tingling in hands    Hx: of   Prediabetes     Past Surgical History:  Procedure Laterality Date   BILIARY DILATION  03/16/2021   Procedure: BILIARY DILATION;  Surgeon: Rachael Fee, MD;  Location: [facility];  Service: Endoscopy;;   CHOLECYSTECTOMY N/A 07/11/2012   Procedure: LAPAROSCOPIC CHOLECYSTECTOMY WITH INTRAOPERATIVE CHOLANGIOGRAM;  Surgeon: Axel Filler, MD;  Location: [facility] OR;  Service: General;  Laterality: N/A;   COLONOSCOPY     ENDOSCOPIC  RETROGRADE CHOLANGIOPANCREATOGRAPHY (ERCP) WITH PROPOFOL N/A 03/16/2021   Procedure: ENDOSCOPIC RETROGRADE CHOLANGIOPANCREATOGRAPHY (ERCP) WITH PROPOFOL;  Surgeon: Rachael Fee, MD;  Location: [facility];  Service: Endoscopy;  Laterality: N/A;   ERCP  07/16/2012   ERCP N/A 07/16/2012   Procedure: ENDOSCOPIC RETROGRADE CHOLANGIOPANCREATOGRAPHY (ERCP);  Surgeon: Louis Meckel, MD;  Location: [facility] OR;  Service: Gastroenterology;  Laterality: N/A;   ERCP N/A 10/06/2012   Procedure: ENDOSCOPIC RETROGRADE CHOLANGIOPANCREATOGRAPHY (ERCP);  Surgeon: Louis Meckel, MD;  Location: [facility];  Service: Endoscopy;  Laterality: N/A;   ESOPHAGOGASTRODUODENOSCOPY N/A 03/16/2021   Procedure: ESOPHAGOGASTRODUODENOSCOPY (EGD);  Surgeon: Rachael Fee, MD;  Location: [facility];  Service: Endoscopy;  Laterality: N/A;   EUS N/A 03/16/2021   Procedure: UPPER ENDOSCOPIC ULTRASOUND (EUS) RADIAL;  Surgeon: Rachael Fee, MD;  Location: [facility];  Service: Endoscopy;  Laterality: N/A;   HAND SURGERY     LEFT HEART CATH AND CORONARY ANGIOGRAPHY N/A 07/14/2020   Procedure: LEFT HEART CATH AND CORONARY ANGIOGRAPHY;  Surgeon: Lyn Records, MD;  Location: [facility];  Service: Cardiovascular; ? Culprit Lesion ~ 80% ostial small OM3 (too small & ostial for PCI). ~30% mid LM (at a bend).  LAD with D1 & D2 - normal, RCA minimal luminal irregularities ~ 30%. PDA   SPHINCTEROTOMY  03/16/2021   Procedure: SPHINCTEROTOMY;  Surgeon: Rachael Fee, MD;  Location: [facility];  Service: Endoscopy;;   TRANSTHORACIC ECHOCARDIOGRAM  07/14/2020   (NSTEMI/Accelerated Hypertension): EF 55 to 60%.  No RWM A.  Very mild Aortic Stenosis-mean gradient 11 to mmHg.    Social History   Socioeconomic History   Marital status: Married    Spouse name: Davonna Belling   Number of children: 3   Years of education: 6th grade   Highest education level: Not on file  Occupational History   Occupation: Painting     Employer: [employer], [place], [place]    Comment: Mostly indoor painting  Tobacco Use   Smoking status: Never    Passive exposure: Never   Smokeless tobacco: Never  Vaping Use   Vaping status: Never Used  Substance and Sexual Activity   Alcohol use: No   Drug use: No   Sexual activity: Yes    Partners: Female  Other Topics Concern   Not on file  Social History Narrative   Originally from [place], [place]. Came to the [place] in 1991.   Lives with his wife and their youngest son   His daughter lives in [place], [place] with her husband.  Second son is now moved out of the house as well.   He gets a decent amount of exercise walking around at work, but does not do routine exercise.  Does not drink or smoke   Social Determinants of Health   Financial Resource Strain: Not on file  Food Insecurity: Not on file  Transportation  Needs: Not on file  Physical Activity: Not on file  Stress: Not on file  Social Connections: Not on file  Intimate Partner Violence: Not on file    Family History  Problem Relation Age of Onset   Gallstones Mother    Hypertension Mother    Gallstones Brother    Diabetes Paternal Uncle      Review of Systems  Constitutional: Negative.  Negative for chills and fever.  HENT: Negative.  Negative for congestion and sore throat.   Respiratory: Negative.  Negative for cough and shortness of breath.   Cardiovascular: Negative.  Negative for chest pain and palpitations.  Gastrointestinal:  Negative for abdominal pain, diarrhea, nausea and vomiting.  Genitourinary: Negative.  Negative for dysuria and hematuria.  Musculoskeletal:  Positive for joint pain.  Skin: Negative.  Negative for rash.  Neurological: Negative.  Negative for dizziness and headaches.  All other systems reviewed and are negative.   Vitals:   11/13/22 1317  BP: 114/76  Pulse: 70  Temp: 98.2 F (36.8 C)  SpO2: 96%    Physical Exam Vitals reviewed.  Constitutional:       Appearance: Normal appearance.  HENT:     Head: Normocephalic.  Eyes:     Extraocular Movements: Extraocular movements intact.     Pupils: Pupils are equal, round, and reactive to light.  Cardiovascular:     Rate and Rhythm: Normal rate and regular rhythm.     Pulses: Normal pulses.     Heart sounds: Normal heart sounds.  Pulmonary:     Effort: Pulmonary effort is normal.     Breath sounds: Normal breath sounds.  Abdominal:     Palpations: Abdomen is soft.     Tenderness: There is no abdominal tenderness.  Musculoskeletal:     Cervical back: No tenderness.     Comments: Left knee: No erythema or swelling.  No tenderness.  Full range of motion.  Lymphadenopathy:     Cervical: No cervical adenopathy.  Skin:    General: Skin is warm and dry.     Capillary Refill: Capillary refill takes less than 2 seconds.  Neurological:     General: No focal deficit present.     Mental Status: He is alert and oriented to person, place, and time.  Psychiatric:        Mood and Affect: Mood normal.        Behavior: Behavior normal.      ASSESSMENT & PLAN: A total of 44 minutes was spent with the patient and counseling/coordination of care regarding preparing for this visit, review of most recent office visit notes, review of multiple chronic medical conditions under management, review of all medications, review of health maintenance items, cardiovascular risks associated with hypertension, education on nutrition, differential diagnosis of chronic knee pain, pain management, prognosis, documentation, and need for follow-up.  Problem List Items Addressed This Visit       Cardiovascular and Mediastinum   Essential hypertension - Primary (Chronic)    Well-controlled hypertension Continue amlodipine 10 mg and chlorthalidone 25 mg and lisinopril 40 mg daily Cardiovascular risks associated with hypertension discussed Diet and nutrition discussed      Relevant Medications   rosuvastatin  (CRESTOR) 40 MG tablet   Other Relevant Orders   CBC with Differential/Platelet   Comprehensive metabolic panel   Lipid panel   Hemoglobin A1c   Coronary artery disease involving native coronary artery of native heart with angina pectoris (HCC) (Chronic)    Stable.  No recent anginal symptoms Continues daily baby aspirin No recent use of sublingual nitroglycerin      Relevant Medications   rosuvastatin (CRESTOR) 40 MG tablet   Aortic atherosclerosis (HCC) (Chronic)    Diet and nutrition discussed. Continue rosuvastatin 40 mg daily. Continue daily baby aspirin      Relevant Medications   rosuvastatin (CRESTOR) 40 MG tablet   Other Relevant Orders   Lipid panel     Other   Prediabetes (Chronic)    Stable.  Blood work done today Diet and nutrition discussed      Relevant Orders   Comprehensive metabolic panel   Hemoglobin A1c   Chronic pain of left knee    History of osteoarthritis X-ray done today and report reviewed Pain management discussed Recommend limited duties at work Work note provided.      Relevant Orders   DG Knee 1-2 Views Left   Other Visit Diagnoses     Need for vaccination       Relevant Orders   Flu vaccine trivalent PF, 6mos and older(Flulaval,Afluria,Fluarix,Fluzone)   Screening for colon cancer       Relevant Orders   Cologuard        Patient Instructions  Dolor agudo de rodilla en los adultos Acute Knee Pain, Adult El dolor de rodilla puede tener muchas causas. A veces, el dolor de rodilla es repentino (agudo) y puede deberse a dao, hinchazn o irritacin de los msculos y tejidos que sostienen la rodilla. A menudo, el dolor desaparece solo con el tiempo y con reposo. Si el dolor no desaparece, pueden hacerse pruebas para hallar su causa. Siga estas instrucciones en su casa: Si tiene una rodillera o un dispositivo ortopdico:  Use la rodillera o el dispositivo ortopdico como se lo haya indicado el mdico. Quteselos solamente como  se lo haya indicado el mdico. Afljeselos si los dedos del pie: Hormiguean. Se adormecen. Se tornan fros y de [employer]. Mantngalos limpios. Si la rodillera o el dispositivo ortopdico no son impermeables: No deje que se mojen. Cbralos con un envoltorio hermtico cuando tome un bao de inmersin o una ducha. Actividad Descanse la rodilla. No haga cosas que le causen dolor o que lo intensifiquen. Evite las actividades en las que ambos pies no estn en contacto con el piso al mismo tiempo (actividades de alto impacto). Algunos ejemplos son correr, Public relations account executive soga y hacer saltos de tijera. Trabaje con un fisioterapeuta para crear un programa de ejercicios seguros, como le haya indicado el mdico. Control del dolor, la rigidez y la hinchazn  Si se lo indican, aplique hielo sobre la rodilla. Para hacer esto: Si tiene una rodillera o un dispositivo ortopdico que se puede quitar, proceda como se lo haya indicado el mdico. Ponga el hielo en una bolsa plstica. Coloque una toalla entre la piel y Copy. Aplique el hielo durante 20 minutos, 2 o 3 veces por da. Retire el hielo si la piel se le pone de color rojo brillante. Esto es [employer]. Si no puede sentir dolor, calor o fro, tiene un mayor riesgo de que se dae la zona. Si se lo indican, use una venda elstica para ejercer presin (compresin) en la rodilla lesionada. Cuando est sentado o acostado, eleve la rodilla por encima del nivel del corazn. Duerma con una almohada debajo de la rodilla. Indicaciones generales Use los medicamentos de venta libre y los recetados solamente como se lo haya indicado el mdico. No fume ni consuma ningn producto que  contenga nicotina o tabaco. Si necesita ayuda para dejar de consumir estos productos, consulte al mdico. Si tiene sobrepeso, trabaje con su mdico y un experto en alimentos (nutricionista) para establecer metas para bajar de peso. El sobrepeso puede aumentar el dolor de  rodilla. Controle si hay algn cambio en sus sntomas. Cumpla con todas las visitas de seguimiento. Comunquese con un mdico si: El dolor de rodilla no desaparece. El dolor de rodilla cambia o [place]. Tiene fiebre junto con dolor de rodilla. La rodilla est enrojecida o se siente caliente al tacto. La rodilla le falla o se le queda trabada. Solicite ayuda de inmediato si: La rodilla se le hincha, y la Barrister's clerk. No puede mover la rodilla. Tiene un dolor muy intenso en la rodilla que no se alivia con analgsicos. Resumen El dolor de rodilla puede tener muchas causas. A menudo, el dolor desaparece solo con el tiempo y con reposo. El mdico puede hacerle estudios para Financial risk analyst la causa del dolor. Controle si hay algn cambio en sus sntomas. [employer] dolor con descanso, medicamentos, actividad de poca intensidad y [place] de hielo. Solicite ayuda de inmediato si no puede mover la rodilla o si el dolor de rodilla es muy intenso. Esta informacin no tiene Theme park manager el consejo del mdico. Asegrese de hacerle al mdico cualquier pregunta que tenga. Document Revised: 07/21/2019 Document Reviewed: 07/21/2019 Elsevier Patient Education  2024 Elsevier Inc.     Edwina Barth, MD [facility]

## 2022-11-14 ENCOUNTER — Other Ambulatory Visit: Payer: Self-pay | Admitting: Emergency Medicine

## 2022-11-14 DIAGNOSIS — G8929 Other chronic pain: Secondary | ICD-10-CM

## 2022-11-15 ENCOUNTER — Ambulatory Visit: Payer: 59 | Admitting: Physician Assistant

## 2022-11-15 ENCOUNTER — Encounter: Payer: Self-pay | Admitting: Physician Assistant

## 2022-11-15 DIAGNOSIS — M1712 Unilateral primary osteoarthritis, left knee: Secondary | ICD-10-CM | POA: Insufficient documentation

## 2022-11-15 MED ORDER — LIDOCAINE HCL 1 % IJ SOLN
3.0000 mL | INTRAMUSCULAR | Status: AC | PRN
Start: 2022-11-15 — End: 2022-11-15
  Administered 2022-11-15: 3 mL

## 2022-11-15 MED ORDER — METHYLPREDNISOLONE ACETATE 40 MG/ML IJ SUSP
40.0000 mg | INTRAMUSCULAR | Status: AC | PRN
Start: 2022-11-15 — End: 2022-11-15
  Administered 2022-11-15: 40 mg via INTRA_ARTICULAR

## 2022-11-15 NOTE — Progress Notes (Signed)
Office Visit Note   Patient: Ethan Silva           Date of Birth: 15-Apr-1965           MRN: [id number] Visit Date: 11/15/2022              Requested by: Ethan Quint, MD [address] [place],  [place] [address] PCP: Ethan Quint, MD   Assessment & Plan: Visit Diagnoses:  1. Unilateral primary osteoarthritis, left knee     Plan: Patient is seen with the help of an interpreter today.  He is a pleasant 57 year old gentleman with a history of left knee pain.  This has been chronic.  He has had injections in the past and they seem to help.  He has had no new injury.  X-rays consistent with osteoarthritis of the left knee.  Will go forward with an injection may follow-up as needed  Follow-Up Instructions: As needed  Orders:  No orders of the defined types were placed in this encounter.  No orders of the defined types were placed in this encounter.     Procedures: Large Joint Inj: L knee on 11/15/2022 10:58 AM Indications: pain and diagnostic evaluation Details: 25 G 1.5 in needle, anteromedial approach  Arthrogram: No  Medications: 40 mg methylPREDNISolone acetate 40 MG/ML; 3 mL lidocaine 1 % Outcome: tolerated well, no immediate complications Procedure, treatment alternatives, risks and benefits explained, specific risks discussed. Consent was given by the patient.       Clinical Data: No additional findings.   Subjective: Chief Complaint  Patient presents with   Left Knee - Pain    HPI patient is a pleasant 57 year old gentleman with a moderate amount of pain secondary to to his left knee.  Denies injury has gotten injections in the past which seem to help.  No fever chills or other issues   Review of Systems  All other systems reviewed and are negative.    Objective: Vital Signs: There were no vitals taken for this visit.  Physical Exam Constitutional:      Appearance: Normal appearance.  Pulmonary:     Effort:  Pulmonary effort is normal.  Skin:    General: Skin is warm and dry.  Neurological:     General: No focal deficit present.     Mental Status: He is alert and oriented to person, place, and time.  Psychiatric:        Mood and Affect: Mood normal.        Behavior: Behavior normal.     Ortho Exam Left knee no effusion no erythema compartments are soft and compressible has good quadriceps and patellar strength.  Neurovascular intact Specialty Comments:  MRI LUMBAR SPINE WITHOUT CONTRAST   TECHNIQUE: Multiplanar, multisequence MR imaging of the lumbar spine was performed. No intravenous contrast was administered.   COMPARISON:  Radiographs April 25, 2022. CT chest, abdomen and April 23, 2021.   FINDINGS: Segmentation: Transitional anatomy. Utilizing prior CT as reference, there are 12 rib-bearing vertebrae followed by 1 transitional vertebra with rudimentary ribs and 5 lumbar type, non rib-bearing vertebrae. For the purpose of this study, the transitional thoracolumbar vertebra with rudimentary ribs is named L1 and the last well-formed disc space corresponds to S1-2.   Alignment:  Physiologic.   Vertebrae: No fracture, evidence of discitis, or bone lesion. Congenitally small spinal canal.   Conus medullaris and cauda equina: Conus extends to the L2 level. Conus and cauda equina appear normal.  Paraspinal and other soft tissues: Bosniak I/Bosniak II benign renal cyst measuring up to 3 cm. No follow-up imaging is recommended. [employer] 2021; [id number], Bosniak Classification of Cystic Renal Masses, Version 2019.   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   Accessory piriform muscle bilaterally covering sacral foramina (series 6 and 7, image 48).   Disc levels:   T12-L1: No spinal canal or neural foraminal stenosis.   L1-2: No spinal canal or neural foraminal stenosis.   L2-3: Disc bulge and mild facet degenerative changes resulting  in mild spinal canal stenosis and mild bilateral neural foraminal narrowing.   L3-4: Disc bulge and mild facet degenerative changes resulting in mild spinal canal stenosis with mild narrowing of the bilateral subarticular zone and mild to moderate bilateral neural foraminal narrowing.   L4-5: Disc bulge, mild-to-moderate facet degenerative changes resulting in mild spinal canal stenosis and moderate bilateral neural foraminal narrowing.   L5-S1: Disc bulge, advanced hypertrophic facet degenerative changes with trace bilateral joint effusion and ligamentum flavum redundancy resulting in severe spinal canal stenosis and severe bilateral neural foraminal narrowing.   S1-2: Prominent hypertrophic facet degenerative changes, right greater than left resulting in mild bilateral neural foraminal narrowing. No significant spinal canal stenosis.   IMPRESSION: 1. Transitional anatomy.  Please see detailed description above. 2. 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 3. Mild spinal canal stenosis at L2-3, L3-4 and L4-5. 4. Mild to moderate bilateral neural foraminal narrowing at L3-4. 5. Moderate bilateral neural foraminal narrowing at L4-5. 6. Accessory piriform muscle bilaterally.     Electronically Signed   By: Baldemar Lenis M.D.   On: 05/10/2022 16:09  Imaging: No results found.   PMFS History: Patient Active Problem List   Diagnosis Date Noted   Unilateral primary osteoarthritis, left knee 11/15/2022   Chronic pain of left knee 11/13/2022   Mild aortic stenosis by prior echocardiogram 05/14/2022   Back pain of lumbar region with sciatica 04/16/2022   Degenerative disc disease, lumbar 04/16/2022   Lumbosacral pain 03/27/2022   Lumbosacral strain 03/21/2022   Musculoskeletal pain 03/21/2022   Chronic foot  pain, right 10/30/2021   Calcaneal spur of right foot 10/30/2021   Aortic atherosclerosis (HCC) 04/25/2021   Coronary artery disease involving native coronary artery of native heart with angina pectoris (HCC) 07/15/2020   Prediabetes 07/15/2020   BMI 30.0-30.9,adult 08/30/2015   Hyperlipidemia with target LDL less than 70 08/30/2015   Essential hypertension 10/11/2012   Past Medical History:  Diagnosis Date   Aortic stenosis, mild 07/2012   Very mild aortic stenosis noted on echo-mean gradient 11 mmHg.   Bile duct leak    CAD in native artery 07/14/2020   Small branch of OM 3 with 80% ostial stenosis.  Too small for PCI.  Otherwise questionable 30% LM and 30% PDA.   Chronic kidney disease 2017   left kidney stones   Headache(784.0)    Hx: of when BP is elevated   Hyperlipidemia    Hypertension    Non-STEMI (non-ST elevated myocardial infarction) (HCC) 07/13/2020   Admitted with hypertension and chest pain.  Cardiac cath showed small branch of an OM3 with 85% ostial stenosis not amenable to PCI.  No other significant disease noted.  Normal EF on echo.  Medical management.   Numbness and tingling in hands    Hx: of  Prediabetes     Family History  Problem Relation Age of Onset   Gallstones Mother    Hypertension Mother    Gallstones Brother    Diabetes Paternal Uncle     Past Surgical History:  Procedure Laterality Date   BILIARY DILATION  03/16/2021   Procedure: BILIARY DILATION;  Surgeon: Rachael Fee, MD;  Location: [facility];  Service: Endoscopy;;   CHOLECYSTECTOMY N/A 07/11/2012   Procedure: LAPAROSCOPIC CHOLECYSTECTOMY WITH INTRAOPERATIVE CHOLANGIOGRAM;  Surgeon: Axel Filler, MD;  Location: [facility] OR;  Service: General;  Laterality: N/A;   COLONOSCOPY     ENDOSCOPIC RETROGRADE CHOLANGIOPANCREATOGRAPHY (ERCP) WITH PROPOFOL N/A 03/16/2021   Procedure: ENDOSCOPIC RETROGRADE CHOLANGIOPANCREATOGRAPHY (ERCP) WITH PROPOFOL;  Surgeon: Rachael Fee, MD;  Location: [facility];  Service: Endoscopy;  Laterality: N/A;   ERCP  07/16/2012   ERCP N/A 07/16/2012   Procedure: ENDOSCOPIC RETROGRADE CHOLANGIOPANCREATOGRAPHY (ERCP);  Surgeon: Louis Meckel, MD;  Location: [facility];  Service: Gastroenterology;  Laterality: N/A;   ERCP N/A 10/06/2012   Procedure: ENDOSCOPIC RETROGRADE CHOLANGIOPANCREATOGRAPHY (ERCP);  Surgeon: Louis Meckel, MD;  Location: [facility];  Service: Endoscopy;  Laterality: N/A;   ESOPHAGOGASTRODUODENOSCOPY N/A 03/16/2021   Procedure: ESOPHAGOGASTRODUODENOSCOPY (EGD);  Surgeon: Rachael Fee, MD;  Location: [facility];  Service: Endoscopy;  Laterality: N/A;   EUS N/A 03/16/2021   Procedure: UPPER ENDOSCOPIC ULTRASOUND (EUS) RADIAL;  Surgeon: Rachael Fee, MD;  Location: [facility];  Service: Endoscopy;  Laterality: N/A;   HAND SURGERY     LEFT HEART CATH AND CORONARY ANGIOGRAPHY N/A 07/14/2020   Procedure: LEFT HEART CATH AND CORONARY ANGIOGRAPHY;  Surgeon: Lyn Records, MD;  Location: [facility];  Service: Cardiovascular; ? Culprit Lesion ~ 80% ostial small OM3 (too small & ostial for PCI). ~30% mid LM (at a bend).  LAD with D1 & D2 - normal, RCA minimal luminal irregularities ~ 30%. PDA   SPHINCTEROTOMY  03/16/2021   Procedure: SPHINCTEROTOMY;  Surgeon: Rachael Fee, MD;  Location: [facility];  Service: Endoscopy;;   TRANSTHORACIC ECHOCARDIOGRAM  07/14/2020   (NSTEMI/Accelerated Hypertension): EF 55 to 60%.  No RWM A.  Very mild Aortic Stenosis-mean gradient 11 to mmHg.   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

## 2022-11-16 ENCOUNTER — Other Ambulatory Visit (HOSPITAL_COMMUNITY): Payer: Self-pay

## 2022-11-26 ENCOUNTER — Ambulatory Visit: Payer: 59 | Attending: Cardiology | Admitting: Cardiology

## 2022-11-26 ENCOUNTER — Encounter: Payer: Self-pay | Admitting: Cardiology

## 2022-11-26 VITALS — BP 144/84 | HR 69 | Ht 63.0 in | Wt 168.6 lb

## 2022-11-26 DIAGNOSIS — R7303 Prediabetes: Secondary | ICD-10-CM

## 2022-11-26 DIAGNOSIS — I2510 Atherosclerotic heart disease of native coronary artery without angina pectoris: Secondary | ICD-10-CM | POA: Diagnosis not present

## 2022-11-26 DIAGNOSIS — M545 Low back pain, unspecified: Secondary | ICD-10-CM

## 2022-11-26 DIAGNOSIS — I10 Essential (primary) hypertension: Secondary | ICD-10-CM | POA: Diagnosis not present

## 2022-11-26 DIAGNOSIS — Z6830 Body mass index (BMI) 30.0-30.9, adult: Secondary | ICD-10-CM

## 2022-11-26 DIAGNOSIS — I350 Nonrheumatic aortic (valve) stenosis: Secondary | ICD-10-CM | POA: Diagnosis not present

## 2022-11-26 DIAGNOSIS — I700 Atherosclerosis of aorta: Secondary | ICD-10-CM

## 2022-11-26 DIAGNOSIS — E785 Hyperlipidemia, unspecified: Secondary | ICD-10-CM

## 2022-11-26 MED ORDER — ROSUVASTATIN CALCIUM 40 MG PO TABS
40.0000 mg | ORAL_TABLET | Freq: Every day | ORAL | 3 refills | Status: DC
Start: 2022-11-26 — End: 2022-12-19

## 2022-11-26 MED ORDER — AMLODIPINE BESYLATE 10 MG PO TABS: 10.0000 mg | ORAL_TABLET | Freq: Every day | ORAL | 3 refills | Status: DC

## 2022-11-26 MED ORDER — CHLORTHALIDONE 25 MG PO TABS: 25.0000 mg | ORAL_TABLET | Freq: Every day | ORAL | 3 refills | Status: DC

## 2022-11-26 MED ORDER — LISINOPRIL 40 MG PO TABS: 40.0000 mg | ORAL_TABLET | Freq: Every day | ORAL | 3 refills | Status: DC

## 2022-11-26 NOTE — Assessment & Plan Note (Signed)
Quite low gradient echo.  Will be due for follow-up echocardiogram summer 2025

## 2022-11-26 NOTE — Assessment & Plan Note (Signed)
Monitored by PCP.  A1c stable at 6.2. Discussed dietary modification. Low threshold to consider initiation of therapy with potentially metformin plus or minus SGLT2 inhibitor

## 2022-11-26 NOTE — Assessment & Plan Note (Signed)
Small OM branch that was significantly stenosed was the likely cause for demand ischemia and elevation of troponins in response to hypertensive urgency.  Not PCI target. Thankfully, no further anginal symptoms.  Plan: Continue aggressive risk factor modification  Daily aspirin 81 mg On amlodipine, lisinopril, spironolactone and chlorthalidone.  Not on beta-blocker because of concerns for fatigue and bradycardia in the hospital. BP followed by [facility] Could consider carvedilol or nebivolol if additional blood pressure control is needed.   On rosuvastatin 40 mg daily but labs indicate the lipids are not adequately controlled.   Due to see [facility] lipid clinic.

## 2022-11-26 NOTE — Assessment & Plan Note (Signed)
CV risk factor modification-diet and nutrition  Is not adequately controlled with current dose of statin, we we will refill today but have referred to [facility] lipid clinic. BP also elevated today but has been stable on multiple medications.

## 2022-11-26 NOTE — Assessment & Plan Note (Signed)
Blood pressure elevated in clinic today, but patient reports home readings of 125-135 systolic. No changes in medication regimen.  He is in more notable back pain today and also somewhat anxious. The blood pressure is usually elevated in clinic.  Blood pressures monitored at home have been optimal. (BP range at home stable.)  -Continue current antihypertensive medications (amlodipine, chlorthalidone, lisinopril, spironolactone). -Monitor blood pressure at home and report if consistently above 135 systolic. -Due to be seen in [facility] Lipid Clinic on 12/18/

## 2022-11-26 NOTE — Assessment & Plan Note (Signed)
Discussed portance of dietary modification and trying to increase exercise level.  Right now limited by back and knee pain.

## 2022-11-26 NOTE — Patient Instructions (Addendum)
Medication Instructions:  No changes    Medication  was refilled   *If you need a refill on your cardiac medications before your next appointment, please call your pharmacy*   Lab Work: Not needed If you have labs (blood work) drawn today and your tests are completely normal, you will receive your results only by: MyChart Message (if you have MyChart) OR A paper copy in the mail If you have any lab test that is abnormal or we need to change your treatment, we will call you to review the results.   Testing/Procedures: Not needed   Follow-Up: At [facility], you and your health needs are our priority.  As part of our continuing mission to provide you with exceptional heart care, we have created designated Provider Care Teams.  These Care Teams include your primary Cardiologist (physician) and Advanced Practice Providers (APPs -  Physician Assistants and Nurse Practitioners) who all work together to provide you with the care you need, when you need it.     Your next appointment:   8 month(s) July 2025  The format for your next appointment:   In Person  Provider:   Bryan Lemma, MD    Other Instructions  Monitor blood pressure -  goal is to be less than 135-140 systolic   Keep appointment with pharmacist on Dec 18 , 2024

## 2022-11-26 NOTE — Progress Notes (Signed)
Cardiology Office Note:  .   Date:  11/26/2022  ID:  Ethan, Silva October 05, 1965, MRN [id number] PCP: Ethan Quint, MD   HeartCare Providers Cardiologist:  Ethan Lemma, MD     Chief Complaint  Patient presents with   Follow-up    Mostly concerned with back pain and radiculopathy   Coronary Artery Disease    Small vessel occlusive disease.  No angina; lipids poorly controlled   Hypertension    Blood pressure high today, but has been much more stable at home.    Patient Profile: .     Ethan Silva Ethan Silva is a 57 y.o. Hispanic/Spanish speaking male  with a PMH notable for CAD, HTN, HLD, mild AS and pre-DM who presents here for 48-month follow-up at the request of Ethan Silva, Ethan Silva, *.   CAD 7/22 cath - 80% 3rd OM, mid LAD 30-40%, prox RCA 30%  HTN Amlodipine 10 mg, lisinopril 40 mg, spironolactone 12.5 mg chlorthalidone 25 mg  HLD Poorly controlled; currently on Crestor 40 mg; 11/13/2022: TC 222, TG 230, HDL 43, LDL 133  preDM 5/24 A1c 6.3 -> 11/24 A1c 6.2  CLBP Lumbar back pain with radiculopathy down both legs      Ethan Silva was last seen on 05/14/2022 -> noted off-and-on chest pain lasting a minute or so.  That was deviated by burping.  Major issue was tingling in his legs and feet.  Still working, denying any significant symptoms.  Fatigue more easily because of back pain. => Increase amlodipine to 10 mg daily, lab work ordered and referred to [facility] BP clinic.  He is most recent seen by Ethan Silva, [facility] for BP follow-up.  His home BP readings were 125/85-135/89.  Recommendation was to start taking chlorthalidone in the morning and switch amlodipine to p.m.  Trying to work on regular exercise.  Healthy diet.  Subjective  Discussed the use of AI scribe software for clinical note transcription with the patient, who gave verbal consent to proceed.  History of Present Illness   The patient, with a history of  hypertension, hyperlipidemia, and back pain, presents for a follow-up visit. He has been managing his blood pressure with amlodipine, chlorthalidone, lisinopril, and spironolactone, and reports that his home readings are typically in the 125-135 range. However, he notes that his blood pressure was higher than usual during the current visit, which he attributes to stress.  The patient has been experiencing some shortness of breath during vigorous activities but denies any chest pain or discomfort. He also reports occasional episodes of blurry vision and watery eyes. He has been experiencing back pain, which has led to consideration of surgery, but he has decided to delay this for the time being. He has tried physical therapy but reports no significant improvement. The patient also reports pain in his left knee, which has been managed with injections.  The patient's cholesterol levels have been a concern, with a recent total cholesterol of 222 and LDL of 133. He has been taking Crestor for this, but reports having run out of the medication a few days prior to the visit. He denies any muscle aches or cramping while on this medication.  The patient also reports some issues with sleep, specifically difficulty returning to sleep if he wakes up during the night. However, he denies any issues with breathing or snoring during sleep. He has not noticed any swelling in his legs.   Video Interpreter: Ethan Silva# [id number]      Cardiovascular  ROS: no chest pain or dyspnea on exertion positive for - DOE if > moderate exertion - but only on occasion negative for - edema, irregular heartbeat, orthopnea, palpitations, paroxysmal nocturnal dyspnea, rapid heart rate, shortness of breath, or syncope/near syncope, TIA/amaurosis fugax, - not really walking enough for claudication - limited by Back & knee pain.  No melena, hematochezia, hematuria, epistaxis, or easy bruising/ bleeding.  ROS:  Review of Systems - Negative except  LBP - with radiculopathy -- delaying back surgery    Objective   Current HTN meds: Lisinopril 40 mg daily, amlodipine 10 mg daily, chlorthalidone 25 mg daily Ethan Silva 12.5 mg daily.  Diet-low-salt.  Maybe eats out twice a week for lunch.  Exercises limited by back pain.  Working on PT.  Studies Reviewed: Marland Kitchen        LABS Total Cholesterol: 222 mg/dL (78/29/5621) Triglycerides: 230 mg/dL (30/86/5784) HDL: 43 mg/dL (69/62/9528) LDL: 413 mg/dL (24/40/1027) O5D: 6.6% (11/13/2022) Hemoglobin: 17.5 g/dL (44/03/4740) Creatinine: 0.92 mg/dL (59/56/3875) Potassium: 3.7 mmol/L (11/13/2022)  RADIOLOGY Knee X-ray: Evidence of arthritis  TRANSTHORACIC ECHOCARDIOGRAM 07/14/2020: (NSTEMI/Accelerated Hypertension): EF 55 to 60%.  No RWM A.  Very mild Aortic Stenosis-mean gradient 11 to mmHg. Cardiac Cath 07/14/2020: Culprit Small OM1 85% (not PCI favorable)     Risk Assessment/Calculations:     HYPERTENSION CONTROL Vitals:   11/26/22 0901 11/26/22 0912  BP: (!) 160/88 (!) 144/84    The patient's blood pressure is elevated above target today.  In order to address the patient's elevated BP: Blood pressure will be monitored at home to determine if medication changes need to be made.; The blood pressure is usually elevated in clinic.  Blood pressures monitored at home have been optimal. (BP range at home stable.)         [name] perioperative risk of a major cardiac event is 0.9% according to the Revised Cardiac Risk Index (RCRI).  Therefore, he is at low risk for perioperative complications.   His functional capacity is good at 7.25 METs according to the Duke Activity Status Index (DASI).  Mostly limited by back pain Recommendations: According to ACC/AHA guidelines, no further cardiovascular testing needed.  The patient may proceed to surgery at acceptable risk.   Antiplatelet and/or Anticoagulation Recommendations: Aspirin can be held for 5-7 days prior to his  surgery.  Please resume Aspirin post operatively when it is felt to be safe from a bleeding standpoint.  N/A        Physical Exam:   VS:  BP (!) 144/84   Pulse 69   Ht 5\' 3"  (1.6 m)   Wt 168 lb 9.6 oz (76.5 kg)   SpO2 93%   BMI 29.87 kg/m    Wt Readings from Last 3 Encounters:  11/26/22 168 lb 9.6 oz (76.5 kg)  11/13/22 166 lb 6 oz (75.5 kg)  10/15/22 167 lb 12.8 oz (76.1 kg)    GEN: Well nourished, well developed in no acute distress; healthy appearing. Anxious. Well groomed NECK: No JVD; No carotid bruits CARDIAC: RRR,Normal S1, S2; harsh 1/6 SEM at RUSB-neck, otherwise no rubs, gallops RESPIRATORY:  Clear to auscultation without rales, wheezing or rhonchi ; nonlabored, good air movement. ABDOMEN: Soft, non-tender, non-distended EXTREMITIES:  No edema; No deformity ; abnormal gait    ASSESSMENT AND PLAN: .    Problem List Items Addressed This Visit       Cardiology Problems   Aortic atherosclerosis (HCC) (Chronic)    CV risk factor modification-diet and nutrition  Is not adequately controlled with current dose of statin, we we will refill today but have referred to [facility] lipid clinic. BP also elevated today but has been stable on multiple medications.      Relevant Medications   amLODipine (NORVASC) 10 MG tablet   chlorthalidone (HYGROTON) 25 MG tablet   lisinopril (ZESTRIL) 40 MG tablet   rosuvastatin (CRESTOR) 40 MG tablet   Coronary artery disease involving native coronary artery of native heart without angina pectoris - Primary (Chronic)    Small OM branch that was significantly stenosed was the likely cause for demand ischemia and elevation of troponins in response to hypertensive urgency.  Not PCI target. Thankfully, no further anginal symptoms.  Plan: Continue aggressive risk factor modification  Daily aspirin 81 mg On amlodipine, lisinopril, spironolactone and chlorthalidone.  Not on beta-blocker because of concerns for fatigue and bradycardia in the  hospital. BP followed by [facility] Could consider carvedilol or nebivolol if additional blood pressure control is needed.   On rosuvastatin 40 mg daily but labs indicate the lipids are not adequately controlled.   Due to see [facility] lipid clinic.       Relevant Medications   amLODipine (NORVASC) 10 MG tablet   chlorthalidone (HYGROTON) 25 MG tablet   lisinopril (ZESTRIL) 40 MG tablet   rosuvastatin (CRESTOR) 40 MG tablet   Essential hypertension (Chronic)    Blood pressure elevated in clinic today, but patient reports home readings of 125-135 systolic. No changes in medication regimen.  He is in more notable back pain today and also somewhat anxious. The blood pressure is usually elevated in clinic.  Blood pressures monitored at home have been optimal. (BP range at home stable.)  -Continue current antihypertensive medications (amlodipine, chlorthalidone, lisinopril, spironolactone). -Monitor blood pressure at home and report if consistently above 135 systolic. -Due to be seen in [facility] Lipid Clinic on 12/18/      Relevant Medications   amLODipine (NORVASC) 10 MG tablet   chlorthalidone (HYGROTON) 25 MG tablet   lisinopril (ZESTRIL) 40 MG tablet   rosuvastatin (CRESTOR) 40 MG tablet   Hyperlipidemia with target low density lipoprotein (LDL) cholesterol less than 55 mg/dL (Chronic)    Total cholesterol 222, LDL 133, despite Crestor therapy.  Patient ran out of Crestor 3 days ago which was after labs are drawn. -Refill Crestor prescription. -Follow-up with lipid clinic on December 19, 2022, to discuss lipid management.  - Anticipate PCSK9 Inhibitor or potentially Inclisarin (for ensured compliance)      Relevant Medications   amLODipine (NORVASC) 10 MG tablet   chlorthalidone (HYGROTON) 25 MG tablet   lisinopril (ZESTRIL) 40 MG tablet   rosuvastatin (CRESTOR) 40 MG tablet   Mild aortic stenosis by prior echocardiogram (Chronic)    Quite low gradient echo.  Will be due for follow-up  echocardiogram summer 2025      Relevant Medications   amLODipine (NORVASC) 10 MG tablet   chlorthalidone (HYGROTON) 25 MG tablet   lisinopril (ZESTRIL) 40 MG tablet   rosuvastatin (CRESTOR) 40 MG tablet     Other   BMI 30.0-30.9,adult (Chronic)    Discussed portance of dietary modification and trying to increase exercise level.  Right now limited by back and knee pain.      Lumbosacral pain    Limiting his activity. Patient reports back pain, has delayed surgery. No cardiac contraindications for surgery. -Continue current management for back pain. -Consider back surgery as discussed with surgeon. -> need extension to Out Of Work/.Light Work Restriction  Would be considered low risk patient for low risk surgery.      Prediabetes (Chronic)    Monitored by PCP.  A1c stable at 6.2. Discussed dietary modification. Low threshold to consider initiation of therapy with potentially metformin plus or minus SGLT2 inhibitor               Follow-Up: Return in about 8 months (around 07/26/2023) for 8 month follow-up with me. Preop evaluation noted above.  Total time spent: 26 min spent with patient + 25 min spent charting = 51 min     Signed, Marykay Lex, MD, MS Ethan Silva, M.D., M.S. Interventional Cardiologist  [facility]  Pager # [phone] Phone # [phone] [address] [place], [place] [address]

## 2022-11-26 NOTE — Assessment & Plan Note (Signed)
Limiting his activity. Patient reports back pain, has delayed surgery. No cardiac contraindications for surgery. -Continue current management for back pain. -Consider back surgery as discussed with surgeon. -> need extension to Out Of Work/.Light Work Restriction  Would be considered low risk patient for low risk surgery.

## 2022-11-26 NOTE — Assessment & Plan Note (Signed)
Total cholesterol 222, LDL 133, despite Crestor therapy.  Patient ran out of Crestor 3 days ago which was after labs are drawn. -Refill Crestor prescription. -Follow-up with lipid clinic on December 19, 2022, to discuss lipid management.  - Anticipate PCSK9 Inhibitor or potentially Inclisarin (for ensured compliance)

## 2022-12-02 DIAGNOSIS — Z419 Encounter for procedure for purposes other than remedying health state, unspecified: Secondary | ICD-10-CM | POA: Diagnosis not present

## 2022-12-19 ENCOUNTER — Encounter: Payer: Self-pay | Admitting: Pharmacist Clinician (PhC)/ Clinical Pharmacy Specialist

## 2022-12-19 ENCOUNTER — Other Ambulatory Visit (HOSPITAL_COMMUNITY): Payer: Self-pay

## 2022-12-19 ENCOUNTER — Ambulatory Visit: Payer: 59 | Attending: Cardiology | Admitting: Pharmacist Clinician (PhC)/ Clinical Pharmacy Specialist

## 2022-12-19 VITALS — BP 136/90 | HR 62

## 2022-12-19 DIAGNOSIS — I700 Atherosclerosis of aorta: Secondary | ICD-10-CM

## 2022-12-19 DIAGNOSIS — I10 Essential (primary) hypertension: Secondary | ICD-10-CM | POA: Diagnosis not present

## 2022-12-19 MED ORDER — ROSUVASTATIN CALCIUM 40 MG PO TABS: 40.0000 mg | ORAL_TABLET | Freq: Every day | ORAL | 3 refills | Status: DC

## 2022-12-19 MED ORDER — SPIRONOLACTONE 25 MG PO TABS: 25.0000 mg | ORAL_TABLET | Freq: Every day | ORAL | 3 refills | Status: DC

## 2022-12-19 NOTE — Assessment & Plan Note (Signed)
Assessment: BP is uncontrolled in office BP 136/90 mmHg;  above the goal (<130/80). Compliant with current medications Tolerates meds well without any side effects Denies SOB, palpitation, chest pain, headaches,or swelling Reiterated the importance of regular exercise and low salt diet   Plan:  Increase spironolactone to 25 mg once daily Continue taking amlodipine 10 mg, lisinopril 40 mg, chlorthalidone 25 mg - all once daily Patient to keep record of BP readings with heart rate and report to [place] at the next visit Patient to follow up with PharmD in 3 months  Labs ordered today:  none

## 2022-12-19 NOTE — Patient Instructions (Signed)
Follow up appointment: Tuesday March 18 at 8:30 am  Take your BP meds as follows:  Increase spironolactone to 1 tablet each day  Continue with all other medications  Check your blood pressure at home 3-4 days each week and keep record of the readings.  Your blood pressure goal is  130/80  To check your pressure at home you will need to:  1. Sit up in a chair, with feet flat on the floor and back supported. Do not cross your ankles or legs. 2. Rest your left arm so that the cuff is about heart level. If the cuff goes on your upper arm,  then just relax the arm on the table, arm of the chair or your lap. If you have a wrist cuff, we  suggest relaxing your wrist against your chest (think of it as Pledging the Flag with the  wrong arm).  3. Place the cuff snugly around your arm, about 1 inch above the crook of your elbow. The  cords should be inside the groove of your elbow.  4. Sit quietly, with the cuff in place, for about 5 minutes. After that 5 minutes press the power  button to start a reading. 5. Do not talk or move while the reading is taking place.  6. Record your readings on a sheet of paper. Although most cuffs have a memory, it is often  easier to see a pattern developing when the numbers are all in front of you.  7. You can repeat the reading after 1-3 minutes if it is recommended  Make sure your bladder is empty and you have not had caffeine or tobacco within the last 30 min  Always bring your blood pressure log with you to your appointments. If you have not brought your monitor in to be double checked for accuracy, please bring it to your next appointment.  You can find a list of quality blood pressure cuffs at [email or web address]  Important lifestyle changes to control high blood pressure  Intervention  Effect on the BP  Lose extra pounds and watch your waistline Weight loss is one of the most effective lifestyle changes for controlling blood pressure. If you're overweight  or obese, losing even a small amount of weight can help reduce blood pressure. Blood pressure might go down by about 1 millimeter of mercury (mm Hg) with each kilogram (about 2.2 pounds) of weight lost.  Exercise regularly As a general goal, aim for at least 30 minutes of moderate physical activity every day. Regular physical activity can lower high blood pressure by about 5 to 8 mm Hg.  Eat a healthy diet Eating a diet rich in whole grains, fruits, vegetables, and low-fat dairy products and low in saturated fat and cholesterol. A healthy diet can lower high blood pressure by up to 11 mm Hg.  Reduce salt (sodium) in your diet Even a small reduction of sodium in the diet can improve heart health and reduce high blood pressure by about 5 to 6 mm Hg.  Limit alcohol One drink equals 12 ounces of beer, 5 ounces of wine, or 1.5 ounces of 80-proof liquor.  Limiting alcohol to less than one drink a day for women or two drinks a day for men can help lower blood pressure by about 4 mm Hg.   If you have any questions or concerns please use My Chart to send questions or call the office at [phone]

## 2022-12-19 NOTE — Progress Notes (Unsigned)
Office Visit    Patient Name: Ethan Silva Date of Encounter: 12/20/2022  Primary Care Provider:  Georgina Quint, MD Primary Cardiologist:  Bryan Lemma, MD  Chief Complaint    Hyperlipidemia, hypertension  Significant Past Medical History   HTN Amlodipine increased to 10 mg at last visit, also lisinopril  CAD 7/22 cath - 80% 3rd OM, mid LAD 30-40%, prox RCA 30%  preDM 5/24 A1c 6.3     No Known Allergies  History of Present Illness    Ethan Silva is a 57 y.o. male patient of Dr Herbie Baltimore, in the office today to discuss options for managing cholesterol as well as hypertension.   He comes with a [place] interpreter.  He saw Dr. Herbie Baltimore last month, BP was at 154/98 and amlodipine was increased to 10 mg.   He states he has checked home readings several times and that they are still running higher than goal.  For his cholesterol he was prescribed rosuvastatin and Nexlizet.  From conversation it sounds like he was only taking the Nexlizet, but it became cost prohibitive and he then re-started rosuvastatin.  He has been on that for only 3 days now.    At my last visit with him in July, pressure was better at 135/94.  Chlorthalidone 25 mg daily was added to his lisinopril and amlodipine.  Spironolactone was later added and at follow up with Dr. Herbie Baltimore in October, BP was 144/84.  It was noted that he tends toward white coat hypertension, with home readings being more in the normal range.    Today he returns for follow up.  States feeling well, although does have occasional dizziness when he first gets up in the morning.  Has been checking blood pressures at home and although systolic is much closer to goal, diastolic still running in the high 80-low 90's.    BP goal:  < 130/80  Current Medications:   rosuvastatin 40 mg   Amlodipine 10 mg qd, (pm) lisinopril 40 mg every day (am), chlorthalidone 25 mg every day, spironolactone 12.5 mg every day    Family Hx:  mother with high BP, not well controlled; father deceased - gall bladder surgery complications; 1 brother with hypertension; youngest has BP and palpitations   Social Hx: Tobacco:  no Alcohol:  no   Caffeine: only mint tea on occasion  Diet:   mostly ome cooked meals; grilled chicken, or makes broth with vegetables; doesn't snack much, but occasional crackers   Exercise: on his feet, moving around all day - works as a Education administrator  BP readings: Home Omron device -  Read within 10 points of office reading  AM 21 readings average 131/89 HR 66  (previous avg 135/91)  PM 21 readings average 130/86 HR 65  (previous avg 135/85(   Accessory Clinical Findings   Lab Results  Component Value Date   CHOL 222 (H) 11/13/2022   HDL 43.10 11/13/2022   LDLCALC 133 (H) 11/13/2022   TRIG 230.0 (H) 11/13/2022   CHOLHDL 5 11/13/2022    Lipoprotein (a)  Date/Time Value Ref Range Status  05/14/2022 09:20 AM 38.0 <75.0 nmol/L Final    Comment:    Note:  Values greater than or equal to 75.0 nmol/L may        indicate an independent risk factor for CHD,        but must be evaluated with caution when applied        to non-Caucasian populations due to  the        influence of genetic factors on Lp(a) across        ethnicities.     Lab Results  Component Value Date   ALT 27 11/13/2022   AST 19 11/13/2022   ALKPHOS 39 11/13/2022   BILITOT 0.5 11/13/2022   Lab Results  Component Value Date   CREATININE 0.92 11/13/2022   BUN 17 11/13/2022   NA 141 11/13/2022   K 3.7 11/13/2022   CL 104 11/13/2022   CO2 26 11/13/2022   Lab Results  Component Value Date   HGBA1C 6.2 11/13/2022    Home Medications    Current Outpatient Medications  Medication Sig Dispense Refill   spironolactone (ALDACTONE) 25 MG tablet Take 1 tablet (25 mg total) by mouth daily. 90 tablet 3   acetaminophen (TYLENOL) 325 MG tablet Take 325 mg by mouth every 6 (six) hours as needed for moderate pain.      amLODipine (NORVASC) 10 MG tablet Take 1 tablet (10 mg total) by mouth daily. 90 tablet 3   aspirin 81 MG EC tablet Take 1 tablet (81 mg total) by mouth daily. Swallow whole. 90 tablet 3   chlorthalidone (HYGROTON) 25 MG tablet Take 1 tablet (25 mg total) by mouth daily. 90 tablet 3   gabapentin (NEURONTIN) 300 MG capsule Take 1 capsule (300 mg total) by mouth 3 (three) times daily. 90 capsule 0   lisinopril (ZESTRIL) 40 MG tablet Take 1 tablet (40 mg total) by mouth daily. 90 tablet 3   nitroGLYCERIN (NITROSTAT) 0.4 MG SL tablet Place 1 tablet (0.4 mg total) under the tongue every 5 (five) minutes x 3 doses as needed for chest pain. 25 tablet 6   pantoprazole (PROTONIX) 40 MG tablet Take 1 tablet by mouth once daily 90 tablet 3   polyethylene glycol powder (GLYCOLAX/MIRALAX) 17 GM/SCOOP powder Take 17 g by mouth 2 (two) times daily as needed. 3350 g 1   rosuvastatin (CRESTOR) 40 MG tablet Take 1 tablet (40 mg total) by mouth daily. 90 tablet 3   No current facility-administered medications for this visit.      Assessment & Plan    Essential hypertension Assessment: BP is uncontrolled in office BP 136/90 mmHg;  above the goal (<130/80). Compliant with current medications Tolerates meds well without any side effects Denies SOB, palpitation, chest pain, headaches,or swelling Reiterated the importance of regular exercise and low salt diet   Plan:  Increase spironolactone to 25 mg once daily Continue taking amlodipine 10 mg, lisinopril 40 mg, chlorthalidone 25 mg - all once daily Patient to keep record of BP readings with heart rate and report to [place] at the next visit Patient to follow up with PharmD in 3 months  Labs ordered today:  none   Phillips Hay PharmD CPP [facility]  [address] [place], [place] [address] [phone]

## 2022-12-22 ENCOUNTER — Other Ambulatory Visit: Payer: Self-pay

## 2022-12-22 ENCOUNTER — Emergency Department (HOSPITAL_BASED_OUTPATIENT_CLINIC_OR_DEPARTMENT_OTHER)
Admission: EM | Admit: 2022-12-22 | Discharge: 2022-12-22 | Disposition: A | Discharge status: Home / Self Care | Payer: 59 | Attending: Emergency Medicine | Admitting: Emergency Medicine

## 2022-12-22 DIAGNOSIS — Z7982 Long term (current) use of aspirin: Secondary | ICD-10-CM | POA: Diagnosis not present

## 2022-12-22 DIAGNOSIS — I10 Essential (primary) hypertension: Secondary | ICD-10-CM | POA: Insufficient documentation

## 2022-12-22 DIAGNOSIS — R7303 Prediabetes: Secondary | ICD-10-CM | POA: Insufficient documentation

## 2022-12-22 DIAGNOSIS — Z79899 Other long term (current) drug therapy: Secondary | ICD-10-CM | POA: Insufficient documentation

## 2022-12-22 DIAGNOSIS — K0889 Other specified disorders of teeth and supporting structures: Secondary | ICD-10-CM | POA: Diagnosis present

## 2022-12-22 DIAGNOSIS — I2510 Atherosclerotic heart disease of native coronary artery without angina pectoris: Secondary | ICD-10-CM | POA: Diagnosis not present

## 2022-12-22 MED ORDER — IBUPROFEN 400 MG PO TABS
400.0000 mg | ORAL_TABLET | Freq: Once | ORAL | Status: AC
  Administered 2022-12-22: 400 mg via ORAL
  Filled 2022-12-22: qty 1

## 2022-12-22 MED ORDER — TRAMADOL HCL 50 MG PO TABS: 50.0000 mg | ORAL_TABLET | Freq: Four times a day (QID) | ORAL | 0 refills | Status: DC | PRN

## 2022-12-22 MED ORDER — AMOXICILLIN 500 MG PO CAPS
1000.0000 mg | ORAL_CAPSULE | Freq: Once | ORAL | Status: AC
  Administered 2022-12-22: 1000 mg via ORAL
  Filled 2022-12-22: qty 2

## 2022-12-22 MED ORDER — AMOXICILLIN 500 MG PO CAPS
1000.0000 mg | ORAL_CAPSULE | Freq: Two times a day (BID) | ORAL | 0 refills | Status: DC
Start: 2022-12-22 — End: 2023-05-21

## 2022-12-22 MED ORDER — ACETAMINOPHEN 325 MG PO TABS
650.0000 mg | ORAL_TABLET | Freq: Once | ORAL | Status: AC
  Administered 2022-12-22: 650 mg via ORAL
  Filled 2022-12-22: qty 2

## 2022-12-22 NOTE — ED Triage Notes (Signed)
Pt POV reporting L side dental pain x3 days, has taken tylenol w no improvement.

## 2022-12-22 NOTE — ED Provider Notes (Signed)
[place] EMERGENCY DEPARTMENT AT [facility] Provider Note   CSN: [id number] Arrival date & time: 12/22/22  0153     History  Chief Complaint  Patient presents with   Dental Pain    Ethan Silva is a 57 y.o. male.  The history is provided by the patient. A language interpreter was used.  Dental Pain He has history of hypertension, prediabetes, hyperlipidemia, coronary artery disease and he complains of pain in the right side of his mouth for the last 3 days.  He thinks that it is more in the upper.  He tried to get into see a dentist today, and the office was supposed to call him back, but did not.  He has taken acetaminophen for pain with very little relief.   Home Medications Prior to Admission medications   Medication Sig Start Date End Date Taking? Authorizing Provider  acetaminophen (TYLENOL) 325 MG tablet Take 325 mg by mouth every 6 (six) hours as needed for moderate pain.    [provider]  amLODipine (NORVASC) 10 MG tablet Take 1 tablet (10 mg total) by mouth daily. 11/26/22 02/24/23  Marykay Lex, MD  aspirin 81 MG EC tablet Take 1 tablet (81 mg total) by mouth daily. Swallow whole. 07/15/20   O'NealRonnald Ramp, MD  chlorthalidone (HYGROTON) 25 MG tablet Take 1 tablet (25 mg total) by mouth daily. 11/26/22 02/24/23  Marykay Lex, MD  gabapentin (NEURONTIN) 300 MG capsule Take 1 capsule (300 mg total) by mouth 3 (three) times daily. 04/25/22 05/25/22  London Sheer, MD  lisinopril (ZESTRIL) 40 MG tablet Take 1 tablet (40 mg total) by mouth daily. 11/26/22 02/24/23  Marykay Lex, MD  nitroGLYCERIN (NITROSTAT) 0.4 MG SL tablet Place 1 tablet (0.4 mg total) under the tongue every 5 (five) minutes x 3 doses as needed for chest pain. 09/10/22   Marykay Lex, MD  pantoprazole (PROTONIX) 40 MG tablet Take 1 tablet by mouth once daily 05/14/22   Marykay Lex, MD  polyethylene glycol powder [facility]) 17 GM/SCOOP powder  Take 17 g by mouth 2 (two) times daily as needed. 10/11/20   Corwin Levins, MD  rosuvastatin (CRESTOR) 40 MG tablet Take 1 tablet (40 mg total) by mouth daily. 12/19/22 12/14/23  Marykay Lex, MD  spironolactone (ALDACTONE) 25 MG tablet Take 1 tablet (25 mg total) by mouth daily. 12/19/22 03/19/23  Marykay Lex, MD      Allergies    Patient has no known allergies.    Review of Systems   Review of Systems  All other systems reviewed and are negative.   Physical Exam Updated Vital Signs BP 136/88   Pulse 63   Temp 97.8 F (36.6 C) (Oral)   Resp 17   SpO2 95%  Physical Exam Vitals and nursing note reviewed.   57 year old male, resting comfortably and in no acute distress. Vital signs are normal. Oxygen saturation is 95%, which is normal. Head is normocephalic and atraumatic. PERRLA, EOMI. Oropharynx is clear.  Tooth #1 is only partially erupted and is tender to percussion.  No other dental caries are seen although several teeth are missing. Neck is nontender and supple without adenopathy. Lungs are clear without rales, wheezes, or rhonchi. Chest is nontender. Heart has regular rate and rhythm without murmur. Abdomen is soft, flat, nontender. Skin is warm and dry without rash. Neurologic: Mental status is normal, moves all extremities equally.  ED Results / Procedures /  Treatments    Procedures Procedures    Medications Ordered in ED Medications  acetaminophen (TYLENOL) tablet 650 mg (650 mg Oral Given 12/22/22 0345)  ibuprofen (ADVIL) tablet 400 mg (400 mg Oral Given 12/22/22 0346)  amoxicillin (AMOXIL) capsule 1,000 mg (1,000 mg Oral Given 12/22/22 0345)    ED Course/ Medical Decision Making/ A&P                                 Medical Decision Making Risk OTC drugs. Prescription drug management.   Dental pain from impacted tooth #1.  I have ordered a dose of amoxicillin, ibuprofen, acetaminophen.  I am referring him to the on-call dentist and I am  discharging him with prescriptions for amoxicillin and tramadol.  Final Clinical Impression(s) / ED Diagnoses Final diagnoses:  Pain, dental    Rx / DC Orders ED Discharge Orders          Ordered    amoxicillin (AMOXIL) 500 MG capsule  2 times daily        12/22/22 0341    traMADol (ULTRAM) 50 MG tablet  Every 6 hours PRN        12/22/22 0341              Dione Booze, MD 12/22/22 [phone]

## 2022-12-22 NOTE — Discharge Instructions (Addendum)
Creo que tu dolor proviene de tu muela del juicio. Probablemente ser necesario extraerlo. Le estoy remitiendo a un dentista, pero es posible que l quiera que consulte a un cirujano bucal.  Puede tomar ibuprofeno o naproxeno segn sea necesario para Chief Technology Officer. Para obtener un alivio adicional del dolor, agregue paracetamol. Si todava siente mucho dolor, agregue tramadol.

## 2023-01-02 DIAGNOSIS — Z419 Encounter for procedure for purposes other than remedying health state, unspecified: Secondary | ICD-10-CM | POA: Diagnosis not present

## 2023-02-02 DIAGNOSIS — Z419 Encounter for procedure for purposes other than remedying health state, unspecified: Secondary | ICD-10-CM | POA: Diagnosis not present

## 2023-03-02 DIAGNOSIS — Z419 Encounter for procedure for purposes other than remedying health state, unspecified: Secondary | ICD-10-CM | POA: Diagnosis not present

## 2023-03-19 ENCOUNTER — Encounter: Payer: Self-pay | Admitting: Pharmacist

## 2023-03-19 ENCOUNTER — Ambulatory Visit: Payer: 59 | Attending: Internal Medicine | Admitting: Pharmacist

## 2023-03-19 VITALS — BP 132/82 | HR 65

## 2023-03-19 DIAGNOSIS — I700 Atherosclerosis of aorta: Secondary | ICD-10-CM | POA: Diagnosis not present

## 2023-03-19 DIAGNOSIS — I10 Essential (primary) hypertension: Secondary | ICD-10-CM

## 2023-03-19 DIAGNOSIS — I2510 Atherosclerotic heart disease of native coronary artery without angina pectoris: Secondary | ICD-10-CM | POA: Diagnosis not present

## 2023-03-19 MED ORDER — AMLODIPINE BESYLATE 10 MG PO TABS: 10.0000 mg | ORAL_TABLET | Freq: Every day | ORAL | 3 refills | Status: AC

## 2023-03-19 MED ORDER — CHLORTHALIDONE 25 MG PO TABS
25.0000 mg | ORAL_TABLET | Freq: Every day | ORAL | 3 refills | Status: AC
Start: 2023-03-19 — End: 2023-08-14

## 2023-03-19 MED ORDER — ROSUVASTATIN CALCIUM 40 MG PO TABS
40.0000 mg | ORAL_TABLET | Freq: Every day | ORAL | 3 refills | Status: DC
Start: 2023-03-19 — End: 2023-06-28

## 2023-03-19 MED ORDER — SPIRONOLACTONE 25 MG PO TABS
25.0000 mg | ORAL_TABLET | Freq: Every day | ORAL | 3 refills | Status: DC
Start: 2023-03-19 — End: 2023-06-20

## 2023-03-19 MED ORDER — LISINOPRIL 40 MG PO TABS: 40.0000 mg | ORAL_TABLET | Freq: Every day | ORAL | 3 refills | Status: AC

## 2023-03-19 NOTE — Progress Notes (Signed)
 Patient ID: Ethan Silva                 DOB: [birth year]                      MRN: [id number]     HPI: Ethan Silva is a 58 y.o. male referred by Dr. Herbie Baltimore to HTN clinic. PMH is significant for HTN, CAD, aortic atherosclerosis, HLD, and preDM. Spironolactone increased at last visit.  Patient presents today for HTN f/u. Translating service used. [name] #[id number]   Reports no adverse effects to spironolactone increase and believes it is helping his BP. Reports home systolic readings between 120-135 but did not bring cuff or log.  Noticed his blood pressure was higher when he had a back injury and more recently when he had a cough. Only uses Tylenol for pain.  Reports dizziness when standing occasionally.  Current HTN meds:  Amlodipine 10mg  daily Chlorthalidone 25mg  daily Lisinopril 40mg  dily Spironolactone 25mg  daily   BP goal: <130/80  Wt Readings from Last 3 Encounters:  11/26/22 168 lb 9.6 oz (76.5 kg)  11/13/22 166 lb 6 oz (75.5 kg)  10/15/22 167 lb 12.8 oz (76.1 kg)   BP Readings from Last 3 Encounters:  12/22/22 136/88  12/19/22 (!) 136/90  11/26/22 (!) 144/84   Pulse Readings from Last 3 Encounters:  12/22/22 63  12/19/22 62  11/26/22 69    Renal function: CrCl cannot be calculated (Patient's most recent lab result is older than the maximum 21 days allowed.).  Past Medical History:  Diagnosis Date   Aortic stenosis, mild 07/2012   Very mild aortic stenosis noted on echo-mean gradient 11 mmHg.   Bile duct leak    CAD in native artery 07/14/2020   Small branch of OM 3 with 80% ostial stenosis.  Too small for PCI.  Otherwise questionable 30% LM and 30% PDA.   Chronic kidney disease 2017   left kidney stones   Headache(784.0)    Hx: of when BP is elevated   Hyperlipidemia    Hypertension    Non-STEMI (non-ST elevated myocardial infarction) (HCC) 07/13/2020   Admitted with hypertension and chest pain.  Cardiac cath showed small  branch of an OM3 with 85% ostial stenosis not amenable to PCI.  No other significant disease noted.  Normal EF on echo.  Medical management.   Numbness and tingling in hands    Hx: of   Prediabetes     Current Outpatient Medications on File Prior to Visit  Medication Sig Dispense Refill   acetaminophen (TYLENOL) 325 MG tablet Take 325 mg by mouth every 6 (six) hours as needed for moderate pain.     amLODipine (NORVASC) 10 MG tablet Take 1 tablet (10 mg total) by mouth daily. 90 tablet 3   amoxicillin (AMOXIL) 500 MG capsule Take 2 capsules (1,000 mg total) by mouth 2 (two) times daily. 40 capsule 0   aspirin 81 MG EC tablet Take 1 tablet (81 mg total) by mouth daily. Swallow whole. 90 tablet 3   chlorthalidone (HYGROTON) 25 MG tablet Take 1 tablet (25 mg total) by mouth daily. 90 tablet 3   gabapentin (NEURONTIN) 300 MG capsule Take 1 capsule (300 mg total) by mouth 3 (three) times daily. 90 capsule 0   lisinopril (ZESTRIL) 40 MG tablet Take 1 tablet (40 mg total) by mouth daily. 90 tablet 3   nitroGLYCERIN (NITROSTAT) 0.4 MG SL tablet Place 1 tablet (0.4 mg total) under  the tongue every 5 (five) minutes x 3 doses as needed for chest pain. 25 tablet 6   pantoprazole (PROTONIX) 40 MG tablet Take 1 tablet by mouth once daily 90 tablet 3   polyethylene glycol powder (GLYCOLAX/MIRALAX) 17 GM/SCOOP powder Take 17 g by mouth 2 (two) times daily as needed. 3350 g 1   rosuvastatin (CRESTOR) 40 MG tablet Take 1 tablet (40 mg total) by mouth daily. 90 tablet 3   spironolactone (ALDACTONE) 25 MG tablet Take 1 tablet (25 mg total) by mouth daily. 90 tablet 3   traMADol (ULTRAM) 50 MG tablet Take 1 tablet (50 mg total) by mouth every 6 (six) hours as needed. 15 tablet 0   No current facility-administered medications on file prior to visit.    No Known Allergies   Assessment/Plan:  1. Hypertension -   Patient BP improved today at 132/82. Slightly above goal. Home readings sound more controlled . No  medication changes needed today.  Advised that he may have some orthostatic hypotension. Advised to stand up slowly and make sure to stay hydrated.  Continue: Amlodipine 10mg  daily Chlorthalidone 25mg  daily Lisinopril 40mg  daily Spironolactone 25mg  daily F/u in 2 months  Laural Golden, PharmD, BCACP, CDCES, CPP [address] [place], [place], [address] Phone: [phone], Fax: [phone]

## 2023-03-19 NOTE — Patient Instructions (Addendum)
 It was nice meeting you today ' Your blood pressure looks much better today  Please continue your: Amlodipine 10mg  daily Chlorthalidone 25mg  daily Lisinopril 40mg  daily Spironolactone 25mg  daily  I have also refilled your rosuvastatin for you  Please let us know if you have any questions and we will see you back in 2 months  Laural Golden, PharmD, BCACP, CDCES, CPP [address] [place], [place], [address] Phone: [phone], Fax: [phone]

## 2023-03-21 ENCOUNTER — Other Ambulatory Visit (HOSPITAL_COMMUNITY): Payer: Self-pay

## 2023-03-21 ENCOUNTER — Ambulatory Visit (INDEPENDENT_AMBULATORY_CARE_PROVIDER_SITE_OTHER)

## 2023-03-21 ENCOUNTER — Ambulatory Visit (INDEPENDENT_AMBULATORY_CARE_PROVIDER_SITE_OTHER): Admitting: Emergency Medicine

## 2023-03-21 ENCOUNTER — Encounter: Payer: Self-pay | Admitting: Emergency Medicine

## 2023-03-21 VITALS — BP 138/92 | HR 61 | Temp 98.3°F | Ht 63.0 in | Wt 164.0 lb

## 2023-03-21 DIAGNOSIS — R053 Chronic cough: Secondary | ICD-10-CM | POA: Diagnosis not present

## 2023-03-21 DIAGNOSIS — J22 Unspecified acute lower respiratory infection: Secondary | ICD-10-CM

## 2023-03-21 MED ORDER — BENZONATATE 200 MG PO CAPS: 200.0000 mg | ORAL_CAPSULE | Freq: Two times a day (BID) | ORAL | 0 refills | Status: AC | PRN

## 2023-03-21 MED ORDER — AZITHROMYCIN 250 MG PO TABS: ORAL_TABLET | ORAL | 0 refills | Status: DC

## 2023-03-21 NOTE — Assessment & Plan Note (Signed)
 Clinically stable.  No red flag signs or symptoms No pneumonia on chest x-ray Viral respiratory infection now with secondary bacterial infection Recommend daily azithromycin for 5 days. Symptom management discussed Advised to rest and stay well-hydrated Advised to contact the office if no better or worse during the next several days

## 2023-03-21 NOTE — Progress Notes (Signed)
 Ethan Silva 58 y.o.   Chief Complaint  Patient presents with   Cough    Patient states having a cough/chest congestion for about 3 weeks. Mentions lower back and right leg pain. And a slight sore throat. He is taking theraflu but it is not helping     HISTORY OF PRESENT ILLNESS: This is a 58 y.o. male complaining of persistent cough for the last 3 weeks No other associated symptoms.  Non-smoker.   HPI   Prior to Admission medications   Medication Sig Start Date End Date Taking? Authorizing Provider  acetaminophen (TYLENOL) 325 MG tablet Take 325 mg by mouth every 6 (six) hours as needed for moderate pain.   Yes [provider]  amLODipine (NORVASC) 10 MG tablet Take 1 tablet (10 mg total) by mouth daily. 03/19/23 06/17/23 Yes Marykay Lex, MD  chlorthalidone (HYGROTON) 25 MG tablet Take 1 tablet (25 mg total) by mouth daily. 03/19/23 06/17/23 Yes Marykay Lex, MD  lisinopril (ZESTRIL) 40 MG tablet Take 1 tablet (40 mg total) by mouth daily. 03/19/23 06/17/23 Yes Marykay Lex, MD  rosuvastatin (CRESTOR) 40 MG tablet Take 1 tablet (40 mg total) by mouth daily. 03/19/23 03/13/24 Yes Marykay Lex, MD  spironolactone (ALDACTONE) 25 MG tablet Take 1 tablet (25 mg total) by mouth daily. 03/19/23 06/17/23 Yes Marykay Lex, MD  amoxicillin (AMOXIL) 500 MG capsule Take 2 capsules (1,000 mg total) by mouth 2 (two) times daily. Patient not taking: Reported on 03/21/2023 12/22/22   Dione Booze, MD  aspirin 81 MG EC tablet Take 1 tablet (81 mg total) by mouth daily. Swallow whole. Patient not taking: Reported on 03/21/2023 07/15/20   Sande Rives, MD  gabapentin (NEURONTIN) 300 MG capsule Take 1 capsule (300 mg total) by mouth 3 (three) times daily. 04/25/22 05/25/22  London Sheer, MD  nitroGLYCERIN (NITROSTAT) 0.4 MG SL tablet Place 1 tablet (0.4 mg total) under the tongue every 5 (five) minutes x 3 doses as needed for chest pain. Patient not taking:  Reported on 03/21/2023 09/10/22   Marykay Lex, MD  pantoprazole (PROTONIX) 40 MG tablet Take 1 tablet by mouth once daily Patient not taking: Reported on 03/21/2023 05/14/22   Marykay Lex, MD  polyethylene glycol powder (GLYCOLAX/MIRALAX) 17 GM/SCOOP powder Take 17 g by mouth 2 (two) times daily as needed. Patient not taking: Reported on 03/21/2023 10/11/20   Corwin Levins, MD  traMADol (ULTRAM) 50 MG tablet Take 1 tablet (50 mg total) by mouth every 6 (six) hours as needed. Patient not taking: Reported on 03/21/2023 12/22/22   Dione Booze, MD    No Known Allergies  Patient Active Problem List   Diagnosis Date Noted   Unilateral primary osteoarthritis, left knee 11/15/2022   Chronic pain of left knee 11/13/2022   Mild aortic stenosis by prior echocardiogram 05/14/2022   Back pain of lumbar region with sciatica 04/16/2022   Degenerative disc disease, lumbar 04/16/2022   Lumbosacral pain 03/27/2022   Lumbosacral strain 03/21/2022   Musculoskeletal pain 03/21/2022   Chronic foot pain, right 10/30/2021   Calcaneal spur of right foot 10/30/2021   Aortic atherosclerosis (HCC) 04/25/2021   Coronary artery disease involving native coronary artery of native heart without angina pectoris 07/15/2020   Prediabetes 07/15/2020   BMI 30.0-30.9,adult 08/30/2015   Hyperlipidemia with target low density lipoprotein (LDL) cholesterol less than 55 mg/dL 44/01/270   Essential hypertension 10/11/2012    Past Medical History:  Diagnosis Date  Aortic stenosis, mild 07/2012   Very mild aortic stenosis noted on echo-mean gradient 11 mmHg.   Bile duct leak    CAD in native artery 07/14/2020   Small branch of OM 3 with 80% ostial stenosis.  Too small for PCI.  Otherwise questionable 30% LM and 30% PDA.   Chronic kidney disease 2017   left kidney stones   Headache(784.0)    Hx: of when BP is elevated   Hyperlipidemia    Hypertension    Non-STEMI (non-ST elevated myocardial infarction) (HCC)  07/13/2020   Admitted with hypertension and chest pain.  Cardiac cath showed small branch of an OM3 with 85% ostial stenosis not amenable to PCI.  No other significant disease noted.  Normal EF on echo.  Medical management.   Numbness and tingling in hands    Hx: of   Prediabetes     Past Surgical History:  Procedure Laterality Date   BILIARY DILATION  03/16/2021   Procedure: BILIARY DILATION;  Surgeon: Rachael Fee, MD;  Location: [facility];  Service: Endoscopy;;   CHOLECYSTECTOMY N/A 07/11/2012   Procedure: LAPAROSCOPIC CHOLECYSTECTOMY WITH INTRAOPERATIVE CHOLANGIOGRAM;  Surgeon: Axel Filler, MD;  Location: [facility] OR;  Service: General;  Laterality: N/A;   COLONOSCOPY     ENDOSCOPIC RETROGRADE CHOLANGIOPANCREATOGRAPHY (ERCP) WITH PROPOFOL N/A 03/16/2021   Procedure: ENDOSCOPIC RETROGRADE CHOLANGIOPANCREATOGRAPHY (ERCP) WITH PROPOFOL;  Surgeon: Rachael Fee, MD;  Location: [facility];  Service: Endoscopy;  Laterality: N/A;   ERCP  07/16/2012   ERCP N/A 07/16/2012   Procedure: ENDOSCOPIC RETROGRADE CHOLANGIOPANCREATOGRAPHY (ERCP);  Surgeon: Louis Meckel, MD;  Location: [facility] OR;  Service: Gastroenterology;  Laterality: N/A;   ERCP N/A 10/06/2012   Procedure: ENDOSCOPIC RETROGRADE CHOLANGIOPANCREATOGRAPHY (ERCP);  Surgeon: Louis Meckel, MD;  Location: [facility];  Service: Endoscopy;  Laterality: N/A;   ESOPHAGOGASTRODUODENOSCOPY N/A 03/16/2021   Procedure: ESOPHAGOGASTRODUODENOSCOPY (EGD);  Surgeon: Rachael Fee, MD;  Location: [facility];  Service: Endoscopy;  Laterality: N/A;   EUS N/A 03/16/2021   Procedure: UPPER ENDOSCOPIC ULTRASOUND (EUS) RADIAL;  Surgeon: Rachael Fee, MD;  Location: [facility];  Service: Endoscopy;  Laterality: N/A;   HAND SURGERY     LEFT HEART CATH AND CORONARY ANGIOGRAPHY N/A 07/14/2020   Procedure: LEFT HEART CATH AND CORONARY ANGIOGRAPHY;  Surgeon: Lyn Records, MD;  Location: [facility];  Service: Cardiovascular; ? Culprit  Lesion ~ 80% ostial small OM3 (too small & ostial for PCI). ~30% mid LM (at a bend).  LAD with D1 & D2 - normal, RCA minimal luminal irregularities ~ 30%. PDA   SPHINCTEROTOMY  03/16/2021   Procedure: SPHINCTEROTOMY;  Surgeon: Rachael Fee, MD;  Location: [facility];  Service: Endoscopy;;   TRANSTHORACIC ECHOCARDIOGRAM  07/14/2020   (NSTEMI/Accelerated Hypertension): EF 55 to 60%.  No RWM A.  Very mild Aortic Stenosis-mean gradient 11 to mmHg.    Social History   Socioeconomic History   Marital status: Married    Spouse name: Davonna Belling   Number of children: 3   Years of education: 6th grade   Highest education level: Not on file  Occupational History   Occupation: Painting    Employer: [employer], [place], [place]    Comment: Mostly indoor painting  Tobacco Use   Smoking status: Never    Passive exposure: Never   Smokeless tobacco: Never  Vaping Use   Vaping status: Never Used  Substance and Sexual Activity   Alcohol use: No   Drug use: No  Sexual activity: Yes    Partners: Female  Other Topics Concern   Not on file  Social History Narrative   Originally from [place], [place]. Came to the [place] in 1991.   Lives with his wife and their youngest son   His daughter lives in [place], [place] with her husband.  Second son is now moved out of the house as well.   He gets a decent amount of exercise walking around at work, but does not do routine exercise.  Does not drink or smoke   Social Drivers of Corporate investment banker Strain: Not on file  Food Insecurity: Not on file  Transportation Needs: Not on file  Physical Activity: Not on file  Stress: Not on file  Social Connections: Not on file  Intimate Partner Violence: Not on file    Family History  Problem Relation Age of Onset   Gallstones Mother    Hypertension Mother    Gallstones Brother    Diabetes Paternal Uncle      Review of Systems  Constitutional: Negative.  Negative for chills and  fever.  HENT: Negative.  Negative for congestion and sore throat.   Respiratory:  Positive for cough and sputum production.   Cardiovascular: Negative.  Negative for chest pain.  Gastrointestinal:  Negative for abdominal pain, diarrhea, nausea and vomiting.  Genitourinary: Negative.  Negative for dysuria and hematuria.  Skin: Negative.  Negative for rash.  Neurological: Negative.  Negative for dizziness and headaches.  All other systems reviewed and are negative.   Vitals:   03/21/23 1416  BP: (!) 138/92  Pulse: 61  Temp: 98.3 F (36.8 C)  SpO2: 94%    Physical Exam Vitals reviewed.  Constitutional:      Appearance: Normal appearance.  HENT:     Head: Normocephalic.     Mouth/Throat:     Mouth: Mucous membranes are moist.     Pharynx: Oropharynx is clear.  Eyes:     Extraocular Movements: Extraocular movements intact.     Conjunctiva/sclera: Conjunctivae normal.     Pupils: Pupils are equal, round, and reactive to light.  Cardiovascular:     Rate and Rhythm: Normal rate and regular rhythm.     Pulses: Normal pulses.     Heart sounds: Normal heart sounds.  Pulmonary:     Effort: Pulmonary effort is normal.     Breath sounds: Normal breath sounds.  Abdominal:     Palpations: Abdomen is soft.     Tenderness: There is no abdominal tenderness.  Musculoskeletal:     Cervical back: No tenderness.  Lymphadenopathy:     Cervical: No cervical adenopathy.  Skin:    General: Skin is warm and dry.     Capillary Refill: Capillary refill takes less than 2 seconds.  Neurological:     General: No focal deficit present.     Mental Status: He is alert and oriented to person, place, and time.  Psychiatric:        Mood and Affect: Mood normal.        Behavior: Behavior normal.    DG Chest 2 View Result Date: 03/21/2023 CLINICAL DATA:  Cough and chest pain for 3 weeks. EXAM: CHEST - 2 VIEW COMPARISON:  08/17/2021 FINDINGS: The heart size and mediastinal contours are within normal  limits. Both lungs are clear. The visualized skeletal structures are unremarkable. IMPRESSION: No active cardiopulmonary disease. Electronically Signed   By: Danae Orleans M.D.   On: 03/21/2023 15:36  ASSESSMENT & PLAN: A total of 33 minutes was spent with the patient and counseling/coordination of care regarding preparing for this visit, review of most recent office visit notes, review of multiple chronic medical conditions and their management, diagnosis of lower respiratory infection and need for antibiotic treatment, review of chest x-ray images done today, symptom management, review of all medications, review of most recent bloodwork results, review of health maintenance items, education on nutrition, prognosis, documentation, and need for follow up.   Problem List Items Addressed This Visit       Respiratory   Lower respiratory infection - Primary   Clinically stable.  No red flag signs or symptoms No pneumonia on chest x-ray Viral respiratory infection now with secondary bacterial infection Recommend daily azithromycin for 5 days. Symptom management discussed Advised to rest and stay well-hydrated Advised to contact the office if no better or worse during the next several days      Relevant Medications   azithromycin (ZITHROMAX) 250 MG tablet   Other Relevant Orders   DG Chest 2 View (Completed)     Other   Persistent cough   Cough management discussed. Recommend to take over-the-counter Mucinex DM and cough drops Tessalon 200 mg 3 times a day Advised to rest and stay well-hydrated      Relevant Medications   benzonatate (TESSALON) 200 MG capsule   Other Relevant Orders   DG Chest 2 View (Completed)   Patient Instructions  Tos en los adultos Cough, Adult La tos ayuda a despejar la garganta y los pulmones. Puede ser un signo de una enfermedad u otra afeccin. Una tos de corto plazo (aguda) puede durar de 2 a 3 semanas. Una tos prolongada (crnica) puede durar 8  semanas o ms tiempo. Hay muchas cosas que pueden causar tos. Entre ellas, se incluyen las siguientes: Enfermedades como: Una infeccin en la garganta o los pulmones. Asma u otros problemas cardacos o pulmonares. Reflujo gastroesofgico. Esto ocurre cuando el cido asciende desde el [place]. Inhalacin de cosas que alteran (irritan) los pulmones. Alergias. Goteo posnasal. Se produce cuando la mucosidad baja por la parte posterior de la garganta. Fumar. Algunos medicamentos. Siga estas indicaciones en su casa: Medicamentos Use los medicamentos de venta libre y los recetados solamente como se lo haya indicado el mdico. Hable con el mdico antes de tomar medicamentos para la tos (antitusivos). Comida y bebida No beba alcohol. No beber cafena. Beba suficiente lquido para Radio producer pis (la orina) de color amarillo plido. Estilo de vida Mantngase alejado del humo de cigarrillo. No fume ni consuma ningn producto que contenga nicotina o tabaco. Si necesita ayuda para dejar de consumir estos productos, consulte al mdico. Mantngase alejado de las cosas que lo hacen toser. Estas pueden incluir perfumes, velas, productos de limpieza o humo de fogatas. Indicaciones generales  Fjese si hay algn cambio en la tos. Dgale a su mdico sobre ellos. Siempre cbrase la boca al toser. Si el aire es muy seco en su casa, use un humidificador o un vaporizador de niebla fra. Si la tos empeora por la noche, pruebe con usar almohadas adicionales para elevar la cabeza mientras duerme. Descanse todo lo que sea necesario. Comunquese con un mdico si: Aparecen nuevos sntomas. Sus sntomas empeoran. Tose y escupe pus. Tiene fiebre que no desaparece. La tos no mejora despus de 2 a 3 semanas. Los medicamentos para la tos no lo [place] y usted no duerme bien. Siente que el dolor empeora o no se alivia con los  medicamentos. Est adelgazando y no sabe por qu. Transpira durante la noche. Solicite  ayuda de inmediato si: Tose y [employer]. Tiene dificultad para respirar. Su corazn late muy rpidamente. Estos sntomas pueden Customer service manager. Solicite ayuda de inmediato. Llame al 911. No espere a ver si los sntomas desaparecen. No conduzca por sus propios medios [employer]. Esta informacin no tiene Theme park manager el consejo del mdico. Asegrese de hacerle al mdico cualquier pregunta que tenga. Document Revised: 09/20/2021 Document Reviewed: 09/20/2021 Elsevier Patient Education  2024 Elsevier Inc.    Edwina Barth, MD [facility]

## 2023-03-21 NOTE — Patient Instructions (Signed)
 Tos en los adultos Cough, Adult La tos ayuda a despejar la garganta y los pulmones. Puede ser un signo de una enfermedad u otra afeccin. Una tos de corto plazo (aguda) puede durar de 2 a 3 semanas. Una tos prolongada (crnica) puede durar 8 semanas o ms tiempo. Hay muchas cosas que pueden causar tos. Entre ellas, se incluyen las siguientes: Enfermedades como: Una infeccin en la garganta o los pulmones. Asma u otros problemas cardacos o pulmonares. Reflujo gastroesofgico. Esto ocurre cuando el cido asciende desde el [place]. Inhalacin de cosas que alteran (irritan) los pulmones. Alergias. Goteo posnasal. Se produce cuando la mucosidad baja por la parte posterior de la garganta. Fumar. Algunos medicamentos. Siga estas indicaciones en su casa: Medicamentos Use los medicamentos de venta libre y los recetados solamente como se lo haya indicado el mdico. Hable con el mdico antes de tomar medicamentos para la tos (antitusivos). Comida y bebida No beba alcohol. No beber cafena. Beba suficiente lquido para Radio producer pis (la orina) de color amarillo plido. Estilo de vida Mantngase alejado del humo de cigarrillo. No fume ni consuma ningn producto que contenga nicotina o tabaco. Si necesita ayuda para dejar de consumir estos productos, consulte al mdico. Mantngase alejado de las cosas que lo hacen toser. Estas pueden incluir perfumes, velas, productos de limpieza o humo de fogatas. Indicaciones generales  Fjese si hay algn cambio en la tos. Dgale a su mdico sobre ellos. Siempre cbrase la boca al toser. Si el aire es muy seco en su casa, use un humidificador o un vaporizador de niebla fra. Si la tos empeora por la noche, pruebe con usar almohadas adicionales para elevar la cabeza mientras duerme. Descanse todo lo que sea necesario. Comunquese con un mdico si: Aparecen nuevos sntomas. Sus sntomas empeoran. Tose y escupe pus. Tiene fiebre que no desaparece. La tos  no mejora despus de 2 a 3 semanas. Los medicamentos para la tos no lo [place] y usted no duerme bien. Siente que el dolor empeora o no se alivia con los medicamentos. Est adelgazando y no sabe por qu. Transpira durante la noche. Solicite ayuda de inmediato si: Tose y [employer]. Tiene dificultad para respirar. Su corazn late muy rpidamente. Estos sntomas pueden Customer service manager. Solicite ayuda de inmediato. Llame al 911. No espere a ver si los sntomas desaparecen. No conduzca por sus propios medios [employer]. Esta informacin no tiene Theme park manager el consejo del mdico. Asegrese de hacerle al mdico cualquier pregunta que tenga. Document Revised: 09/20/2021 Document Reviewed: 09/20/2021 Elsevier Patient Education  2024 [employer].

## 2023-03-21 NOTE — Assessment & Plan Note (Signed)
 Cough management discussed. Recommend to take over-the-counter Mucinex DM and cough drops Tessalon 200 mg 3 times a day Advised to rest and stay well-hydrated

## 2023-04-13 DIAGNOSIS — Z419 Encounter for procedure for purposes other than remedying health state, unspecified: Secondary | ICD-10-CM | POA: Diagnosis not present

## 2023-05-09 ENCOUNTER — Emergency Department (HOSPITAL_BASED_OUTPATIENT_CLINIC_OR_DEPARTMENT_OTHER)
Admission: EM | Admit: 2023-05-09 | Discharge: 2023-05-09 | Disposition: A | Discharge status: Home / Self Care | Attending: Emergency Medicine | Admitting: Emergency Medicine

## 2023-05-09 ENCOUNTER — Encounter (HOSPITAL_BASED_OUTPATIENT_CLINIC_OR_DEPARTMENT_OTHER): Payer: Self-pay

## 2023-05-09 ENCOUNTER — Other Ambulatory Visit: Payer: Self-pay

## 2023-05-09 DIAGNOSIS — S0591XA Unspecified injury of right eye and orbit, initial encounter: Secondary | ICD-10-CM | POA: Diagnosis present

## 2023-05-09 DIAGNOSIS — N189 Chronic kidney disease, unspecified: Secondary | ICD-10-CM | POA: Diagnosis not present

## 2023-05-09 DIAGNOSIS — I129 Hypertensive chronic kidney disease with stage 1 through stage 4 chronic kidney disease, or unspecified chronic kidney disease: Secondary | ICD-10-CM | POA: Insufficient documentation

## 2023-05-09 DIAGNOSIS — S0501XA Injury of conjunctiva and corneal abrasion without foreign body, right eye, initial encounter: Secondary | ICD-10-CM | POA: Diagnosis not present

## 2023-05-09 DIAGNOSIS — W448XXA Other foreign body entering into or through a natural orifice, initial encounter: Secondary | ICD-10-CM | POA: Insufficient documentation

## 2023-05-09 DIAGNOSIS — Z79899 Other long term (current) drug therapy: Secondary | ICD-10-CM | POA: Insufficient documentation

## 2023-05-09 MED ORDER — ERYTHROMYCIN 5 MG/GM OP OINT
TOPICAL_OINTMENT | Freq: Once | OPHTHALMIC | Status: AC
  Filled 2023-05-09: qty 3.5

## 2023-05-09 MED ORDER — TETRACAINE HCL 0.5 % OP SOLN
2.0000 [drp] | Freq: Once | OPHTHALMIC | Status: AC
  Administered 2023-05-09: 2 [drp] via OPHTHALMIC
  Filled 2023-05-09: qty 4

## 2023-05-09 MED ORDER — FLUORESCEIN SODIUM 1 MG OP STRP
1.0000 | ORAL_STRIP | Freq: Once | OPHTHALMIC | Status: AC
  Administered 2023-05-09: 1 via OPHTHALMIC
  Filled 2023-05-09: qty 1

## 2023-05-09 NOTE — Discharge Instructions (Addendum)
 Le he recetado medicina para ayudar con [employer] de su ojo, aplique esta medicina todos los dias cuatro veces al dia.   Haga una cita con el ophtamologo si su dolor continua.

## 2023-05-09 NOTE — ED Provider Notes (Signed)
 [place] EMERGENCY DEPARTMENT AT [facility] Provider Note   CSN: [id number] Arrival date & time: 05/09/23  1512     History CKD Chief Complaint  Patient presents with   Eye Problem    Ethan Silva is a 58 y.o. male.  58 y.o male with a PMH of HTN, CKD presents to the ED with a chief complaint of a lateral eye pain which began approximately 4 hours ago.  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  Has applied some eyedrops but there was no improvement in pain. Denies any loss of vision, blurry vision, or headache.   The history is provided by the patient.  Eye Problem Location:  Both eyes Quality:  Dull Severity:  Moderate Onset quality:  Sudden Duration:  4 hours Timing:  Constant Chronicity:  New Relieved by:  Nothing Worsened by:  Nothing Associated symptoms: discharge and redness   Associated symptoms: no double vision and no nausea        Home Medications Prior to Admission medications   Medication Sig Start Date End Date Taking? Authorizing Provider  acetaminophen  (TYLENOL ) 325 MG tablet Take 325 mg by mouth every 6 (six) hours as needed for moderate pain.    [provider]  amLODipine  (NORVASC ) 10 MG tablet Take 1 tablet (10 mg total) by mouth daily. 03/19/23 06/17/23  Arleen Lacer, MD  amoxicillin  (AMOXIL ) 500 MG capsule Take 2 capsules (1,000 mg total) by mouth 2 (two) times daily. Patient not taking: Reported on 03/21/2023 12/22/22   Alissa April, MD  aspirin  81 MG EC tablet Take 1 tablet (81 mg total) by mouth daily. Swallow whole. Patient not taking: Reported on 03/21/2023 07/15/20   Harrold Lincoln, MD  azithromycin  (ZITHROMAX ) 250 MG tablet Sig as indicated 03/21/23   Elvira Hammersmith, MD  benzonatate  (TESSALON ) 200 MG capsule Take 1 capsule (200 mg total) by mouth 2  (two) times daily as needed for cough. 03/21/23   Elvira Hammersmith, MD  chlorthalidone  (HYGROTON ) 25 MG tablet Take 1 tablet (25 mg total) by mouth daily. 03/19/23 06/17/23  Arleen Lacer, MD  gabapentin  (NEURONTIN ) 300 MG capsule Take 1 capsule (300 mg total) by mouth 3 (three) times daily. 04/25/22 05/25/22  Diedra Fowler, MD  lisinopril  (ZESTRIL ) 40 MG tablet Take 1 tablet (40 mg total) by mouth daily. 03/19/23 06/17/23  Arleen Lacer, MD  nitroGLYCERIN  (NITROSTAT ) 0.4 MG SL tablet Place 1 tablet (0.4 mg total) under the tongue every 5 (five) minutes x 3 doses as needed for chest pain. Patient not taking: Reported on 03/21/2023 09/10/22   Arleen Lacer, MD  pantoprazole  (PROTONIX ) 40 MG tablet Take 1 tablet by mouth once daily Patient not taking: Reported on 03/21/2023 05/14/22   Arleen Lacer, MD  polyethylene glycol powder (GLYCOLAX /MIRALAX ) 17 GM/SCOOP powder Take 17 g by mouth 2 (two) times daily as needed. Patient not taking: Reported on 03/21/2023 10/11/20   Roslyn Coombe, MD  rosuvastatin  (CRESTOR ) 40 MG tablet Take 1 tablet (40 mg total) by mouth daily. 03/19/23 03/13/24  Arleen Lacer, MD  spironolactone  (ALDACTONE ) 25 MG tablet Take 1 tablet (25 mg total) by mouth daily. 03/19/23 06/17/23  Arleen Lacer, MD  traMADol  (ULTRAM ) 50 MG tablet Take 1 tablet (50 mg total) by mouth every 6 (six) hours  as needed. Patient not taking: Reported on 03/21/2023 12/22/22   Alissa April, MD      Allergies    Patient has no known allergies.    Review of Systems   Review of Systems  Constitutional:  Negative for fever.  Eyes:  Positive for pain, discharge and redness. Negative for double vision.  Respiratory:  Negative for shortness of breath.   Cardiovascular:  Negative for chest pain.  Gastrointestinal:  Negative for nausea.    Physical Exam Updated Vital Signs BP (!) 167/99 (BP Location: Right Arm)   Pulse 86   Temp 98.7 F (37.1 C) (Oral)   Resp 20   Ht 5\' 3"  (1.6 m)   Wt  74.5 kg   SpO2 100%   BMI 29.09 kg/m  Physical Exam Vitals and nursing note reviewed.  Constitutional:      Appearance: Normal appearance.  HENT:     Head: Normocephalic and atraumatic.     Mouth/Throat:     Mouth: Mucous membranes are moist.  Eyes:     General:        Right eye: Discharge present.     Intraocular pressure: Right eye pressure is 22 mmHg. Measurements were taken using a handheld tonometer.    Extraocular Movements: Extraocular movements intact.     Right eye: Normal extraocular motion.     Left eye: Normal extraocular motion.     Conjunctiva/sclera:     Right eye: Right conjunctiva is injected.     Left eye: Left conjunctiva is injected.     Pupils: Pupils are equal, round, and reactive to light.  Cardiovascular:     Rate and Rhythm: Normal rate.  Pulmonary:     Effort: Pulmonary effort is normal.  Abdominal:     General: Abdomen is flat.  Musculoskeletal:     Cervical back: Normal range of motion and neck supple.  Skin:    General: Skin is warm and dry.  Neurological:     Mental Status: He is alert and oriented to person, place, and time.     ED Results / Procedures / Treatments   Labs (all labs ordered are listed, but only abnormal results are displayed) Labs Reviewed - No data to display  EKG None  Radiology No results found.  Procedures Procedures    Medications Ordered in ED Medications  fluorescein  ophthalmic strip 1 strip (1 strip Both Eyes Given 05/09/23 1550)  tetracaine  (PONTOCAINE) 0.5 % ophthalmic solution 2 drop (2 drops Both Eyes Given 05/09/23 1550)  erythromycin  ophthalmic ointment ( Right Eye Given 05/09/23 1639)    ED Course/ Medical Decision Making/ A&P                                 Medical Decision Making Risk Prescription drug management.    Patient here s/p right eye pain from falling paint onto his right eye, reports pain to the right eye, reports there is redness, discharge,, has tried to wash it along with  apply drops without any improvement in his symptoms.  On evaluation here today, small corneal abrasion was noted.    Normal pressure, normal extraocular movements, no suspicion for open globe rupture, no ulceration noted.  Do suspect corneal abrasion, given erythromycin  while in the ED, will refer him to ophthalmology for further evaluation of his eye.  He is agreeable to plan and treatment, return precautions discussed at length.  Patient stable for discharge.  Portions of this note were generated with Scientist, clinical (histocompatibility and immunogenetics). Dictation errors may occur despite best attempts at proofreading.   Final Clinical Impression(s) / ED Diagnoses Final diagnoses:  Abrasion of right cornea, initial encounter    Rx / DC Orders ED Discharge Orders     None         Itzia Cunliffe, PA-C 05/09/23 1645    Lind Repine, MD 05/10/23 2241

## 2023-05-09 NOTE — ED Triage Notes (Addendum)
 Patient arrives with complaints of right eye pain. Patient states that he was sanding on a roof when something went into his right eye earlier today.   Reports discomfort (4/10 pain).

## 2023-05-13 DIAGNOSIS — Z419 Encounter for procedure for purposes other than remedying health state, unspecified: Secondary | ICD-10-CM | POA: Diagnosis not present

## 2023-05-21 ENCOUNTER — Ambulatory Visit: Attending: Pharmacist | Admitting: Pharmacist

## 2023-05-21 NOTE — Progress Notes (Deleted)
 Patient ID: Ethan Silva                 DOB: December 06, 1965                      MRN: [id number]     HPI: Ethan Silva is a 58 y.o. male referred by Dr. Addie Holstein to HTN clinic. PMH is significant for HTN, CAD, aortic atherosclerosis, HLD, and preDM. Spironolactone  increased at last visit.  Patient presents today for HTN f/u. Translating service used. Ethan Silva #[id number]   Reports no adverse effects to spironolactone  increase and believes it is helping his BP. Reports home systolic readings between 120-135 but did not bring cuff or log.  Noticed his blood pressure was higher when he had a back injury and more recently when he had a cough. Only uses Tylenol  for pain.  Reports dizziness when standing occasionally.  Current HTN meds:  Amlodipine  10mg  daily Chlorthalidone  25mg  daily Lisinopril  40mg  daily Spironolactone  25mg  daily   BP goal: <130/80  Wt Readings from Last 3 Encounters:  11/26/22 168 lb 9.6 oz (76.5 kg)  11/13/22 166 lb 6 oz (75.5 kg)  10/15/22 167 lb 12.8 oz (76.1 kg)   BP Readings from Last 3 Encounters:  12/22/22 136/88  12/19/22 (!) 136/90  11/26/22 (!) 144/84   Pulse Readings from Last 3 Encounters:  12/22/22 63  12/19/22 62  11/26/22 69    Renal function: CrCl cannot be calculated (Patient's most recent lab result is older than the maximum 21 days allowed.).  Past Medical History:  Diagnosis Date   Aortic stenosis, mild 07/2012   Very mild aortic stenosis noted on echo-mean gradient 11 mmHg.   Bile duct leak    CAD in native artery 07/14/2020   Small branch of OM 3 with 80% ostial stenosis.  Too small for PCI.  Otherwise questionable 30% LM and 30% PDA.   Chronic kidney disease 2017   left kidney stones   Headache(784.0)    Hx: of when BP is elevated   Hyperlipidemia    Hypertension    Non-STEMI (non-ST elevated myocardial infarction) (HCC) 07/13/2020   Admitted with hypertension and chest pain.  Cardiac cath showed small  branch of an OM3 with 85% ostial stenosis not amenable to PCI.  No other significant disease noted.  Normal EF on echo.  Medical management.   Numbness and tingling in hands    Hx: of   Prediabetes     Current Outpatient Medications on File Prior to Visit  Medication Sig Dispense Refill   acetaminophen  (TYLENOL ) 325 MG tablet Take 325 mg by mouth every 6 (six) hours as needed for moderate pain.     amLODipine  (NORVASC ) 10 MG tablet Take 1 tablet (10 mg total) by mouth daily. 90 tablet 3   amoxicillin  (AMOXIL ) 500 MG capsule Take 2 capsules (1,000 mg total) by mouth 2 (two) times daily. 40 capsule 0   aspirin  81 MG EC tablet Take 1 tablet (81 mg total) by mouth daily. Swallow whole. 90 tablet 3   chlorthalidone  (HYGROTON ) 25 MG tablet Take 1 tablet (25 mg total) by mouth daily. 90 tablet 3   gabapentin  (NEURONTIN ) 300 MG capsule Take 1 capsule (300 mg total) by mouth 3 (three) times daily. 90 capsule 0   lisinopril  (ZESTRIL ) 40 MG tablet Take 1 tablet (40 mg total) by mouth daily. 90 tablet 3   nitroGLYCERIN  (NITROSTAT ) 0.4 MG SL tablet Place 1 tablet (0.4 mg total) under  the tongue every 5 (five) minutes x 3 doses as needed for chest pain. 25 tablet 6   pantoprazole  (PROTONIX ) 40 MG tablet Take 1 tablet by mouth once daily 90 tablet 3   polyethylene glycol powder (GLYCOLAX /MIRALAX ) 17 GM/SCOOP powder Take 17 g by mouth 2 (two) times daily as needed. 3350 g 1   rosuvastatin  (CRESTOR ) 40 MG tablet Take 1 tablet (40 mg total) by mouth daily. 90 tablet 3   spironolactone  (ALDACTONE ) 25 MG tablet Take 1 tablet (25 mg total) by mouth daily. 90 tablet 3   traMADol  (ULTRAM ) 50 MG tablet Take 1 tablet (50 mg total) by mouth every 6 (six) hours as needed. 15 tablet 0   No current facility-administered medications on file prior to visit.    No Known Allergies   Assessment/Plan:  1. Hypertension -   Patient BP improved today at 132/82. Slightly above goal. Home readings sound more controlled . No  medication changes needed today.  Advised that he may have some orthostatic hypotension. Advised to stand up slowly and make sure to stay hydrated.  Continue: Amlodipine  10mg  daily Chlorthalidone  25mg  daily Lisinopril  40mg  daily Spironolactone  25mg  daily F/u in 2 months  Joelene Murrain, PharmD, BCACP, CDCES, CPP [address] [place], [place], [address] Phone: [phone], Fax: [phone]

## 2023-06-13 DIAGNOSIS — Z419 Encounter for procedure for purposes other than remedying health state, unspecified: Secondary | ICD-10-CM | POA: Diagnosis not present

## 2023-06-20 ENCOUNTER — Ambulatory Visit: Admitting: Emergency Medicine

## 2023-06-20 ENCOUNTER — Encounter: Payer: Self-pay | Admitting: Emergency Medicine

## 2023-06-20 ENCOUNTER — Ambulatory Visit: Payer: Self-pay | Admitting: Emergency Medicine

## 2023-06-20 VITALS — BP 150/102 | HR 69 | Temp 98.1°F | Ht 63.0 in | Wt 163.0 lb

## 2023-06-20 DIAGNOSIS — I700 Atherosclerosis of aorta: Secondary | ICD-10-CM

## 2023-06-20 DIAGNOSIS — M79604 Pain in right leg: Secondary | ICD-10-CM | POA: Diagnosis not present

## 2023-06-20 DIAGNOSIS — M79605 Pain in left leg: Secondary | ICD-10-CM

## 2023-06-20 DIAGNOSIS — I10 Essential (primary) hypertension: Secondary | ICD-10-CM | POA: Diagnosis not present

## 2023-06-20 DIAGNOSIS — R7303 Prediabetes: Secondary | ICD-10-CM

## 2023-06-20 DIAGNOSIS — Z125 Encounter for screening for malignant neoplasm of prostate: Secondary | ICD-10-CM

## 2023-06-20 DIAGNOSIS — Z1211 Encounter for screening for malignant neoplasm of colon: Secondary | ICD-10-CM

## 2023-06-20 DIAGNOSIS — I2510 Atherosclerotic heart disease of native coronary artery without angina pectoris: Secondary | ICD-10-CM

## 2023-06-20 DIAGNOSIS — E785 Hyperlipidemia, unspecified: Secondary | ICD-10-CM

## 2023-06-20 DIAGNOSIS — G8929 Other chronic pain: Secondary | ICD-10-CM

## 2023-06-20 LAB — URINALYSIS, ROUTINE W REFLEX MICROSCOPIC
Bilirubin Urine: NEGATIVE
Ketones, ur: NEGATIVE
Leukocytes,Ua: NEGATIVE
Nitrite: NEGATIVE
Specific Gravity, Urine: >=1.03 — AB (ref 1.000–1.030)
Total Protein, Urine: 30 — AB
Urine Glucose: NEGATIVE
Urobilinogen, UA: 0.2 (ref 0.0–1.0)
pH: 6 (ref 5.0–8.0)

## 2023-06-20 LAB — CBC WITH DIFFERENTIAL/PLATELET
Basophils Absolute: 0 10*3/uL (ref 0.0–0.1)
Basophils Relative: 0.6 % (ref 0.0–3.0)
Eosinophils Absolute: 0.1 10*3/uL (ref 0.0–0.7)
Eosinophils Relative: 1.7 % (ref 0.0–5.0)
HCT: 49.3 % (ref 39.0–52.0)
Hemoglobin: 16.8 g/dL (ref 13.0–17.0)
Lymphocytes Relative: 24.1 % (ref 12.0–46.0)
Lymphs Abs: 1.8 10*3/uL (ref 0.7–4.0)
MCHC: 34.2 g/dL (ref 30.0–36.0)
MCV: 87.4 fl (ref 78.0–100.0)
Monocytes Absolute: 0.5 10*3/uL (ref 0.1–1.0)
Monocytes Relative: 7 % (ref 3.0–12.0)
Neutro Abs: 5 10*3/uL (ref 1.4–7.7)
Neutrophils Relative %: 66.6 % (ref 43.0–77.0)
Platelets: 224 10*3/uL (ref 150.0–400.0)
RBC: 5.64 Mil/uL (ref 4.22–5.81)
RDW: 14.1 % (ref 11.5–15.5)
WBC: 7.4 10*3/uL (ref 4.0–10.5)

## 2023-06-20 LAB — COMPREHENSIVE METABOLIC PANEL WITH GFR
ALT: 36 U/L (ref 0–53)
AST: 29 U/L (ref 0–37)
Albumin: 4.8 g/dL (ref 3.5–5.2)
Alkaline Phosphatase: 43 U/L (ref 39–117)
BUN: 16 mg/dL (ref 6–23)
CO2: 29 meq/L (ref 19–32)
Calcium: 9.5 mg/dL (ref 8.4–10.5)
Chloride: 104 meq/L (ref 96–112)
Creatinine, Ser: 0.98 mg/dL (ref 0.40–1.50)
GFR: 83.96 mL/min (ref >60.00)
Glucose, Bld: 106 mg/dL — ABNORMAL HIGH (ref 70–99)
Potassium: 3.8 meq/L (ref 3.5–5.1)
Sodium: 142 meq/L (ref 135–145)
Total Bilirubin: 0.7 mg/dL (ref 0.2–1.2)
Total Protein: 7.6 g/dL (ref 6.0–8.3)

## 2023-06-20 LAB — LIPID PANEL
Cholesterol: 268 mg/dL — ABNORMAL HIGH (ref 0–200)
HDL: 40.1 mg/dL (ref >39.00)
LDL Cholesterol: 190 mg/dL — ABNORMAL HIGH (ref 0–99)
NonHDL: 227.42
Total CHOL/HDL Ratio: 7
Triglycerides: 186 mg/dL — ABNORMAL HIGH (ref 0.0–149.0)
VLDL: 37.2 mg/dL (ref 0.0–40.0)

## 2023-06-20 LAB — PSA: PSA: 1.41 ng/mL (ref 0.10–4.00)

## 2023-06-20 LAB — HEMOGLOBIN A1C: Hgb A1c MFr Bld: 6.1 % (ref 4.6–6.5)

## 2023-06-20 MED ORDER — NITROGLYCERIN 0.4 MG SL SUBL: 0.4000 mg | SUBLINGUAL_TABLET | SUBLINGUAL | 6 refills | Status: AC | PRN

## 2023-06-20 NOTE — Assessment & Plan Note (Signed)
 Chronic problem History of osteoarthritis multiple joints Pain management discussed Related to activities of daily life

## 2023-06-20 NOTE — Assessment & Plan Note (Signed)
 As per last cardiology note: Total cholesterol 222, LDL 133, despite Crestor  therapy.  Patient ran out of Crestor  3 days ago which was after labs are drawn. -Refill Crestor  prescription. -Follow-up with lipid clinic on December 19, 2022, to discuss lipid management.  - Anticipate PCSK9 Inhibitor or potentially Inclisarin (for ensured compliance

## 2023-06-20 NOTE — Patient Instructions (Signed)
 Mantenimiento de Research officer, political party, Male Adoptar un estilo de vida saludable y recibir atencin preventiva son importantes para promover la salud y Counsellor. Consulte al mdico sobre: El esquema adecuado para hacerse pruebas y exmenes peridicos. Cosas que puede hacer por su cuenta para prevenir enfermedades y [place] sano. Qu debo saber sobre la dieta, el peso y el ejercicio? Consuma una dieta saludable  Consuma una dieta que incluya muchas verduras, frutas, productos lcteos con bajo contenido de [place] y Associate Professor. No consuma muchos alimentos ricos en grasas slidas, azcares agregados o sodio. Mantenga un peso saludable El ndice de masa muscular [facility]) es una medida que puede utilizarse para identificar posibles problemas de [place]. Proporciona una estimacin de la grasa corporal basndose en el peso y la altura. Su mdico puede ayudarle a Engineer, site IMC y a Personnel officer o Pharmacologist un peso saludable. Haga ejercicio con regularidad Haga ejercicio con regularidad. Esta es una de las prcticas ms importantes que puede hacer por su salud. La [employer] de los adultos deben seguir estas pautas: Education officer, environmental, al menos, 150 minutos de actividad fsica por semana. El ejercicio debe aumentar la frecuencia cardaca y Media planner transpirar (ejercicio de intensidad moderada). Hacer ejercicios de fortalecimiento por lo [employer] por semana. Agregue esto a su plan de ejercicio de intensidad moderada. Pase menos tiempo sentado. Incluso la actividad fsica ligera puede ser beneficiosa. Controle sus niveles de colesterol y lpidos en la sangre Comience a realizarse anlisis de lpidos y Oncologist en la sangre a los 20 aos y luego reptalos cada 5 aos. Es posible que Insurance underwriter los niveles de colesterol con mayor frecuencia si: Sus niveles de lpidos y colesterol son altos. Es mayor de 40 aos. Presenta un alto riesgo de padecer enfermedades cardacas. Qu debo  saber sobre las pruebas de deteccin del cncer? Muchos tipos de cncer pueden detectarse de manera temprana y, a menudo, pueden prevenirse. Segn su historia clnica y sus antecedentes familiares, es posible que deba realizarse pruebas de deteccin del cncer en diferentes edades. Esto puede incluir pruebas de deteccin de lo siguiente: Building services engineer. Cncer de prstata. Cncer de piel. Cncer de pulmn. Qu debo saber sobre la enfermedad cardaca, la diabetes y la hipertensin arterial? Presin arterial y enfermedad cardaca La hipertensin arterial causa enfermedades cardacas y [place] el riesgo de accidente cerebrovascular. Es ms probable que esto se manifieste en las personas que tienen lecturas de presin arterial alta o tienen sobrepeso. Hable con el mdico sobre sus valores de presin arterial deseados. Hgase controlar la presin arterial: Cada 3 a 5 aos si tiene entre 18 y 71 aos. Todos los aos si es mayor de 40 aos. Si tiene entre 65 y 26 aos y es fumador o Insurance underwriter, pregntele al mdico si debe realizarse una prueba de deteccin de aneurisma artico abdominal (AAA) por nica vez. Diabetes Realcese exmenes de deteccin de la diabetes con regularidad. Este anlisis revisa el nivel de azcar en la sangre en [place]. Hgase las pruebas de deteccin: Cada tres aos despus de los 45 aos de edad si tiene un peso normal y un bajo riesgo de padecer diabetes. Con ms frecuencia y a partir de [place] edad inferior si tiene sobrepeso o un alto riesgo de padecer diabetes. Qu debo saber sobre la prevencin de infecciones? Hepatitis B Si tiene un riesgo ms alto de contraer hepatitis B, debe someterse a un examen de deteccin de este virus. Hable con el mdico para averiguar si tiene riesgo de  contraer la infeccin por hepatitis B. Hepatitis C Se recomienda un anlisis de Benjamin Perez para: Todos los que nacieron entre 1945 y [phone]. Todas las personas que tengan un riesgo de haber  contrado hepatitis C. Enfermedades de transmisin sexual (ETS) Debe realizarse pruebas de deteccin de ITS todos los aos, incluidas la gonorrea y la clamidia, si: Es sexualmente activo y es menor de [address]. Es mayor de [address], y Public affairs consultant informa que corre riesgo de tener este tipo de infecciones. La actividad sexual ha cambiado desde que le hicieron la ltima prueba de deteccin y tiene un riesgo mayor de Warehouse manager clamidia o Copy. Pregntele al mdico si usted tiene riesgo. Pregntele al mdico si usted tiene un alto riesgo de Primary school teacher VIH. El mdico tambin puede recomendarle un medicamento recetado para ayudar a evitar la infeccin por el VIH. Si elige tomar medicamentos para prevenir el VIH, primero debe [employer] de deteccin del VIH. Luego debe hacerse anlisis cada 3 meses mientras est tomando los medicamentos. Siga estas indicaciones en su casa: Consumo de alcohol No beba alcohol si el mdico se lo prohbe. Si bebe alcohol: Limite la cantidad que consume de 0 a 2 bebidas por da. Sepa cunta cantidad de alcohol hay en las bebidas que toma. En los [address], una medida equivale a una botella de cerveza de 12 oz (355 ml), un vaso de vino de 5 oz (148 ml) o un vaso de una bebida alcohlica de alta graduacin de 1 oz (44 ml). Estilo de vida No consuma ningn producto que contenga nicotina o tabaco. Estos productos incluyen cigarrillos, tabaco para Theatre manager y aparatos de vapeo, como los Administrator, Civil Service. Si necesita ayuda para dejar de consumir estos productos, consulte al mdico. No consuma drogas. No comparta agujas. Solicite ayuda a su mdico si necesita apoyo o informacin para abandonar las drogas. Indicaciones generales Realcese los estudios de rutina de [address], dentales y de Wellsite geologist. Mantngase al da con las vacunas. Infrmele a su mdico si: Se siente deprimido con frecuencia. Alguna vez ha sido vctima de [place] o no se siente seguro en su  casa. Resumen Adoptar un estilo de vida saludable y recibir atencin preventiva son importantes para promover la salud y Counsellor. Siga las instrucciones del mdico acerca de una dieta saludable, el ejercicio y la realizacin de pruebas o exmenes para Hotel manager. Siga las instrucciones del mdico con respecto al control del colesterol y la presin arterial. Esta informacin no tiene Theme park manager el consejo del mdico. Asegrese de hacerle al mdico cualquier pregunta que tenga. Document Revised: 05/25/2020 Document Reviewed: 05/25/2020 Elsevier Patient Education  2024 [employer].

## 2023-06-20 NOTE — Assessment & Plan Note (Addendum)
 BP Readings from Last 3 Encounters:  06/20/23 (!) 150/102  05/09/23 130/83  03/21/23 (!) 138/92  Elevated blood pressure reading in the office today. Did not take blood pressure medications today Continue lisinopril  40 mg and amlodipine  10 mg daily Cardiovascular risks associated with uncontrolled hypertension discussed Benefits of exercise discussed Diet and nutrition discussed Follow-up in 6 months Work done today

## 2023-06-20 NOTE — Assessment & Plan Note (Signed)
 As per last cardiology assessment: Small OM branch that was significantly stenosed was the likely cause for demand ischemia and elevation of troponins in response to hypertensive urgency.  Not PCI target. Thankfully, no further anginal symptoms.   Plan: Continue aggressive risk factor modification  Daily aspirin  81 mg On amlodipine , lisinopril , spironolactone  and chlorthalidone .  Not on beta-blocker because of concerns for fatigue and bradycardia in the hospital. BP followed by [facility] Could consider carvedilol or nebivolol if additional blood pressure control is needed.   On rosuvastatin  40 mg daily but labs indicate the lipids are not adequately controlled.   Due to see [facility] lipid clinic.

## 2023-06-20 NOTE — Assessment & Plan Note (Signed)
 Diet and nutrition discussed Blood work done today including hemoglobin A1c If abnormal kidney function test recommend to start Farxiga or Jardiance

## 2023-06-20 NOTE — Progress Notes (Signed)
 Ethan Silva 58 y.o.   Chief Complaint  Patient presents with   Follow-up    Patient here wanting labs done to check live, kidney and prostate. Patient also mentions having leg pain in both legs below the knee.     HISTORY OF PRESENT ILLNESS: This is a 58 y.o. male here for follow-up of chronic medical conditions including hypertension Complaining of pain below the knees on and off for months Works as a Education administrator.  Non-smoker. No other complaints or medical concerns today.  HPI   Prior to Admission medications   Medication Sig Start Date End Date Taking? Authorizing Provider  acetaminophen  (TYLENOL ) 325 MG tablet Take 325 mg by mouth every 6 (six) hours as needed for moderate pain.   Yes [provider]  amLODipine  (NORVASC ) 10 MG tablet Take 1 tablet (10 mg total) by mouth daily. 03/19/23 06/20/23 Yes Arleen Lacer, MD  lisinopril  (ZESTRIL ) 40 MG tablet Take 1 tablet (40 mg total) by mouth daily. 03/19/23 06/20/23 Yes Arleen Lacer, MD  rosuvastatin  (CRESTOR ) 40 MG tablet Take 1 tablet (40 mg total) by mouth daily. 03/19/23 03/13/24 Yes Arleen Lacer, MD  spironolactone  (ALDACTONE ) 25 MG tablet Take 1 tablet (25 mg total) by mouth daily. 03/19/23 06/20/23 Yes Arleen Lacer, MD  aspirin  81 MG EC tablet Take 1 tablet (81 mg total) by mouth daily. Swallow whole. Patient not taking: Reported on 03/21/2023 07/15/20   Harrold Lincoln, MD  benzonatate  (TESSALON ) 200 MG capsule Take 1 capsule (200 mg total) by mouth 2 (two) times daily as needed for cough. Patient not taking: Reported on 06/20/2023 03/21/23   Braidon Chermak Gianfranco, MD  chlorthalidone  (HYGROTON ) 25 MG tablet Take 1 tablet (25 mg total) by mouth daily. 03/19/23 06/17/23  Arleen Lacer, MD  gabapentin  (NEURONTIN ) 300 MG capsule Take 1 capsule (300 mg total) by mouth 3 (three) times daily. Patient not taking: Reported on 06/20/2023 04/25/22 05/25/22  Diedra Fowler, MD  nitroGLYCERIN  (NITROSTAT ) 0.4  MG SL tablet Place 1 tablet (0.4 mg total) under the tongue every 5 (five) minutes x 3 doses as needed for chest pain. 06/20/23   Elvira Hammersmith, MD  pantoprazole  (PROTONIX ) 40 MG tablet Take 1 tablet by mouth once daily Patient not taking: Reported on 06/20/2023 05/14/22   Arleen Lacer, MD    No Known Allergies  Patient Active Problem List   Diagnosis Date Noted   Persistent cough 03/21/2023   Unilateral primary osteoarthritis, left knee 11/15/2022   Chronic pain of left knee 11/13/2022   Mild aortic stenosis by prior echocardiogram 05/14/2022   Degenerative disc disease, lumbar 04/16/2022   Chronic foot pain, right 10/30/2021   Calcaneal spur of right foot 10/30/2021   Aortic atherosclerosis (HCC) 04/25/2021   Coronary artery disease involving native coronary artery of native heart without angina pectoris 07/15/2020   Prediabetes 07/15/2020   BMI 30.0-30.9,adult 08/30/2015   Hyperlipidemia with target low density lipoprotein (LDL) cholesterol less than 55 mg/dL 16/10/9602   Essential hypertension 10/11/2012    Past Medical History:  Diagnosis Date   Aortic stenosis, mild 07/2012   Very mild aortic stenosis noted on echo-mean gradient 11 mmHg.   Bile duct leak    CAD in native artery 07/14/2020   Small branch of OM 3 with 80% ostial stenosis.  Too small for PCI.  Otherwise questionable 30% LM and 30% PDA.   Chronic kidney disease 2017   left kidney stones   Headache(784.0)  Hx: of when BP is elevated   Hyperlipidemia    Hypertension    Non-STEMI (non-ST elevated myocardial infarction) (HCC) 07/13/2020   Admitted with hypertension and chest pain.  Cardiac cath showed small branch of an OM3 with 85% ostial stenosis not amenable to PCI.  No other significant disease noted.  Normal EF on echo.  Medical management.   Numbness and tingling in hands    Hx: of   Prediabetes     Past Surgical History:  Procedure Laterality Date   BILIARY DILATION  03/16/2021    Procedure: BILIARY DILATION;  Surgeon: Janel Medford, MD;  Location: [facility];  Service: Endoscopy;;   CHOLECYSTECTOMY N/A 07/11/2012   Procedure: LAPAROSCOPIC CHOLECYSTECTOMY WITH INTRAOPERATIVE CHOLANGIOGRAM;  Surgeon: Shela Derby, MD;  Location: [facility] OR;  Service: General;  Laterality: N/A;   COLONOSCOPY     ENDOSCOPIC RETROGRADE CHOLANGIOPANCREATOGRAPHY (ERCP) WITH PROPOFOL  N/A 03/16/2021   Procedure: ENDOSCOPIC RETROGRADE CHOLANGIOPANCREATOGRAPHY (ERCP) WITH PROPOFOL ;  Surgeon: Janel Medford, MD;  Location: [facility];  Service: Endoscopy;  Laterality: N/A;   ERCP  07/16/2012   ERCP N/A 07/16/2012   Procedure: ENDOSCOPIC RETROGRADE CHOLANGIOPANCREATOGRAPHY (ERCP);  Surgeon: Claudette Cue, MD;  Location: [facility] OR;  Service: Gastroenterology;  Laterality: N/A;   ERCP N/A 10/06/2012   Procedure: ENDOSCOPIC RETROGRADE CHOLANGIOPANCREATOGRAPHY (ERCP);  Surgeon: Claudette Cue, MD;  Location: [facility];  Service: Endoscopy;  Laterality: N/A;   ESOPHAGOGASTRODUODENOSCOPY N/A 03/16/2021   Procedure: ESOPHAGOGASTRODUODENOSCOPY (EGD);  Surgeon: Janel Medford, MD;  Location: [facility];  Service: Endoscopy;  Laterality: N/A;   EUS N/A 03/16/2021   Procedure: UPPER ENDOSCOPIC ULTRASOUND (EUS) RADIAL;  Surgeon: Janel Medford, MD;  Location: [facility];  Service: Endoscopy;  Laterality: N/A;   HAND SURGERY     LEFT HEART CATH AND CORONARY ANGIOGRAPHY N/A 07/14/2020   Procedure: LEFT HEART CATH AND CORONARY ANGIOGRAPHY;  Surgeon: Arty Binning, MD;  Location: [facility];  Service: Cardiovascular; ? Culprit Lesion ~ 80% ostial small OM3 (too small & ostial for PCI). ~30% mid LM (at a bend).  LAD with D1 & D2 - normal, RCA minimal luminal irregularities ~ 30%. PDA   SPHINCTEROTOMY  03/16/2021   Procedure: SPHINCTEROTOMY;  Surgeon: Janel Medford, MD;  Location: [facility];  Service: Endoscopy;;   TRANSTHORACIC ECHOCARDIOGRAM  07/14/2020   (NSTEMI/Accelerated  Hypertension): EF 55 to 60%.  No RWM A.  Very mild Aortic Stenosis-mean gradient 11 to mmHg.    Social History   Socioeconomic History   Marital status: Married    Spouse name: Helen Loa   Number of children: 3   Years of education: 6th grade   Highest education level: Not on file  Occupational History   Occupation: Painting    Employer: [employer], [place], [place]    Comment: Mostly indoor painting  Tobacco Use   Smoking status: Never    Passive exposure: Never   Smokeless tobacco: Never  Vaping Use   Vaping status: Never Used  Substance and Sexual Activity   Alcohol use: No   Drug use: No   Sexual activity: Yes    Partners: Female  Other Topics Concern   Not on file  Social History Narrative   Originally from [place], [place]. Came to the [place]  in [place].   Lives with his wife and their youngest son   His daughter lives in [place], [place] with her husband.  Second son is now moved out of the house as well.   He  gets a decent amount of exercise walking around at work, but does not do routine exercise.  Does not drink or smoke   Social Drivers of Corporate investment banker Strain: Not on file  Food Insecurity: Not on file  Transportation Needs: Not on file  Physical Activity: Not on file  Stress: Not on file  Social Connections: Not on file  Intimate Partner Violence: Not on file    Family History  Problem Relation Age of Onset   Gallstones Mother    Hypertension Mother    Gallstones Brother    Diabetes Paternal Uncle      Review of Systems  Constitutional: Negative.  Negative for chills and fever.  HENT: Negative.  Negative for congestion and sore throat.   Respiratory: Negative.  Negative for cough and shortness of breath.   Cardiovascular: Negative.  Negative for chest pain and palpitations.  Gastrointestinal:  Negative for abdominal pain, diarrhea, nausea and vomiting.  Genitourinary: Negative.  Negative for dysuria and hematuria.  Skin:  Negative.  Negative for rash.  Neurological: Negative.  Negative for dizziness and headaches.  All other systems reviewed and are negative.   Vitals:   06/20/23 1325  BP: (!) 150/102  Pulse: 69  Temp: 98.1 F (36.7 C)  SpO2: 96%    Physical Exam Vitals reviewed.  Constitutional:      Appearance: Normal appearance.  HENT:     Head: Normocephalic.     Right Ear: Tympanic membrane, ear canal and external ear normal.     Left Ear: Tympanic membrane, ear canal and external ear normal.     Mouth/Throat:     Mouth: Mucous membranes are moist.     Pharynx: Oropharynx is clear.   Eyes:     Extraocular Movements: Extraocular movements intact.     Pupils: Pupils are equal, round, and reactive to light.    Cardiovascular:     Rate and Rhythm: Normal rate and regular rhythm.     Pulses: Normal pulses.     Heart sounds: Normal heart sounds.  Pulmonary:     Effort: Pulmonary effort is normal.     Breath sounds: Normal breath sounds.  Abdominal:     Palpations: Abdomen is soft.     Tenderness: There is no abdominal tenderness.   Musculoskeletal:     Cervical back: No tenderness.  Lymphadenopathy:     Cervical: No cervical adenopathy.   Skin:    General: Skin is warm and dry.     Capillary Refill: Capillary refill takes less than 2 seconds.   Neurological:     General: No focal deficit present.     Mental Status: He is alert and oriented to person, place, and time.   Psychiatric:        Mood and Affect: Mood normal.        Behavior: Behavior normal.      ASSESSMENT & PLAN: A total of 44 minutes was spent with the patient and counseling/coordination of care regarding preparing for this visit, review of most recent office visit notes, review of multiple chronic medical conditions and their management, review of all medications, review of most recent bloodwork results, review of health maintenance items, education on nutrition, prognosis, documentation, and need for follow  up.   Problem List Items Addressed This Visit       Cardiovascular and Mediastinum   Essential hypertension - Primary (Chronic)   BP Readings from Last 3 Encounters:  06/20/23 (!) 150/102  05/09/23 130/83  03/21/23 (!) 138/92  Elevated blood pressure reading in the office today. Did not take blood pressure medications today Continue lisinopril  40 mg and amlodipine  10 mg daily Cardiovascular risks associated with uncontrolled hypertension discussed Benefits of exercise discussed Diet and nutrition discussed Follow-up in 6 months Work done today       Relevant Medications   nitroGLYCERIN  (NITROSTAT ) 0.4 MG SL tablet   Other Relevant Orders   Urinalysis   Comprehensive metabolic panel with GFR   CBC with Differential/Platelet   Lipid panel   Coronary artery disease involving native coronary artery of native heart without angina pectoris (Chronic)   As per last cardiology assessment: Small OM branch that was significantly stenosed was the likely cause for demand ischemia and elevation of troponins in response to hypertensive urgency.  Not PCI target. Thankfully, no further anginal symptoms.   Plan: Continue aggressive risk factor modification  Daily aspirin  81 mg On amlodipine , lisinopril , spironolactone  and chlorthalidone .  Not on beta-blocker because of concerns for fatigue and bradycardia in the hospital. BP followed by [facility] Could consider carvedilol or nebivolol if additional blood pressure control is needed.   On rosuvastatin  40 mg daily but labs indicate the lipids are not adequately controlled.   Due to see [facility] lipid clinic.      Relevant Medications   nitroGLYCERIN  (NITROSTAT ) 0.4 MG SL tablet   Aortic atherosclerosis (HCC) (Chronic)   Diet and nutrition discussed Continue rosuvastatin  40 mg daily Lipid profile done today      Relevant Medications   nitroGLYCERIN  (NITROSTAT ) 0.4 MG SL tablet   Other Relevant Orders   Lipid panel     Other   Hyperlipidemia  with target low density lipoprotein (LDL) cholesterol less than 55 mg/dL (Chronic)   As per last cardiology note: Total cholesterol 222, LDL 133, despite Crestor  therapy.  Patient ran out of Crestor  3 days ago which was after labs are drawn. -Refill Crestor  prescription. -Follow-up with lipid clinic on December 19, 2022, to discuss lipid management.  - Anticipate PCSK9 Inhibitor or potentially Inclisarin (for ensured compliance      Relevant Medications   nitroGLYCERIN  (NITROSTAT ) 0.4 MG SL tablet   Other Relevant Orders   Lipid panel   Prediabetes (Chronic)   Diet and nutrition discussed Blood work done today including hemoglobin A1c If abnormal kidney function test recommend to start Farxiga or Jardiance      Relevant Orders   Hemoglobin A1c   Lipid panel   Chronic pain of both lower extremities   Chronic problem History of osteoarthritis multiple joints Pain management discussed Related to activities of daily life      Other Visit Diagnoses       Screening for prostate cancer       Relevant Orders   PSA     Screening for colon cancer       Relevant Orders   Ambulatory referral to Gastroenterology         Patient Instructions  Mantenimiento de la salud en los hombres Health Maintenance, Male Adoptar un estilo de vida saludable y recibir atencin preventiva son importantes para promover la salud y Counsellor. Consulte al mdico sobre: El esquema adecuado para hacerse pruebas y exmenes peridicos. Cosas que puede hacer por su cuenta para prevenir enfermedades y [place] sano. Qu debo saber sobre la dieta, el peso y el ejercicio? Consuma una dieta saludable  Consuma una dieta que incluya muchas verduras, frutas, productos lcteos con bajo contenido de grasa y protenas magras. No  consuma muchos alimentos ricos en grasas slidas, azcares agregados o sodio. Mantenga un peso saludable El ndice de masa muscular [facility] es una medida que puede utilizarse para  identificar posibles problemas de [place]. Proporciona una estimacin de la grasa corporal basndose en el peso y la altura. Su mdico puede ayudarle a determinar su [facility] y a Personnel officer o Pharmacologist un peso saludable. Haga ejercicio con regularidad Haga ejercicio con regularidad. Esta es una de las prcticas ms importantes que puede hacer por su salud. La [employer] de los adultos deben seguir estas pautas: Education officer, environmental, al menos, 150 minutos de actividad fsica por semana. El ejercicio debe aumentar la frecuencia cardaca y Media planner transpirar (ejercicio de intensidad moderada). Hacer ejercicios de fortalecimiento por lo [employer] por semana. Agregue esto a su plan de ejercicio de intensidad moderada. Pase menos tiempo sentado. Incluso la actividad fsica ligera puede ser beneficiosa. Controle sus niveles de colesterol y lpidos en la sangre Comience a realizarse anlisis de lpidos y Oncologist en la sangre a los 20 aos y luego reptalos cada 5 aos. Es posible que Insurance underwriter los niveles de colesterol con mayor frecuencia si: Sus niveles de lpidos y colesterol son altos. Es mayor de 40 aos. Presenta un alto riesgo de padecer enfermedades cardacas. Qu debo saber sobre las pruebas de deteccin del cncer? Muchos tipos de cncer pueden detectarse de manera temprana y, a menudo, pueden prevenirse. Segn su historia clnica y sus antecedentes familiares, es posible que deba realizarse pruebas de deteccin del cncer en diferentes edades. Esto puede incluir pruebas de deteccin de lo siguiente: Building services engineer. Cncer de prstata. Cncer de piel. Cncer de pulmn. Qu debo saber sobre la enfermedad cardaca, la diabetes y la hipertensin arterial? Presin arterial y enfermedad cardaca La hipertensin arterial causa enfermedades cardacas y [place] el riesgo de accidente cerebrovascular. Es ms probable que esto se manifieste en las personas que tienen lecturas de presin arterial alta o  tienen sobrepeso. Hable con el mdico sobre sus valores de presin arterial deseados. Hgase controlar la presin arterial: Cada 3 a 5 aos si tiene entre 18 y 36 aos. Todos los aos si es mayor de 40 aos. Si tiene entre 65 y 67 aos y es fumador o Insurance underwriter, pregntele al mdico si debe realizarse una prueba de deteccin de aneurisma artico abdominal (AAA) por nica vez. Diabetes Realcese exmenes de deteccin de la diabetes con regularidad. Este anlisis revisa el nivel de azcar en la sangre en [place]. Hgase las pruebas de deteccin: Cada tres aos despus de los 45 aos de edad si tiene un peso normal y un bajo riesgo de padecer diabetes. Con ms frecuencia y a partir de [place] edad inferior si tiene sobrepeso o un alto riesgo de padecer diabetes. Qu debo saber sobre la prevencin de infecciones? Hepatitis B Si tiene un riesgo ms alto de contraer hepatitis B, debe someterse a un examen de deteccin de este virus. Hable con el mdico para averiguar si tiene riesgo de contraer la infeccin por hepatitis B. Hepatitis C Se recomienda un anlisis de [place] para: Todos los que nacieron entre 1945 y [phone]. Todas las personas que tengan un riesgo de haber contrado hepatitis C. Enfermedades de transmisin sexual (ETS) Debe realizarse pruebas de deteccin de ITS todos los aos, incluidas la gonorrea y la clamidia, si: Es sexualmente activo y es menor de [address]. Es mayor de [address], y Public affairs consultant informa que corre riesgo de tener este tipo de infecciones. Arne Bevel sexual ha  cambiado desde que le hicieron la ltima prueba de deteccin y tiene un riesgo mayor de Warehouse manager clamidia o Copy. Pregntele al mdico si usted tiene riesgo. Pregntele al mdico si usted tiene un alto riesgo de Primary school teacher VIH. El mdico tambin puede recomendarle un medicamento recetado para ayudar a evitar la infeccin por el VIH. Si elige tomar medicamentos para prevenir el VIH, primero debe [employer] de  deteccin del VIH. Luego debe hacerse anlisis cada 3 meses mientras est tomando los medicamentos. Siga estas indicaciones en su casa: Consumo de alcohol No beba alcohol si el mdico se lo prohbe. Si bebe alcohol: Limite la cantidad que consume de 0 a 2 bebidas por da. Sepa cunta cantidad de alcohol hay en las bebidas que toma. En los [address], una medida equivale a una botella de cerveza de 12 oz (355 ml), un vaso de vino de 5 oz (148 ml) o un vaso de una bebida alcohlica de alta graduacin de 1 oz (44 ml). Estilo de vida No consuma ningn producto que contenga nicotina o tabaco. Estos productos incluyen cigarrillos, tabaco para Theatre manager y aparatos de vapeo, como los cigarrillos electrnicos. Si necesita ayuda para dejar de consumir estos productos, consulte al mdico. No consuma drogas. No comparta agujas. Solicite ayuda a su mdico si necesita apoyo o informacin para abandonar las drogas. Indicaciones generales Realcese los estudios de rutina de [address], dentales y de Wellsite geologist. Mantngase al da con las vacunas. Infrmele a su mdico si: Se siente deprimido con frecuencia. Alguna vez ha sido vctima de maltrato o no se siente seguro en su casa. Resumen Adoptar un estilo de vida saludable y recibir atencin preventiva son importantes para promover la salud y Counsellor. Siga las instrucciones del mdico acerca de una dieta saludable, el ejercicio y la realizacin de pruebas o exmenes para Hotel manager. Siga las instrucciones del mdico con respecto al control del colesterol y la presin arterial. Esta informacin no tiene Theme park manager el consejo del mdico. Asegrese de hacerle al mdico cualquier pregunta que tenga. Document Revised: 05/25/2020 Document Reviewed: 05/25/2020 Elsevier Patient Education  2024 Elsevier Inc.    Maryagnes Small, MD [facility]

## 2023-06-20 NOTE — Assessment & Plan Note (Signed)
 Diet and nutrition discussed Continue rosuvastatin  40 mg daily Lipid profile done today

## 2023-06-28 ENCOUNTER — Other Ambulatory Visit: Payer: Self-pay | Admitting: Radiology

## 2023-06-28 DIAGNOSIS — I700 Atherosclerosis of aorta: Secondary | ICD-10-CM

## 2023-06-28 DIAGNOSIS — I2510 Atherosclerotic heart disease of native coronary artery without angina pectoris: Secondary | ICD-10-CM

## 2023-06-28 MED ORDER — ROSUVASTATIN CALCIUM 40 MG PO TABS: 40.0000 mg | ORAL_TABLET | Freq: Every day | ORAL | 3 refills | Status: AC

## 2023-07-13 DIAGNOSIS — Z419 Encounter for procedure for purposes other than remedying health state, unspecified: Secondary | ICD-10-CM | POA: Diagnosis not present

## 2023-07-25 ENCOUNTER — Encounter: Payer: Self-pay | Admitting: Gastroenterology

## 2023-07-31 ENCOUNTER — Ambulatory Visit (AMBULATORY_SURGERY_CENTER)

## 2023-07-31 VITALS — Ht 63.0 in | Wt 166.8 lb

## 2023-07-31 DIAGNOSIS — Z1211 Encounter for screening for malignant neoplasm of colon: Secondary | ICD-10-CM

## 2023-07-31 MED ORDER — PEG 3350-KCL-NA BICARB-NACL 420 G PO SOLR: 4000.0000 mL | Freq: Once | ORAL | 0 refills | Status: AC

## 2023-07-31 MED ORDER — BISACODYL EC 5 MG PO TBEC: 5.0000 mg | DELAYED_RELEASE_TABLET | ORAL | 0 refills | Status: DC

## 2023-07-31 NOTE — Progress Notes (Signed)
 No egg or soy allergy known to patient  No issues known to pt with past sedation with any surgeries or procedures Patient denies ever being told they had issues or difficulty with intubation  No FH of Malignant Hyperthermia Pt is not on diet pills Pt is not on  home 02  Pt is not on blood thinners  Pt denies issues with constipation  No A fib or A flutter Have any cardiac testing pending--No Pt can ambulate  Pt denies use of chewing tobacco Discussed diabetic I weight loss medication holds Discussed NSAID holds Checked BMI Pt instructed to use Singlecare.com or GoodRx for a price reduction on prep  Patient's chart reviewed by Norleen Schillings CNRA prior to previsit and patient appropriate for the [facility].  Pre visit completed and red dot placed by patient's name on their procedure day (on provider's schedule).     Previsit completed with interpreter, Eric, with [facility]

## 2023-08-13 DIAGNOSIS — Z419 Encounter for procedure for purposes other than remedying health state, unspecified: Secondary | ICD-10-CM | POA: Diagnosis not present

## 2023-08-14 ENCOUNTER — Ambulatory Visit: Admitting: Gastroenterology

## 2023-08-14 ENCOUNTER — Encounter: Payer: Self-pay | Admitting: Gastroenterology

## 2023-08-14 VITALS — BP 131/85 | HR 62 | Temp 98.4°F | Resp 12 | Ht 63.0 in | Wt 166.8 lb

## 2023-08-14 DIAGNOSIS — Z1211 Encounter for screening for malignant neoplasm of colon: Secondary | ICD-10-CM

## 2023-08-14 MED ORDER — SODIUM CHLORIDE 0.9 % IV SOLN: 500.0000 mL | INTRAVENOUS | Status: AC

## 2023-08-14 NOTE — Progress Notes (Signed)
 [facility] History and Physical   Primary Care Physician:  Ethan [name], MD   Reason for Procedure:   Colon cancer screening  Plan:    Screening colonoscopy     HPI: Ethan Silva is a 58 y.o. male undergoing average risk screening colonoscopy.  He has no family history of colon cancer and no chronic GI symptoms.  He had a normal colonoscopy in 2015.   Past Medical History:  Diagnosis Date   Anxiety    Aortic stenosis, mild 07/2012   Very mild aortic stenosis noted on echo-mean gradient 11 mmHg.   Bile duct leak    CAD in native artery 07/14/2020   Small branch of OM 3 with 80% ostial stenosis.  Too small for PCI.  Otherwise questionable 30% LM and 30% PDA.   Chronic kidney disease 2017   left kidney stones   Headache(784.0)    Hx: of when BP is elevated   Hyperlipidemia    Hypertension    Non-STEMI (non-ST elevated myocardial infarction) (HCC) 07/13/2020   Admitted with hypertension and chest pain.  Cardiac cath showed small branch of an OM3 with 85% ostial stenosis not amenable to PCI.  No other significant disease noted.  Normal EF on echo.  Medical management.   Numbness and tingling in hands    Hx: of   Prediabetes     Past Surgical History:  Procedure Laterality Date   BILIARY DILATION  03/16/2021   Procedure: BILIARY DILATION;  Surgeon: Teressa Toribio SQUIBB, MD;  Location: [facility];  Service: Endoscopy;;   CHOLECYSTECTOMY N/A 07/11/2012   Procedure: LAPAROSCOPIC CHOLECYSTECTOMY WITH INTRAOPERATIVE CHOLANGIOGRAM;  Surgeon: Lynda Leos, MD;  Location: [facility] OR;  Service: General;  Laterality: N/A;   COLONOSCOPY     ENDOSCOPIC RETROGRADE CHOLANGIOPANCREATOGRAPHY (ERCP) WITH PROPOFOL  N/A 03/16/2021   Procedure: ENDOSCOPIC RETROGRADE CHOLANGIOPANCREATOGRAPHY (ERCP) WITH PROPOFOL ;  Surgeon: Teressa Toribio SQUIBB, MD;  Location: [facility];  Service: Endoscopy;  Laterality: N/A;   ERCP  07/16/2012   ERCP N/A 07/16/2012   Procedure:  ENDOSCOPIC RETROGRADE CHOLANGIOPANCREATOGRAPHY (ERCP);  Surgeon: Lamar JONETTA Aho, MD;  Location: [facility] OR;  Service: Gastroenterology;  Laterality: N/A;   ERCP N/A 10/06/2012   Procedure: ENDOSCOPIC RETROGRADE CHOLANGIOPANCREATOGRAPHY (ERCP);  Surgeon: Lamar JONETTA Aho, MD;  Location: [facility];  Service: Endoscopy;  Laterality: N/A;   ESOPHAGOGASTRODUODENOSCOPY N/A 03/16/2021   Procedure: ESOPHAGOGASTRODUODENOSCOPY (EGD);  Surgeon: Teressa Toribio SQUIBB, MD;  Location: [facility];  Service: Endoscopy;  Laterality: N/A;   EUS N/A 03/16/2021   Procedure: UPPER ENDOSCOPIC ULTRASOUND (EUS) RADIAL;  Surgeon: Teressa Toribio SQUIBB, MD;  Location: [facility];  Service: Endoscopy;  Laterality: N/A;   HAND SURGERY     LEFT HEART CATH AND CORONARY ANGIOGRAPHY N/A 07/14/2020   Procedure: LEFT HEART CATH AND CORONARY ANGIOGRAPHY;  Surgeon: Claudene Victory ORN, MD;  Location: [facility];  Service: Cardiovascular; ? Culprit Lesion ~ 80% ostial small OM3 (too small & ostial for PCI). ~30% mid LM (at a bend).  LAD with D1 & D2 - normal, RCA minimal luminal irregularities ~ 30%. PDA   SPHINCTEROTOMY  03/16/2021   Procedure: SPHINCTEROTOMY;  Surgeon: Teressa Toribio SQUIBB, MD;  Location: [facility];  Service: Endoscopy;;   TRANSTHORACIC ECHOCARDIOGRAM  07/14/2020   (NSTEMI/Accelerated Hypertension): EF 55 to 60%.  No RWM A.  Very mild Aortic Stenosis-mean gradient 11 to mmHg.    Prior to Admission medications   Medication Sig Start Date End Date Taking? Authorizing Provider  aspirin  81 MG EC  tablet Take 1 tablet (81 mg total) by mouth daily. Swallow whole. 07/15/20  Yes O'Neal, Darryle Ned, MD  chlorthalidone  (HYGROTON ) 25 MG tablet Take 1 tablet (25 mg total) by mouth daily. 03/19/23 08/14/23 Yes Anner Alm ORN, MD  lisinopril  (ZESTRIL ) 40 MG tablet Take 1 tablet (40 mg total) by mouth daily. 03/19/23 08/14/23 Yes Anner Alm ORN, MD  rosuvastatin  (CRESTOR ) 40 MG tablet Take 1 tablet (40 mg total) by mouth daily. 06/28/23  06/22/24 Yes [name], [name], MD  acetaminophen  (TYLENOL ) 325 MG tablet Take 325 mg by mouth every 6 (six) hours as needed for moderate pain. Patient not taking: Reported on 08/14/2023    [provider]  amLODipine  (NORVASC ) 10 MG tablet Take 1 tablet (10 mg total) by mouth daily. 03/19/23 07/31/23  Anner Alm ORN, MD  benzonatate  (TESSALON ) 200 MG capsule Take 1 capsule (200 mg total) by mouth 2 (two) times daily as needed for cough. Patient not taking: Reported on 06/20/2023 03/21/23   Ethan [name], MD  gabapentin  (NEURONTIN ) 300 MG capsule Take 1 capsule (300 mg total) by mouth 3 (three) times daily. Patient not taking: Reported on 06/20/2023 04/25/22 05/25/22  Ethan Ozell LABOR, MD  nitroGLYCERIN  (NITROSTAT ) 0.4 MG SL tablet Place 1 tablet (0.4 mg total) under the tongue every 5 (five) minutes x 3 doses as needed for chest pain. Patient not taking: Reported on 08/14/2023 06/20/23   Ethan [name], MD  pantoprazole  (PROTONIX ) 40 MG tablet Take 1 tablet by mouth once daily Patient not taking: Reported on 08/14/2023 05/14/22   Anner Alm ORN, MD    Current Outpatient Medications  Medication Sig Dispense Refill   aspirin  81 MG EC tablet Take 1 tablet (81 mg total) by mouth daily. Swallow whole. 90 tablet 3   chlorthalidone  (HYGROTON ) 25 MG tablet Take 1 tablet (25 mg total) by mouth daily. 90 tablet 3   lisinopril  (ZESTRIL ) 40 MG tablet Take 1 tablet (40 mg total) by mouth daily. 90 tablet 3   rosuvastatin  (CRESTOR ) 40 MG tablet Take 1 tablet (40 mg total) by mouth daily. 90 tablet 3   acetaminophen  (TYLENOL ) 325 MG tablet Take 325 mg by mouth every 6 (six) hours as needed for moderate pain. (Patient not taking: Reported on 08/14/2023)     amLODipine  (NORVASC ) 10 MG tablet Take 1 tablet (10 mg total) by mouth daily. 90 tablet 3   benzonatate  (TESSALON ) 200 MG capsule Take 1 capsule (200 mg total) by mouth 2 (two) times daily as needed for cough. (Patient not taking:  Reported on 06/20/2023) 20 capsule 0   gabapentin  (NEURONTIN ) 300 MG capsule Take 1 capsule (300 mg total) by mouth 3 (three) times daily. (Patient not taking: Reported on 06/20/2023) 90 capsule 0   nitroGLYCERIN  (NITROSTAT ) 0.4 MG SL tablet Place 1 tablet (0.4 mg total) under the tongue every 5 (five) minutes x 3 doses as needed for chest pain. (Patient not taking: Reported on 08/14/2023) 25 tablet 6   pantoprazole  (PROTONIX ) 40 MG tablet Take 1 tablet by mouth once daily (Patient not taking: Reported on 08/14/2023) 90 tablet 3   Current Facility-Administered Medications  Medication Dose Route Frequency Provider Last Rate Last Admin   0.9 %  sodium chloride  infusion  500 mL Intravenous Continuous Stacia Glendia BRAVO, MD        Allergies as of 08/14/2023   (No Known Allergies)    Family History  Problem Relation Age of Onset   Gallstones Mother    Hypertension Mother  Gallstones Brother    Diabetes Paternal Uncle    Colon cancer Neg Hx    Rectal cancer Neg Hx    Stomach cancer Neg Hx    Esophageal cancer Neg Hx     Social History   Socioeconomic History   Marital status: Married    Spouse name: Ragena   Number of children: 3   Years of education: 6th grade   Highest education level: Not on file  Occupational History   Occupation: Painting    Employer: [employer], [place], [place]    Comment: Mostly indoor painting  Tobacco Use   Smoking status: Never    Passive exposure: Never   Smokeless tobacco: Never  Vaping Use   Vaping status: Never Used  Substance and Sexual Activity   Alcohol use: No   Drug use: No   Sexual activity: Yes    Partners: Female  Other Topics Concern   Not on file  Social History Narrative   Originally from [place], [place]. Came to the [place]  in [place].   Lives with his wife and their youngest son   His daughter lives in [place], [place] with her husband.  Second son is now moved out of the house as well.   He gets a decent amount  of exercise walking around at work, but does not do routine exercise.  Does not drink or smoke   Social Drivers of Corporate investment banker Strain: Not on file  Food Insecurity: Not on file  Transportation Needs: Not on file  Physical Activity: Not on file  Stress: Not on file  Social Connections: Not on file  Intimate Partner Violence: Not on file    Review of Systems:  All other review of systems negative except as mentioned in the HPI.  Physical Exam: Vital signs BP 139/86   Pulse (!) 58   Temp 98.4 F (36.9 C)   Ht 5' 3 (1.6 m)   Wt 166 lb 12.8 oz (75.7 kg)   SpO2 94%   BMI 29.55 kg/m   General:   Alert,  Well-developed, well-nourished, pleasant and cooperative in NAD Airway:  Mallampati 2 Lungs:  Clear throughout to auscultation.   Heart:  Regular rate and rhythm; no murmurs, clicks, rubs,  or gallops. Abdomen:  Soft, nontender and nondistended. Normal bowel sounds.   Neuro/Psych:  Normal mood and affect. A and O x 3   Kamron Portee E. Stacia, MD [facility]

## 2023-08-14 NOTE — Progress Notes (Signed)
 Pt's states no medical or surgical changes since previsit or office visit.

## 2023-08-14 NOTE — Patient Instructions (Signed)
 USTED TUVO UN PROCEDIMIENTO ENDOSCPICO HOY EN EL [facility]:   Lea el informe del procedimiento que se le entreg para cualquier pregunta especfica sobre lo que se Dentist.  Si el informe del examen no responde a sus preguntas, por favor llame a su gastroenterlogo para aclararlo.  Si usted solicit que no se le den [employer] de lo que se Clinical cytogeneticist en su procedimiento al [employer] va a cuidar, entonces el informe del procedimiento se ha incluido en un sobre sellado para que usted lo revise despus cuando le sea ms conveniente.   LO QUE PUEDE ESPERAR: Algunas sensaciones de hinchazn en el abdomen.  Puede tener ms gases de lo normal.  El caminar puede ayudarle a eliminar el aire que se le puso en el tracto gastrointestinal durante el procedimiento y reducir la hinchazn.  Si le hicieron una endoscopia inferior (como una colonoscopia o una sigmoidoscopia flexible), podra notar manchas de sangre en las heces fecales o en el papel higinico.  Si se someti a una preparacin intestinal para su procedimiento, es posible que no tenga una evacuacin intestinal normal durante [employer].   Tenga en cuenta:  Es posible que note un poco de irritacin y congestin en la nariz o algn drenaje.  Esto es debido al oxgeno [employer] durante su procedimiento.  No hay que preocuparse y esto debe desaparecer ms o Regulatory affairs officer.   SNTOMAS PARA REPORTAR INMEDIATAMENTE:  Despus de una endoscopia inferior (colonoscopia o sigmoidoscopia flexible):  Cantidades excesivas de sangre en las heces fecales  Sensibilidad significativa o empeoramiento de los dolores abdominales   Hinchazn aguda del abdomen que antes no tena   Fiebre de 100F o ms   Para asuntos urgentes o de Associate Professor, puede comunicarse con un gastroenterlogo a cualquier hora llamando al [phone].  DIETA:  Recomendamos una comida pequea al principio, pero luego puede continuar con su dieta normal.  Tome  muchos lquidos, pero debe evitar las bebidas alcohlicas durante 24 horas.    ACTIVIDAD:  Debe planear tomarse las cosas con calma por el resto del da y no debe CONDUCIR ni usar maquinaria pesada Patent examiner (debido a los medicamentos de sedacin utilizados durante el examen).     SEGUIMIENTO: Nuestro personal llamar al nmero que aparece en su historial al siguiente da hbil de su procedimiento para ver cmo se siente y para responder cualquier pregunta o inquietud que pueda tener con respecto a la informacin que se le dio despus del procedimiento. Si no podemos contactarle, le dejaremos un mensaje.  Sin embargo, si se siente bien y no tiene English as a second language teacher, no es necesario que nos devuelva la llamada.  Asumiremos que ha regresado a sus actividades diarias normales sin incidentes. Si se le tomaron algunas biopsias, le contactaremos por telfono o por carta en las prximas 3 semanas.  Si no ha sabido [employer] biopsias en el transcurso de 3 semanas, por favor llmenos al [phone].   FIRMAS/CONFIDENCIALIDAD: Usted y/o el acompaante que le cuide han firmado documentos que se ingresarn en su historial mdico electrnico.  Estas firmas atestiguan el hecho de que la informacin anterior

## 2023-08-14 NOTE — Op Note (Signed)
 [facility] Patient Name: Ethan Silva Procedure Date: 08/14/2023 8:20 AM MRN: [id number] Endoscopist: Glendia E. Stacia , MD, [id number] Age: 58 Referring MD:  Date of Birth: 1965/07/17 Gender: Male Account #: [id number] Procedure:                Colonoscopy Indications:              Screening for colorectal malignant neoplasm (last                            colonoscopy was 10 years ago) Medicines:                Monitored Anesthesia Care Procedure:                Pre-Anesthesia Assessment:                           - Prior to the procedure, a History and Physical                            was performed, and patient medications and                            allergies were reviewed. The patient's tolerance of                            previous anesthesia was also reviewed. The risks                            and benefits of the procedure and the sedation                            options and risks were discussed with the patient.                            All questions were answered, and informed consent                            was obtained. Prior Anticoagulants: The patient has                            taken no anticoagulant or antiplatelet agents. ASA                            Grade Assessment: III - A patient with severe                            systemic disease. After reviewing the risks and                            benefits, the patient was deemed in satisfactory                            condition to undergo the procedure.  After obtaining informed consent, the colonoscope                            was passed under direct vision. Throughout the                            procedure, the patient's blood pressure, pulse, and                            oxygen saturations were monitored continuously. The                            Colonoscope was introduced through the anus and                            advanced to the  the cecum, identified by                            appendiceal orifice and ileocecal valve. The                            colonoscopy was performed without difficulty. The                            patient tolerated the procedure well. The quality                            of the bowel preparation was good. The ileocecal                            valve, appendiceal orifice, and rectum were                            photographed. The bowel preparation used was                            GoLYTELY  via split dose instruction. Scope In: 8:37:30 AM Scope Out: 8:49:09 AM Scope Withdrawal Time: 0 hours 8 minutes 44 seconds  Total Procedure Duration: 0 hours 11 minutes 39 seconds  Findings:                 The perianal and digital rectal examinations were                            normal. Pertinent negatives include normal                            sphincter tone and no palpable rectal lesions.                           The colon (entire examined portion) appeared normal.                           The retroflexed view of the distal rectum and anal  verge was normal and showed no anal or rectal                            abnormalities. Complications:            No immediate complications. Estimated Blood Loss:     Estimated blood loss: none. Impression:               - The entire examined colon is normal.                           - The distal rectum and anal verge are normal on                            retroflexion view.                           - No specimens collected. Recommendation:           - Patient has a contact number available for                            emergencies. The signs and symptoms of potential                            delayed complications were discussed with the                            patient. Return to normal activities tomorrow.                            Written discharge instructions were provided to the                             patient.                           - Resume previous diet.                           - Continue present medications.                           - Repeat colonoscopy in 10 years for screening                            purposes. Fabricio Endsley E. Stacia, MD 08/14/2023 8:53:12 AM This report has been signed electronically.

## 2023-08-14 NOTE — Progress Notes (Signed)
 Vss nad trans to pacu

## 2023-08-15 ENCOUNTER — Telehealth: Payer: Self-pay | Admitting: *Deleted

## 2023-08-15 NOTE — Telephone Encounter (Signed)
  Follow up Call-     08/14/2023    7:54 AM 01/24/2021    7:17 AM  Call back number  Post procedure Call Back phone  # [phone]  Permission to leave phone message Yes Yes     Patient questions:  Do you have a fever, pain , or abdominal swelling? No. Pain Score  0 *  Have you tolerated food without any problems? Yes.    Have you been able to return to your normal activities? Yes.    Do you have any questions about your discharge instructions: Diet   No. Medications  No. Follow up visit  No.  Do you have questions or concerns about your Care? No.  Actions: * If pain score is 4 or above: No action needed, pain <4.

## 2023-09-13 DIAGNOSIS — Z419 Encounter for procedure for purposes other than remedying health state, unspecified: Secondary | ICD-10-CM | POA: Diagnosis not present

## 2023-10-13 DIAGNOSIS — Z419 Encounter for procedure for purposes other than remedying health state, unspecified: Secondary | ICD-10-CM | POA: Diagnosis not present

## 2023-11-04 ENCOUNTER — Encounter: Payer: Self-pay | Admitting: Radiology

## 2023-11-13 DIAGNOSIS — Z419 Encounter for procedure for purposes other than remedying health state, unspecified: Secondary | ICD-10-CM | POA: Diagnosis not present

## 2023-11-19 ENCOUNTER — Encounter: Payer: Self-pay | Admitting: Oncology

## 2023-12-23 ENCOUNTER — Other Ambulatory Visit (HOSPITAL_COMMUNITY): Payer: Self-pay
